# Patient Record
Sex: Female | Born: 1965 | Race: White | Hispanic: No | Marital: Single | Smoking: Never smoker
Health system: Southern US, Community
[De-identification: ages and names within clinical notes are randomized; demographics above are authoritative.]

## PROBLEM LIST (undated history)

## (undated) DIAGNOSIS — C240 Malignant neoplasm of extrahepatic bile duct: Secondary | ICD-10-CM

## (undated) DIAGNOSIS — T7840XA Allergy, unspecified, initial encounter: Secondary | ICD-10-CM

## (undated) HISTORY — PX: LUNG BIOPSY: SHX232

## (undated) HISTORY — DX: Allergy, unspecified, initial encounter: T78.40XA

---

## 2006-06-18 ENCOUNTER — Ambulatory Visit: Payer: Self-pay | Admitting: Family Medicine

## 2008-11-02 ENCOUNTER — Ambulatory Visit: Payer: Self-pay | Admitting: Family Medicine

## 2008-11-05 ENCOUNTER — Ambulatory Visit: Payer: Self-pay | Admitting: Family Medicine

## 2008-11-08 ENCOUNTER — Ambulatory Visit: Payer: Self-pay | Admitting: Internal Medicine

## 2008-11-08 ENCOUNTER — Ambulatory Visit: Payer: Self-pay | Admitting: Family Medicine

## 2008-11-20 ENCOUNTER — Ambulatory Visit: Payer: Self-pay | Admitting: General Surgery

## 2008-11-21 ENCOUNTER — Ambulatory Visit: Payer: Self-pay | Admitting: General Surgery

## 2008-11-23 ENCOUNTER — Ambulatory Visit: Payer: Self-pay | Admitting: Internal Medicine

## 2009-02-23 DIAGNOSIS — D869 Sarcoidosis, unspecified: Secondary | ICD-10-CM

## 2009-02-23 HISTORY — DX: Sarcoidosis, unspecified: D86.9

## 2011-11-24 ENCOUNTER — Ambulatory Visit: Payer: Self-pay | Admitting: Family Medicine

## 2011-12-01 ENCOUNTER — Ambulatory Visit: Payer: Self-pay | Admitting: Family Medicine

## 2013-05-27 ENCOUNTER — Emergency Department: Payer: Self-pay | Admitting: Emergency Medicine

## 2013-05-27 LAB — COMPREHENSIVE METABOLIC PANEL
ALBUMIN: 3.6 g/dL (ref 3.4–5.0)
ALT: 21 U/L (ref 12–78)
ANION GAP: 9 (ref 7–16)
Alkaline Phosphatase: 53 U/L
BILIRUBIN TOTAL: 0.3 mg/dL (ref 0.2–1.0)
BUN: 19 mg/dL — AB (ref 7–18)
CALCIUM: 8.2 mg/dL — AB (ref 8.5–10.1)
Chloride: 109 mmol/L — ABNORMAL HIGH (ref 98–107)
Co2: 23 mmol/L (ref 21–32)
Creatinine: 1.22 mg/dL (ref 0.60–1.30)
EGFR (African American): >60
EGFR (Non-African Amer.): 52 — ABNORMAL LOW
GLUCOSE: 98 mg/dL (ref 65–99)
Osmolality: 283 (ref 275–301)
POTASSIUM: 3.3 mmol/L — AB (ref 3.5–5.1)
SGOT(AST): 28 U/L (ref 15–37)
SODIUM: 141 mmol/L (ref 136–145)
TOTAL PROTEIN: 7.3 g/dL (ref 6.4–8.2)

## 2013-05-27 LAB — ETHANOL
ETHANOL LVL: 319 mg/dL — AB
Ethanol %: 0.319 % (ref 0.000–0.080)

## 2013-05-27 LAB — ACETAMINOPHEN LEVEL

## 2013-05-27 LAB — CBC
HCT: 38.4 % (ref 35.0–47.0)
HGB: 12.7 g/dL (ref 12.0–16.0)
MCH: 32.2 pg (ref 26.0–34.0)
MCHC: 33.1 g/dL (ref 32.0–36.0)
MCV: 97 fL (ref 80–100)
Platelet: 207 10*3/uL (ref 150–440)
RBC: 3.95 10*6/uL (ref 3.80–5.20)
RDW: 13.3 % (ref 11.5–14.5)
WBC: 4.9 10*3/uL (ref 3.6–11.0)

## 2013-05-27 LAB — SALICYLATE LEVEL: Salicylates, Serum: <1.7 mg/dL

## 2013-05-27 LAB — HCG, QUANTITATIVE, PREGNANCY: Beta Hcg, Quant.: <1 m[IU]/mL — ABNORMAL LOW

## 2014-08-28 ENCOUNTER — Encounter: Payer: Self-pay | Admitting: Emergency Medicine

## 2014-08-28 ENCOUNTER — Ambulatory Visit
Admission: EM | Admit: 2014-08-28 | Discharge: 2014-08-28 | Disposition: A | Discharge status: Home / Self Care | Payer: BLUE CROSS/BLUE SHIELD | Attending: Family Medicine | Admitting: Family Medicine

## 2014-08-28 DIAGNOSIS — Z23 Encounter for immunization: Secondary | ICD-10-CM

## 2014-08-28 DIAGNOSIS — L03115 Cellulitis of right lower limb: Secondary | ICD-10-CM | POA: Diagnosis not present

## 2014-08-28 MED ORDER — CEFAZOLIN SODIUM 1 G IJ SOLR
1.0000 g | Freq: Once | INTRAMUSCULAR | Status: AC
  Administered 2014-08-28: 1 g via INTRAMUSCULAR

## 2014-08-28 MED ORDER — SULFAMETHOXAZOLE-TRIMETHOPRIM 800-160 MG PO TABS
1.0000 | ORAL_TABLET | Freq: Two times a day (BID) | ORAL | Status: DC
Start: 2014-08-28 — End: 2018-03-04

## 2014-08-28 MED ORDER — TETANUS-DIPHTH-ACELL PERTUSSIS 5-2.5-18.5 LF-MCG/0.5 IM SUSP
0.5000 mL | Freq: Once | INTRAMUSCULAR | Status: AC
  Administered 2014-08-28: 0.5 mL via INTRAMUSCULAR

## 2014-08-28 NOTE — ED Notes (Signed)
Patient states that she was working on her deck and piece of wood fell and hit her right lower leg.  Patient reports redness, swelling, and pain in her right lower leg.

## 2014-08-28 NOTE — Discharge Instructions (Signed)

## 2014-08-28 NOTE — ED Provider Notes (Signed)
CSN: [id number]     Arrival date & time 08/28/14  1620 History   First MD Initiated Contact with Patient 08/28/14 1655     Chief Complaint  Patient presents with  . Cellulitis  . Leg Pain  . Leg Swelling   (Consider location/radiation/quality/duration/timing/severity/associated sxs/prior Treatment) HPI Comments: 49 yo female with a 5 days h/o puncture wound to right lower leg (shin) area. Patient states that while working on her deck a piece of wood with a rusty nail fell and punctured her leg. Area now red and tender/painful.  Patient does not recall her last tetanus vaccine. Denies fevers, chills.   Patient is a 49 y.o. female presenting with leg pain. The history is provided by the patient.  Leg Pain   History reviewed. No pertinent past medical history. Past Surgical History  Procedure Laterality Date  . Cesarean section     History reviewed. No pertinent family history. History  Substance Use Topics  . Smoking status: Never Smoker   . Smokeless tobacco: Never Used  . Alcohol Use: Yes   OB History    No data available     Review of Systems  Allergies  Review of patient's allergies indicates no known allergies.  Home Medications   Prior to Admission medications   Medication Sig Start Date End Date Taking? Authorizing Provider  sulfamethoxazole-trimethoprim (BACTRIM DS,SEPTRA DS) 800-160 MG per tablet Take 1 tablet by mouth 2 (two) times daily. 08/28/14   Payton Mccallumrlando Arrion Broaddus, MD   BP 145/91 mmHg  Pulse 64  Temp(Src) 97.7 F (36.5 C) (Tympanic)  Resp 16  Ht 5\' 9"  (1.753 m)  Wt 204 lb (92.534 kg)  BMI 30.11 kg/m2  SpO2 100%  LMP 08/21/2014 (Approximate) Physical Exam  Constitutional: She appears well-developed and well-nourished. No distress.  Skin: She is not diaphoretic. There is erythema.  Right lower extremity neurovascularly intact; skin area 4x5 cm with tenderness, blanchable erythema and central puncture wound with mild purulent drainage  Nursing note and vitals  reviewed.   ED Course  Procedures (including critical care time) Labs Review Labs Reviewed - No data to display  Imaging Review No results found.   MDM   1. Cellulitis of right lower extremity     New Prescriptions   SULFAMETHOXAZOLE-TRIMETHOPRIM (BACTRIM DS,SEPTRA DS) 800-160 MG PER TABLET    Take 1 tablet by mouth 2 (two) times daily.  Plan: 1.  diagnosis reviewed with patient 2. rx as per orders; risks, benefits, potential side effects reviewed with patient 3. Patient given Ancef 1gm IM x 1 and tetanus vaccine in clinic 4. Recommend supportive treatment with elevation, warm compresses, monitor 5. F/u in 2-3 days with PCP or here for recheck or  prn if symptoms worsening  Payton Mccallumrlando Rowe Warman, MD 08/28/14 1728

## 2018-03-04 ENCOUNTER — Ambulatory Visit (INDEPENDENT_AMBULATORY_CARE_PROVIDER_SITE_OTHER): Payer: Self-pay | Admitting: Physician Assistant

## 2018-03-04 ENCOUNTER — Encounter: Payer: Self-pay | Admitting: Physician Assistant

## 2018-03-04 VITALS — BP 130/90 | HR 69 | Temp 98.1°F | Resp 16 | Ht 68.0 in | Wt 214.0 lb

## 2018-03-04 DIAGNOSIS — H6123 Impacted cerumen, bilateral: Secondary | ICD-10-CM

## 2018-03-04 DIAGNOSIS — H6691 Otitis media, unspecified, right ear: Secondary | ICD-10-CM

## 2018-03-04 DIAGNOSIS — R059 Cough, unspecified: Secondary | ICD-10-CM

## 2018-03-04 DIAGNOSIS — R05 Cough: Secondary | ICD-10-CM

## 2018-03-04 DIAGNOSIS — J0100 Acute maxillary sinusitis, unspecified: Secondary | ICD-10-CM

## 2018-03-04 MED ORDER — PSEUDOEPH-BROMPHEN-DM 30-2-10 MG/5ML PO SYRP
5.0000 mL | ORAL_SOLUTION | Freq: Four times a day (QID) | ORAL | 0 refills | Status: DC | PRN
Start: 2018-03-04 — End: 2019-02-27

## 2018-03-04 MED ORDER — FLUTICASONE PROPIONATE 50 MCG/ACT NA SUSP
2.0000 | Freq: Every day | NASAL | 0 refills | Status: DC
Start: 2018-03-04 — End: 2019-02-27

## 2018-03-04 MED ORDER — AMOXICILLIN-POT CLAVULANATE 875-125 MG PO TABS: 1.0000 | ORAL_TABLET | Freq: Two times a day (BID) | ORAL | 0 refills | Status: AC

## 2018-03-04 NOTE — Progress Notes (Signed)
Patient ID: Vicki Phillips DOB: 1965/07/23 AGE: 53 y.o. MRN: [id number]   PCP: No primary care provider on file.   Chief Complaint:  Chief Complaint  Patient presents with  . Sinusitis    3wk  . Nasal Congestion    3wk  . Otalgia    1wk     Subjective:    HPI:  Vicki Phillips is a 53 y.o. female presents for evaluation  Chief Complaint  Patient presents with  . Sinusitis    3wk  . Nasal Congestion    3wk  . Otalgia    4wk   53 year old female presents to [facility] with 4 week history of nasal congestion and bilateral maxillary sinus congestion. Persistent. Began with rhinorrhea and sneezing. Mucous was thin and clear; now thick and green. Past 1-2 weeks has developed chest congestion with coarse/productive cough. Associated wheezing. Cough more severe when exercising. Patient also reports one week history of clogged right ear. Muffled hearing. Regularly uses ear pods. Patient with previous history of cerumen impaction, required ear irrigation several years ago. Patient denies regular use of Q-tips; however, few days ago tried to clean right ear canal with Q-tip, no cerumen. Patient with seasonal allergies. Most severe in Spring season; pollen and grass. Takes Claritin daily. Does not use nasal sprays. Has never been prescribed Singulair. Denies previous history of recurrent otitis media or previous tympanoplasty procedure. Patient has taken OTC Allegra, Theraflu, and Tylenol this past month for current symptoms; no significant symptom improvement. Patient denies smoking history. Denies asthma diagnosis. Patient has been diagnosed with sarcoidosis; not regularly seen by pulmonologist or rheumatologist. Patient states her last antibiotic was most likely one year ago, for sinusitis. Patient states her PCP is [facility] in [place]; however, has not been seen since 2015. Patient states work does provide biometric physicals on-site annually; and all of her vital  signs and blood work (suspect cholesterol panel and glucose) have been WNL. Patient denies fever, sweats, chills, headache, ear discharge/drainage, tinnitus, sore throat, chest pain, SOB, nausea/vomiting, diarrhea.  A limited review of symptoms was performed, pertinent positives and negatives as mentioned in HPI.  The following portions of the patient's history were reviewed and updated as appropriate: allergies, current medications and past medical history.  There are no active problems to display for this patient.   No Known Allergies  Current Outpatient Medications on File Prior to Visit  Medication Sig Dispense Refill  . loratadine (CLARITIN) 10 MG tablet Take by mouth.     No current facility-administered medications on file prior to visit.        Objective:   Vitals:   03/04/18 1558  BP: 130/90  Pulse: 69  Resp: 16  Temp: 98.1 F (36.7 C)  SpO2: 98%     Wt Readings from Last 3 Encounters:  03/04/18 214 lb (97.1 kg)  08/28/14 204 lb (92.5 kg)    Physical Exam:   General Appearance:  Patient sitting comfortably on examination table. Conversational. Peri Jefferson self-historian. In no acute distress. Afebrile.   Head:  Normocephalic, without obvious abnormality, atraumatic  Eyes:  PERRL, conjunctiva/corneas clear, EOM's intact  Ears:  Bilateral ear canals with significant cerumen; obstruction of canal on right side. Left TM pearly shiny with good light reflex. No erythema or injection. Right TM unable to be visualized.  Nose: Nares normal, septum midline. Nasal mucosa with bilateral edema, scant clear rhinorrhea. No sinus tenderness with percussion/palpation.  Throat: Lips, mucosa, and tongue normal; teeth and gums normal.  Throat reveals no erythema. Tonsils with no enlargement or exudate.  Neck: Supple, symmetrical, trachea midline, no lymphadenopathy  Lungs:   Clear to auscultation bilaterally, respirations unlabored. Good aeration. No wheezing. Forced expiration triggers  coarse/junky sounding cough. No audible rhonchi.  Heart:  Regular rate and rhythm, S1 and S2 normal, no murmur, rub, or gallop  Extremities: Extremities normal, atraumatic, no cyanosis or edema  Pulses: 2+ and symmetric  Skin: Skin color, texture, turgor normal, no rashes or lesions  Lymph nodes: Cervical, supraclavicular, and axillary nodes normal  Neurologic: Normal    Assessment & Plan:    Exam findings, diagnosis etiology and medication use and indications reviewed with patient. Follow-Up and discharge instructions provided. No emergent/urgent issues found on exam.  Patient education was provided.   Patient verbalized understanding of information provided and agrees with plan of care (POC), all questions answered. The patient is advised to call or return to clinic if condition does not see an improvement in symptoms, or to seek the care of the closest emergency department if condition worsens with the below plan.    Patient underwent bilateral ear irrigation. Status post procedure, left ear canal free of ear wax. Canal without erythema or edema. Left TM still WNL. Right ear canal clear of ear wax. Canal without erythema or edema. Right TM with dull appearance, serous effusion, and injection. No evidence of perforation.  1. Excessive ear wax, bilateral  2. Acute non-recurrent maxillary sinusitis - fluticasone (FLONASE) 50 MCG/ACT nasal spray; Place 2 sprays into both nostrils daily.  Dispense: 16 g; Refill: 0  3. Acute right otitis media - amoxicillin-clavulanate (AUGMENTIN) 875-125 MG tablet; Take 1 tablet by mouth 2 (two) times daily for 7 days.  Dispense: 14 tablet; Refill: 0  4. Cough - brompheniramine-pseudoephedrine-DM 30-2-10 MG/5ML syrup; Take 5 mLs by mouth 4 (four) times daily as needed.  Dispense: 120 mL; Refill: 0   Patient with 4 week history of nasal congestion and sinus pressure. 2 week history of chest congestion and cough (in addition to continued sinus symptoms)  and right ear clogged/muffled hearing. Patient with seasonal allergies, sinusitis (due to duration, warrants antibiotic), and right AOM. Prescribed 7-day course of Augmentin, Flonase nasal spray, and Bromphed cough syrup. Advised patient follow-up with PCP or UC in one week if symptoms not improving, sooner with any worsening symptoms.   [name], MHS, PA-C Rulon Sera, MHS, PA-C Advanced Practice Provider [facility]  [address], [facility], [address]st Floor [place], [place] [address] (p):  [phone] [email or web address]

## 2018-03-04 NOTE — Patient Instructions (Addendum)
Thank you for choosing [facility] for your health care needs.  You have been diagnosed with sinusitis (a sinus infection). You have been prescribed an antibiotic, Augmentin.  You have also been prescribed a steroid nasal spray, Flonase, for nasal congestion and post-nasal drip and sinus pressure/congestion. You have also been prescribed Bromphed cough syrup, for cough, will also help with nasal and sinus congestion/pressure.  Increase fluids. Rest. May use saline nasal spray or NettiPot. May use over the counter Tylenol or ibuprofen for pain/discomfort.  You had significant amount of ear wax in both ear canals. You underwent ear irrigation (flushing of the ears) to remove the ear wax. You may use over the counter Debrox ear drops in the future, to help prevent accumulation of ear wax.  Hope you feel better soon! Follow-up with your family physician or urgent care in one week if symptoms not improving, sooner with any worsening symptoms.  Sinusitis, Adult Sinusitis is soreness and swelling (inflammation) of your sinuses. Sinuses are hollow spaces in the bones around your face. They are located:  Around your eyes.  In the middle of your forehead.  Behind your nose.  In your cheekbones. Your sinuses and nasal passages are lined with a fluid called mucus. Mucus drains out of your sinuses. Swelling can trap mucus in your sinuses. This lets germs (bacteria, virus, or fungus) grow, which leads to infection. Most of the time, this condition is caused by a virus. What are the causes? This condition is caused by:  Allergies.  Asthma.  Germs.  Things that block your nose or sinuses.  Growths in the nose (nasal polyps).  Chemicals or irritants in the air.  Fungus (rare). What increases the risk? You are more likely to develop this condition if:  You have a weak body defense system (immune system).  You do a lot of swimming or diving.  You use nasal sprays too much.  You  smoke. What are the signs or symptoms? The main symptoms of this condition are pain and a feeling of pressure around the sinuses. Other symptoms include:  Stuffy nose (congestion).  Runny nose (drainage).  Swelling and warmth in the sinuses.  Headache.  Toothache.  A cough that may get worse at night.  Mucus that collects in the throat or the back of the nose (postnasal drip).  Being unable to smell and taste.  Being very tired (fatigue).  A fever.  Sore throat.  Bad breath. How is this diagnosed? This condition is diagnosed based on:  Your symptoms.  Your medical history.  A physical exam.  Tests to find out if your condition is short-term (acute) or long-term (chronic). Your doctor may: ? Check your nose for growths (polyps). ? Check your sinuses using a tool that has a light (endoscope). ? Check for allergies or germs. ? Do imaging tests, such as an MRI or CT scan. How is this treated? Treatment for this condition depends on the cause and whether it is short-term or long-term.  If caused by a virus, your symptoms should go away on their own within 10 days. You may be given medicines to relieve symptoms. They include: ? Medicines that shrink swollen tissue in the nose. ? Medicines that treat allergies (antihistamines). ? A spray that treats swelling of the nostrils. ? Rinses that help get rid of thick mucus in your nose (nasal saline washes).  If caused by bacteria, your doctor may wait to see if you will get better without treatment. You may be  given antibiotic medicine if you have: ? A very bad infection. ? A weak body defense system.  If caused by growths in the nose, you may need to have surgery. Follow these instructions at home: Medicines  Take, use, or apply over-the-counter and prescription medicines only as told by your doctor. These may include nasal sprays.  If you were prescribed an antibiotic medicine, take it as told by your doctor. Do not  stop taking the antibiotic even if you start to feel better. Hydrate and humidify   Drink enough water to keep your pee (urine) pale yellow.  Use a cool mist humidifier to keep the humidity level in your home above 50%.  Breathe in steam for 10-15 minutes, 3-4 times a day, or as told by your doctor. You can do this in the bathroom while a hot shower is running.  Try not to spend time in cool or dry air. Rest  Rest as much as you can.  Sleep with your head raised (elevated).  Make sure you get enough sleep each night. General instructions   Put a warm, moist washcloth on your face 3-4 times a day, or as often as told by your doctor. This will help with discomfort.  Wash your hands often with soap and water. If there is no soap and water, use hand sanitizer.  Do not smoke. Avoid being around people who are smoking (secondhand smoke).  Keep all follow-up visits as told by your doctor. This is important. Contact a doctor if:  You have a fever.  Your symptoms get worse.  Your symptoms do not get better within 10 days. Get help right away if:  You have a very bad headache.  You cannot stop throwing up (vomiting).  You have very bad pain or swelling around your face or eyes.  You have trouble seeing.  You feel confused.  Your neck is stiff.  You have trouble breathing. Summary  Sinusitis is swelling of your sinuses. Sinuses are hollow spaces in the bones around your face.  This condition is caused by tissues in your nose that become inflamed or swollen. This traps germs. These can lead to infection.  If you were prescribed an antibiotic medicine, take it as told by your doctor. Do not stop taking it even if you start to feel better.  Keep all follow-up visits as told by your doctor. This is important. This information is not intended to replace advice given to you by your health care provider. Make sure you discuss any questions you have with your health care  provider. Document Released: 07/29/2007 Document Revised: 07/12/2017 Document Reviewed: 07/12/2017 Elsevier Interactive Patient Education  2019 [employer].

## 2018-03-07 ENCOUNTER — Telehealth: Payer: Self-pay | Admitting: Emergency Medicine

## 2018-03-07 NOTE — Telephone Encounter (Signed)
Left message following up on visit with [facility] 

## 2018-11-24 LAB — HM MAMMOGRAPHY

## 2019-02-27 ENCOUNTER — Encounter: Payer: Self-pay | Admitting: Emergency Medicine

## 2019-02-27 ENCOUNTER — Other Ambulatory Visit: Payer: Self-pay

## 2019-02-27 ENCOUNTER — Ambulatory Visit
Admission: EM | Admit: 2019-02-27 | Discharge: 2019-02-27 | Disposition: A | Discharge status: Home / Self Care | Payer: No Typology Code available for payment source | Attending: Internal Medicine | Admitting: Internal Medicine

## 2019-02-27 DIAGNOSIS — Z79899 Other long term (current) drug therapy: Secondary | ICD-10-CM | POA: Insufficient documentation

## 2019-02-27 DIAGNOSIS — Z20822 Contact with and (suspected) exposure to covid-19: Secondary | ICD-10-CM | POA: Diagnosis not present

## 2019-02-27 DIAGNOSIS — D869 Sarcoidosis, unspecified: Secondary | ICD-10-CM | POA: Insufficient documentation

## 2019-02-27 DIAGNOSIS — R05 Cough: Secondary | ICD-10-CM | POA: Diagnosis present

## 2019-02-27 DIAGNOSIS — R0602 Shortness of breath: Secondary | ICD-10-CM | POA: Diagnosis not present

## 2019-02-27 DIAGNOSIS — R059 Cough, unspecified: Secondary | ICD-10-CM

## 2019-02-27 NOTE — ED Provider Notes (Signed)
[facility]    CSN: [id number] Arrival date & time: 02/27/19  1611      History   Chief Complaint Chief Complaint  Patient presents with  . Cough  . Shortness of Breath    HPI Vicki Phillips is a 54 y.o. female with a history of sarcoidosis comes to urgent care with a 3-day history of nonproductive cough with some shortness of breath.  Patient has been out of work for 12 days and recently started having symptoms.  She denies any fever or chills.  No chest pain or chest pressure.  No nausea or vomiting.  Patient is requesting Covid testing prior to returning to work.  No sick contacts recently.   HPI  Past Medical History:  Diagnosis Date  . Sarcoidosis     There are no problems to display for this patient.   Past Surgical History:  Procedure Laterality Date  . CESAREAN SECTION    . LUNG BIOPSY      OB History   No obstetric history on file.      Home Medications    Prior to Admission medications   Medication Sig Start Date End Date Taking? Authorizing Provider  loratadine (CLARITIN) 10 MG tablet Take by mouth.   Yes [provider]  fluticasone (FLONASE) 50 MCG/ACT nasal spray Place 2 sprays into both nostrils daily. 03/04/18 02/27/19  Darlin Priestly, PA-C    Family History Family History  Problem Relation Age of Onset  . Stroke Mother   . Heart attack Father     Social History Social History   Tobacco Use  . Smoking status: Never Smoker  . Smokeless tobacco: Never Used  Substance Use Topics  . Alcohol use: Yes  . Drug use: No     Allergies   Patient has no known allergies.   Review of Systems Review of Systems  Constitutional: Negative for activity change, chills, fatigue and fever.  HENT: Positive for congestion.   Respiratory: Positive for cough. Negative for shortness of breath and wheezing.   Cardiovascular: Negative for chest pain and palpitations.  Gastrointestinal: Negative for diarrhea, nausea and  vomiting.  Musculoskeletal: Negative for arthralgias and myalgias.  Skin: Negative for color change and wound.     Physical Exam Triage Vital Signs ED Triage Vitals  Enc Vitals Group     BP 02/27/19 1629 (!) 145/90     Pulse Rate 02/27/19 1629 63     Resp 02/27/19 1629 18     Temp 02/27/19 1629 98.3 F (36.8 C)     Temp Source 02/27/19 1629 Oral     SpO2 02/27/19 1629 99 %     Weight 02/27/19 1624 190 lb (86.2 kg)     Height 02/27/19 1624 5\' 9"  (1.753 m)     Head Circumference --      Peak Flow --      Pain Score 02/27/19 1624 4     Pain Loc --      Pain Edu? --      Excl. in [place]? --    No data found.  Updated Vital Signs BP (!) 145/90 (BP Location: Right Arm)   Pulse 63   Temp 98.3 F (36.8 C) (Oral)   Resp 18   Ht 5\' 9"  (1.753 m)   Wt 86.2 kg   SpO2 99%   BMI 28.06 kg/m   Visual Acuity Right Eye Distance:   Left Eye Distance:   Bilateral Distance:    Right Eye Near:  Left Eye Near:    Bilateral Near:     Physical Exam Constitutional:      General: She is not in acute distress.    Appearance: She is well-developed. She is not ill-appearing.  Pulmonary:     Effort: Pulmonary effort is normal.     Breath sounds: Normal breath sounds. No decreased breath sounds, wheezing or rhonchi.  Neurological:     Mental Status: She is alert.      UC Treatments / Results  Labs (all labs ordered are listed, but only abnormal results are displayed) Labs Reviewed  NOVEL CORONAVIRUS, NAA (HOSP ORDER, SEND-OUT TO REF LAB; TAT 18-24 HRS)    EKG   Radiology No results found.  Procedures Procedures (including critical care time)  Medications Ordered in UC Medications - No data to display  Initial Impression / Assessment and Plan / UC Course  I have reviewed the triage vital signs and the nursing notes.  Pertinent labs & imaging results that were available during my care of the patient were reviewed by me and considered in my medical decision making (see  chart for details).     1.  Cough: COVID-19 testing. Patient is advised self isolate Test results are available If patient has worsening symptoms she is advised to return to urgent care to be reevaluated Final Clinical Impressions(s) / UC Diagnoses   Final diagnoses:  Cough   Discharge Instructions   None    ED Prescriptions    None     PDMP not reviewed this encounter.   Merrilee Jansky, MD 02/27/19 1723

## 2019-02-27 NOTE — ED Triage Notes (Signed)
Pt c/o cough, shortness of breath, "low grade fever", and fatigue. Started about 3 days ago. She states that her son had covid about a month ago.

## 2019-02-28 LAB — NOVEL CORONAVIRUS, NAA (HOSP ORDER, SEND-OUT TO REF LAB; TAT 18-24 HRS): SARS-CoV-2, NAA: NOT DETECTED

## 2019-06-27 ENCOUNTER — Ambulatory Visit: Payer: Self-pay | Admitting: Internal Medicine

## 2019-06-30 ENCOUNTER — Encounter: Payer: Self-pay | Admitting: Internal Medicine

## 2019-06-30 DIAGNOSIS — D869 Sarcoidosis, unspecified: Secondary | ICD-10-CM | POA: Insufficient documentation

## 2019-06-30 DIAGNOSIS — J309 Allergic rhinitis, unspecified: Secondary | ICD-10-CM | POA: Insufficient documentation

## 2019-06-30 DIAGNOSIS — G4700 Insomnia, unspecified: Secondary | ICD-10-CM | POA: Insufficient documentation

## 2019-07-03 ENCOUNTER — Encounter: Payer: Self-pay | Admitting: Internal Medicine

## 2019-07-03 ENCOUNTER — Other Ambulatory Visit: Payer: Self-pay

## 2019-07-03 ENCOUNTER — Ambulatory Visit (INDEPENDENT_AMBULATORY_CARE_PROVIDER_SITE_OTHER): Payer: No Typology Code available for payment source | Admitting: Internal Medicine

## 2019-07-03 VITALS — BP 128/78 | HR 75 | Temp 97.7°F | Ht 69.0 in | Wt 215.0 lb

## 2019-07-03 DIAGNOSIS — D869 Sarcoidosis, unspecified: Secondary | ICD-10-CM | POA: Diagnosis not present

## 2019-07-03 DIAGNOSIS — F5101 Primary insomnia: Secondary | ICD-10-CM | POA: Diagnosis not present

## 2019-07-03 DIAGNOSIS — J3089 Other allergic rhinitis: Secondary | ICD-10-CM | POA: Diagnosis not present

## 2019-07-03 NOTE — Progress Notes (Signed)
Date:  07/03/2019   Name:  Vicki Phillips   DOB:  1966-01-23   MRN:  [id number]   Chief Complaint: [place] (Had mammogram in [facility] in [place]. 11/2018) Menopause about 3 years ago - some hot flashes and mood changes which are much better now. Last Pap about 10 yrs ago. No prior colonoscopy. She needs to schedule a CPX for employer insurance discount  Mammogram 11/2018 Pap - ? Colonoscopy - none Immunization History  Administered Date(s) Administered  . Influenza-Unspecified 11/13/2012  . PFIZER SARS-COV-2 Vaccination 05/08/2019, 05/29/2019  . Tdap 08/28/2014    HPI  Lab Results  Component Value Date   CREATININE 1.22 05/27/2013   BUN 19 (H) 05/27/2013   NA 141 05/27/2013   K 3.3 (L) 05/27/2013   CL 109 (H) 05/27/2013   CO2 23 05/27/2013    Lab Results  Component Value Date   WBC 4.9 05/27/2013   HGB 12.7 05/27/2013   HCT 38.4 05/27/2013   MCV 97 05/27/2013   PLT 207 05/27/2013   Lab Results  Component Value Date   ALT 21 05/27/2013   AST 28 05/27/2013   ALKPHOS 53 05/27/2013   BILITOT 0.3 05/27/2013     Review of Systems  Constitutional: Negative for chills, fatigue and fever.  Respiratory: Negative for cough, chest tightness, shortness of breath and wheezing.   Cardiovascular: Negative for chest pain.  Genitourinary: Negative for menstrual problem.  Neurological: Negative for dizziness and headaches.  Psychiatric/Behavioral: Positive for sleep disturbance. Negative for dysphoric mood. The patient is not nervous/anxious.     Patient Active Problem List   Diagnosis Date Noted  . Allergic rhinitis 06/30/2019  . Insomnia 06/30/2019  . Sarcoid 06/30/2019    No Known Allergies  Past Surgical History:  Procedure Laterality Date  . CESAREAN SECTION     x 2  . LUNG BIOPSY      Social History   Tobacco Use  . Smoking status: Never Smoker  . Smokeless tobacco: Never Used  Substance Use Topics  . Alcohol use: Yes   Alcohol/week: 10.0 standard drinks    Types: 10 Standard drinks or equivalent per week  . Drug use: No     Medication list has been reviewed and updated.  Current Meds  Medication Sig  . loratadine (CLARITIN) 10 MG tablet Take by mouth.    PHQ 2/9 Scores 07/03/2019  PHQ - 2 Score 0  PHQ- 9 Score 5   GAD 7 : Generalized Anxiety Score 07/03/2019  Nervous, Anxious, on Edge 2  Control/stop worrying 2  Worry too much - different things 2  Trouble relaxing 3  Restless 2  Easily annoyed or irritable 0  Afraid - awful might happen 1  Total GAD 7 Score 12  Anxiety Difficulty Not difficult at all    BP Readings from Last 3 Encounters:  07/03/19 128/78  02/27/19 (!) 145/90  03/04/18 130/90    Physical Exam Constitutional:      Appearance: Normal appearance.  Cardiovascular:     Rate and Rhythm: Normal rate and regular rhythm.     Pulses: Normal pulses.     Heart sounds: No murmur.  Pulmonary:     Effort: Pulmonary effort is normal.  Musculoskeletal:     Cervical back: Normal range of motion.     Right lower leg: No edema.     Left lower leg: No edema.  Lymphadenopathy:     Cervical: No cervical adenopathy.  Skin:  General: Skin is warm.  Neurological:     Mental Status: She is alert.  Psychiatric:        Mood and Affect: Mood normal.        Behavior: Behavior normal.     Wt Readings from Last 3 Encounters:  07/03/19 215 lb (97.5 kg)  02/27/19 190 lb (86.2 kg)  03/04/18 214 lb (97.1 kg)    BP 128/78   Pulse 75   Temp 97.7 F (36.5 C) (Temporal)   Ht 5\' 9"  (1.753 m)   Wt 215 lb (97.5 kg)   SpO2 98%   BMI 31.75 kg/m   Assessment and Plan: 1. Environmental and seasonal allergies Continue Claritin daily as needed  2. Sarcoid Asymptomatic No recent evaluation done or indicated  3. Primary insomnia Has uses Ambien but does not need any medications at this time  HM - Mammogram has been done in the past year. She will return for CPX, Pap and labs in  the near future  Partially dictated using . Any errors are unintentional.  Animal nutritionist, MD [facility]  07/03/2019

## 2019-07-31 ENCOUNTER — Encounter: Payer: Self-pay | Admitting: Internal Medicine

## 2019-07-31 ENCOUNTER — Ambulatory Visit (INDEPENDENT_AMBULATORY_CARE_PROVIDER_SITE_OTHER): Payer: No Typology Code available for payment source | Admitting: Internal Medicine

## 2019-07-31 ENCOUNTER — Other Ambulatory Visit: Payer: Self-pay

## 2019-07-31 ENCOUNTER — Other Ambulatory Visit (HOSPITAL_COMMUNITY)
Admission: RE | Admit: 2019-07-31 | Discharge: 2019-07-31 | Disposition: A | Discharge status: Home / Self Care | Payer: No Typology Code available for payment source | Source: Ambulatory Visit | Attending: Internal Medicine | Admitting: Internal Medicine

## 2019-07-31 VITALS — BP 136/90 | HR 71 | Temp 98.1°F | Ht 69.0 in | Wt 213.0 lb

## 2019-07-31 DIAGNOSIS — Z1231 Encounter for screening mammogram for malignant neoplasm of breast: Secondary | ICD-10-CM

## 2019-07-31 DIAGNOSIS — Z1211 Encounter for screening for malignant neoplasm of colon: Secondary | ICD-10-CM

## 2019-07-31 DIAGNOSIS — Z124 Encounter for screening for malignant neoplasm of cervix: Secondary | ICD-10-CM

## 2019-07-31 DIAGNOSIS — Z0000 Encounter for general adult medical examination without abnormal findings: Secondary | ICD-10-CM

## 2019-07-31 LAB — POCT URINALYSIS DIPSTICK
Bilirubin, UA: NEGATIVE
Blood, UA: NEGATIVE
Glucose, UA: NEGATIVE
Ketones, UA: NEGATIVE
Leukocytes, UA: NEGATIVE
Nitrite, UA: NEGATIVE
Protein, UA: NEGATIVE
Spec Grav, UA: 1.015 (ref 1.010–1.025)
Urobilinogen, UA: 0.2 E.U./dL
pH, UA: 6 (ref 5.0–8.0)

## 2019-07-31 NOTE — Progress Notes (Signed)
Date:  07/31/2019   Name:  Vicki Phillips   DOB:  28-May-1965   MRN:  [id number]   Chief Complaint: Annual Exam (breast exam with pap / colonoscopy) Vicki Phillips is a 54 y.o. female who presents today for her Complete Annual Exam. She feels well. She reports exercising working at home. She reports she is sleeping well.   Mammogram  11/2018 Pap 2002 Colonoscopy - none Immunization History  Administered Date(s) Administered  . Influenza-Unspecified 11/13/2012  . PFIZER SARS-COV-2 Vaccination 05/08/2019, 05/29/2019  . Tdap 08/28/2014    HPI  Lab Results  Component Value Date   CREATININE 1.22 05/27/2013   BUN 19 (H) 05/27/2013   NA 141 05/27/2013   K 3.3 (L) 05/27/2013   CL 109 (H) 05/27/2013   CO2 23 05/27/2013   No results found for: CHOL, HDL, LDLCALC, LDLDIRECT, TRIG, CHOLHDL No results found for: TSH No results found for: HGBA1C Lab Results  Component Value Date   WBC 4.9 05/27/2013   HGB 12.7 05/27/2013   HCT 38.4 05/27/2013   MCV 97 05/27/2013   PLT 207 05/27/2013   Lab Results  Component Value Date   ALT 21 05/27/2013   AST 28 05/27/2013   ALKPHOS 53 05/27/2013   BILITOT 0.3 05/27/2013     Review of Systems  Constitutional: Negative for chills, fatigue and fever.  HENT: Negative for congestion, hearing loss, tinnitus, trouble swallowing and voice change.   Eyes: Negative for visual disturbance.  Respiratory: Negative for cough, chest tightness, shortness of breath and wheezing.   Cardiovascular: Negative for chest pain, palpitations and leg swelling.  Gastrointestinal: Negative for abdominal pain, constipation, diarrhea and vomiting.  Endocrine: Negative for polydipsia and polyuria.  Genitourinary: Negative for dysuria, frequency, genital sores, vaginal bleeding and vaginal discharge.  Musculoskeletal: Positive for arthralgias (knee pain). Negative for gait problem and joint swelling.  Skin: Negative for color change and rash.    Allergic/Immunologic: Positive for environmental allergies.  Neurological: Negative for dizziness, tremors, light-headedness and headaches.  Hematological: Negative for adenopathy. Does not bruise/bleed easily.  Psychiatric/Behavioral: Negative for dysphoric mood and sleep disturbance. The patient is not nervous/anxious.     Patient Active Problem List   Diagnosis Date Noted  . Allergic rhinitis 06/30/2019  . Insomnia 06/30/2019  . Sarcoid 06/30/2019    No Known Allergies  Past Surgical History:  Procedure Laterality Date  . CESAREAN SECTION     x 2  . LUNG BIOPSY      Social History   Tobacco Use  . Smoking status: Never Smoker  . Smokeless tobacco: Never Used  Substance Use Topics  . Alcohol use: Yes    Alcohol/week: 10.0 standard drinks    Types: 10 Standard drinks or equivalent per week  . Drug use: No     Medication list has been reviewed and updated.  Current Meds  Medication Sig  . loratadine (CLARITIN) 10 MG tablet Take by mouth.  . pseudoephedrine-acetaminophen (TYLENOL SINUS) 30-500 MG TABS tablet Take 1 tablet by mouth in the morning and at bedtime.    PHQ 2/9 Scores 07/31/2019 07/03/2019  PHQ - 2 Score 0 0  PHQ- 9 Score 2 5   GAD 7 : Generalized Anxiety Score 07/31/2019 07/03/2019  Nervous, Anxious, on Edge 0 2  Control/stop worrying 1 2  Worry too much - different things 1 2  Trouble relaxing 1 3  Restless 1 2  Easily annoyed or irritable 0 0  Afraid - awful might happen 0  1  Total GAD 7 Score 4 12  Anxiety Difficulty Not difficult at all Not difficult at all    BP Readings from Last 3 Encounters:  07/31/19 136/90  07/03/19 128/78  02/27/19 (!) 145/90    Physical Exam Vitals and nursing note reviewed.  Constitutional:      General: She is not in acute distress.    Appearance: She is well-developed.  HENT:     Head: Normocephalic and atraumatic.     Right Ear: Tympanic membrane and ear canal normal.     Left Ear: Tympanic membrane and  ear canal normal.     Nose:     Right Sinus: No maxillary sinus tenderness.     Left Sinus: No maxillary sinus tenderness.  Eyes:     General: No scleral icterus.       Right eye: No discharge.        Left eye: No discharge.     Conjunctiva/sclera: Conjunctivae normal.  Neck:     Thyroid: No thyromegaly.     Vascular: No carotid bruit.  Cardiovascular:     Rate and Rhythm: Normal rate and regular rhythm.     Pulses: Normal pulses.     Heart sounds: Normal heart sounds.  Pulmonary:     Effort: Pulmonary effort is normal. No respiratory distress.     Breath sounds: No wheezing.  Chest:     Breasts:        Right: No mass, nipple discharge, skin change or tenderness.        Left: No mass, nipple discharge, skin change or tenderness.  Abdominal:     General: Bowel sounds are normal.     Palpations: Abdomen is soft.     Tenderness: There is no abdominal tenderness.  Genitourinary:    Labia:        Right: No tenderness, lesion or injury.        Left: No tenderness, lesion or injury.      Vagina: Normal.     Cervix: Normal.     Uterus: Normal.      Adnexa: Right adnexa normal and left adnexa normal.  Musculoskeletal:        General: Normal range of motion.     Cervical back: Normal range of motion. No erythema.  Lymphadenopathy:     Cervical: No cervical adenopathy.  Skin:    General: Skin is warm and dry.     Findings: No rash.  Neurological:     Mental Status: She is alert and oriented to person, place, and time.     Cranial Nerves: No cranial nerve deficit.     Sensory: No sensory deficit.     Deep Tendon Reflexes: Reflexes are normal and symmetric.  Psychiatric:        Speech: Speech normal.        Behavior: Behavior normal.        Thought Content: Thought content normal.     Wt Readings from Last 3 Encounters:  07/31/19 213 lb (96.6 kg)  07/03/19 215 lb (97.5 kg)  02/27/19 190 lb (86.2 kg)    BP 136/90   Pulse 71   Temp 98.1 F (36.7 C) (Oral)   Ht 5\' 9"   (1.753 m)   Wt 213 lb (96.6 kg)   SpO2 98%   BMI 31.45 kg/m   Assessment and Plan: 1. Annual physical exam Normal exam except for weight Continue to work on diet, resume regular exercise - CBC with Differential/Platelet -  Comprehensive metabolic panel - Lipid panel - TSH - POCT urinalysis dipstick  2. Encounter for screening mammogram for breast cancer To be scheduled at [facility] - MM 3D SCREEN BREAST BILATERAL; Future  3. Encounter for screening for cervical cancer  Pap obtained; exam normal - Cytology - PAP  4. Colon cancer screening - Ambulatory referral to Gastroenterology   Partially dictated using Dragon software. Any errors are unintentional.  Bari Edward, MD [facility]  07/31/2019

## 2019-08-01 LAB — LIPID PANEL
Chol/HDL Ratio: 2.8 ratio (ref 0.0–4.4)
Cholesterol, Total: 225 mg/dL — ABNORMAL HIGH (ref 100–199)
HDL: 80 mg/dL (ref >39)
LDL Chol Calc (NIH): 133 mg/dL — ABNORMAL HIGH (ref 0–99)
Triglycerides: 70 mg/dL (ref 0–149)
VLDL Cholesterol Cal: 12 mg/dL (ref 5–40)

## 2019-08-01 LAB — CBC WITH DIFFERENTIAL/PLATELET
Basophils Absolute: 0 10*3/uL (ref 0.0–0.2)
Basos: 1 %
EOS (ABSOLUTE): 0.2 10*3/uL (ref 0.0–0.4)
Eos: 4 %
Hematocrit: 42.3 % (ref 34.0–46.6)
Hemoglobin: 13.8 g/dL (ref 11.1–15.9)
Immature Grans (Abs): 0 10*3/uL (ref 0.0–0.1)
Immature Granulocytes: 0 %
Lymphocytes Absolute: 0.7 10*3/uL (ref 0.7–3.1)
Lymphs: 21 %
MCH: 31.2 pg (ref 26.6–33.0)
MCHC: 32.6 g/dL (ref 31.5–35.7)
MCV: 96 fL (ref 79–97)
Monocytes Absolute: 0.3 10*3/uL (ref 0.1–0.9)
Monocytes: 10 %
Neutrophils Absolute: 2.2 10*3/uL (ref 1.4–7.0)
Neutrophils: 64 %
Platelets: 241 10*3/uL (ref 150–450)
RBC: 4.42 x10E6/uL (ref 3.77–5.28)
RDW: 13 % (ref 11.7–15.4)
WBC: 3.5 10*3/uL (ref 3.4–10.8)

## 2019-08-01 LAB — COMPREHENSIVE METABOLIC PANEL
ALT: 15 IU/L (ref 0–32)
AST: 22 IU/L (ref 0–40)
Albumin/Globulin Ratio: 2.1 (ref 1.2–2.2)
Albumin: 4.8 g/dL (ref 3.8–4.9)
Alkaline Phosphatase: 72 IU/L (ref 48–121)
BUN/Creatinine Ratio: 26 — ABNORMAL HIGH (ref 9–23)
BUN: 19 mg/dL (ref 6–24)
Bilirubin Total: 0.3 mg/dL (ref 0.0–1.2)
CO2: 22 mmol/L (ref 20–29)
Calcium: 9.6 mg/dL (ref 8.7–10.2)
Chloride: 100 mmol/L (ref 96–106)
Creatinine, Ser: 0.73 mg/dL (ref 0.57–1.00)
GFR calc Af Amer: 108 mL/min/{1.73_m2} (ref >59)
GFR calc non Af Amer: 94 mL/min/{1.73_m2} (ref >59)
Globulin, Total: 2.3 g/dL (ref 1.5–4.5)
Glucose: 103 mg/dL — ABNORMAL HIGH (ref 65–99)
Potassium: 4.4 mmol/L (ref 3.5–5.2)
Sodium: 136 mmol/L (ref 134–144)
Total Protein: 7.1 g/dL (ref 6.0–8.5)

## 2019-08-01 LAB — CYTOLOGY - PAP
Comment: NEGATIVE
Diagnosis: NEGATIVE
High risk HPV: NEGATIVE

## 2019-08-01 LAB — TSH: TSH: 2.23 u[IU]/mL (ref 0.450–4.500)

## 2019-08-03 ENCOUNTER — Other Ambulatory Visit: Payer: Self-pay

## 2019-08-03 ENCOUNTER — Telehealth (INDEPENDENT_AMBULATORY_CARE_PROVIDER_SITE_OTHER): Payer: Self-pay | Admitting: Gastroenterology

## 2019-08-03 ENCOUNTER — Encounter: Payer: Self-pay | Admitting: Gastroenterology

## 2019-08-03 DIAGNOSIS — Z1211 Encounter for screening for malignant neoplasm of colon: Secondary | ICD-10-CM

## 2019-08-03 MED ORDER — NA SULFATE-K SULFATE-MG SULF 17.5-3.13-1.6 GM/177ML PO SOLN: 1.0000 | Freq: Once | ORAL | 0 refills | Status: AC

## 2019-08-03 NOTE — Progress Notes (Signed)
Gastroenterology Pre-Procedure Review  Request Date: Thursday 08/10/19 Requesting Physician: Dr. Wohl  PATIENT REVIEW QUESTIONS: The patient responded to the following health history questions as indicated:    1. Are you having any GI issues? no 2. Do you have a personal history of Polyps? no 3. Do you have a family history of Colon Cancer or Polyps? no 4. Diabetes Mellitus? no 5. Joint replacements in the past 12 months?no 6. Major health problems in the past 3 months?no 7. Any artificial heart valves, MVP, or defibrillator?no    MEDICATIONS & ALLERGIES:    Patient reports the following regarding taking any anticoagulation/antiplatelet therapy:   Plavix, Coumadin, Eliquis, Xarelto, Lovenox, Pradaxa, Brilinta, or Effient? no Aspirin? no  Patient confirms/reports the following medications:  Current Outpatient Medications  Medication Sig Dispense Refill  . loratadine (CLARITIN) 10 MG tablet Take by mouth.    . pseudoephedrine-acetaminophen (TYLENOL SINUS) 30-500 MG TABS tablet Take 1 tablet by mouth in the morning and at bedtime.    . Na Sulfate-K Sulfate-Mg Sulf 17.5-3.13-1.6 GM/177ML SOLN Take 1 kit by mouth once for 1 dose. 354 mL 0   No current facility-administered medications for this visit.    Patient confirms/reports the following allergies:  No Known Allergies  No orders of the defined types were placed in this encounter.   AUTHORIZATION INFORMATION Primary Insurance: 1D#: Group #:  Secondary Insurance: 1D#: Group #:  SCHEDULE INFORMATION: Date: Thursday 08/10/19 Time: Location:[facility] 

## 2019-08-08 ENCOUNTER — Other Ambulatory Visit: Payer: Self-pay

## 2019-08-08 ENCOUNTER — Other Ambulatory Visit
Admission: RE | Admit: 2019-08-08 | Discharge: 2019-08-08 | Disposition: A | Discharge status: Home / Self Care | Payer: PRIVATE HEALTH INSURANCE | Source: Ambulatory Visit | Attending: Gastroenterology | Admitting: Gastroenterology

## 2019-08-08 DIAGNOSIS — Z01812 Encounter for preprocedural laboratory examination: Secondary | ICD-10-CM | POA: Diagnosis not present

## 2019-08-08 DIAGNOSIS — Z20822 Contact with and (suspected) exposure to covid-19: Secondary | ICD-10-CM | POA: Insufficient documentation

## 2019-08-09 LAB — SARS CORONAVIRUS 2 (TAT 6-24 HRS): SARS Coronavirus 2: NEGATIVE

## 2019-08-09 NOTE — Discharge Instructions (Signed)
General Anesthesia, Adult, Care After This sheet gives you information about how to care for yourself after your procedure. Your health care provider may also give you more specific instructions. If you have problems or questions, contact your health care provider. What can I expect after the procedure? After the procedure, the following side effects are common:  Pain or discomfort at the IV site.  Nausea.  Vomiting.  Sore throat.  Trouble concentrating.  Feeling cold or chills.  Weak or tired.  Sleepiness and fatigue.  Soreness and body aches. These side effects can affect parts of the body that were not involved in surgery. Follow these instructions at home:  For at least 24 hours after the procedure:  Have a responsible adult stay with you. It is important to have someone help care for you until you are awake and alert.  Rest as needed.  Do not: ? Participate in activities in which you could fall or become injured. ? Drive. ? Use heavy machinery. ? Drink alcohol. ? Take sleeping pills or medicines that cause drowsiness. ? Make important decisions or sign legal documents. ? Take care of children on your own. Eating and drinking  Follow any instructions from your health care provider about eating or drinking restrictions.  When you feel hungry, start by eating small amounts of foods that are soft and easy to digest (bland), such as toast. Gradually return to your regular diet.  Drink enough fluid to keep your urine pale yellow.  If you vomit, rehydrate by drinking water, juice, or clear broth. General instructions  If you have sleep apnea, surgery and certain medicines can increase your risk for breathing problems. Follow instructions from your health care provider about wearing your sleep device: ? Anytime you are sleeping, including during daytime naps. ? While taking prescription pain medicines, sleeping medicines, or medicines that make you drowsy.  Return to  your normal activities as told by your health care provider. Ask your health care provider what activities are safe for you.  Take over-the-counter and prescription medicines only as told by your health care provider.  If you smoke, do not smoke without supervision.  Keep all follow-up visits as told by your health care provider. This is important. Contact a health care provider if:  You have nausea or vomiting that does not get better with medicine.  You cannot eat or drink without vomiting.  You have pain that does not get better with medicine.  You are unable to pass urine.  You develop a skin rash.  You have a fever.  You have redness around your IV site that gets worse. Get help right away if:  You have difficulty breathing.  You have chest pain.  You have blood in your urine or stool, or you vomit blood. Summary  After the procedure, it is common to have a sore throat or nausea. It is also common to feel tired.  Have a responsible adult stay with you for the first 24 hours after general anesthesia. It is important to have someone help care for you until you are awake and alert.  When you feel hungry, start by eating small amounts of foods that are soft and easy to digest (bland), such as toast. Gradually return to your regular diet.  Drink enough fluid to keep your urine pale yellow.  Return to your normal activities as told by your health care provider. Ask your health care provider what activities are safe for you. This information is not   intended to replace advice given to you by your health care provider. Make sure you discuss any questions you have with your health care provider. Document Revised: 02/12/2017 Document Reviewed: 09/25/2016 Elsevier Patient Education  2020 Elsevier Inc.  

## 2019-08-10 ENCOUNTER — Ambulatory Visit: Payer: PRIVATE HEALTH INSURANCE | Admitting: Anesthesiology

## 2019-08-10 ENCOUNTER — Encounter: Payer: Self-pay | Admitting: Gastroenterology

## 2019-08-10 ENCOUNTER — Encounter
Admission: RE | Disposition: A | Discharge status: Home / Self Care | Payer: Self-pay | Source: Home / Self Care | Attending: Gastroenterology

## 2019-08-10 ENCOUNTER — Ambulatory Visit
Admission: RE | Admit: 2019-08-10 | Discharge: 2019-08-10 | Disposition: A | Discharge status: Home / Self Care | Payer: PRIVATE HEALTH INSURANCE | Attending: Gastroenterology | Admitting: Gastroenterology

## 2019-08-10 DIAGNOSIS — Z859 Personal history of malignant neoplasm, unspecified: Secondary | ICD-10-CM | POA: Insufficient documentation

## 2019-08-10 DIAGNOSIS — K5730 Diverticulosis of large intestine without perforation or abscess without bleeding: Secondary | ICD-10-CM | POA: Insufficient documentation

## 2019-08-10 DIAGNOSIS — Z79899 Other long term (current) drug therapy: Secondary | ICD-10-CM | POA: Diagnosis not present

## 2019-08-10 DIAGNOSIS — Z1211 Encounter for screening for malignant neoplasm of colon: Secondary | ICD-10-CM | POA: Insufficient documentation

## 2019-08-10 DIAGNOSIS — Z8249 Family history of ischemic heart disease and other diseases of the circulatory system: Secondary | ICD-10-CM | POA: Diagnosis not present

## 2019-08-10 DIAGNOSIS — K641 Second degree hemorrhoids: Secondary | ICD-10-CM | POA: Diagnosis not present

## 2019-08-10 DIAGNOSIS — Z823 Family history of stroke: Secondary | ICD-10-CM | POA: Insufficient documentation

## 2019-08-10 DIAGNOSIS — Z841 Family history of disorders of kidney and ureter: Secondary | ICD-10-CM | POA: Insufficient documentation

## 2019-08-10 HISTORY — PX: COLONOSCOPY WITH PROPOFOL: SHX5780

## 2019-08-10 SURGERY — COLONOSCOPY WITH PROPOFOL
Anesthesia: General

## 2019-08-10 MED ORDER — STERILE WATER FOR IRRIGATION IR SOLN: Status: DC | PRN

## 2019-08-10 MED ORDER — LIDOCAINE HCL (CARDIAC) PF 100 MG/5ML IV SOSY
PREFILLED_SYRINGE | INTRAVENOUS | Status: DC | PRN
  Administered 2019-08-10: 40 mg via INTRAVENOUS

## 2019-08-10 MED ORDER — PROPOFOL 10 MG/ML IV BOLUS
INTRAVENOUS | Status: DC | PRN
  Administered 2019-08-10 (×5): 20 mg via INTRAVENOUS
  Administered 2019-08-10: 100 mg via INTRAVENOUS

## 2019-08-10 MED ORDER — LACTATED RINGERS IV SOLN: INTRAVENOUS | Status: DC

## 2019-08-10 SURGICAL SUPPLY — 24 items
CLIP HMST 235XBRD CATH ROT (MISCELLANEOUS) IMPLANT
CLIP RESOLUTION 360 11X235 (MISCELLANEOUS)
ELECT REM PT RETURN 9FT ADLT (ELECTROSURGICAL)
ELECTRODE REM PT RTRN 9FT ADLT (ELECTROSURGICAL) IMPLANT
FCP ESCP3.2XJMB 240X2.8X (MISCELLANEOUS)
FORCEPS BIOP RAD 4 LRG CAP 4 (CUTTING FORCEPS) IMPLANT
FORCEPS BIOP RJ4 240 W/NDL (MISCELLANEOUS)
FORCEPS ESCP3.2XJMB 240X2.8X (MISCELLANEOUS) IMPLANT
GOWN CVR UNV OPN BCK APRN NK (MISCELLANEOUS) ×2 IMPLANT
GOWN ISOL THUMB LOOP REG UNIV (MISCELLANEOUS) ×4
INJECTOR VARIJECT VIN23 (MISCELLANEOUS) IMPLANT
KIT DEFENDO VALVE AND CONN (KITS) IMPLANT
KIT ENDO PROCEDURE OLY (KITS) ×3 IMPLANT
MANIFOLD NEPTUNE II (INSTRUMENTS) ×3 IMPLANT
MARKER SPOT ENDO TATTOO 5ML (MISCELLANEOUS) IMPLANT
PROBE APC STR FIRE (PROBE) IMPLANT
RETRIEVER NET ROTH 2.5X230 LF (MISCELLANEOUS) IMPLANT
SNARE SHORT THROW 13M SML OVAL (MISCELLANEOUS) IMPLANT
SNARE SHORT THROW 30M LRG OVAL (MISCELLANEOUS) IMPLANT
SNARE SNG USE RND 15MM (INSTRUMENTS) IMPLANT
SPOT EX ENDOSCOPIC TATTOO (MISCELLANEOUS)
TRAP ETRAP POLY (MISCELLANEOUS) IMPLANT
VARIJECT INJECTOR VIN23 (MISCELLANEOUS)
WATER STERILE IRR 250ML POUR (IV SOLUTION) ×3 IMPLANT

## 2019-08-10 NOTE — Anesthesia Preprocedure Evaluation (Signed)
Anesthesia Evaluation  Patient identified by MRN, date of birth, ID band Patient awake    Reviewed: Allergy & Precautions, H&P , NPO status , Patient's Chart, lab work & pertinent test results, reviewed documented beta blocker date and time   Airway Mallampati: II  TM Distance: >3 FB Neck ROM: full    Dental no notable dental hx.    Pulmonary  sarcoidosis   Pulmonary exam normal breath sounds clear to auscultation       Cardiovascular Exercise Tolerance: Good negative cardio ROS   Rhythm:regular Rate:Normal     Neuro/Psych negative neurological ROS  negative psych ROS   GI/Hepatic negative GI ROS, Neg liver ROS,   Endo/Other  negative endocrine ROS  Renal/GU negative Renal ROS  negative genitourinary   Musculoskeletal   Abdominal   Peds  Hematology negative hematology ROS (+)   Anesthesia Other Findings   Reproductive/Obstetrics negative OB ROS                             Anesthesia Physical Anesthesia Plan  ASA: II  Anesthesia Plan: General   Post-op Pain Management:    Induction:   PONV Risk Score and Plan: 3 and Propofol infusion, TIVA and Treatment may vary due to age or medical condition  Airway Management Planned:   Additional Equipment:   Intra-op Plan:   Post-operative Plan:   Informed Consent: I have reviewed the patients History and Physical, chart, labs and discussed the procedure including the risks, benefits and alternatives for the proposed anesthesia with the patient or authorized representative who has indicated his/her understanding and acceptance.     Dental Advisory Given  Plan Discussed with: CRNA  Anesthesia Plan Comments:         Anesthesia Quick Evaluation

## 2019-08-10 NOTE — Anesthesia Procedure Notes (Signed)
Procedure Name: MAC Date/Time: 08/10/2019 8:29 AM Performed by: Vanetta Shawl, CRNA Pre-anesthesia Checklist: Patient identified, Emergency Drugs available, Suction available, Timeout performed and Patient being monitored Patient Re-evaluated:Patient Re-evaluated prior to induction Oxygen Delivery Method: Nasal cannula Placement Confirmation: positive ETCO2

## 2019-08-10 NOTE — Transfer of Care (Signed)
Immediate Anesthesia Transfer of Care Note  Patient: Vicki Phillips  Procedure(s) Performed: COLONOSCOPY WITH PROPOFOL (N/A )  Patient Location: PACU  Anesthesia Type: General  Level of Consciousness: awake, alert  and patient cooperative  Airway and Oxygen Therapy: Patient Spontanous Breathing   Post-op Assessment: Post-op Vital signs reviewed, Patient's Cardiovascular Status Stable, Respiratory Function Stable, Patent Airway and No signs of Nausea or vomiting  Post-op Vital Signs: Reviewed and stable  Complications: No complications documented.

## 2019-08-10 NOTE — H&P (Signed)
Vicki Minium, MD [facility] [address], [address] [place], [place] [address] Phone: [phone] Fax : [phone]  Primary Care Physician:  Reubin Milan, MD Primary Gastroenterologist:  Dr. Servando Snare  Pre-Procedure History & Physical: HPI:  Vicki Phillips is a 54 y.o. female is here for a screening colonoscopy.   Past Medical History:  Diagnosis Date  . Allergy   . Sarcoidosis 2011    Past Surgical History:  Procedure Laterality Date  . CESAREAN SECTION     x 2  . LUNG BIOPSY      Prior to Admission medications   Medication Sig Start Date End Date Taking? Authorizing Provider  loratadine (CLARITIN) 10 MG tablet Take by mouth.   Yes [provider]  pseudoephedrine-acetaminophen (TYLENOL SINUS) 30-500 MG TABS tablet Take 1 tablet by mouth in the morning and at bedtime.   Yes [provider]  fluticasone (FLONASE) 50 MCG/ACT nasal spray Place 2 sprays into both nostrils daily. 03/04/18 02/27/19  [name], PA-C    Allergies as of 08/03/2019  . (No Known Allergies)    Family History  Problem Relation Age of Onset  . Stroke Mother   . Heart disease Mother   . Heart attack Father 46  . Heart disease Father   . Kidney disease Sister     Social History   Socioeconomic History  . Marital status: Single    Spouse name: Not on file  . Number of children: 2  . Years of education: Not on file  . Highest education level: Not on file  Occupational History  . Not on file  Tobacco Use  . Smoking status: Never Smoker  . Smokeless tobacco: Never Used  Vaping Use  . Vaping Use: Never used  Substance and Sexual Activity  . Alcohol use: Yes    Alcohol/week: 10.0 standard drinks    Types: 10 Standard drinks or equivalent per week  . Drug use: No  . Sexual activity: Yes    Partners: Male    Birth control/protection: Surgical  Other Topics Concern  . Not on file  Social History Narrative  . Not on file   Social Determinants of Health    Financial Resource Strain:   . Difficulty of Paying Living Expenses:   Food Insecurity:   . Worried About Programme researcher, broadcasting/film/video in the Last Year:   . Barista in the Last Year:   Transportation Needs:   . Freight forwarder (Medical):   Marland Kitchen Lack of Transportation (Non-Medical):   Physical Activity:   . Days of Exercise per Week:   . Minutes of Exercise per Session:   Stress:   . Feeling of Stress :   Social Connections:   . Frequency of Communication with Friends and Family:   . Frequency of Social Gatherings with Friends and Family:   . Attends Religious Services:   . Active Member of Clubs or Organizations:   . Attends Banker Meetings:   Marland Kitchen Marital Status:   Intimate Partner Violence:   . Fear of Current or Ex-Partner:   . Emotionally Abused:   Marland Kitchen Physically Abused:   . Sexually Abused:     Review of Systems: See HPI, otherwise negative ROS  Physical Exam: BP 122/90   Pulse 67   Temp 98.1 F (36.7 C) (Temporal)   Resp 16   Ht 5\' 9"  (1.753 m)   Wt 93.4 kg   LMP 08/21/2014 (Approximate)   SpO2 98%  BMI 30.42 kg/m  General:   Alert,  pleasant and cooperative in NAD Head:  Normocephalic and atraumatic. Neck:  Supple; no masses or thyromegaly. Lungs:  Clear throughout to auscultation.    Heart:  Regular rate and rhythm. Abdomen:  Soft, nontender and nondistended. Normal bowel sounds, without guarding, and without rebound.   Neurologic:  Alert and  oriented x4;  grossly normal neurologically.  Impression/Plan: [name] is now here to undergo a screening colonoscopy.  Risks, benefits, and alternatives regarding colonoscopy have been reviewed with the patient.  Questions have been answered.  All parties agreeable.

## 2019-08-10 NOTE — Op Note (Signed)
[facility] Gastroenterology Patient Name: Vicki Phillips Procedure Date: 08/10/2019 8:19 AM MRN: [id number] Account #: [id number] Date of Birth: September 12, 1965 Admit Type: Outpatient Age: 54 Room: [facility] OR ROOM 01 Gender: Female Note Status: Finalized Procedure:             Colonoscopy Indications:           Screening for colorectal malignant neoplasm Providers:             Midge Minium MD, MD Referring MD:          Bari Edward, MD (Referring MD) Medicines:             Propofol per Anesthesia Complications:         No immediate complications. Procedure:             Pre-Anesthesia Assessment:                        - Prior to the procedure, a History and Physical was                         performed, and patient medications and allergies were                         reviewed. The patient's tolerance of previous                         anesthesia was also reviewed. The risks and benefits                         of the procedure and the sedation options and risks                         were discussed with the patient. All questions were                         answered, and informed consent was obtained. Prior                         Anticoagulants: The patient has taken no previous                         anticoagulant or antiplatelet agents. ASA Grade                         Assessment: II - A patient with mild systemic disease.                         After reviewing the risks and benefits, the patient                         was deemed in satisfactory condition to undergo the                         procedure.                        After obtaining informed consent, the colonoscope was  passed under direct vision. Throughout the procedure,                         the patient's blood pressure, pulse, and oxygen                         saturations were monitored continuously. The was                         introduced through the anus  and advanced to the the                         cecum, identified by appendiceal orifice and ileocecal                         valve. The colonoscopy was performed without                         difficulty. The patient tolerated the procedure well.                         The quality of the bowel preparation was excellent. Findings:      The perianal and digital rectal examinations were normal.      Multiple small-mouthed diverticula were found in the sigmoid colon.      Non-bleeding internal hemorrhoids were found during retroflexion. The       hemorrhoids were Grade II (internal hemorrhoids that prolapse but reduce       spontaneously). Impression:            - Diverticulosis in the sigmoid colon.                        - Non-bleeding internal hemorrhoids.                        - No specimens collected. Recommendation:        - Discharge patient to home.                        - Resume previous diet.                        - Continue present medications.                        - Repeat colonoscopy in 10 years for screening unless                         any change in family history or lower GI problems. Procedure Code(s):     --- Professional ---                        [phone], Colonoscopy, flexible; diagnostic, including                         collection of specimen(s) by brushing or washing, when                         performed (separate procedure) Diagnosis Code(s):     --- Professional ---  Z12.11, Encounter for screening for malignant neoplasm                         of colon CPT copyright 2019 American Medical Association. All rights reserved. The codes documented in this report are preliminary and upon coder review may  be revised to meet current compliance requirements. Midge Minium MD, MD 08/10/2019 8:43:43 AM This report has been signed electronically. Number of Addenda: 0 Note Initiated On: 08/10/2019 8:19 AM Scope Withdrawal Time: 0 hours 7  minutes 44 seconds  Total Procedure Duration: 0 hours 10 minutes 29 seconds  Estimated Blood Loss:  Estimated blood loss: none.      [facility]

## 2019-08-10 NOTE — Anesthesia Postprocedure Evaluation (Signed)
Anesthesia Post Note  Patient: Vicki Phillips  Procedure(s) Performed: COLONOSCOPY WITH PROPOFOL (N/A )     Patient location during evaluation: PACU Anesthesia Type: General Level of consciousness: awake and alert Pain management: pain level controlled Vital Signs Assessment: post-procedure vital signs reviewed and stable Respiratory status: spontaneous breathing, nonlabored ventilation, respiratory function stable and patient connected to nasal cannula oxygen Cardiovascular status: blood pressure returned to baseline and stable Postop Assessment: no apparent nausea or vomiting Anesthetic complications: no   No complications documented.  Scarlette Slice

## 2019-08-11 ENCOUNTER — Encounter: Payer: Self-pay | Admitting: Gastroenterology

## 2020-08-01 ENCOUNTER — Encounter: Payer: No Typology Code available for payment source | Admitting: Internal Medicine

## 2020-08-26 ENCOUNTER — Other Ambulatory Visit: Payer: Self-pay

## 2020-08-26 ENCOUNTER — Ambulatory Visit
Admission: EM | Admit: 2020-08-26 | Discharge: 2020-08-26 | Disposition: A | Discharge status: Home / Self Care | Payer: No Typology Code available for payment source | Attending: Sports Medicine | Admitting: Sports Medicine

## 2020-08-26 DIAGNOSIS — M791 Myalgia, unspecified site: Secondary | ICD-10-CM | POA: Diagnosis not present

## 2020-08-26 DIAGNOSIS — A938 Other specified arthropod-borne viral fevers: Secondary | ICD-10-CM | POA: Diagnosis not present

## 2020-08-26 DIAGNOSIS — L299 Pruritus, unspecified: Secondary | ICD-10-CM

## 2020-08-26 DIAGNOSIS — T732XXA Exhaustion due to exposure, initial encounter: Secondary | ICD-10-CM

## 2020-08-26 DIAGNOSIS — R21 Rash and other nonspecific skin eruption: Secondary | ICD-10-CM

## 2020-08-26 MED ORDER — DOXYCYCLINE HYCLATE 100 MG PO CAPS
100.0000 mg | ORAL_CAPSULE | Freq: Two times a day (BID) | ORAL | 0 refills | Status: DC
Start: 2020-08-26 — End: 2020-09-19

## 2020-08-26 NOTE — ED Triage Notes (Signed)
Patient states that she removed a tick from the back of her right leg around 1 week ago. States that 3-4 days ago she developed fatigue and body aches, states that her partner noticed a ring around the bite today.

## 2020-08-26 NOTE — Discharge Instructions (Addendum)
As we discussed, I am going to go ahead and treat you for a tickborne illness. Please see educational handouts. Please use probiotics and/or yogurt's while taking the antibiotic. Tylenol or Motrin for any fever or discomfort. You can use over-the-counter 1% hydrocortisone cream or Benadryl cream if you have any itchiness.  You can use oral Benadryl at night if you have itchiness.  Avoid during the day due to drowsiness reasons. Monitor your symptoms, and if they progress or do not improve please see your primary care provider. Certainly if symptoms worsen then please go to the emergency room.

## 2020-08-26 NOTE — ED Provider Notes (Signed)
[facility]    CSN: [id number] Arrival date & time: 08/26/20  1319      History   Chief Complaint Chief Complaint  Patient presents with   Tick Bite    HPI Vicki Phillips is a 55 y.o. female.   Patient is a pleasant 55 year old female who presents for evaluation of a tick bite to the lower part of her right leg.  She noticed it about a week ago.  It was engorged at the time.  She is unsure how long it had been attached but she sure it is longer than 48 hours and may well of been several days.  Since then she has had low-grade fever, myalgias, and fatigue.  She noted a rash around that area about 3 to 4 days ago and some mild bruising.  No significant joint pain.  No chest pain or shortness of breath.  She is taken several at home COVID test because of the low-grade fever and fatigue which have all been negative.  His generally healthy, has seasonal allergies and takes a few medicines for this but otherwise does not report any significant medical issues and is not taking any other medications.  No red flag signs or symptoms elicited on history.   Past Medical History:  Diagnosis Date   Allergy    Sarcoidosis 2011    Patient Active Problem List   Diagnosis Date Noted   Special screening for malignant neoplasms, colon    Allergic rhinitis 06/30/2019   Insomnia 06/30/2019   Sarcoid 06/30/2019    Past Surgical History:  Procedure Laterality Date   CESAREAN SECTION     x 2   COLONOSCOPY WITH PROPOFOL N/A 08/10/2019   Procedure: COLONOSCOPY WITH PROPOFOL;  Surgeon: Midge Minium, MD;  Location: [facility];  Service: Endoscopy;  Laterality: N/A;  priority 4   LUNG BIOPSY      OB History   No obstetric history on file.      Home Medications    Prior to Admission medications   Medication Sig Start Date End Date Taking? Authorizing Provider  doxycycline (VIBRAMYCIN) 100 MG capsule Take 1 capsule (100 mg total) by mouth 2 (two) times daily. 08/26/20   Yes Delton See, MD  loratadine (CLARITIN) 10 MG tablet Take by mouth.   Yes [provider]  pseudoephedrine-acetaminophen (TYLENOL SINUS) 30-500 MG TABS tablet Take 1 tablet by mouth in the morning and at bedtime.   Yes [provider]  fluticasone (FLONASE) 50 MCG/ACT nasal spray Place 2 sprays into both nostrils daily. 03/04/18 02/27/19  [name], PA-C    Family History Family History  Problem Relation Age of Onset   Stroke Mother    Heart disease Mother    Heart attack Father 32   Heart disease Father    Kidney disease Sister     Social History Social History   Tobacco Use   Smoking status: Never   Smokeless tobacco: Never  Vaping Use   Vaping Use: Never used  Substance Use Topics   Alcohol use: Yes    Alcohol/week: 10.0 standard drinks    Types: 10 Standard drinks or equivalent per week   Drug use: No     Allergies   Patient has no known allergies.   Review of Systems Review of Systems  Constitutional:  Positive for fatigue and fever. Negative for activity change, appetite change, chills and diaphoresis.  HENT:  Negative for congestion, ear pain, postnasal drip, rhinorrhea, sinus pressure, sinus pain,  sneezing and sore throat.   Eyes:  Negative for pain.  Respiratory:  Negative for cough, chest tightness and shortness of breath.   Cardiovascular:  Negative for chest pain and palpitations.  Gastrointestinal:  Negative for abdominal pain, diarrhea, nausea and vomiting.  Genitourinary:  Negative for dysuria.  Musculoskeletal:  Positive for myalgias. Negative for arthralgias, back pain and neck pain.  Skin:  Positive for rash. Negative for color change, pallor and wound.  Neurological:  Negative for dizziness, light-headedness, numbness and headaches.  All other systems reviewed and are negative.   Physical Exam Triage Vital Signs ED Triage Vitals  Enc Vitals Group     BP 08/26/20 1415 (!) 153/99     Pulse Rate 08/26/20 1415 66      Resp 08/26/20 1415 16     Temp 08/26/20 1415 99.1 F (37.3 C)     Temp Source 08/26/20 1415 Oral     SpO2 08/26/20 1415 99 %     Weight 08/26/20 1412 229 lb 4.5 oz (104 kg)     Height 08/26/20 1412 5\' 9"  (1.753 m)     Head Circumference --      Peak Flow --      Pain Score 08/26/20 1412 3     Pain Loc --      Pain Edu? --      Excl. in GC? --    No data found.  Updated Vital Signs BP (!) 153/99 (BP Location: Right Arm)   Pulse 66   Temp 99.1 F (37.3 C) (Oral)   Resp 16   Ht 5\' 9"  (1.753 m)   Wt 104 kg   LMP 08/21/2014 (Approximate)   SpO2 99%   BMI 33.86 kg/m   Visual Acuity Right Eye Distance:   Left Eye Distance:   Bilateral Distance:    Right Eye Near:   Left Eye Near:    Bilateral Near:     Physical Exam Vitals and nursing note reviewed.  Constitutional:      General: She is not in acute distress.    Appearance: Normal appearance. She is not ill-appearing, toxic-appearing or diaphoretic.  HENT:     Head: Normocephalic and atraumatic.     Nose: Nose normal.     Mouth/Throat:     Mouth: Mucous membranes are moist.  Eyes:     Conjunctiva/sclera: Conjunctivae normal.     Pupils: Pupils are equal, round, and reactive to light.  Cardiovascular:     Rate and Rhythm: Normal rate and regular rhythm.     Pulses: Normal pulses.     Heart sounds: Normal heart sounds. No murmur heard.   No friction rub. No gallop.  Pulmonary:     Effort: Pulmonary effort is normal.     Breath sounds: Normal breath sounds. No stridor. No wheezing, rhonchi or rales.  Musculoskeletal:     Cervical back: Normal range of motion and neck supple.  Skin:    General: Skin is warm and dry.     Capillary Refill: Capillary refill takes less than 2 seconds.     Findings: Ecchymosis, erythema and rash present. No abrasion or abscess.     Comments: Small area in the right lower extremity about 6 cm proximal to the medial malleolus where there is a well-healing wound from what appears to be a  insect or tick bite.  There is some surrounding ecchymosis.  No actual bull's-eye rash.  There are some excoriations from pruritus.  No draining of the  wound.  No actual abscess.  Homans and Matthews test are negative.  Neurological:     General: No focal deficit present.     Mental Status: She is alert and oriented to person, place, and time.     UC Treatments / Results  Labs (all labs ordered are listed, but only abnormal results are displayed) Labs Reviewed - No data to display  EKG   Radiology No results found.  Procedures Procedures (including critical care time)  Medications Ordered in UC Medications - No data to display  Initial Impression / Assessment and Plan / UC Course  I have reviewed the triage vital signs and the nursing notes.  Pertinent labs & imaging results that were available during my care of the patient were reviewed by me and considered in my medical decision making (see chart for details).  Clinical impression: 1.  Tick bite with engorgement and no symptoms. 2.  Fatigue 3.  Myalgia 4.  Rash around the tick bite illness area 5.  Pruritus  Treatment plan: 1.  Findings and treatment plan were discussed in detail with the patient.  Patient was in agreement. 2.  Given her symptoms and her history we will go ahead and treat her with doxycycline.  In addition, we had a long discussion about doing labs including tickborne illness titers.  I advised her that it probably would not change our management and that there may be a significant cost involved.  She deferred on that. 3.  Educational handouts provided. 4.  Advised her to use probiotics or yogurt while taking the antibiotic. 5.  Tylenol or Motrin for any fever or discomfort. 6.  For the pruritus I advised over-the-counter 1% hydrocortisone cream or Benadryl cream.  She can use oral Benadryl if she has itchiness but just at nighttime because of the drowsiness. 7.  If symptoms persist she is to see her  primary care provider. 8.  If symptoms worsen she should go to the ER. 9.  She was discharged in stable condition and will follow-up here as needed.    Final Clinical Impressions(s) / UC Diagnoses   Final diagnoses:  Fatigue due to exposure, initial encounter  Myalgia  Tick borne fever  Rash  Pruritus     Discharge Instructions      As we discussed, I am going to go ahead and treat you for a tickborne illness. Please see educational handouts. Please use probiotics and/or yogurt's while taking the antibiotic. Tylenol or Motrin for any fever or discomfort. You can use over-the-counter 1% hydrocortisone cream or Benadryl cream if you have any itchiness.  You can use oral Benadryl at night if you have itchiness.  Avoid during the day due to drowsiness reasons. Monitor your symptoms, and if they progress or do not improve please see your primary care provider. Certainly if symptoms worsen then please go to the emergency room.     ED Prescriptions     Medication Sig Dispense Auth. Provider   doxycycline (VIBRAMYCIN) 100 MG capsule Take 1 capsule (100 mg total) by mouth 2 (two) times daily. 20 capsule Delton See, MD      PDMP not reviewed this encounter.   Delton See, MD 08/26/20 [phone]

## 2020-09-16 ENCOUNTER — Ambulatory Visit: Payer: Self-pay | Admitting: *Deleted

## 2020-09-16 NOTE — Telephone Encounter (Signed)
Summary: Lyme Disease   Patient has Lyme disease, pt says she feels horrible. Was told to follow up with her PCP, no appts until the end of august. Held for triage 4 mins.   [phone]      Reason for Disposition . [1] COVID-19 infection suspected by caller or triager AND [2] mild symptoms (cough, fever, or others) AND [3] negative COVID-19 rapid test . [1] 2 to 14 days following tick bite AND [2] fever AND [3] no rash or headache  Answer Assessment - Initial Assessment Questions 1. TYPE of TICK: "Is it a wood tick or a deer tick?" (e.g., deer tick, wood tick; unsure)     Unsure- embedded in leg 2. SIZE of TICK: "How big is the tick?" (e.g., size of poppy seed, apple seed, watermelon seed; unsure) Note: Deer ticks can be the size of a poppy seed (nymph) or an apple seed (adult).       Not sure- broke off 3. ENGORGED: "Did the tick look flat or engorged (full, swollen)?" (e.g., flat, engorged; unsure)     engorged 4. LOCATION: "Where is the tick bite located?"      R lower leg 5. ONSET: "How long do you think the tick was attached before you removed it?" (e.g., 5 hours, 2 days)      7/4- unknown 6. APPEARANCE of BITE or RASH: "What does the site look like?"     Red, rash- was treated for presumptive lyme disease.   7. PREGNANCY: "Is there any chance you are pregnant?" "When was your last menstrual period?"     N/a  Answer Assessment - Initial Assessment Questions 1. COVID-19 DIAGNOSIS: "Who made your COVID-19 diagnosis?" "Was it confirmed by a positive lab test or self-test?" If not diagnosed by a doctor (or NP/PA), ask "Are there lots of cases (community spread) where you live?" Note: See public health department website, if unsure.     Negative home test 2. COVID-19 EXPOSURE: "Was there any known exposure to COVID before the symptoms began?" CDC Definition of close contact: within 6 feet (2 meters) for a total of 15 minutes or more over a 24-hour period.      Work exposure  possible 3. ONSET: "When did the COVID-19 symptoms start?"      Continuation- fever started last week 4. WORST SYMPTOM: "What is your worst symptom?" (e.g., cough, fever, shortness of breath, muscle aches)     General fatigue, nausea 5. COUGH: "Do you have a cough?" If Yes, ask: "How bad is the cough?"       Yes- clear sputum 6. FEVER: "Do you have a fever?" If Yes, ask: "What is your temperature, how was it measured, and when did it start?"     99.1 7. RESPIRATORY STATUS: "Describe your breathing?" (e.g., shortness of breath, wheezing, unable to speak)      Struggling- to breath- feels like patient not getting enough air in -O2 sat 99% 8. BETTER-SAME-WORSE: "Are you getting better, staying the same or getting worse compared to yesterday?"  If getting worse, ask, "In what way?"     Worse- nausea, fatigue and headache 9. HIGH RISK DISEASE: "Do you have any chronic medical problems?" (e.g., asthma, heart or lung disease, weak immune system, obesity, etc.)     Sarcoidosis in past 10. VACCINE: "Have you had the COVID-19 vaccine?" If Yes, ask: "Which one, how many shots, when did you get it?"       yes 11. BOOSTER: "Have you received your  COVID-19 booster?" If Yes, ask: "Which one and when did you get it?"       yes 12. PREGNANCY: "Is there any chance you are pregnant?" "When was your last menstrual period?"       N/a 13. OTHER SYMPTOMS: "Do you have any other symptoms?"  (e.g., chills, fatigue, headache, loss of smell or taste, muscle pain, sore throat)       Feet /hands tingling, painful allergies 14. O2 SATURATION MONITOR:  "Do you use an oxygen saturation monitor (pulse oximeter) at home?" If Yes, ask "What is your reading (oxygen level) today?" "What is your usual oxygen saturation reading?" (e.g., 95%)       99%  Protocols used: Tick Bite-A-AH, Coronavirus (COVID-19) Diagnosed or Suspected-A-AH

## 2020-09-16 NOTE — Telephone Encounter (Signed)
Patient is calling to report she was treated for presumptive Lyme disease form tick bite 7/4- she states her symptoms did improve- but she has had overlapping new symptoms that could possible be COVID. Patient has taken 3 negative COVID home test- Wed/ Fri/Sun. Patient PCP is out of office- call to office- advised UC for evaluation. Patient advised- not sure that she will go.

## 2020-09-19 ENCOUNTER — Ambulatory Visit (INDEPENDENT_AMBULATORY_CARE_PROVIDER_SITE_OTHER): Payer: PRIVATE HEALTH INSURANCE

## 2020-09-19 ENCOUNTER — Other Ambulatory Visit: Payer: Self-pay

## 2020-09-19 ENCOUNTER — Ambulatory Visit
Admission: RE | Admit: 2020-09-19 | Discharge: 2020-09-19 | Disposition: A | Discharge status: Home / Self Care | Payer: PRIVATE HEALTH INSURANCE | Source: Ambulatory Visit | Attending: Internal Medicine | Admitting: Internal Medicine

## 2020-09-19 VITALS — BP 141/94 | HR 65 | Temp 98.4°F | Resp 18

## 2020-09-19 DIAGNOSIS — J069 Acute upper respiratory infection, unspecified: Secondary | ICD-10-CM | POA: Insufficient documentation

## 2020-09-19 DIAGNOSIS — R509 Fever, unspecified: Secondary | ICD-10-CM | POA: Diagnosis not present

## 2020-09-19 DIAGNOSIS — Z20822 Contact with and (suspected) exposure to covid-19: Secondary | ICD-10-CM | POA: Diagnosis not present

## 2020-09-19 DIAGNOSIS — R059 Cough, unspecified: Secondary | ICD-10-CM

## 2020-09-19 LAB — CBC WITH DIFFERENTIAL/PLATELET
Abs Immature Granulocytes: 0.02 K/uL (ref 0.00–0.07)
Basophils Absolute: 0 K/uL (ref 0.0–0.1)
Basophils Relative: 1 %
Eosinophils Absolute: 0.1 K/uL (ref 0.0–0.5)
Eosinophils Relative: 2 %
HCT: 41.1 % (ref 36.0–46.0)
Hemoglobin: 14.3 g/dL (ref 12.0–15.0)
Immature Granulocytes: 0 %
Lymphocytes Relative: 13 %
Lymphs Abs: 0.7 K/uL (ref 0.7–4.0)
MCH: 32.9 pg (ref 26.0–34.0)
MCHC: 34.8 g/dL (ref 30.0–36.0)
MCV: 94.7 fL (ref 80.0–100.0)
Monocytes Absolute: 0.3 K/uL (ref 0.1–1.0)
Monocytes Relative: 6 %
Neutro Abs: 4.1 K/uL (ref 1.7–7.7)
Neutrophils Relative %: 78 %
Platelets: 236 K/uL (ref 150–400)
RBC: 4.34 MIL/uL (ref 3.87–5.11)
RDW: 13.1 % (ref 11.5–15.5)
WBC: 5.2 K/uL (ref 4.0–10.5)
nRBC: 0 % (ref 0.0–0.2)

## 2020-09-19 LAB — COMPREHENSIVE METABOLIC PANEL
ALT: 19 U/L (ref 0–44)
AST: 27 U/L (ref 15–41)
Albumin: 4.5 g/dL (ref 3.5–5.0)
Alkaline Phosphatase: 72 U/L (ref 38–126)
Anion gap: 8 (ref 5–15)
BUN: 16 mg/dL (ref 6–20)
CO2: 24 mmol/L (ref 22–32)
Calcium: 9.2 mg/dL (ref 8.9–10.3)
Chloride: 105 mmol/L (ref 98–111)
Creatinine, Ser: 0.63 mg/dL (ref 0.44–1.00)
GFR, Estimated: >60 mL/min (ref >60)
Glucose, Bld: 112 mg/dL — ABNORMAL HIGH (ref 70–99)
Potassium: 4.2 mmol/L (ref 3.5–5.1)
Sodium: 137 mmol/L (ref 135–145)
Total Bilirubin: 0.3 mg/dL (ref 0.3–1.2)
Total Protein: 8.1 g/dL (ref 6.5–8.1)

## 2020-09-19 LAB — SARS CORONAVIRUS 2 (TAT 6-24 HRS): SARS Coronavirus 2: NEGATIVE

## 2020-09-19 NOTE — ED Provider Notes (Signed)
[facility]    CSN: [id number] Arrival date & time: 09/19/20  0849      History   Chief Complaint Chief Complaint  Patient presents with   Appointment    9:00am   Fatigue    HPI Vicki Phillips is a 55 y.o. female who presents with worsening of respiratory symptoms x 1 week. She started feeling bad and was treated for possible lyme 2 weeks ago and when she got done with the Doxy she was feeling better for a few days before her symptoms came back. Has been having low grade temp x 1 week. Her partner has had the same and had neg home and PCR covid tests. She has been feeling SOB when laying and sitting up, but not when walking. Has hx of lung sarcoidosis. Diarrhea has been getting worse x 2 days, had 4 BM's yesterday and # 5 since this am. Has had 3 home covid tests.    Past Medical History:  Diagnosis Date   Allergy    Sarcoidosis 2011    Patient Active Problem List   Diagnosis Date Noted   Special screening for malignant neoplasms, colon    Allergic rhinitis 06/30/2019   Insomnia 06/30/2019   Sarcoid 06/30/2019    Past Surgical History:  Procedure Laterality Date   CESAREAN SECTION     x 2   COLONOSCOPY WITH PROPOFOL N/A 08/10/2019   Procedure: COLONOSCOPY WITH PROPOFOL;  Surgeon: Midge Minium, MD;  Location: [facility];  Service: Endoscopy;  Laterality: N/A;  priority 4   LUNG BIOPSY      OB History   No obstetric history on file.      Home Medications    Prior to Admission medications   Medication Sig Start Date End Date Taking? Authorizing Provider  loratadine (CLARITIN) 10 MG tablet Take by mouth.   Yes [provider]  pseudoephedrine-acetaminophen (TYLENOL SINUS) 30-500 MG TABS tablet Take 1 tablet by mouth in the morning and at bedtime.   Yes [provider]  doxycycline (VIBRAMYCIN) 100 MG capsule Take 1 capsule (100 mg total) by mouth 2 (two) times daily. Patient not taking: Reported on 09/19/2020 08/26/20    Delton See, MD  fluticasone [facility]) 50 MCG/ACT nasal spray Place 2 sprays into both nostrils daily. 03/04/18 02/27/19  [name], PA-C    Family History Family History  Problem Relation Age of Onset   Stroke Mother    Heart disease Mother    Heart attack Father 4   Heart disease Father    Kidney disease Sister     Social History Social History   Tobacco Use   Smoking status: Never   Smokeless tobacco: Never  Vaping Use   Vaping Use: Never used  Substance Use Topics   Alcohol use: Yes    Alcohol/week: 10.0 standard drinks    Types: 10 Standard drinks or equivalent per week   Drug use: No     Allergies   Patient has no known allergies.   Review of Systems Review of Systems  Constitutional:  Positive for appetite change, diaphoresis, fatigue and fever.  HENT:  Positive for congestion. Negative for ear discharge, ear pain, sore throat and trouble swallowing.   Eyes:  Negative for discharge.  Respiratory:  Positive for cough.   Gastrointestinal:  Positive for diarrhea and nausea.  Neurological:  Positive for headaches.    Physical Exam Triage Vital Signs ED Triage Vitals  Enc Vitals Group     BP 09/19/20  0900 (!) 145/101     Pulse Rate 09/19/20 0900 65     Resp 09/19/20 0900 18     Temp 09/19/20 0900 98.4 F (36.9 C)     Temp Source 09/19/20 0900 Oral     SpO2 09/19/20 0900 97 %     Weight --      Height --      Head Circumference --      Peak Flow --      Pain Score 09/19/20 0856 5     Pain Loc --      Pain Edu? --      Excl. in GC? --    No data found.  Updated Vital Signs BP (!) 141/94 (BP Location: Left Arm) Comment (BP Location): repositioned, large cuff  Pulse 65   Temp 98.4 F (36.9 C) (Oral)   Resp 18   LMP 08/21/2014 (Approximate)   SpO2 97%   Visual Acuity Right Eye Distance:   Left Eye Distance:   Bilateral Distance:    Right Eye Near:   Left Eye Near:    Bilateral Near:       Physical Exam Vitals signs and  nursing note reviewed.  Constitutional:      General: She is not in acute distress.    Appearance: Normal appearance. She is not ill-appearing, toxic-appearing or diaphoretic.  HENT:     Head: Normocephalic.     Right Ear: Tympanic membrane, ear canal and external ear normal.     Left Ear: Tympanic membrane, ear canal and external ear normal.     Nose: Nose normal.     Mouth/Throat:     Mouth: Mucous membranes are moist.  Eyes:     General: No scleral icterus.       Right eye: No discharge.        Left eye: No discharge.     Conjunctiva/sclera: Conjunctivae normal.  Neck:     Musculoskeletal: Neck supple. No neck rigidity.  Cardiovascular:     Rate and Rhythm: Normal rate and regular rhythm.     Heart sounds: No murmur.  Pulmonary:     Effort: Pulmonary effort is normal. Speaks full sentences without SOB    Breath sounds: Normal breath sounds.  Abdominal:     General: Bowel sounds are normal. There is no distension.     Palpations: Abdomen is soft. There is no mass.     Tenderness: There is no abdominal tenderness. There is no guarding or rebound.     Hernia: No hernia is present.  Musculoskeletal: Normal range of motion.  Lymphadenopathy:     Cervical: No cervical adenopathy.  Skin:    General: Skin is warm and dry.     Coloration: Skin is not jaundiced.     Findings: No rash.  Neurological:     Mental Status: She is alert and oriented to person, place, and time.     Gait: Gait normal.  Psychiatric:        Mood and Affect: Mood normal.        Behavior: Behavior normal.        Thought Content: Thought content normal.        Judgment: Judgment normal.   UC Treatments / Results  Labs (all labs ordered are listed, but only abnormal results are displayed) Labs Reviewed  SARS CORONAVIRUS 2 (TAT 6-24 HRS)  CBC WITH DIFFERENTIAL/PLATELET  COMPREHENSIVE METABOLIC PANEL    EKG   Radiology DG Chest 2 View  Result Date: 09/19/2020 CLINICAL DATA:  Cough, low-grade fever  EXAM: CHEST - 2 VIEW COMPARISON:  11/21/2008 FINDINGS: Heart and mediastinal contours are within normal limits. No focal opacities or effusions. No acute bony abnormality. IMPRESSION: No active cardiopulmonary disease. Electronically Signed   By: Charlett Nose M.D.   On: 09/19/2020 09:38    Procedures Procedures (including critical care time)  Medications Ordered in UC Medications - No data to display  Initial Impression / Assessment and Plan / UC Course  I have reviewed the triage vital signs and the nursing notes. Pertinent labs & imaging results that were available during my care of the patient were reviewed by me and considered in my medical decision making (see chart for details). Upper respiratory illness and diarrhea- covid like illness.  Covid test is pending and she will check on her mychart for results.  She is to FU with PCP if symptoms persist and covid test is neg Advised her to take immune support supplements like Vit D, melatonoin, Vit C.     Final Clinical Impressions(s) / UC Diagnoses   Final diagnoses:  None   Discharge Instructions   None    ED Prescriptions   None    PDMP not reviewed this encounter.   Garey Ham, PA-C 09/19/20 1028

## 2020-09-19 NOTE — ED Triage Notes (Signed)
Complains of fatigue and fever.    Patient was seen 7/4 for tick bite, rash and fever.  Finished doxycycline.  Was feeling better, but is feeling worse again.  Reports pcp refused to see her.  Home covid tests are negative.    Reports cough, sob, headache, sinus congestion, pressure, and low grade fever intermittently (99.8)

## 2020-09-19 NOTE — Discharge Instructions (Addendum)
All your blood work is normal except your glucose is a little elevated to 112( normal is less than 99), but If you ate or drank anything sweet, this is normal. Your chest xray is normal with no signs of infection.  You may have some other viral respiratory infection if your Covid test comes back negative. Please call your family Dr today and make a follow up appointment.

## 2021-10-14 ENCOUNTER — Encounter: Payer: Self-pay | Admitting: Emergency Medicine

## 2021-10-14 ENCOUNTER — Telehealth: Payer: Self-pay | Admitting: Family

## 2021-10-14 ENCOUNTER — Ambulatory Visit (INDEPENDENT_AMBULATORY_CARE_PROVIDER_SITE_OTHER): Payer: BC Managed Care – PPO

## 2021-10-14 ENCOUNTER — Ambulatory Visit
Admission: EM | Admit: 2021-10-14 | Discharge: 2021-10-14 | Discharge status: Against Medical Advice | Payer: BC Managed Care – PPO | Attending: Family | Admitting: Family

## 2021-10-14 DIAGNOSIS — R030 Elevated blood-pressure reading, without diagnosis of hypertension: Secondary | ICD-10-CM

## 2021-10-14 DIAGNOSIS — R911 Solitary pulmonary nodule: Secondary | ICD-10-CM

## 2021-10-14 DIAGNOSIS — R0789 Other chest pain: Secondary | ICD-10-CM | POA: Diagnosis not present

## 2021-10-14 DIAGNOSIS — R0602 Shortness of breath: Secondary | ICD-10-CM

## 2021-10-14 DIAGNOSIS — R110 Nausea: Secondary | ICD-10-CM

## 2021-10-14 NOTE — ED Notes (Signed)
Pt left before visit was completed.

## 2021-10-14 NOTE — Discharge Instructions (Addendum)
Your ECG appears reassuring at this time- no significant change compared to ECG done in 2015. However, your chest x-ray shows new lung nodules that were not present in your previous chest x-ray. We need to do additional testing (imaging) to get more information about those nodules. The radiologist recommends a CT scan with contrast which your Primary Care Provider can set up for you. We recommend you contact them (if they don't contact you first) about scheduling this test. If the shortness of breath gets worse and the chest pain continues, we recommend you go to the ER ASAP. Otherwise, follow-up with your PCP for a CT scan of your chest as soon as possible.

## 2021-10-14 NOTE — ED Provider Notes (Signed)
[facility]    CSN: [id number] Arrival date & time: 10/14/21  1658      History   Chief Complaint Chief Complaint  Patient presents with   Shortness of Breath   Nausea   Numbness   Chest Pain    HPI Vicki Phillips is a 56 y.o. female.   56 year old female presents with shortness of breath that started about 1 week ago. She noticed that she was getting winded doing routine tasks. Then she also started having some nausea and central chest pain. She also started having left shoulder pain that would travel down her left arm with occasional numbness. She has experienced some dizziness a week ago and then again yesterday. Feels pressure in chest and occasional diaphoresis. Denies any fever, unusual nasal congestion. Has history of chronic sinus symptoms and takes Claritin and OTC sinus medication daily. She denies any abdominal pain, dysuria, vomiting but has had more frequent stools. She has taken Tylenol for pain with minimal relief. She does have a history of Sarcoid which has been stable.    Shortness of Breath Associated symptoms: chest pain   Chest Pain Associated symptoms: shortness of breath     Past Medical History:  Diagnosis Date   Allergy    Sarcoidosis 2011    Patient Active Problem List   Diagnosis Date Noted   Special screening for malignant neoplasms, colon    Allergic rhinitis 06/30/2019   Insomnia 06/30/2019   Sarcoid 06/30/2019    Past Surgical History:  Procedure Laterality Date   CESAREAN SECTION     x 2   COLONOSCOPY WITH PROPOFOL N/A 08/10/2019   Procedure: COLONOSCOPY WITH PROPOFOL;  Surgeon: Midge Minium, MD;  Location: [facility];  Service: Endoscopy;  Laterality: N/A;  priority 4   LUNG BIOPSY      OB History   No obstetric history on file.      Home Medications    Prior to Admission medications   Medication Sig Start Date End Date Taking? Authorizing Provider  loratadine (CLARITIN) 10 MG tablet Take by  mouth.    [provider]  pseudoephedrine-acetaminophen (TYLENOL SINUS) 30-500 MG TABS tablet Take 1 tablet by mouth in the morning and at bedtime.    [provider]  fluticasone (FLONASE) 50 MCG/ACT nasal spray Place 2 sprays into both nostrils daily. 03/04/18 02/27/19  [name], PA-C    Family History Family History  Problem Relation Age of Onset   Stroke Mother    Heart disease Mother    Heart attack Father 38   Heart disease Father    Kidney disease Sister     Social History Social History   Tobacco Use   Smoking status: Never   Smokeless tobacco: Never  Vaping Use   Vaping Use: Never used  Substance Use Topics   Alcohol use: Yes    Alcohol/week: 10.0 standard drinks of alcohol    Types: 10 Standard drinks or equivalent per week   Drug use: No     Allergies   Patient has no known allergies.   Review of Systems Review of Systems  Respiratory:  Positive for shortness of breath.   Cardiovascular:  Positive for chest pain.     Physical Exam Triage Vital Signs ED Triage Vitals  Enc Vitals Group     BP 10/14/21 1729 (!) 144/103     Pulse Rate 10/14/21 1729 81     Resp 10/14/21 1729 16     Temp  10/14/21 1729 98.6 F (37 C)     Temp Source 10/14/21 1729 Oral     SpO2 10/14/21 1729 99 %     Weight --      Height --      Head Circumference --      Peak Flow --      Pain Score 10/14/21 1728 4     Pain Loc --      Pain Edu? --      Excl. in GC? --    No data found.  Updated Vital Signs BP (!) 144/103 (BP Location: Left Arm)   Pulse 81   Temp 98.6 F (37 C) (Oral)   Resp 16   LMP 08/21/2014 (Approximate)   SpO2 99%   Visual Acuity Right Eye Distance:   Left Eye Distance:   Bilateral Distance:    Right Eye Near:   Left Eye Near:    Bilateral Near:     Physical Exam   UC Treatments / Results  Labs (all labs ordered are listed, but only abnormal results are displayed) Labs Reviewed - No data to  display  EKG   Radiology DG Chest 2 View  Result Date: 10/14/2021 CLINICAL DATA:  Shortness of breath. EXAM: CHEST - 2 VIEW COMPARISON:  Chest radiograph dated 09/19/2020. FINDINGS: No focal consolidation, pleural effusion, or pneumothorax. A 9 mm nodule in the left upper lung field appears new since the prior radiograph. Additional smaller nodules in the left mid to lower lung field measures 5 mm. Further evaluation with chest CT is recommended. The cardiac silhouette is within limits. No acute osseous pathology. IMPRESSION: 1. No acute cardiopulmonary process. 2. Left lung nodules. Further evaluation with chest CT is recommended. Electronically Signed   By: Elgie Collard M.D.   On: 10/14/2021 19:22    Procedures Procedures (including critical care time)  Medications Ordered in UC Medications - No data to display  Initial Impression / Assessment and Plan / UC Course  I have reviewed the triage vital signs and the nursing notes.  Pertinent labs & imaging results that were available during my care of the patient were reviewed by me and considered in my medical decision making (see chart for details).     *** Final Clinical Impressions(s) / UC Diagnoses   Final diagnoses:  Shortness of breath  Other chest pain  Nausea  Lung nodule seen on imaging study  Elevated blood-pressure reading without diagnosis of hypertension     Discharge Instructions      Your ECG appears reassuring at this time- no significant change compared to ECG done in 2015. However, your chest x-ray shows new lung nodules that were not present in your previous chest x-ray. We need to do additional testing (imaging) to get more information about those nodules. The radiologist recommends a CT scan with contrast which your Primary Care Provider can set up for you. We recommend you contact them (if they don't contact you first) about scheduling this test. If the shortness of breath gets worse and the chest pain  continues, we recommend you go to the ER ASAP. Otherwise, follow-up with your PCP for a CT scan of your chest as soon as possible.     ED Prescriptions   None    PDMP not reviewed this encounter.

## 2021-10-14 NOTE — Telephone Encounter (Signed)
Patient left Urgent Care before provider could review chest x-ray results with patient. Called patient to review results but no answer. Left message for patient to call back to review results tomorrow since Urgent care is now closed. She needs a CT scan which can be ordered by her PCP. If symptoms worsen, she may go to the ER ASAP.

## 2021-10-14 NOTE — ED Triage Notes (Signed)
Pt presents with center chest pain, SOB, left arm numbness and tingling with shoulder pain and nausea off and on x 1 week.

## 2021-10-30 ENCOUNTER — Telehealth (HOSPITAL_COMMUNITY): Payer: Self-pay | Admitting: Emergency Medicine

## 2021-10-30 NOTE — Telephone Encounter (Signed)
Attempted to reach patient to review results from recent urgent care visit.  Provider Dewayne Hatch, APP wanted to check in on patient and make sure she received everything.   No answer, voicemail left

## 2022-07-29 DIAGNOSIS — R079 Chest pain, unspecified: Secondary | ICD-10-CM | POA: Diagnosis not present

## 2022-07-29 DIAGNOSIS — R55 Syncope and collapse: Secondary | ICD-10-CM | POA: Diagnosis not present

## 2022-07-29 DIAGNOSIS — R0602 Shortness of breath: Secondary | ICD-10-CM | POA: Diagnosis not present

## 2022-07-29 DIAGNOSIS — Z5321 Procedure and treatment not carried out due to patient leaving prior to being seen by health care provider: Secondary | ICD-10-CM | POA: Diagnosis not present

## 2022-07-29 DIAGNOSIS — R918 Other nonspecific abnormal finding of lung field: Secondary | ICD-10-CM | POA: Diagnosis not present

## 2022-07-30 ENCOUNTER — Telehealth: Payer: Self-pay

## 2022-07-30 ENCOUNTER — Other Ambulatory Visit: Payer: Self-pay

## 2022-07-30 DIAGNOSIS — R079 Chest pain, unspecified: Secondary | ICD-10-CM | POA: Diagnosis not present

## 2022-07-30 DIAGNOSIS — R0789 Other chest pain: Secondary | ICD-10-CM | POA: Diagnosis not present

## 2022-07-30 DIAGNOSIS — R918 Other nonspecific abnormal finding of lung field: Secondary | ICD-10-CM

## 2022-07-30 DIAGNOSIS — R0602 Shortness of breath: Secondary | ICD-10-CM

## 2022-07-30 DIAGNOSIS — D869 Sarcoidosis, unspecified: Secondary | ICD-10-CM

## 2022-07-30 NOTE — Telephone Encounter (Signed)
Please review.  KP

## 2022-07-30 NOTE — Transitions of Care (Post Inpatient/ED Visit) (Signed)
   07/30/2022  Name: Vicki Phillips MRN: [id number] DOB: Sep 12, 1965  Today's TOC FU Call Status: Today's TOC FU Call Status:: Successful TOC FU Call Competed TOC FU Call Complete Date: 07/30/22  Transition Care Management Follow-up Telephone Call Date of Discharge: 07/29/22 Discharge Facility: Other ([facility]) Name of Other ([facility]) Discharge Facility: [facility] Type of Discharge: Emergency Department Reason for ED Visit: Cardiac Conditions Cardiac Conditions Diagnosis:  (chest pain) How have you been since you were released from the hospital?: Better Any questions or concerns?: No  Items Reviewed: Did you receive and understand the discharge instructions provided?: Yes Medications obtained,verified, and reconciled?: Yes (Medications Reviewed) Any new allergies since your discharge?: No Dietary orders reviewed?: Yes Do you have support at home?: Yes  Medications Reviewed Today: Medications Reviewed Today     Reviewed by Merleen Nicely, LPN (Licensed Practical Nurse) on 07/30/22 at 1145  Med List Status: <None>   Medication Order Taking? Sig Documenting Provider Last Dose Status Informant    Discontinued 02/27/19 1713   loratadine (CLARITIN) 10 MG tablet [id number] Yes Take by mouth. [provider] Taking Active   pseudoephedrine-acetaminophen (TYLENOL SINUS) 30-500 MG TABS tablet [id number] Yes Take 1 tablet by mouth in the morning and at bedtime. [provider] Taking Active             Home Care and Equipment/Supplies: Were Home Health Services Ordered?: No Any new equipment or medical supplies ordered?: No  Functional Questionnaire: Do you need assistance with bathing/showering or dressing?: No Do you need assistance with meal preparation?: No Do you need assistance with eating?: No Do you have difficulty maintaining continence: No Do you need assistance with getting out of bed/getting out of a chair/moving?: No Do you  have difficulty managing or taking your medications?: No  Follow up appointments reviewed: PCP Follow-up appointment confirmed?: No (refused) MD Provider Line Number:[phone] Given: Yes Specialist Hospital Follow-up appointment confirmed?: Yes Date of Specialist follow-up appointment?: 08/06/22 Follow-Up Specialty Provider:: Dr Aundria Rud Do you need transportation to your follow-up appointment?: No Do you understand care options if your condition(s) worsen?: Yes-patient verbalized understanding    SIGNATURE  Woodfin Ganja LPN [facility] Nurse Health Advisor Direct Dial [phone]

## 2022-07-30 NOTE — Transitions of Care (Post Inpatient/ED Visit) (Signed)
   07/30/2022  Name: Vicki Phillips MRN: [id number] DOB: [birth year]  Today's TOC FU Call Status: Today's TOC FU Call Status:: Successful TOC FU Call Competed TOC FU Call Complete Date: 07/30/22  Transition Care Management Follow-up Telephone Call Date of Discharge: 07/29/22 Discharge Facility: Other ([facility]) [facility]: [facility] Type of Discharge: Emergency Department How have you been since you were released from the hospital?: Same Any questions or concerns?: No  Items Reviewed: Did you receive and understand the discharge instructions provided?: No Medications obtained,verified, and reconciled?: Yes (Medications Reviewed) Any new allergies since your discharge?: No Dietary orders reviewed?: No Do you have support at home?: Yes  Medications Reviewed Today: Medications Reviewed Today     Reviewed by Lanna Poche, RN (Registered Nurse) on 10/14/21 at 1728  Med List Status: <None>   Medication Order Taking? Sig Documenting Provider Last Dose Status Informant  loratadine (CLARITIN) 10 MG tablet [id number]  Take by mouth. [provider]  Active   pseudoephedrine-acetaminophen (TYLENOL SINUS) 30-500 MG TABS tablet [id number]  Take 1 tablet by mouth in the morning and at bedtime. [provider]  Active             Home Care and Equipment/Supplies: Were Home Health Services Ordered?: No Any new equipment or medical supplies ordered?: No  Functional Questionnaire: Do you need assistance with bathing/showering or dressing?: No Do you need assistance with meal preparation?: No Do you need assistance with eating?: No Do you have difficulty maintaining continence: No Do you need assistance with getting out of bed/getting out of a chair/moving?: No Do you have difficulty managing or taking your medications?: No  Follow up appointments reviewed: PCP Follow-up appointment confirmed?: No MD Provider Line  Number:[phone] Given: Yes Specialist Hospital Follow-up appointment confirmed?: No (Referral placed Urgently for Pulmonary Clinic per Dr Judithann Graves) Do you need transportation to your follow-up appointment?: No Do you understand care options if your condition(s) worsen?: Yes-patient verbalized understanding    Marciano Mundt [facility]  Primary Care & Sports Medicine at [facility], [facility] [address]  [place] [address] Office [phone]  Fax: [phone]

## 2022-08-06 ENCOUNTER — Ambulatory Visit: Payer: BC Managed Care – PPO | Admitting: Student in an Organized Health Care Education/Training Program

## 2022-08-06 ENCOUNTER — Encounter: Payer: Self-pay | Admitting: Student in an Organized Health Care Education/Training Program

## 2022-08-06 ENCOUNTER — Other Ambulatory Visit
Admission: RE | Admit: 2022-08-06 | Discharge: 2022-08-06 | Disposition: A | Discharge status: Home / Self Care | Payer: BC Managed Care – PPO | Source: Ambulatory Visit | Attending: Student in an Organized Health Care Education/Training Program | Admitting: Student in an Organized Health Care Education/Training Program

## 2022-08-06 VITALS — BP 154/94 | HR 85 | Temp 96.8°F | Ht 69.0 in | Wt 229.0 lb

## 2022-08-06 DIAGNOSIS — D869 Sarcoidosis, unspecified: Secondary | ICD-10-CM | POA: Diagnosis not present

## 2022-08-06 DIAGNOSIS — E559 Vitamin D deficiency, unspecified: Secondary | ICD-10-CM | POA: Diagnosis not present

## 2022-08-06 DIAGNOSIS — Z862 Personal history of diseases of the blood and blood-forming organs and certain disorders involving the immune mechanism: Secondary | ICD-10-CM

## 2022-08-06 LAB — COMPREHENSIVE METABOLIC PANEL
ALT: 28 U/L (ref 0–44)
AST: 38 U/L (ref 15–41)
Albumin: 4.1 g/dL (ref 3.5–5.0)
Alkaline Phosphatase: 125 U/L (ref 38–126)
Anion gap: 11 (ref 5–15)
BUN: 13 mg/dL (ref 6–20)
CO2: 25 mmol/L (ref 22–32)
Calcium: 9.4 mg/dL (ref 8.9–10.3)
Chloride: 104 mmol/L (ref 98–111)
Creatinine, Ser: 0.72 mg/dL (ref 0.44–1.00)
GFR, Estimated: >60 mL/min (ref >60)
Glucose, Bld: 100 mg/dL — ABNORMAL HIGH (ref 70–99)
Potassium: 4.3 mmol/L (ref 3.5–5.1)
Sodium: 140 mmol/L (ref 135–145)
Total Bilirubin: 0.7 mg/dL (ref 0.3–1.2)
Total Protein: 7.6 g/dL (ref 6.5–8.1)

## 2022-08-06 LAB — TSH: TSH: 2.169 u[IU]/mL (ref 0.350–4.500)

## 2022-08-06 LAB — VITAMIN D 25 HYDROXY (VIT D DEFICIENCY, FRACTURES): Vit D, 25-Hydroxy: 14.89 ng/mL — ABNORMAL LOW (ref 30–100)

## 2022-08-06 NOTE — Progress Notes (Signed)
Synopsis: Referred in for sarcoidosis. by Reubin Milan, MD  Assessment & Plan:   1. History of sarcoidosis  Ms. Vicki Phillips has a history of sarcoidosis diagnosed in 2010 for which she did not receive treatment. She's not been seen by pulmonary nor by a sarcoidosis specialist. She's presenting with a month's history of chest tightness and cough, with a recent chest xray in the ED (access to report but not images) noting pulmonary nodules.  This is most likely a manifestation of the sarcoidosis given the history. I will obtain a high resolution chest CT to better evaluate the nodules, hilar/mediastinal lymph nodes, and lung parenchyma for any involvement. I will also obtain a pulmonary function test (spirometry, lung volumes, DLCO) to assess for any functional impairment. I will also obtain blood work to assess for sarcoidosis activity and end organ involvement. I also discussed with Vicki Phillips that options for sarcoidosis management would depend on pulmonary involvement, PFT's, and symptoms. She will present for follow up after the workup to discuss management. Finally I did ask her to reach out to her ophthalmologist to inform them of her sarcoidosis diagnosis.  - CT CHEST HIGH RESOLUTION; Future - Pulmonary Function Test [facility] Only; Future - Angiotensin converting enzyme; Future - VITAMIN D 25 Hydroxy (Vit-D Deficiency, Fractures); Future - Vitamin D 1,25 dihydroxy; Future - Urinalysis, Routine w reflex microscopic; Future - TSH; Future - Comp Met (CMET); Future   Return in about 3 weeks (around 08/27/2022).  I spent 60 minutes caring for this patient today, including preparing to see the patient, obtaining a medical history , reviewing a separately obtained history, performing a medically appropriate examination and/or evaluation, counseling and educating the patient/family/caregiver, ordering medications, tests, or procedures, and documenting clinical information in the  electronic health record  Raechel Chute, MD [facility] 08/06/2022 9:35 AM    End of visit medications:  No orders of the defined types were placed in this encounter.    Current Outpatient Medications:    loratadine (CLARITIN) 10 MG tablet, Take 10 mg by mouth daily., Disp: , Rfl:    pseudoephedrine-acetaminophen (TYLENOL SINUS) 30-500 MG TABS tablet, Take 1 tablet by mouth in the morning and at bedtime., Disp: , Rfl:    Subjective:   PATIENT ID: Vicki Phillips GENDER: female DOB: [birth year], MRN: [id number]  Chief Complaint  Patient presents with   Consult    Lung Nodule. Cough with clear sputum. SOB all the time. Wheezing.    HPI  Patient is a pleasant 57 year old female presenting to clinic for the evaluation of abnormal findings on chest xray in the setting of history of sarcoidosis.  Patient has been in her usual state of health until recently, when she experienced chest tightness prompting her to present to her local ED. The symptoms started around a month ago, and mostly consisted of shortness of breath, cough, and chest tightness. In the ED, she had blood work and was asked to wait for a long period of time. Troponins and EKG were obtained, and were normal. A chest xray obtained and was read as having multiple bilateral pulmonary nodules involving all 5 lung lobes. She left the hospital without being seen by and ED provider but was called by her primary care physician's office and a referral to pulmonary was placed.  Patient reports being in her usual state of health, and still has some of the symptoms that brought her to the ED. This includes chest tightness, cough, and an occasional  wheeze. Otherwise she's in her usual state of health. She hasn't experienced exertional dyspnea, fevers, chills, night sweats, or weight loss.  She has a history of sarcoidosis many years ago that was diagnosed following an excisional biopsy (pointed to a scar on the  chest). This was not done via bronchoscopy. A report for a PET scan in our system from 10/2008 mentions extensive hilar and mediastinal lymphadenopathy. A Chest CT from 10/2008 also showed adenopathy (hilar and mediastinal). The report doesn't mention any lung parenchymal disease. She underwent a left mediastinotomy in September of 2010 that showed the sarcoidosis, thou I do not have the result of the pathology.  Patient lives in a house with her children, and has lived there for a long time. 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 She has no occupational exposures. She's originally from [place] and moved to the [place] around 30 years ago.  Ancillary information including prior medications, full medical/surgical/family/social histories, and PFTs (when available) are listed below and have been reviewed.   Review of Systems  Constitutional:  Negative for chills, diaphoresis, fever, malaise/fatigue and weight loss.  Respiratory:  Positive for cough, shortness of breath and wheezing. Negative for hemoptysis and sputum production.   Cardiovascular:  Positive for chest pain. Negative for palpitations, orthopnea, claudication, leg swelling and PND.  Musculoskeletal:  Negative for myalgias.  Skin:  Negative for rash.     Objective:   Vitals:   08/06/22 0855  BP: (!) 154/94  Pulse: 85  Temp: (!) 96.8 F (36 C)  SpO2: 96%  Weight: 229 lb (103.9 kg)  Height: 5\' 9"  (1.753 m)   96% on RA  BMI Readings from Last 3 Encounters:  08/06/22 33.82 kg/m  08/26/20 33.86 kg/m  08/10/19 30.42 kg/m   Wt Readings from Last 3 Encounters:  08/06/22 229 lb (103.9 kg)  08/26/20 229 lb 4.5 oz (104 kg)  08/10/19 206 lb (93.4 kg)    Physical Exam Constitutional:      General: She is not in acute distress.    Appearance: Normal appearance. She is not ill-appearing.  HENT:     Head: Normocephalic.     Mouth/Throat:     Mouth: Mucous membranes are moist.  Eyes:     Extraocular Movements:  Extraocular movements intact.     Pupils: Pupils are equal, round, and reactive to light.  Cardiovascular:     Rate and Rhythm: Normal rate and regular rhythm.     Pulses: Normal pulses.     Heart sounds: Normal heart sounds.  Pulmonary:     Effort: Pulmonary effort is normal.     Breath sounds: Normal breath sounds. No wheezing or rales.  Neurological:     General: No focal deficit present.     Mental Status: She is alert and oriented to person, place, and time. Mental status is at baseline.       Ancillary Information    Past Medical History:  Diagnosis Date   Allergy    Sarcoidosis 2011     Family History  Problem Relation Age of Onset   Stroke Mother    Heart disease Mother    Heart attack Father 64   Heart disease Father    Kidney disease Sister      Past Surgical History:  Procedure Laterality Date   CESAREAN SECTION     x 2   COLONOSCOPY WITH PROPOFOL N/A 08/10/2019   Procedure: COLONOSCOPY WITH PROPOFOL;  Surgeon: Midge Minium, MD;  Location: [facility];  Service: Endoscopy;  Laterality: N/A;  priority 4   LUNG BIOPSY      Social History   Socioeconomic History   Marital status: Single    Spouse name: Not on file   Number of children: 2   Years of education: Not on file   Highest education level: Not on file  Occupational History   Not on file  Tobacco Use   Smoking status: Never   Smokeless tobacco: Never  Vaping Use   Vaping Use: Never used  Substance and Sexual Activity   Alcohol use: Yes    Alcohol/week: 10.0 standard drinks of alcohol    Types: 10 Standard drinks or equivalent per week   Drug use: No   Sexual activity: Yes    Partners: Male    Birth control/protection: Surgical  Other Topics Concern   Not on file  Social History Narrative   Not on file   Social Determinants of Health   Financial Resource Strain: Not on file  Food Insecurity: Not on file  Transportation Needs: Not on file  Physical Activity: Not on file   Stress: Not on file  Social Connections: Not on file  Intimate Partner Violence: Not on file     No Known Allergies   CBC    Component Value Date/Time   WBC 5.2 09/19/2020 0938   RBC 4.34 09/19/2020 0938   HGB 14.3 09/19/2020 0938   HGB 13.8 07/31/2019 1021   HCT 41.1 09/19/2020 0938   HCT 42.3 07/31/2019 1021   PLT 236 09/19/2020 0938   PLT 241 07/31/2019 1021   MCV 94.7 09/19/2020 0938   MCV 96 07/31/2019 1021   MCV 97 05/27/2013 0034   MCH 32.9 09/19/2020 0938   MCHC 34.8 09/19/2020 0938   RDW 13.1 09/19/2020 0938   RDW 13.0 07/31/2019 1021   RDW 13.3 05/27/2013 0034   LYMPHSABS 0.7 09/19/2020 0938   LYMPHSABS 0.7 07/31/2019 1021   MONOABS 0.3 09/19/2020 0938   EOSABS 0.1 09/19/2020 0938   EOSABS 0.2 07/31/2019 1021   BASOSABS 0.0 09/19/2020 0938   BASOSABS 0.0 07/31/2019 1021    Pulmonary Functions Testing Results:     No data to display          Outpatient Medications Prior to Visit  Medication Sig Dispense Refill   loratadine (CLARITIN) 10 MG tablet Take 10 mg by mouth daily.     pseudoephedrine-acetaminophen (TYLENOL SINUS) 30-500 MG TABS tablet Take 1 tablet by mouth in the morning and at bedtime.     No facility-administered medications prior to visit.

## 2022-08-07 LAB — ANGIOTENSIN CONVERTING ENZYME: Angiotensin-Converting Enzyme: 40 U/L (ref 14–82)

## 2022-08-17 LAB — VITAMIN D 1,25 DIHYDROXY
Vitamin D 1, 25 (OH)2 Total: 41 pg/mL
Vitamin D2 1, 25 (OH)2: <10 pg/mL
Vitamin D3 1, 25 (OH)2: 41 pg/mL

## 2022-08-18 ENCOUNTER — Ambulatory Visit
Admission: RE | Admit: 2022-08-18 | Discharge: 2022-08-18 | Disposition: A | Discharge status: Home / Self Care | Payer: BC Managed Care – PPO | Source: Ambulatory Visit | Attending: Student in an Organized Health Care Education/Training Program | Admitting: Student in an Organized Health Care Education/Training Program

## 2022-08-18 ENCOUNTER — Encounter: Payer: Self-pay | Admitting: *Deleted

## 2022-08-18 ENCOUNTER — Telehealth: Payer: Self-pay | Admitting: Student in an Organized Health Care Education/Training Program

## 2022-08-18 ENCOUNTER — Other Ambulatory Visit: Payer: Self-pay | Admitting: Student in an Organized Health Care Education/Training Program

## 2022-08-18 DIAGNOSIS — D869 Sarcoidosis, unspecified: Secondary | ICD-10-CM | POA: Insufficient documentation

## 2022-08-18 DIAGNOSIS — Z862 Personal history of diseases of the blood and blood-forming organs and certain disorders involving the immune mechanism: Secondary | ICD-10-CM

## 2022-08-18 DIAGNOSIS — C799 Secondary malignant neoplasm of unspecified site: Secondary | ICD-10-CM

## 2022-08-18 DIAGNOSIS — R918 Other nonspecific abnormal finding of lung field: Secondary | ICD-10-CM | POA: Insufficient documentation

## 2022-08-18 DIAGNOSIS — K769 Liver disease, unspecified: Secondary | ICD-10-CM

## 2022-08-18 NOTE — Telephone Encounter (Signed)
No need for urgent appointment with me. She just needs the PET, CT, biopsy, and oncology appointments urgently. I can help coordinate these from the back end.  Thanks [name]

## 2022-08-18 NOTE — Telephone Encounter (Signed)
Discussed with the patient over the phone. See separate encounter with orders and referral to oncology.

## 2022-08-18 NOTE — Telephone Encounter (Signed)
Unable to route separate encounter to provider.   PET and CT Abdomen scheduled 08/25/2022. Pending appt 7/17 with Dr. Aundria Rud.   Dr. Aundria Rud, please advise if patient should be seen sooner? No sooner availability for slot.

## 2022-08-18 NOTE — Telephone Encounter (Addendum)
noted 

## 2022-08-18 NOTE — Telephone Encounter (Signed)
Spoke to patient and relayed below message/recommendation.  She will keep scheduled appt for 09/09/2022. Nothing further needed.

## 2022-08-18 NOTE — Progress Notes (Signed)
Referral received to follow up with patient regarding abnormal CT scan. New patient appt scheduled with Dr. Alena Bills on Mon 7/1 at 11am. [facility] pt and confirmed appt. Contact info given and instructed to call with any questions or needs. Will follow up at new patient appt. Nothing further needed at this time.

## 2022-08-18 NOTE — Progress Notes (Unsigned)
PET and CT Abdomen scheduled 08/25/2022. Pending appt 7/17 with Dr. Aundria Rud.   Dr. Aundria Rud, please advise if patient should be seen sooner? No sooner availability for slot.

## 2022-08-18 NOTE — Progress Notes (Unsigned)
PET and CT Abdomen scheduled 08/25/2022. Pending appt 7/17 with Dr. Dgayli.   Dr. Dgayli, please advise if patient should be seen sooner? No sooner availability for 30min slot.  

## 2022-08-18 NOTE — Telephone Encounter (Signed)
Call report. Pls call [facility]

## 2022-08-18 NOTE — Telephone Encounter (Signed)
Received call report from [place] with [facility] on CT. Report is available via epic.    Dr. Aundria Rud, please advise. Thanks

## 2022-08-18 NOTE — Progress Notes (Signed)
Will contact patient to schedule appt once PET has been scheduled.

## 2022-08-18 NOTE — Progress Notes (Unsigned)
Brief Progress Note:  Discussed the result of Ms. Vicki Phillips's chest CT with her over the phone. Multiple pulmonary nodules are concerning for metastatic malignancy (miliary pattern). CT also shows a large liver mass. I have ordered a biopsy of the liver lesion as well as a PET/CT and an abdominal CT with contrast. Referral to oncology placed. Patient aware of the results and will be awaiting our calls.  Raechel Chute, MD [facility] 08/18/2022 12:13 PM

## 2022-08-20 ENCOUNTER — Ambulatory Visit: Payer: BC Managed Care – PPO

## 2022-08-21 NOTE — Progress Notes (Signed)
Vicki Mor, DO sent to [facility] for US guided liver mass biopsy.  Clearly new mass from the prior PET of 2010, seen on chest CT 08/18/2022.  [facility]       Previous Messages    ----- Message ----- From: Vicki [facility] Sent: 08/19/2022   9:44 AM EDT To: [facility] Procedure Requests Subject: US Biopsy                                      IR Approval Request:   Procedure:     US guided Liver mass biopsy  Reason:        ill-defined hypodense lesion of the anterior left lobe of the liver    History:         CT Chest High Resolution   08/18/2022  Provider:       Dr Raechel Chute, MD  Provider Contact #     [facility] [place]   [phone]    -----PT is scheduled for a PET scan and CT Abd Pelvis w/contrast on 08/25/2022

## 2022-08-24 ENCOUNTER — Inpatient Hospital Stay: Payer: BC Managed Care – PPO | Attending: Internal Medicine | Admitting: Internal Medicine

## 2022-08-24 ENCOUNTER — Encounter: Payer: Self-pay | Admitting: *Deleted

## 2022-08-24 ENCOUNTER — Inpatient Hospital Stay: Payer: BC Managed Care – PPO

## 2022-08-24 VITALS — BP 150/97 | HR 97 | Temp 98.6°F | Wt 246.0 lb

## 2022-08-24 DIAGNOSIS — Z5111 Encounter for antineoplastic chemotherapy: Secondary | ICD-10-CM | POA: Diagnosis not present

## 2022-08-24 DIAGNOSIS — R918 Other nonspecific abnormal finding of lung field: Secondary | ICD-10-CM | POA: Insufficient documentation

## 2022-08-24 DIAGNOSIS — R197 Diarrhea, unspecified: Secondary | ICD-10-CM | POA: Diagnosis not present

## 2022-08-24 DIAGNOSIS — Z79899 Other long term (current) drug therapy: Secondary | ICD-10-CM | POA: Insufficient documentation

## 2022-08-24 DIAGNOSIS — R978 Other abnormal tumor markers: Secondary | ICD-10-CM | POA: Diagnosis not present

## 2022-08-24 DIAGNOSIS — K769 Liver disease, unspecified: Secondary | ICD-10-CM | POA: Diagnosis not present

## 2022-08-24 DIAGNOSIS — C221 Intrahepatic bile duct carcinoma: Secondary | ICD-10-CM | POA: Insufficient documentation

## 2022-08-24 DIAGNOSIS — D869 Sarcoidosis, unspecified: Secondary | ICD-10-CM | POA: Diagnosis not present

## 2022-08-24 NOTE — Progress Notes (Signed)
[facility] CONSULT NOTE  Patient Care Team: Reubin Milan, MD as PCP - General (Internal Medicine) Glory Buff, RN as Oncology Nurse Navigator  REFERRING PROVIDER: Dr. Moshe Salisbury  REASON FOR REFFERAL: Multiple pulmonary nodules and liver lesion  CANCER STAGING   Cancer Staging  No matching staging information was found for the patient.  ASSESSMENT & PLAN:  Vicki Phillips 57 y.o. female with pmh of sarcoidosis not on any treatment was referred to endocrine oncology for multiple pulmonary nodules and liver lesion.  # Multiple pulmonary nodules # Right upper lobe spiculated nodule # Liver lesion - Patient reports she presented to ED with complaints of chest pain.  Had chest x-ray which showed pulmonary nodules was referred to pulmonary.  Records not available.  Was then seen by Dr. Aundria Rud on 08/05/2022. CT chest showed innumerable pulmonary nodules of varying sizes throughout the lung.  Largest spiculated appearing nodule of anterior right upper lobe measuring 2 x 2 cm, at least 1 ill-defined hypodense lesion of the anterior left lobe of the liver. Despite stated history of sarcoidosis, there are no specific or characteristic features of sarcoidosis with respect to distribution, morphology, associated fibrotic change, nor associated mediastinal lymphadenopathy. 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  -Patient is scheduled for PET/CT tomorrow.  Scheduled for liver biopsy on July 3. -Will obtain MRI brain with and without contrast the staging process.  Patient also reports headaches. -Denies any smoking history.  I briefly discussed with the patient and her 2 sons about concerns for stage IV lung cancer which is not curable but treatable.  Treatment will depend on pathology report.  Also discussed about sending for  NGS testing to assess for targetable mutations.  I will follow-up with her in about 10 days once above results are available.  # Weight loss # Poor appetite -Discussed about nutrition consult and appetite stimulant.  Would like to hold off  # History of sarcoidosis -Management per pulmonary  # Diarrhea -Ongoing for 2 weeks.  Collect stool sample for GI panel and C. difficile.  If negative will consider starting Imodium.  Will complete FMLA paperwork once diagnosis is available.  Orders Placed This Encounter  Procedures   Gastrointestinal Panel by PCR , Stool    Standing Status:   Future    Standing Expiration Date:   08/24/2023   C difficile quick screen w PCR reflex    Standing Status:   Future    Standing Expiration Date:   08/24/2023   MR Brain W Wo Contrast    Standing Status:   Future    Standing Expiration Date:   08/24/2023    Order Specific Question:   If indicated for the ordered procedure, I authorize the administration of contrast media per Radiology protocol    Answer:   Yes    Order Specific Question:   What is the patient's sedation requirement?    Answer:   No Sedation    Order Specific Question:   Does the patient have a pacemaker or implanted devices?    Answer:   No    Order Specific Question:   Use SRS Protocol?    Answer:   No    Order Specific Question:   Preferred imaging location?    Answer:   [facility] (table limit - 550lbs)   RTC on July 12 for MD visit.  The total time spent in the appointment was 55 minutes encounter with patients including review of chart and various tests results, discussions about plan of care and coordination of care plan   All questions were answered. The patient knows to call the clinic with any problems, questions or concerns. No barriers to learning was detected.  Michaelyn Barter, MD 7/1/202412:07 PM   HISTORY OF PRESENTING ILLNESS:  Vicki Phillips 57 y.o. female with pmh of sarcoidosis not on any treatment was  referred to oncology for multiple pulmonary nodules and liver lesion.  Patient reports she presented to ED with complaints of chest pain.  Had chest x-ray which showed pulmonary nodules was referred to pulmonary.  Records not available.  Was then seen by Dr. Aundria Rud on 08/05/2022.  CT chest showed innumerable pulmonary nodules of varying sizes throughout the lung.  Largest spiculated appearing nodule of anterior right upper lobe measuring 2 x 2 cm, at least 1 ill-defined hypodense lesion of the anterior left lobe of the liver.  Despite stated history of sarcoidosis, there are no specific or characteristic features of sarcoidosis with respect to distribution, morphology, associated fibrotic change, nor associated mediastinal lymphadenopathy. 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  She has history of sarcoidosis but was never on treatment.  Reports that she does not see doctors on a regular basis because she is had bad experiences.  I have reviewed her chart and materials related to her cancer extensively and collaborated history with the patient. Summary of oncologic history is as follows: Oncology History   No history exists.    MEDICAL HISTORY:  Past Medical History:  Diagnosis Date   Allergy    Sarcoidosis 2011    SURGICAL HISTORY: Past Surgical History:  Procedure Laterality Date   CESAREAN SECTION     x 2   COLONOSCOPY WITH PROPOFOL N/A 08/10/2019   Procedure: COLONOSCOPY WITH PROPOFOL;  Surgeon: Midge Minium, MD;  Location: [facility];  Service: Endoscopy;  Laterality: N/A;  priority 4   LUNG BIOPSY      SOCIAL HISTORY: Social History   Socioeconomic History   Marital status: Single    Spouse name: Not on file   Number of children: 2   Years of education: Not on file   Highest education level: Not on file   Occupational History   Not on file  Tobacco Use   Smoking status: Never   Smokeless tobacco: Never  Vaping Use   Vaping Use: Never used  Substance and Sexual Activity   Alcohol use: Yes    Alcohol/week: 10.0 standard drinks of alcohol    Types: 10 Standard drinks or equivalent per week   Drug use: No   Sexual activity: Yes    Partners: Male    Birth control/protection: Surgical  Other Topics Concern   Not on file  Social History Narrative   Not on file   Social Determinants of Health   Financial Resource Strain: Not on file  Food Insecurity: No Food Insecurity (08/24/2022)   Hunger Vital Sign    Worried About Running Out of Food in the Last Year: Never true    Ran Out of Food in the Last Year: Never true  Transportation Needs: No Transportation Needs (08/24/2022)   PRAPARE - Administrator, Civil Service (Medical): No    Lack of Transportation (Non-Medical): No  Physical  Activity: Not on file  Stress: Not on file  Social Connections: Not on file  Intimate Partner Violence: Not At Risk (08/24/2022)   Humiliation, Afraid, Rape, and Kick questionnaire    Fear of Current or Ex-Partner: No    Emotionally Abused: No    Physically Abused: No    Sexually Abused: No    FAMILY HISTORY: Family History  Problem Relation Age of Onset   Stroke Mother    Heart disease Mother    Heart attack Father 83   Heart disease Father    Kidney disease Sister     ALLERGIES:  has No Known Allergies.  MEDICATIONS:  Current Outpatient Medications  Medication Sig Dispense Refill   loratadine (CLARITIN) 10 MG tablet Take 10 mg by mouth daily.     pseudoephedrine-acetaminophen (TYLENOL SINUS) 30-500 MG TABS tablet Take 1 tablet by mouth in the morning and at bedtime.     No current facility-administered medications for this visit.    REVIEW OF SYSTEMS:   Pertinent information mentioned in HPI All other systems were reviewed with the patient and are negative.  PHYSICAL  EXAMINATION: ECOG PERFORMANCE STATUS: 1 - Symptomatic but completely ambulatory  Vitals:   08/24/22 1108  BP: (!) 150/97  Pulse: 97  Temp: 98.6 F (37 C)  SpO2: 98%   Filed Weights   08/24/22 1108  Weight: 246 lb (111.6 kg)    GENERAL:alert, no distress and comfortable SKIN: skin color, texture, turgor are normal, no rashes or significant lesions EYES: normal, conjunctiva are pink and non-injected, sclera clear OROPHARYNX:no exudate, no erythema and lips, buccal mucosa, and tongue normal  NECK: supple, thyroid normal size, non-tender, without nodularity LYMPH:  no palpable lymphadenopathy in the cervical, axillary or inguinal LUNGS: clear to auscultation and percussion with normal breathing effort HEART: regular rate & rhythm and no murmurs and no lower extremity edema ABDOMEN:abdomen soft, non-tender and normal bowel sounds Musculoskeletal:no cyanosis of digits and no clubbing  PSYCH: alert & oriented x 3 with fluent speech NEURO: no focal motor/sensory deficits  LABORATORY DATA:  I have reviewed the data as listed Lab Results  Component Value Date   WBC 5.2 09/19/2020   HGB 14.3 09/19/2020   HCT 41.1 09/19/2020   MCV 94.7 09/19/2020   PLT 236 09/19/2020   Recent Labs    08/06/22 1010  NA 140  K 4.3  CL 104  CO2 25  GLUCOSE 100*  BUN 13  CREATININE 0.72  CALCIUM 9.4  GFRNONAA >60  PROT 7.6  ALBUMIN 4.1  AST 38  ALT 28  ALKPHOS 125  BILITOT 0.7    RADIOGRAPHIC STUDIES: I have personally reviewed the radiological images as listed and agreed with the findings in the report. CT CHEST HIGH RESOLUTION  Result Date: 08/18/2022 CLINICAL DATA:  Patient reports history of sarcoidosis, reported abnormal outside chest radiograph with pulmonary nodules suspicious for metastatic disease * Tracking Code: BO * EXAM: CT CHEST WITHOUT CONTRAST TECHNIQUE: Multidetector CT imaging of the chest was performed following the standard protocol without intravenous contrast. High  resolution imaging of the lungs, as well as inspiratory and expiratory imaging, was performed. RADIATION DOSE REDUCTION: This exam was performed according to the departmental dose-optimization program which includes automated exposure control, adjustment of the mA and/or kV according to patient size and/or use of iterative reconstruction technique. COMPARISON:  PET-CT, 11/08/2008, chest radiographs, 10/14/2021 FINDINGS: Cardiovascular: No significant vascular findings. Normal heart size. No pericardial effusion. Mediastinum/Nodes: No enlarged mediastinal, hilar, or axillary lymph  nodes. Thyroid gland, trachea, and esophagus demonstrate no significant findings. Lungs/Pleura: Innumerable pulmonary nodules of varying sizes throughout the lungs. Largest, spiculated appearing nodule of the anterior right upper lobe measures 2.0 x 2.0 cm (series 3, image 53). No pleural effusion or pneumothorax. Upper Abdomen: No acute abnormality. At least one ill-defined hypodense lesion of the anterior left lobe of the liver (series 2, image 146). Musculoskeletal: No chest wall abnormality. No acute osseous findings. IMPRESSION: 1. Innumerable pulmonary nodules of varying sizes throughout the lungs. Largest, spiculated appearing nodule of the anterior right upper lobe measures 2.0 x 2.0 cm. Despite stated history of sarcoidosis, there are no specific or characteristic features of sarcoidosis with respect to distribution, morphology, associated fibrotic change, nor associated mediastinal lymphadenopathy. 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 2. At least one ill-defined hypodense lesion of the anterior left lobe of the liver, presumed hepatic metastatic disease. These results will be called to the ordering clinician or representative by the Radiologist Assistant, and  communication documented in the PACS or [employer]. Electronically Signed   By: Jearld Lesch M.D.   On: 08/18/2022 09:37

## 2022-08-24 NOTE — Progress Notes (Signed)
Met with patient during initial consult with Dr. Agrawal. All questions answered during visit. Reviewed upcoming appts. Contact info given and instructed to call with any questions or needs. Pt verbalized understanding. 

## 2022-08-24 NOTE — Progress Notes (Signed)
Patient started about 6 weeks ago with the following changes:   -No appetite; no joy in eating   -Breathing problems   -neuropathy    -diarrhea   -Dry throat   -Mostly nausea with no vomiting

## 2022-08-25 ENCOUNTER — Ambulatory Visit
Admission: RE | Admit: 2022-08-25 | Discharge: 2022-08-25 | Disposition: A | Discharge status: Home / Self Care | Payer: BC Managed Care – PPO | Source: Ambulatory Visit | Attending: Student in an Organized Health Care Education/Training Program | Admitting: Student in an Organized Health Care Education/Training Program

## 2022-08-25 ENCOUNTER — Other Ambulatory Visit: Payer: Self-pay

## 2022-08-25 ENCOUNTER — Other Ambulatory Visit: Payer: Self-pay | Admitting: Radiology

## 2022-08-25 ENCOUNTER — Ambulatory Visit
Admission: RE | Admit: 2022-08-25 | Discharge: 2022-08-25 | Disposition: A | Discharge status: Home / Self Care | Payer: BC Managed Care – PPO | Source: Ambulatory Visit | Attending: Internal Medicine | Admitting: Internal Medicine

## 2022-08-25 DIAGNOSIS — C799 Secondary malignant neoplasm of unspecified site: Secondary | ICD-10-CM | POA: Diagnosis not present

## 2022-08-25 DIAGNOSIS — R932 Abnormal findings on diagnostic imaging of liver and biliary tract: Secondary | ICD-10-CM | POA: Diagnosis not present

## 2022-08-25 DIAGNOSIS — C801 Malignant (primary) neoplasm, unspecified: Secondary | ICD-10-CM | POA: Diagnosis not present

## 2022-08-25 DIAGNOSIS — C787 Secondary malignant neoplasm of liver and intrahepatic bile duct: Secondary | ICD-10-CM | POA: Diagnosis not present

## 2022-08-25 DIAGNOSIS — K769 Liver disease, unspecified: Secondary | ICD-10-CM | POA: Insufficient documentation

## 2022-08-25 DIAGNOSIS — Z8709 Personal history of other diseases of the respiratory system: Secondary | ICD-10-CM | POA: Diagnosis not present

## 2022-08-25 DIAGNOSIS — R197 Diarrhea, unspecified: Secondary | ICD-10-CM

## 2022-08-25 DIAGNOSIS — R519 Headache, unspecified: Secondary | ICD-10-CM | POA: Insufficient documentation

## 2022-08-25 DIAGNOSIS — R918 Other nonspecific abnormal finding of lung field: Secondary | ICD-10-CM | POA: Insufficient documentation

## 2022-08-25 DIAGNOSIS — C772 Secondary and unspecified malignant neoplasm of intra-abdominal lymph nodes: Secondary | ICD-10-CM | POA: Diagnosis not present

## 2022-08-25 DIAGNOSIS — R413 Other amnesia: Secondary | ICD-10-CM | POA: Diagnosis not present

## 2022-08-25 DIAGNOSIS — K5730 Diverticulosis of large intestine without perforation or abscess without bleeding: Secondary | ICD-10-CM | POA: Diagnosis not present

## 2022-08-25 LAB — GASTROINTESTINAL PANEL BY PCR, STOOL (REPLACES STOOL CULTURE)

## 2022-08-25 LAB — C DIFFICILE QUICK SCREEN W PCR REFLEX
C Diff antigen: NEGATIVE
C Diff interpretation: NOT DETECTED
C Diff toxin: NEGATIVE

## 2022-08-25 LAB — GLUCOSE, CAPILLARY: Glucose-Capillary: 101 mg/dL — ABNORMAL HIGH (ref 70–99)

## 2022-08-25 MED ORDER — FLUDEOXYGLUCOSE F - 18 (FDG) INJECTION
12.6900 | Freq: Once | INTRAVENOUS | Status: AC | PRN
  Administered 2022-08-25: 12.69 via INTRAVENOUS

## 2022-08-25 MED ORDER — IOHEXOL 350 MG/ML SOLN
100.0000 mL | Freq: Once | INTRAVENOUS | Status: AC | PRN
  Administered 2022-08-25: 100 mL via INTRAVENOUS

## 2022-08-25 MED ORDER — GADOBUTROL 1 MMOL/ML IV SOLN
10.0000 mL | Freq: Once | INTRAVENOUS | Status: AC | PRN
  Administered 2022-08-25: 10 mL via INTRAVENOUS

## 2022-08-25 NOTE — Progress Notes (Signed)
Patient for US guided Liver Biopsy on Wed 08/26/2022, I called and spoke with the patient on the phone and gave pre-procedure instructions. Pt was made aware to be here at 9a, NPO after MN prior to procedure as well as driver post procedure/recovery/discharge. Pt stated understanding.  Called 08/25/2022

## 2022-08-26 ENCOUNTER — Ambulatory Visit
Admission: RE | Admit: 2022-08-26 | Discharge: 2022-08-26 | Disposition: A | Discharge status: Home / Self Care | Payer: BC Managed Care – PPO | Source: Ambulatory Visit | Attending: Student in an Organized Health Care Education/Training Program | Admitting: Student in an Organized Health Care Education/Training Program

## 2022-08-26 ENCOUNTER — Other Ambulatory Visit: Payer: Self-pay

## 2022-08-26 DIAGNOSIS — C221 Intrahepatic bile duct carcinoma: Secondary | ICD-10-CM | POA: Diagnosis not present

## 2022-08-26 DIAGNOSIS — K769 Liver disease, unspecified: Secondary | ICD-10-CM

## 2022-08-26 DIAGNOSIS — C801 Malignant (primary) neoplasm, unspecified: Secondary | ICD-10-CM | POA: Diagnosis not present

## 2022-08-26 DIAGNOSIS — C799 Secondary malignant neoplasm of unspecified site: Secondary | ICD-10-CM | POA: Diagnosis not present

## 2022-08-26 DIAGNOSIS — C787 Secondary malignant neoplasm of liver and intrahepatic bile duct: Secondary | ICD-10-CM | POA: Insufficient documentation

## 2022-08-26 DIAGNOSIS — R160 Hepatomegaly, not elsewhere classified: Secondary | ICD-10-CM | POA: Diagnosis not present

## 2022-08-26 LAB — CBC
HCT: 43.5 % (ref 36.0–46.0)
Hemoglobin: 14.5 g/dL (ref 12.0–15.0)
MCH: 33.3 pg (ref 26.0–34.0)
MCHC: 33.3 g/dL (ref 30.0–36.0)
MCV: 100 fL (ref 80.0–100.0)
Platelets: 240 10*3/uL (ref 150–400)
RBC: 4.35 MIL/uL (ref 3.87–5.11)
RDW: 12.9 % (ref 11.5–15.5)
WBC: 4.5 10*3/uL (ref 4.0–10.5)
nRBC: 0 % (ref 0.0–0.2)

## 2022-08-26 MED ORDER — SODIUM CHLORIDE 0.9 % IV SOLN: INTRAVENOUS | Status: DC

## 2022-08-26 MED ORDER — MIDAZOLAM HCL 2 MG/2ML IJ SOLN
INTRAMUSCULAR | Status: AC | PRN
  Administered 2022-08-26: 2 mg via INTRAVENOUS

## 2022-08-26 MED ORDER — MIDAZOLAM HCL 2 MG/2ML IJ SOLN
INTRAMUSCULAR | Status: AC
  Filled 2022-08-26: qty 2

## 2022-08-26 MED ORDER — FENTANYL CITRATE (PF) 100 MCG/2ML IJ SOLN
INTRAMUSCULAR | Status: AC
  Filled 2022-08-26: qty 2

## 2022-08-26 MED ORDER — LIDOCAINE HCL (PF) 1 % IJ SOLN
10.0000 mL | Freq: Once | INTRAMUSCULAR | Status: AC
  Administered 2022-08-26: 10 mL via INTRADERMAL
  Filled 2022-08-26: qty 10

## 2022-08-26 MED ORDER — FENTANYL CITRATE (PF) 100 MCG/2ML IJ SOLN
INTRAMUSCULAR | Status: AC | PRN
  Administered 2022-08-26: 50 ug via INTRAVENOUS

## 2022-08-26 NOTE — Procedures (Signed)
Interventional Radiology Procedure Note  Date of Procedure: 08/26/2022  Procedure: Liver mass biopsy   Findings:  1. US liver mass core needle biopsy    Complications: No immediate complications noted.   Estimated Blood Loss: minimal  Follow-up and Recommendations: 1. Bedrest 2 hours    Olive Bass, MD  Vascular & Interventional Radiology  08/26/2022 11:47 AM

## 2022-08-28 ENCOUNTER — Encounter: Payer: Self-pay | Admitting: *Deleted

## 2022-09-01 ENCOUNTER — Encounter: Payer: Self-pay | Admitting: *Deleted

## 2022-09-01 ENCOUNTER — Telehealth: Payer: Self-pay | Admitting: *Deleted

## 2022-09-01 ENCOUNTER — Other Ambulatory Visit: Payer: Self-pay | Admitting: Pathology

## 2022-09-01 LAB — SURGICAL PATHOLOGY

## 2022-09-01 NOTE — Progress Notes (Signed)
[employer] + PDL1 order submitted via online on recent liver mass biopsy (Accession# [id number]). Request faxed to pathology to send out specimen.

## 2022-09-01 NOTE — Telephone Encounter (Signed)
Received FMLA for this patient, Form partially completed and will send to physician fr signature once treatment plan completed and form completed

## 2022-09-03 ENCOUNTER — Ambulatory Visit: Payer: BC Managed Care – PPO | Admitting: Internal Medicine

## 2022-09-03 ENCOUNTER — Encounter: Payer: Self-pay | Admitting: *Deleted

## 2022-09-03 ENCOUNTER — Inpatient Hospital Stay: Payer: BC Managed Care – PPO

## 2022-09-03 ENCOUNTER — Ambulatory Visit: Payer: BC Managed Care – PPO | Admitting: Student in an Organized Health Care Education/Training Program

## 2022-09-03 ENCOUNTER — Other Ambulatory Visit: Payer: BC Managed Care – PPO

## 2022-09-03 DIAGNOSIS — C221 Intrahepatic bile duct carcinoma: Secondary | ICD-10-CM

## 2022-09-03 DIAGNOSIS — C7802 Secondary malignant neoplasm of left lung: Secondary | ICD-10-CM | POA: Diagnosis not present

## 2022-09-03 DIAGNOSIS — R978 Other abnormal tumor markers: Secondary | ICD-10-CM | POA: Diagnosis not present

## 2022-09-03 DIAGNOSIS — D869 Sarcoidosis, unspecified: Secondary | ICD-10-CM | POA: Diagnosis not present

## 2022-09-03 DIAGNOSIS — C778 Secondary and unspecified malignant neoplasm of lymph nodes of multiple regions: Secondary | ICD-10-CM | POA: Diagnosis not present

## 2022-09-03 DIAGNOSIS — C7800 Secondary malignant neoplasm of unspecified lung: Secondary | ICD-10-CM | POA: Insufficient documentation

## 2022-09-03 DIAGNOSIS — C779 Secondary and unspecified malignant neoplasm of lymph node, unspecified: Secondary | ICD-10-CM | POA: Insufficient documentation

## 2022-09-03 DIAGNOSIS — K769 Liver disease, unspecified: Secondary | ICD-10-CM | POA: Diagnosis not present

## 2022-09-03 DIAGNOSIS — Z79899 Other long term (current) drug therapy: Secondary | ICD-10-CM | POA: Diagnosis not present

## 2022-09-03 DIAGNOSIS — R918 Other nonspecific abnormal finding of lung field: Secondary | ICD-10-CM | POA: Diagnosis not present

## 2022-09-03 DIAGNOSIS — Z5111 Encounter for antineoplastic chemotherapy: Secondary | ICD-10-CM | POA: Diagnosis not present

## 2022-09-03 DIAGNOSIS — C7801 Secondary malignant neoplasm of right lung: Secondary | ICD-10-CM

## 2022-09-03 LAB — CBC WITH DIFFERENTIAL/PLATELET
Abs Immature Granulocytes: 0.02 10*3/uL (ref 0.00–0.07)
Basophils Absolute: 0 10*3/uL (ref 0.0–0.1)
Basophils Relative: 1 %
Eosinophils Absolute: 0.1 10*3/uL (ref 0.0–0.5)
Eosinophils Relative: 2 %
HCT: 44.8 % (ref 36.0–46.0)
Hemoglobin: 15.2 g/dL — ABNORMAL HIGH (ref 12.0–15.0)
Immature Granulocytes: 0 %
Lymphocytes Relative: 15 %
Lymphs Abs: 0.9 10*3/uL (ref 0.7–4.0)
MCH: 33.6 pg (ref 26.0–34.0)
MCHC: 33.9 g/dL (ref 30.0–36.0)
MCV: 99.1 fL (ref 80.0–100.0)
Monocytes Absolute: 0.5 10*3/uL (ref 0.1–1.0)
Monocytes Relative: 8 %
Neutro Abs: 4.5 10*3/uL (ref 1.7–7.7)
Neutrophils Relative %: 74 %
Platelets: 267 10*3/uL (ref 150–400)
RBC: 4.52 MIL/uL (ref 3.87–5.11)
RDW: 12.8 % (ref 11.5–15.5)
WBC: 6.1 10*3/uL (ref 4.0–10.5)
nRBC: 0 % (ref 0.0–0.2)

## 2022-09-03 LAB — COMPREHENSIVE METABOLIC PANEL
ALT: 28 U/L (ref 0–44)
AST: 41 U/L (ref 15–41)
Albumin: 4.5 g/dL (ref 3.5–5.0)
Alkaline Phosphatase: 131 U/L — ABNORMAL HIGH (ref 38–126)
Anion gap: 12 (ref 5–15)
BUN: 12 mg/dL (ref 6–20)
CO2: 24 mmol/L (ref 22–32)
Calcium: 9.3 mg/dL (ref 8.9–10.3)
Chloride: 102 mmol/L (ref 98–111)
Creatinine, Ser: 0.62 mg/dL (ref 0.44–1.00)
GFR, Estimated: >60 mL/min (ref >60)
Glucose, Bld: 104 mg/dL — ABNORMAL HIGH (ref 70–99)
Potassium: 3.8 mmol/L (ref 3.5–5.1)
Sodium: 138 mmol/L (ref 135–145)
Total Bilirubin: 0.7 mg/dL (ref 0.3–1.2)
Total Protein: 8.1 g/dL (ref 6.5–8.1)

## 2022-09-03 MED ORDER — PANCRELIPASE (LIP-PROT-AMYL) 36000-114000 UNITS PO CPEP: ORAL_CAPSULE | ORAL | 1 refills | Status: DC

## 2022-09-03 NOTE — Telephone Encounter (Signed)
FMLA completed and faxed to [employer], [employer] of receipt obtained . Copied form for chart and given to HIM

## 2022-09-03 NOTE — Progress Notes (Signed)
Tumor Board Encounter for 09/03/22. Please see MDT review.

## 2022-09-03 NOTE — Progress Notes (Signed)
Patient is still having the chest pain with cough, she has not appetite. Her whole body is itchy.

## 2022-09-03 NOTE — Progress Notes (Signed)
[facility] CONSULT NOTE  Patient Care Team: Reubin Milan, MD as PCP - General (Internal Medicine) Glory Buff, RN as Oncology Nurse Navigator Michaelyn Barter, MD as Consulting Physician (Oncology)   CANCER STAGING   Cancer Staging  Cholangiocarcinoma [facility]) Staging form: Intrahepatic Bile Duct, AJCC 8th Edition - Clinical stage from 08/26/2022: Stage IV (cM1) - Signed by Michaelyn Barter, MD on 09/03/2022 Stage prefix: Initial diagnosis   ASSESSMENT & PLAN:  Vicki Phillips 57 y.o. 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  # Cholangiocarcinoma, stage IV - Patient reports she presented to ED with complaints of chest pain.  Had chest x-ray which showed pulmonary nodules was referred to pulmonary.  Records not available.  Was then seen by Dr. Aundria Rud on 08/05/2022. CT chest showed innumerable pulmonary nodules of varying sizes throughout the lung.  Largest spiculated appearing nodule of anterior right upper lobe measuring 2 x 2 cm, at least 1 ill-defined hypodense lesion of the anterior left lobe of the liver.   -PET/CT from 7-24 showed large tumor burden with numerous pulmonary nodules bilaterally.  Index lesion measuring 2 cm in the left upper lobe SUV 8.3, subpleural lateral left lower lobe measuring 1.5 cm SUV 10.2, posterior right base 2 cm SUV 5.5, left supraclavicular lymph node 8 mm SUV 4.5, numerous liver masses, dominant lesion 8 cm in segment 4B SUV 12.7 with additional lesions in segment 5 6 and 4a, multiple abdominal lymph nodes are identified.  Pancreas looks normal.  Tracer avid sclerotic lesion within T3 SUV 4.5.  - s/p liver biopsy on 08/26/2022 showed metastatic moderately differentiated adenocarcinoma.  IHC stain positive for CK7 and CK20.  TTF-1, PAX8, GATA3 and CDX2 are negative.  Differentials include cholangiocarcinoma and pancreatic carcinoma.  Pancreatic cancer is less  likely since it looks normal on the imaging. -I will obtain tumor markers CA 19-9, CEA and AFP levels to support the biopsy results.  -MRI brain with and without contrast done for headaches was negative for metastasis.  -I discussed with the patient, her 2 sons about the diagnosis, staging, treatment options and prognosis.  She has stage IV and goal of treatment is palliative.  She has high tumor burden and concern for progressive disease.  Discussed about treatment regimen with gemcitabine, cisplatin and durvalumab based on TOPAZ-1 trial.  Cisplatin and gemcitabine will be day 1 and day 8 with Durvalumab on day 1 every 3 weeks.  Plan to repeat CT imaging after 4 cycles to assess treatment response.  Side effects such as decreased blood count, increased risk of infection, anemia, need for blood transfusion, neuropathy, renal dysfunction, nausea, vomiting, fatigue, lack of appetite, neuropathy was all discussed.  -Liver biopsy sent for foundation 1 testing. -Port placement.  Chemo teach.  METHODS In this double-blind, placebo-controlled, phase 3 study, we randomly assigned patients with previously untreated unresectable or metastatic biliary tract cancer or with recurrent disease 1:1 to receive durvalumab or placebo in combination with gemcitabine plus cisplatin for up to eight cycles, followed by durvalumab or placebo monotherapy until disease progression or unacceptable toxicity. The primary objective was to assess overall survival. Secondary end points included progression-free survival, objective response rate, and safety. RESULTS Overall, 685 patients were randomly assigned to durvalumab (n=341) or placebo (n=344) with chemotherapy. As of data cutoff, 198 patients (58.1%) in the durvalumab group and 226 patients (65.7%) in the placebo group had died. The hazard ratio for overall  survival was 0.80 (95% confidence interval [CI], 0.66 to 0.97; P=0.021). The estimated 95-month overall survival rate was  24.9% (95% CI, 17.9 to 32.5) for durvalumab and 10.4% (95% CI, 4.7 to 18.8) for placebo. The hazard ratio for progression-free survival was 0.75 (95% CI, 0.63 to 0.89; P=0.001). Objective response rates were 26.7% with durvalumab and 18.7% with placebo. The incidences of grade 3 or 4 adverse events were 75.7% and 77.8% with durvalumab and placebo, respectively. CONCLUSIONS Durvalumab plus chemotherapy significantly improved overall survival versus placebo plus chemotherapy and showed improvements versus placebo plus chemotherapy in prespecified secondary end points including progression-free survival and objective response rate. The safety profiles of the two treatment groups were similar.  # Weight loss # Poor appetite -Discussed about nutrition consult and appetite stimulant.  Would like to hold off  # History of sarcoidosis -Management per pulmonary  # Changes with bowel movement -Diarrhea has resolved.  Stools are soft and occurs after every meal.  I would like to do a trial of Creon 1 tablet (36,000 U) with meals.  And assess for response. -GI panel and C. difficile was negative.  FMLA paperwork completed.  Orders Placed This Encounter  Procedures   IR IMAGING GUIDED PORT INSERTION    Standing Status:   Future    Standing Expiration Date:   09/03/2023    Order Specific Question:   Reason for Exam (SYMPTOM  OR DIAGNOSIS REQUIRED)    Answer:   new treatment start    Order Specific Question:   Is the patient pregnant?    Answer:   No    Order Specific Question:   Preferred Imaging Location?    Answer:   [facility]   Cancer antigen 19-9    Standing Status:   Future    Number of Occurrences:   1    Standing Expiration Date:   09/03/2023   CEA    Standing Status:   Future    Number of Occurrences:   1    Standing Expiration Date:   09/03/2023   AFP tumor marker    Standing Status:   Future    Number of Occurrences:   1    Standing Expiration Date:   09/03/2023   CBC with  Differential/Platelet    Standing Status:   Future    Number of Occurrences:   1    Standing Expiration Date:   09/03/2023   Comprehensive metabolic panel    Standing Status:   Future    Number of Occurrences:   1    Standing Expiration Date:   09/03/2023   RTC in about 2 weeks for MD visit, labs, cycle 1 of cisplatin, gemcitabine, Durva  The total time spent in the appointment was 45 minutes encounter with patients including review of chart and various tests results, discussions about plan of care and coordination of care plan   All questions were answered. The patient knows to call the clinic with any problems, questions or concerns. No barriers to learning was detected.  Michaelyn Barter, MD 7/11/202412:00 PM   HISTORY OF PRESENTING ILLNESS:  Vicki Phillips 57 y.o. 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  Patient reports she presented to ED with complaints of chest pain.  Had chest x-ray which showed pulmonary nodules was referred to pulmonary.  Records not available.  Was then seen by Dr. Aundria Rud on 08/05/2022.  CT chest showed innumerable pulmonary  nodules of varying sizes throughout the lung.  Largest spiculated appearing nodule of anterior right upper lobe measuring 2 x 2 cm, at least 1 ill-defined hypodense lesion of the anterior left lobe of the liver.  Despite stated history of sarcoidosis, there are no specific or characteristic features of sarcoidosis with respect to distribution, morphology, associated fibrotic change, nor associated mediastinal lymphadenopathy. 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  She has history of sarcoidosis but was never on treatment.  Reports that she does not see doctors on a regular basis because she is had  bad experiences.  Interval history Patient seen today accompanied with her 2 sons to discuss the imaging results and biopsy results. Denies any new changes since the last visit.  Her stools are soft and occurs after every meal with multiple episodes.  I have reviewed her chart and materials related to her cancer extensively and collaborated history with the patient. Summary of oncologic history is as follows: Oncology History  Cholangiocarcinoma (HCC)  08/26/2022 Cancer Staging   Staging form: Intrahepatic Bile Duct, AJCC 8th Edition - Clinical stage from 08/26/2022: Stage IV (cM1) - Signed by Michaelyn Barter, MD on 09/03/2022 Stage prefix: Initial diagnosis   09/03/2022 Initial Diagnosis   Cholangiocarcinoma [facility])     MEDICAL HISTORY:  Past Medical History:  Diagnosis Date   Allergy    Sarcoidosis 2011    SURGICAL HISTORY: Past Surgical History:  Procedure Laterality Date   CESAREAN SECTION     x 2   COLONOSCOPY WITH PROPOFOL N/A 08/10/2019   Procedure: COLONOSCOPY WITH PROPOFOL;  Surgeon: Midge Minium, MD;  Location: [facility];  Service: Endoscopy;  Laterality: N/A;  priority 4   LUNG BIOPSY      SOCIAL HISTORY: Social History   Socioeconomic History   Marital status: Single    Spouse name: Not on file   Number of children: 2   Years of education: Not on file   Highest education level: Not on file  Occupational History   Not on file  Tobacco Use   Smoking status: Never   Smokeless tobacco: Never  Vaping Use   Vaping status: Never Used  Substance and Sexual Activity   Alcohol use: Yes    Alcohol/week: 10.0 standard drinks of alcohol    Types: 10 Standard drinks or equivalent per week   Drug use: No   Sexual activity: Yes    Partners: Male    Birth control/protection: Surgical  Other Topics Concern   Not on file  Social History Narrative   Not on file   Social Determinants of Health   Financial Resource Strain: Not on file  Food Insecurity: No  Food Insecurity (08/24/2022)   Hunger Vital Sign    Worried About Running Out of Food in the Last Year: Never true    Ran Out of Food in the Last Year: Never true  Transportation Needs: No Transportation Needs (08/24/2022)   PRAPARE - Administrator, Civil Service (Medical): No    Lack of Transportation (Non-Medical): No  Physical Activity: Not on file  Stress: Not on file  Social Connections: Not on file  Intimate Partner Violence: Not At Risk (08/24/2022)   Humiliation, Afraid, Rape, and Kick questionnaire    Fear of Current or Ex-Partner: No    Emotionally Abused: No    Physically Abused: No  Sexually Abused: No    FAMILY HISTORY: Family History  Problem Relation Age of Onset   Stroke Mother    Heart disease Mother    Heart attack Father 68   Heart disease Father    Kidney disease Sister     ALLERGIES:  is allergic to other.  MEDICATIONS:  Current Outpatient Medications  Medication Sig Dispense Refill   lipase/protease/amylase (CREON) 36000 UNITS CPEP capsule Take 1 capsule (36,000 Units total) by mouth 3 (three) times daily with meals. May also take 1 capsule (36,000 Units total) as needed (with snacks). 240 capsule 1   loratadine (CLARITIN) 10 MG tablet Take 10 mg by mouth daily. (Patient not taking: Reported on 09/03/2022)     pseudoephedrine-acetaminophen (TYLENOL SINUS) 30-500 MG TABS tablet Take 1 tablet by mouth in the morning and at bedtime. (Patient not taking: Reported on 09/03/2022)     No current facility-administered medications for this visit.    REVIEW OF SYSTEMS:   Pertinent information mentioned in HPI All other systems were reviewed with the patient and are negative.  PHYSICAL EXAMINATION: ECOG PERFORMANCE STATUS: 1 - Symptomatic but completely ambulatory  There were no vitals filed for this visit.  There were no vitals filed for this visit.   GENERAL:alert, no distress and comfortable SKIN: skin color, texture, turgor are normal, no  rashes or significant lesions EYES: normal, conjunctiva are pink and non-injected, sclera clear OROPHARYNX:no exudate, no erythema and lips, buccal mucosa, and tongue normal  NECK: supple, thyroid normal size, non-tender, without nodularity LYMPH:  no palpable lymphadenopathy in the cervical, axillary or inguinal LUNGS: clear to auscultation and percussion with normal breathing effort HEART: regular rate & rhythm and no murmurs and no lower extremity edema ABDOMEN:abdomen soft, non-tender and normal bowel sounds Musculoskeletal:no cyanosis of digits and no clubbing  PSYCH: alert & oriented x 3 with fluent speech NEURO: no focal motor/sensory deficits  LABORATORY DATA:  I have reviewed the data as listed Lab Results  Component Value Date   WBC 6.1 09/03/2022   HGB 15.2 (H) 09/03/2022   HCT 44.8 09/03/2022   MCV 99.1 09/03/2022   PLT 267 09/03/2022   Recent Labs    08/06/22 1010 09/03/22 1103  NA 140 138  K 4.3 3.8  CL 104 102  CO2 25 24  GLUCOSE 100* 104*  BUN 13 12  CREATININE 0.72 0.62  CALCIUM 9.4 9.3  GFRNONAA >60 >60  PROT 7.6 8.1  ALBUMIN 4.1 4.5  AST 38 41  ALT 28 28  ALKPHOS 125 131*  BILITOT 0.7 0.7    RADIOGRAPHIC STUDIES: I have personally reviewed the radiological images as listed and agreed with the findings in the report. CT ABDOMEN PELVIS W CONTRAST  Result Date: 08/31/2022 CLINICAL DATA:  57 year old female with history of multiple pulmonary nodules. Evaluate for source of malignancy. * Tracking Code: BO * EXAM: CT ABDOMEN AND PELVIS WITH CONTRAST TECHNIQUE: Multidetector CT imaging of the abdomen and pelvis was performed using the standard protocol following bolus administration of intravenous contrast. RADIATION DOSE REDUCTION: This exam was performed according to the departmental dose-optimization program which includes automated exposure control, adjustment of the mA and/or kV according to patient size and/or use of iterative reconstruction technique.  CONTRAST:  OMNIPAQUE IOHEXOL 350 MG/ML SOLN COMPARISON:  PET-CT 08/25/2022. FINDINGS: Lower chest: Innumerable pulmonary nodules are again noted, better demonstrated on prior chest CT, likely reflective of widespread metastatic disease to the lungs. Hepatobiliary: Multiple hypovascular hepatic lesions are noted, largest of  which is centered in segments 4A and 4B (axial image 24 of series 2 and coronal image 31 of series 5) measuring approximately 8.0 x 7.1 x 7.2 cm. Several other smaller lesions are noted, largest of which is in the inferior aspect of segment 5 (axial image 35 of series 2) measuring 1.5 x 1.2 cm. No intra or extrahepatic biliary ductal dilatation. Gallbladder is only mildly distended, but filled with high attenuation material. No pericholecystic fluid or surrounding inflammatory changes. Pancreas: No pancreatic mass. No pancreatic ductal dilatation. No pancreatic or peripancreatic fluid collections or inflammatory changes. Spleen: Unremarkable. Adrenals/Urinary Tract: Bilateral kidneys and adrenal glands are normal in appearance. No hydroureteronephrosis. Urinary bladder is normal in appearance. Stomach/Bowel: The appearance of the stomach is normal. No pathologic dilatation of small bowel or colon. Numerous colonic diverticuli are noted, without surrounding inflammatory changes to suggest an acute diverticulitis at this time. Normal appendix. Vascular/Lymphatic: No atherosclerotic calcifications, aneurysm or dissection noted in the abdominal or pelvic vasculature. Numerous prominent borderline enlarged and mildly enlarged lymph nodes are noted in the upper abdomen and retroperitoneum, including a 1.3 cm short axis portacaval lymph node (axial image 26 of series 2) and numerous para-aortic lymph nodes measuring up to 1.2 cm in short axis (axial image 33 of series 2) adjacent to the left renal hilum. No definite pelvic lymphadenopathy. Reproductive: Uterus and ovaries are unremarkable in  appearance. Other: No significant volume of ascites.  No pneumoperitoneum. Musculoskeletal: There are no aggressive appearing lytic or blastic lesions noted in the visualized portions of the skeleton. IMPRESSION: 1. Multiple liver lesions, most notable for a dominant lesion centered in segments 4A and 4B of the liver estimated to measure approximately 8.0 x 7.1 x 7.2 cm. These lesions are hypovascular and nonspecific in appearance. Whether this is a primary hepatic lesion with multifocal hepatic metastases or are all metastatic liver lesions is uncertain on today's examination. Tissue sampling should be considered if clinically appropriate. 2. Metastatic lymphadenopathy noted in the upper abdomen, similar to prior PET-CT. 3. Numerous pulmonary nodules indicative of metastatic disease to the lungs. 4. Colonic diverticulosis without evidence of acute diverticulitis at this time. Electronically Signed   By: Trudie Reed M.D.   On: 08/31/2022 08:39   NM PET Image Initial (PI) Skull Base To Thigh (F-18 FDG)  Result Date: 08/30/2022 CLINICAL DATA:  Initial treatment strategy for pulmonary nodules. EXAM: NUCLEAR MEDICINE PET SKULL BASE TO THIGH TECHNIQUE: 12.69 mCi F-18 FDG was injected intravenously. Full-ring PET imaging was performed from the skull base to thigh after the radiotracer. CT data was obtained and used for attenuation correction and anatomic localization. Fasting blood glucose: 101 mg/dl COMPARISON:  CT chest 16/11/9602 FINDINGS: Mediastinal blood pool activity: SUV max 2.60 Liver activity: SUV max NA NECK: No hypermetabolic lymph nodes in the neck. Incidental CT findings: None. CHEST: Too numerous to count tracer avid pulmonary nodules are scattered throughout both lungs. -index lesion within the left upper lobe measures 2 cm with SUV max of 8.39, image 50/4. -Index lesion within the subpleural, lateral left upper lobe measures 1.5 cm with SUV max of 10.25, image 49/4. -index lesion in the posterior  right base measures 2 cm with SUV max of 5.52, image 73/4. No tracer avid mediastinal or hilar lymph nodes. -Left supraclavicular lymph node measures 8 mm with SUV max 4.53, image 32/4. Incidental CT findings: Mild cardiac enlargement. ABDOMEN/PELVIS: Segment 4B liver masses tracer avid measuring 7.6 x 8.0 cm with SUV max of 12.73, image 88/4. There  are additional, smaller tracer avid lesions noted in segment 5, 6 and segment 4a. No abnormal tracer uptake identified within the pancreas, spleen, or adrenal glands. Multiple tracer avid abdominal lymph nodes are identified. Index pre caval lymph node measures 6 mm with SUV max 7.32, image 92/4. Index left retroperitoneal lymph node measures 1 cm with SUV max of 5.81, image 99/4. No tracer avid pelvic or inguinal lymph nodes. Incidental CT findings: High attenuation material identified within the gallbladder which may reflect tumefactive sludge. SKELETON: Tracer avid sclerotic lesions identified within the T3 vertebra with SUV max 4.56, image 42/4. No additional tracer avid bone lesions identified. Incidental CT findings: None. IMPRESSION: 1. Too numerous to count tracer avid pulmonary nodules are identified throughout both lungs compatible with diffuse metastasis. 2. Tracer avid left supraclavicular and abdominal lymph nodes are identified compatible with nodal metastasis. 3. Multiple tracer avid liver lesions are identified. Dominant tracer avid liver metastasis is in segment 4B measuring 8 cm with SUV max of 12.73. 4. Tracer avid sclerotic lesion identified within the T3 vertebra. Electronically Signed   By: Signa Kell M.D.   On: 08/30/2022 11:23   US BIOPSY (LIVER)  Result Date: 08/26/2022 INDICATION: Liver mass EXAM: Ultrasound-guided core needle biopsy of liver mass MEDICATIONS: None. ANESTHESIA/SEDATION: Moderate (conscious) sedation was employed during this procedure. A total of Versed 2 mg and Fentanyl 50 mcg was administered intravenously by the  radiology nurse. Total intra-service moderate Sedation Time: 10 minutes. The patient's level of consciousness and vital signs were monitored continuously by radiology nursing throughout the procedure under my direct supervision. COMPLICATIONS: None immediate. PROCEDURE: Informed written consent was obtained from the patient after a thorough discussion of the procedural risks, benefits and alternatives. All questions were addressed. Maximal Sterile Barrier Technique was utilized including caps, mask, sterile gowns, sterile gloves, sterile drape, hand hygiene and skin antiseptic. A timeout was performed prior to the initiation of the procedure. The patient was placed supine on the exam table. Ultrasound of the liver demonstrated large hypoechoic lesion centrally located in the liver. Skin entry site was marked, and the overlying skin was prepped draped in the standard sterile fashion. Local analgesia was obtained with 1% lidocaine. Using ultrasound guidance, a 17 gauge introducer needle was advanced towards the identified lesion in the central portion of the liver. Subsequently, core needle biopsy was performed of the central liver lesion using an 18 gauge core biopsy device x4 total passes. Specimens were submitted in formalin to pathology for further handling. Limited postprocedure imaging demonstrated no hematoma. A clean dressing was placed after manual hemostasis. The patient tolerated the procedure well without immediate complication. IMPRESSION: Successful ultrasound-guided core needle biopsy of large lesion centrally located within the liver. Electronically Signed   By: Olive Bass M.D.   On: 08/26/2022 14:20   MR Brain W Wo Contrast  Result Date: 08/25/2022 CLINICAL DATA:  Metastatic disease evaluation, headaches and some memory loss EXAM: MRI HEAD WITHOUT AND WITH CONTRAST TECHNIQUE: Multiplanar, multiecho pulse sequences of the brain and surrounding structures were obtained without and with  intravenous contrast. CONTRAST:  10mL GADAVIST GADOBUTROL 1 MMOL/ML IV SOLN COMPARISON:  No prior MRI available, correlation is made with 06/18/2006 CT head FINDINGS: Brain: No restricted diffusion to suggest acute or subacute infarct. No abnormal parenchymal or meningeal enhancement. No acute hemorrhage, mass, mass effect, or midline shift. No hydrocephalus or extra-axial collection. Normal pituitary and craniocervical junction. No hemosiderin deposition to suggest remote hemorrhage. Normal cerebral volume for age. Scattered T2  hyperintense signal in the periventricular white matter, likely the sequela of mild chronic small vessel ischemic disease. Vascular: Normal arterial flow voids. Normal arterial and venous enhancement. Skull and upper cervical spine: Normal marrow signal. Sinuses/Orbits: Clear paranasal sinuses. No acute finding in the orbits. Other: The mastoid air cells are well aerated. IMPRESSION: No acute intracranial process. No evidence of metastatic disease in the brain. Electronically Signed   By: Wiliam Ke M.D.   On: 08/25/2022 15:39   CT CHEST HIGH RESOLUTION  Result Date: 08/18/2022 CLINICAL DATA:  Patient reports history of sarcoidosis, reported abnormal outside chest radiograph with pulmonary nodules suspicious for metastatic disease * Tracking Code: BO * EXAM: CT CHEST WITHOUT CONTRAST TECHNIQUE: Multidetector CT imaging of the chest was performed following the standard protocol without intravenous contrast. High resolution imaging of the lungs, as well as inspiratory and expiratory imaging, was performed. RADIATION DOSE REDUCTION: This exam was performed according to the departmental dose-optimization program which includes automated exposure control, adjustment of the mA and/or kV according to patient size and/or use of iterative reconstruction technique. COMPARISON:  PET-CT, 11/08/2008, chest radiographs, 10/14/2021 FINDINGS: Cardiovascular: No significant vascular findings. Normal  heart size. No pericardial effusion. Mediastinum/Nodes: No enlarged mediastinal, hilar, or axillary lymph nodes. Thyroid gland, trachea, and esophagus demonstrate no significant findings. Lungs/Pleura: Innumerable pulmonary nodules of varying sizes throughout the lungs. Largest, spiculated appearing nodule of the anterior right upper lobe measures 2.0 x 2.0 cm (series 3, image 53). No pleural effusion or pneumothorax. Upper Abdomen: No acute abnormality. At least one ill-defined hypodense lesion of the anterior left lobe of the liver (series 2, image 146). Musculoskeletal: No chest wall abnormality. No acute osseous findings. IMPRESSION: 1. Innumerable pulmonary nodules of varying sizes throughout the lungs. Largest, spiculated appearing nodule of the anterior right upper lobe measures 2.0 x 2.0 cm. Despite stated history of sarcoidosis, there are no specific or characteristic features of sarcoidosis with respect to distribution, morphology, associated fibrotic change, nor associated mediastinal lymphadenopathy. 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 2. At least one ill-defined hypodense lesion of the anterior left lobe of the liver, presumed hepatic metastatic disease. These results will be called to the ordering clinician or representative by the Radiologist Assistant, and communication documented in the PACS or [employer]. Electronically Signed   By: Jearld Lesch M.D.   On: 08/18/2022 09:37

## 2022-09-03 NOTE — Progress Notes (Signed)
Met with patient during follow up visit with Dr. Alena Bills. All questions answered during visit. Reviewed upcoming appts. Informed that will be called with appts for port placement and chemo start. Informed that FMLA paperwork will be completed today and faxed. Instructed pt to call with any questions or needs. Pt verbalized understanding.

## 2022-09-04 ENCOUNTER — Inpatient Hospital Stay: Payer: BC Managed Care – PPO | Admitting: Internal Medicine

## 2022-09-04 ENCOUNTER — Encounter: Payer: Self-pay | Admitting: *Deleted

## 2022-09-04 ENCOUNTER — Encounter: Payer: Self-pay | Admitting: Internal Medicine

## 2022-09-04 LAB — CANCER ANTIGEN 19-9: CA 19-9: 352 U/mL — ABNORMAL HIGH (ref 0–35)

## 2022-09-04 LAB — AFP TUMOR MARKER: AFP, Serum, Tumor Marker: 4.2 ng/mL (ref 0.0–9.2)

## 2022-09-04 LAB — CEA: CEA: 1.2 ng/mL (ref 0.0–4.7)

## 2022-09-04 NOTE — Progress Notes (Signed)
START OFF PATHWAY REGIMEN - Other ° ° °[id number]:Cisplatin IV D1,8 + Durvalumab 1,500 mg IV D1 + Gemcitabine IV D1,8 q21 Days for up to 8 Cycles Followed by Durvalumab 1,500 mg IV D1 q28 Days: °  Cycles 1 through up to 8: A cycle is every 21 days: °    Durvalumab  °    Gemcitabine  °    Cisplatin  °  Cycles 9 and beyond: A cycle is every 28 days: °    Durvalumab  ° °**Always confirm dose/schedule in your pharmacy ordering system** ° °Patient Characteristics: °Intent of Therapy: °Non-Curative / Palliative Intent, Discussed with Patient °

## 2022-09-04 NOTE — Addendum Note (Signed)
Addended byMichaelyn Barter on: 09/04/2022 03:07 PM   Modules accepted: Orders

## 2022-09-05 ENCOUNTER — Other Ambulatory Visit: Payer: Self-pay

## 2022-09-07 ENCOUNTER — Inpatient Hospital Stay: Payer: BC Managed Care – PPO

## 2022-09-08 ENCOUNTER — Other Ambulatory Visit: Payer: Self-pay

## 2022-09-09 ENCOUNTER — Ambulatory Visit: Payer: BC Managed Care – PPO | Admitting: Student in an Organized Health Care Education/Training Program

## 2022-09-09 ENCOUNTER — Inpatient Hospital Stay: Payer: BC Managed Care – PPO | Admitting: Hospice and Palliative Medicine

## 2022-09-09 ENCOUNTER — Other Ambulatory Visit: Payer: Self-pay

## 2022-09-09 DIAGNOSIS — C221 Intrahepatic bile duct carcinoma: Secondary | ICD-10-CM

## 2022-09-09 NOTE — Progress Notes (Signed)
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 The MOC considered available treatment options and made the following recommendations/referrals:  SW, nutrition, consider palliative care  The MOC is a meeting of clinicians from various specialty areas who evaluate and discuss patients for whom a multidisciplinary approach is being considered. Final determinations in the plan of care are those of the provider(s).   Today's extended care, comprehensive team conference, Rubee was not present for the discussion and was not examined.

## 2022-09-10 ENCOUNTER — Other Ambulatory Visit: Payer: Self-pay | Admitting: Radiology

## 2022-09-10 DIAGNOSIS — Z01812 Encounter for preprocedural laboratory examination: Secondary | ICD-10-CM

## 2022-09-10 NOTE — Progress Notes (Signed)
Patient for IR Port Insertion on Friday 09/11/2022, I called and spoke with the patient on the phone and gave pre-procedure instructions. Pt was made aware to be here at 12:30p, NPO after MN prior to procedure as well as driver post procedure/recovery/discharge. Pt stated understanding.  Called 09/10/2022

## 2022-09-11 ENCOUNTER — Encounter: Payer: Self-pay | Admitting: Radiology

## 2022-09-11 ENCOUNTER — Other Ambulatory Visit: Payer: Self-pay

## 2022-09-11 ENCOUNTER — Ambulatory Visit
Admission: RE | Admit: 2022-09-11 | Discharge: 2022-09-11 | Disposition: A | Discharge status: Home / Self Care | Payer: BC Managed Care – PPO | Source: Ambulatory Visit | Attending: Internal Medicine | Admitting: Internal Medicine

## 2022-09-11 DIAGNOSIS — C221 Intrahepatic bile duct carcinoma: Secondary | ICD-10-CM | POA: Diagnosis not present

## 2022-09-11 DIAGNOSIS — Z452 Encounter for adjustment and management of vascular access device: Secondary | ICD-10-CM | POA: Diagnosis not present

## 2022-09-11 DIAGNOSIS — Z01812 Encounter for preprocedural laboratory examination: Secondary | ICD-10-CM

## 2022-09-11 HISTORY — PX: IR IMAGING GUIDED PORT INSERTION: IMG5740

## 2022-09-11 MED ORDER — LIDOCAINE-EPINEPHRINE 1 %-1:100000 IJ SOLN
9.0000 mL | Freq: Once | INTRAMUSCULAR | Status: AC
  Administered 2022-09-11: 9 mL via INTRADERMAL

## 2022-09-11 MED ORDER — SODIUM CHLORIDE 0.9 % IV SOLN: INTRAVENOUS | Status: DC

## 2022-09-11 MED ORDER — HEPARIN SOD (PORK) LOCK FLUSH 100 UNIT/ML IV SOLN
500.0000 [IU] | Freq: Once | INTRAVENOUS | Status: AC
  Administered 2022-09-11: 500 [IU] via INTRAVENOUS

## 2022-09-11 MED ORDER — MIDAZOLAM HCL 2 MG/2ML IJ SOLN
INTRAMUSCULAR | Status: AC | PRN
  Administered 2022-09-11 (×2): 1 mg via INTRAVENOUS

## 2022-09-11 MED ORDER — FENTANYL CITRATE (PF) 100 MCG/2ML IJ SOLN
INTRAMUSCULAR | Status: AC | PRN
  Administered 2022-09-11 (×2): 50 ug via INTRAVENOUS

## 2022-09-11 NOTE — Procedures (Signed)
Interventional Radiology Procedure Note  Date of Procedure: 09/11/2022  Procedure: Port placement   Findings:  1. Right chest port placement    Complications: No immediate complications noted.   Estimated Blood Loss: minimal  Follow-up and Recommendations: 1. Ready for use    Olive Bass, MD  Vascular & Interventional Radiology  09/11/2022 2:47 PM

## 2022-09-14 ENCOUNTER — Inpatient Hospital Stay: Payer: BC Managed Care – PPO

## 2022-09-14 NOTE — Progress Notes (Signed)
[facility] Clinical Social Work  Initial Assessment   Vicki Phillips is a 57 y.o. year old female. Clinical Social Work was referred by medical provider for assessment of psychosocial needs.   SDOH (Social Determinants of Health) assessments performed: Yes   SDOH Screenings   Food Insecurity: No Food Insecurity (08/24/2022)  Housing: Low Risk  (08/24/2022)  Transportation Needs: No Transportation Needs (08/24/2022)  Utilities: Not At Risk (08/24/2022)  Depression (PHQ2-9): Low Risk  (08/24/2022)  Tobacco Use: Low Risk  (09/11/2022)     Distress Screen completed: No     No data to display            Family/Social Information:  Housing Arrangement: patient lives alone Family members/support persons in your life? Family, Friends, and Acupuncturist.  She has a son who is 66 y/o and a son who is 65 y/o.  The youngest son lives with his father who resides close to patient.  They have a good relationship. Transportation concerns: no  Employment: Working full time at [employer].  She is an Art gallery manager.  Income source: Employment Financial concerns: No Type of concern: None Food access concerns: no Religious or spiritual practice: No Services Currently in place:  [employer]BS  Coping/ Adjustment to diagnosis: Patient understands treatment plan and what happens next? yes Concerns about diagnosis and/or treatment: Afraid of cancer and her death. Patient reported stressors: Adjusting to my illness and Facing my mortality Hopes and/or priorities: family Patient enjoys time with family/ friends Current coping skills/ strengths: Ability for insight , Active sense of humor , Average or above average intelligence , Capable of independent living , Communication skills , Contractor , General fund of knowledge , Motivation for treatment/growth , Supportive family/friends , and Work skills     SUMMARY: Current SDOH Barriers:  None per patient.  Clinical Social Work Clinical Goal(s):  Demonstrate  a reduction in symptoms related to :Anxiety   Interventions: Discussed common feeling and emotions when being diagnosed with cancer, and the importance of support during treatment Informed patient of the support team roles and support services at [facility] Provided CSW contact information and encouraged patient to call with any questions or concerns Provided patient with information about role of CSW.  Provided active listening and supportive counseling.   Follow Up Plan: CSW will see patient on Friday, 7/26 while she is in infusion. Patient verbalizes understanding of plan: Yes    Vicki Lux [name], LCSW Clinical Social Worker [facility]

## 2022-09-15 DIAGNOSIS — C7801 Secondary malignant neoplasm of right lung: Secondary | ICD-10-CM | POA: Diagnosis not present

## 2022-09-15 DIAGNOSIS — C221 Intrahepatic bile duct carcinoma: Secondary | ICD-10-CM | POA: Diagnosis not present

## 2022-09-15 DIAGNOSIS — R197 Diarrhea, unspecified: Secondary | ICD-10-CM | POA: Diagnosis not present

## 2022-09-15 DIAGNOSIS — C7802 Secondary malignant neoplasm of left lung: Secondary | ICD-10-CM | POA: Diagnosis not present

## 2022-09-15 DIAGNOSIS — R058 Other specified cough: Secondary | ICD-10-CM | POA: Diagnosis not present

## 2022-09-17 ENCOUNTER — Other Ambulatory Visit: Payer: BC Managed Care – PPO

## 2022-09-17 ENCOUNTER — Inpatient Hospital Stay (HOSPITAL_BASED_OUTPATIENT_CLINIC_OR_DEPARTMENT_OTHER): Payer: BC Managed Care – PPO | Admitting: Internal Medicine

## 2022-09-17 ENCOUNTER — Inpatient Hospital Stay: Payer: BC Managed Care – PPO

## 2022-09-17 VITALS — BP 158/100 | HR 88 | Temp 98.6°F | Wt 247.0 lb

## 2022-09-17 DIAGNOSIS — C7802 Secondary malignant neoplasm of left lung: Secondary | ICD-10-CM

## 2022-09-17 DIAGNOSIS — Z5111 Encounter for antineoplastic chemotherapy: Secondary | ICD-10-CM

## 2022-09-17 DIAGNOSIS — R978 Other abnormal tumor markers: Secondary | ICD-10-CM | POA: Diagnosis not present

## 2022-09-17 DIAGNOSIS — R051 Acute cough: Secondary | ICD-10-CM

## 2022-09-17 DIAGNOSIS — C7801 Secondary malignant neoplasm of right lung: Secondary | ICD-10-CM

## 2022-09-17 DIAGNOSIS — K769 Liver disease, unspecified: Secondary | ICD-10-CM | POA: Diagnosis not present

## 2022-09-17 DIAGNOSIS — R059 Cough, unspecified: Secondary | ICD-10-CM | POA: Insufficient documentation

## 2022-09-17 DIAGNOSIS — C221 Intrahepatic bile duct carcinoma: Secondary | ICD-10-CM | POA: Diagnosis not present

## 2022-09-17 DIAGNOSIS — R197 Diarrhea, unspecified: Secondary | ICD-10-CM

## 2022-09-17 DIAGNOSIS — R918 Other nonspecific abnormal finding of lung field: Secondary | ICD-10-CM | POA: Diagnosis not present

## 2022-09-17 DIAGNOSIS — Z79899 Other long term (current) drug therapy: Secondary | ICD-10-CM | POA: Diagnosis not present

## 2022-09-17 DIAGNOSIS — D869 Sarcoidosis, unspecified: Secondary | ICD-10-CM | POA: Diagnosis not present

## 2022-09-17 DIAGNOSIS — Z5112 Encounter for antineoplastic immunotherapy: Secondary | ICD-10-CM

## 2022-09-17 LAB — CBC WITH DIFFERENTIAL (CANCER CENTER ONLY)
Abs Immature Granulocytes: 0.01 10*3/uL (ref 0.00–0.07)
Basophils Absolute: 0 10*3/uL (ref 0.0–0.1)
Basophils Relative: 1 %
Eosinophils Absolute: 0.1 10*3/uL (ref 0.0–0.5)
Eosinophils Relative: 2 %
HCT: 41.2 % (ref 36.0–46.0)
Hemoglobin: 13.8 g/dL (ref 12.0–15.0)
Immature Granulocytes: 0 %
Lymphocytes Relative: 15 %
Lymphs Abs: 0.7 10*3/uL (ref 0.7–4.0)
MCH: 33.2 pg (ref 26.0–34.0)
MCHC: 33.5 g/dL (ref 30.0–36.0)
MCV: 99 fL (ref 80.0–100.0)
Monocytes Absolute: 0.4 10*3/uL (ref 0.1–1.0)
Monocytes Relative: 9 %
Neutro Abs: 3.5 10*3/uL (ref 1.7–7.7)
Neutrophils Relative %: 73 %
Platelet Count: 240 10*3/uL (ref 150–400)
RBC: 4.16 MIL/uL (ref 3.87–5.11)
RDW: 12.6 % (ref 11.5–15.5)
WBC Count: 4.8 10*3/uL (ref 4.0–10.5)
nRBC: 0 % (ref 0.0–0.2)

## 2022-09-17 LAB — CMP (CANCER CENTER ONLY)
ALT: 22 U/L (ref 0–44)
AST: 31 U/L (ref 15–41)
Albumin: 3.8 g/dL (ref 3.5–5.0)
Alkaline Phosphatase: 115 U/L (ref 38–126)
Anion gap: 11 (ref 5–15)
BUN: 8 mg/dL (ref 6–20)
CO2: 23 mmol/L (ref 22–32)
Calcium: 8.8 mg/dL — ABNORMAL LOW (ref 8.9–10.3)
Chloride: 104 mmol/L (ref 98–111)
Creatinine: 0.56 mg/dL (ref 0.44–1.00)
GFR, Estimated: >60 mL/min (ref >60)
Glucose, Bld: 93 mg/dL (ref 70–99)
Potassium: 3.8 mmol/L (ref 3.5–5.1)
Sodium: 138 mmol/L (ref 135–145)
Total Bilirubin: 0.6 mg/dL (ref 0.3–1.2)
Total Protein: 7.3 g/dL (ref 6.5–8.1)

## 2022-09-17 LAB — MAGNESIUM: Magnesium: 1.9 mg/dL (ref 1.7–2.4)

## 2022-09-17 LAB — TSH: TSH: 2.058 u[IU]/mL (ref 0.350–4.500)

## 2022-09-17 MED ORDER — DEXAMETHASONE 4 MG PO TABS
ORAL_TABLET | ORAL | 1 refills | Status: DC
Start: 2022-09-17 — End: 2022-12-31

## 2022-09-17 MED ORDER — PROCHLORPERAZINE MALEATE 10 MG PO TABS
10.0000 mg | ORAL_TABLET | Freq: Four times a day (QID) | ORAL | 1 refills | Status: DC | PRN
Start: 2022-09-17 — End: 2023-04-27

## 2022-09-17 MED ORDER — LIDOCAINE-PRILOCAINE 2.5-2.5 % EX CREA
TOPICAL_CREAM | CUTANEOUS | 3 refills | Status: DC
Start: 2022-09-17 — End: 2022-11-05

## 2022-09-17 MED ORDER — ONDANSETRON HCL 8 MG PO TABS: 8.0000 mg | ORAL_TABLET | Freq: Three times a day (TID) | ORAL | 1 refills | Status: DC | PRN

## 2022-09-17 MED ORDER — AMLODIPINE BESYLATE 5 MG PO TABS: 5.0000 mg | ORAL_TABLET | Freq: Every day | ORAL | 0 refills | Status: DC

## 2022-09-17 MED ORDER — LIDOCAINE-PRILOCAINE 2.5-2.5 % EX CREA: 1.0000 | TOPICAL_CREAM | CUTANEOUS | 0 refills | Status: DC | PRN

## 2022-09-17 MED FILL — Fosaprepitant Dimeglumine For IV Infusion 150 MG (Base Eq): INTRAVENOUS | Qty: 5 | Status: AC

## 2022-09-17 MED FILL — Dexamethasone Sodium Phosphate Inj 100 MG/10ML: INTRAMUSCULAR | Qty: 1 | Status: AC

## 2022-09-17 NOTE — Progress Notes (Signed)
Patient is still having bad cough, knee pain, nausea, diarrhea. Fingers in both hands are tingling. Patient never received her EMLA cream, Zofran, or the compazine. I switched her to [facility] in [place] like she wanted me to. She also wants to know what could be making her blood pressure so high when all the tests done about her heart have come back normal.

## 2022-09-17 NOTE — Progress Notes (Signed)
[facility] CONSULT NOTE  Patient Care Team: Reubin Milan, MD as PCP - General (Internal Medicine) Glory Buff, RN as Oncology Nurse Navigator Michaelyn Barter, MD as Consulting Physician (Oncology)   CANCER STAGING   Cancer Staging  Cholangiocarcinoma [facility]) Staging form: Intrahepatic Bile Duct, AJCC 8th Edition - Clinical stage from 08/26/2022: Stage IV (cM1) - Signed by Michaelyn Barter, MD on 09/03/2022 Stage prefix: Initial diagnosis   ASSESSMENT & PLAN:  Vicki Phillips 57 y.o. 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  # Cholangiocarcinoma, stage IV - Patient reports she presented to ED with complaints of chest pain.  Had chest x-ray which showed pulmonary nodules was referred to pulmonary.  Records not available.  Was then seen by Dr. Aundria Rud on 08/05/2022. CT chest showed innumerable pulmonary nodules of varying sizes throughout the lung.  Largest spiculated appearing nodule of anterior right upper lobe measuring 2 x 2 cm, at least 1 ill-defined hypodense lesion of the anterior left lobe of the liver.   -PET/CT from 7-24 showed large tumor burden with numerous pulmonary nodules bilaterally.  Index lesion measuring 2 cm in the left upper lobe SUV 8.3, subpleural lateral left lower lobe measuring 1.5 cm SUV 10.2, posterior right base 2 cm SUV 5.5, left supraclavicular lymph node 8 mm SUV 4.5, numerous liver masses, dominant lesion 8 cm in segment 4B SUV 12.7 with additional lesions in segment 5 6 and 4a, multiple abdominal lymph nodes are identified.  Pancreas looks normal.  Tracer avid sclerotic lesion within T3 SUV 4.5.  - s/p liver biopsy on 08/26/2022 showed metastatic moderately differentiated adenocarcinoma.  IHC stain positive for CK7 and CK20.  TTF-1, PAX8, GATA3 and CDX2 are negative.  Differentials include cholangiocarcinoma and pancreatic carcinoma.  Pancreatic cancer is less  likely since it looks normal on the imaging. -I will obtain tumor markers CA 19-9, CEA and AFP levels to support the biopsy results.  -MRI brain with and without contrast done for headaches was negative for metastasis.  Plan- -patient sought second opinion with Dr. Hulen Luster at [facility] who agreed with the treatment plan.  Plan for follow-up in 3 months. -Labs reviewed and acceptable for treatment.  Will proceed with cycle 1 day 1 of cisplatin, gemcitabine and durvalumab tomorrow.  Prescription sent for Decadron to be taken for 3 days after chemo.  Zofran and Compazine as needed.  Emla cream. -Foundation 1 is pending. -Pretreatment CA 19-9 352.  Will trend.  # Elevated blood pressure -Advised patient to buy BP monitor at home and to keep blood pressure log. -If blood pressure continues to be 150 or above, she can start amlodipine 5 mg daily.  Prescription was sent.  # Cough -Advised over-the-counter Robitussin DM  # Diarrhea -GI panel and C. difficile was negative -Consider adding Imodium every 4 hours as needed. -Creon not helping  # Weight loss # Poor appetite -Discussed about nutrition consult and appetite stimulant.  Would like to hold off  # History of sarcoidosis -Management per pulmonary -Never required treatment.   No orders of the defined types were placed in this encounter.  RTC in 1 week for MD visit, labs, cycle 1 day 2 of gem and cis  The total time spent in the appointment was 45 minutes encounter with patients including review of chart and various tests results, discussions about plan of care and coordination of care plan   All questions were answered.  The patient knows to call the clinic with any problems, questions or concerns. No barriers to learning was detected.  Michaelyn Barter, MD 7/25/20242:49 PM   HISTORY OF PRESENTING ILLNESS:  Vicki Phillips 57 y.o. female with pmh of sarcoidosis not on any treatment was referred to oncology for multiple pulmonary  nodules and liver lesion.  Patient reports she presented to ED with complaints of chest pain.  Had chest x-ray which showed pulmonary nodules was referred to pulmonary.  Records not available.  Was then seen by Dr. Aundria Rud on 08/05/2022.  CT chest showed innumerable pulmonary nodules of varying sizes throughout the lung.  Largest spiculated appearing nodule of anterior right upper lobe measuring 2 x 2 cm, at least 1 ill-defined hypodense lesion of the anterior left lobe of the liver.  Despite stated history of sarcoidosis, there are no specific or characteristic features of sarcoidosis with respect to distribution, morphology, associated fibrotic change, nor associated mediastinal lymphadenopathy. 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  She has history of sarcoidosis but was never on treatment.  Reports that she does not see doctors on a regular basis because she is had bad experiences.  Interval history Patient seen today prior to cycle 1 day 1 of gem sister via treatment and labs. Had second opinion with [facility] which went well.  Agreed with the treatment plan.  Is having diarrhea after every meal.  Creon is not helping.  We discussed about adding Imodium.  Having dry cough.  We discussed about adding Robitussin.  I have reviewed her chart and materials related to her cancer extensively and collaborated history with the patient. Summary of oncologic history is as follows: Oncology History  Cholangiocarcinoma (HCC)  08/26/2022 Cancer Staging   Staging form: Intrahepatic Bile Duct, AJCC 8th Edition - Clinical stage from 08/26/2022: Stage IV (cM1) - Signed by Michaelyn Barter, MD on 09/03/2022 Stage prefix: Initial diagnosis   09/03/2022 Initial Diagnosis   Cholangiocarcinoma (HCC)   09/17/2022 -  Chemotherapy   Patient is on Treatment Plan :  BILIARY TRACT Cisplatin + Gemcitabine D1,8 + Durvalumab (1500) D1 q21d / Durvalumab (1500) q28d     Metastasis to lung (HCC)  09/03/2022 Initial Diagnosis   Metastasis to lung (HCC)   09/17/2022 -  Chemotherapy   Patient is on Treatment Plan : BILIARY TRACT Cisplatin + Gemcitabine D1,8 + Durvalumab (1500) D1 q21d / Durvalumab (1500) q28d       MEDICAL HISTORY:  Past Medical History:  Diagnosis Date   Allergy    Sarcoidosis 2011    SURGICAL HISTORY: Past Surgical History:  Procedure Laterality Date   CESAREAN SECTION     x 2   COLONOSCOPY WITH PROPOFOL N/A 08/10/2019   Procedure: COLONOSCOPY WITH PROPOFOL;  Surgeon: Midge Minium, MD;  Location: [facility];  Service: Endoscopy;  Laterality: N/A;  priority 4   IR IMAGING GUIDED PORT INSERTION  09/11/2022   LUNG BIOPSY      SOCIAL HISTORY: Social History   Socioeconomic History   Marital status: Single    Spouse name: Not on file   Number of children: 2   Years of education: Not on file   Highest education level: Not on file  Occupational History   Not on file  Tobacco Use   Smoking status: Never   Smokeless tobacco: Never  Vaping Use   Vaping status: Never Used  Substance and Sexual Activity   Alcohol use: Yes    Alcohol/week: 10.0 standard drinks of alcohol    Types: 10 Standard drinks or equivalent per week   Drug use: No   Sexual activity: Yes    Partners: Male    Birth control/protection: Surgical  Other Topics Concern   Not on file  Social History Narrative   Not on file   Social Determinants of Health   Financial Resource Strain: Not on file  Food Insecurity: No Food Insecurity (08/24/2022)   Hunger Vital Sign    Worried About Running Out of Food in the Last Year: Never true    Ran Out of Food in the Last Year: Never true  Transportation Needs: No Transportation Needs (08/24/2022)   PRAPARE - Administrator, Civil Service (Medical): No    Lack of Transportation (Non-Medical): No   Physical Activity: Not on file  Stress: Not on file  Social Connections: Not on file  Intimate Partner Violence: Not At Risk (08/24/2022)   Humiliation, Afraid, Rape, and Kick questionnaire    Fear of Current or Ex-Partner: No    Emotionally Abused: No    Physically Abused: No    Sexually Abused: No    FAMILY HISTORY: Family History  Problem Relation Age of Onset   Stroke Mother    Heart disease Mother    Heart attack Father 65   Heart disease Father    Kidney disease Sister     ALLERGIES:  is allergic to other.  MEDICATIONS:  Current Outpatient Medications  Medication Sig Dispense Refill   amLODipine (NORVASC) 5 MG tablet Take 1 tablet (5 mg total) by mouth daily. 30 tablet 0   lidocaine-prilocaine (EMLA) cream Apply 1 Application topically as needed. 30 g 0   lipase/protease/amylase (CREON) 36000 UNITS CPEP capsule Take 1 capsule (36,000 Units total) by mouth 3 (three) times daily with meals. May also take 1 capsule (36,000 Units total) as needed (with snacks). 240 capsule 1   loratadine (CLARITIN) 10 MG tablet Take 10 mg by mouth daily.     pseudoephedrine-acetaminophen (TYLENOL SINUS) 30-500 MG TABS tablet Take 1 tablet by mouth in the morning and at bedtime.     dexamethasone (DECADRON) 4 MG tablet Take 2 tablets (8 mg) by mouth daily x 3 days starting the day after cisplatin chemotherapy. Take with food. 30 tablet 1   lidocaine-prilocaine (EMLA) cream Apply to affected area once 30 g 3   ondansetron (ZOFRAN) 8 MG tablet Take 1 tablet (8 mg total) by mouth every 8 (eight) hours as needed for nausea or vomiting. Start on the third day after cisplatin. 30 tablet 1   prochlorperazine (COMPAZINE) 10 MG tablet Take 1 tablet (10 mg total) by mouth every 6 (six) hours as needed (Nausea or vomiting). 30 tablet 1   No current facility-administered medications for this visit.    REVIEW OF SYSTEMS:   Pertinent information mentioned in HPI All other systems were reviewed with the  patient and are negative.  PHYSICAL EXAMINATION: ECOG PERFORMANCE STATUS: 1 - Symptomatic but completely ambulatory  Vitals:   09/17/22 1010  BP: (!) 158/100  Pulse: 88  Temp: 98.6 F (37 C)  SpO2: 97%    Filed Weights   09/17/22 1010  Weight: 247 lb (112 kg)     GENERAL:alert, no distress and comfortable SKIN: skin color, texture, turgor are normal, no rashes or significant lesions EYES: normal, conjunctiva are pink and non-injected, sclera clear OROPHARYNX:no exudate,  no erythema and lips, buccal mucosa, and tongue normal  NECK: supple, thyroid normal size, non-tender, without nodularity LYMPH:  no palpable lymphadenopathy in the cervical, axillary or inguinal LUNGS: clear to auscultation and percussion with normal breathing effort HEART: regular rate & rhythm and no murmurs and no lower extremity edema ABDOMEN:abdomen soft, non-tender and normal bowel sounds Musculoskeletal:no cyanosis of digits and no clubbing  PSYCH: alert & oriented x 3 with fluent speech NEURO: no focal motor/sensory deficits  LABORATORY DATA:  I have reviewed the data as listed Lab Results  Component Value Date   WBC 4.8 09/17/2022   HGB 13.8 09/17/2022   HCT 41.2 09/17/2022   MCV 99.0 09/17/2022   PLT 240 09/17/2022   Recent Labs    08/06/22 1010 09/03/22 1103 09/17/22 0925  NA 140 138 138  K 4.3 3.8 3.8  CL 104 102 104  CO2 25 24 23   GLUCOSE 100* 104* 93  BUN 13 12 8   CREATININE 0.72 0.62 0.56  CALCIUM 9.4 9.3 8.8*  GFRNONAA >60 >60 >60  PROT 7.6 8.1 7.3  ALBUMIN 4.1 4.5 3.8  AST 38 41 31  ALT 28 28 22   ALKPHOS 125 131* 115  BILITOT 0.7 0.7 0.6    RADIOGRAPHIC STUDIES: I have personally reviewed the radiological images as listed and agreed with the findings in the report. IR IMAGING GUIDED PORT INSERTION  Result Date: 09/11/2022 INDICATION: Chemotherapy EXAM: Chest port placement using ultrasound and fluoroscopic guidance MEDICATIONS: Documented in the EMR  ANESTHESIA/SEDATION: Moderate (conscious) sedation was employed during this procedure. A total of Versed 2 mg and Fentanyl 100 mcg was administered intravenously. Moderate Sedation Time: 30 minutes. The patient's level of consciousness and vital signs were monitored continuously by radiology nursing throughout the procedure under my direct supervision. FLUOROSCOPY TIME:  Fluoroscopy Time: 0.4 minutes (1 mGy) COMPLICATIONS: None immediate. PROCEDURE: Informed written consent was obtained from the patient after a thorough discussion of the procedural risks, benefits and alternatives. All questions were addressed. Maximal Sterile Barrier Technique was utilized including caps, mask, sterile gowns, sterile gloves, sterile drape, hand hygiene and skin antiseptic. A timeout was performed prior to the initiation of the procedure. The patient was placed supine on the exam table. The right neck and chest was prepped and draped in the standard sterile fashion. A preliminary ultrasound of the right neck was performed and demonstrates a patent right internal jugular vein. A permanent ultrasound image was stored in the electronic medical record. The overlying skin was anesthetized with 1% Lidocaine. Using ultrasound guidance, access was obtained into the right internal jugular vein using a 21 gauge micropuncture set. A wire was advanced into the SVC, a short incision was made at the puncture site, and serial dilatation performed. Next, in an ipsilateral infraclavicular location, an incision was made at the site of the subcutaneous reservoir. Blunt dissection was used to open a pocket to contain the reservoir. A subcutaneous tunnel was then created from the port site to the puncture site. A(n) 8 Fr single lumen catheter was advanced through the tunnel. The catheter was attached to the port and this was placed in the subcutaneous pocket. Under fluoroscopic guidance, a peel away sheath was placed, and the catheter was trimmed to the  appropriate length and was advanced into the central veins. The catheter length is 24 cm. The tip of the catheter lies near the superior cavoatrial junction. The port flushes and aspirates appropriately. The port was flushed and locked with heparinized saline. The port  pocket was closed in 2 layers using 3-0 and 4-0 Vicryl/absorbable suture. Dermabond was also applied to both incisions. The patient tolerated the procedure well and was transferred to recovery in stable condition. IMPRESSION: Successful placement of a right-sided chest port via the right internal jugular vein. The port is ready for immediate use. Electronically Signed   By: Olive Bass M.D.   On: 09/11/2022 15:52   CT ABDOMEN PELVIS W CONTRAST  Result Date: 08/31/2022 CLINICAL DATA:  57 year old female with history of multiple pulmonary nodules. Evaluate for source of malignancy. * Tracking Code: BO * EXAM: CT ABDOMEN AND PELVIS WITH CONTRAST TECHNIQUE: Multidetector CT imaging of the abdomen and pelvis was performed using the standard protocol following bolus administration of intravenous contrast. RADIATION DOSE REDUCTION: This exam was performed according to the departmental dose-optimization program which includes automated exposure control, adjustment of the mA and/or kV according to patient size and/or use of iterative reconstruction technique. CONTRAST:  OMNIPAQUE IOHEXOL 350 MG/ML SOLN COMPARISON:  PET-CT 08/25/2022. FINDINGS: Lower chest: Innumerable pulmonary nodules are again noted, better demonstrated on prior chest CT, likely reflective of widespread metastatic disease to the lungs. Hepatobiliary: Multiple hypovascular hepatic lesions are noted, largest of which is centered in segments 4A and 4B (axial image 24 of series 2 and coronal image 31 of series 5) measuring approximately 8.0 x 7.1 x 7.2 cm. Several other smaller lesions are noted, largest of which is in the inferior aspect of segment 5 (axial image 35 of series 2)  measuring 1.5 x 1.2 cm. No intra or extrahepatic biliary ductal dilatation. Gallbladder is only mildly distended, but filled with high attenuation material. No pericholecystic fluid or surrounding inflammatory changes. Pancreas: No pancreatic mass. No pancreatic ductal dilatation. No pancreatic or peripancreatic fluid collections or inflammatory changes. Spleen: Unremarkable. Adrenals/Urinary Tract: Bilateral kidneys and adrenal glands are normal in appearance. No hydroureteronephrosis. Urinary bladder is normal in appearance. Stomach/Bowel: The appearance of the stomach is normal. No pathologic dilatation of small bowel or colon. Numerous colonic diverticuli are noted, without surrounding inflammatory changes to suggest an acute diverticulitis at this time. Normal appendix. Vascular/Lymphatic: No atherosclerotic calcifications, aneurysm or dissection noted in the abdominal or pelvic vasculature. Numerous prominent borderline enlarged and mildly enlarged lymph nodes are noted in the upper abdomen and retroperitoneum, including a 1.3 cm short axis portacaval lymph node (axial image 26 of series 2) and numerous para-aortic lymph nodes measuring up to 1.2 cm in short axis (axial image 33 of series 2) adjacent to the left renal hilum. No definite pelvic lymphadenopathy. Reproductive: Uterus and ovaries are unremarkable in appearance. Other: No significant volume of ascites.  No pneumoperitoneum. Musculoskeletal: There are no aggressive appearing lytic or blastic lesions noted in the visualized portions of the skeleton. IMPRESSION: 1. Multiple liver lesions, most notable for a dominant lesion centered in segments 4A and 4B of the liver estimated to measure approximately 8.0 x 7.1 x 7.2 cm. These lesions are hypovascular and nonspecific in appearance. Whether this is a primary hepatic lesion with multifocal hepatic metastases or are all metastatic liver lesions is uncertain on today's examination. Tissue sampling should  be considered if clinically appropriate. 2. Metastatic lymphadenopathy noted in the upper abdomen, similar to prior PET-CT. 3. Numerous pulmonary nodules indicative of metastatic disease to the lungs. 4. Colonic diverticulosis without evidence of acute diverticulitis at this time. Electronically Signed   By: Trudie Reed M.D.   On: 08/31/2022 08:39   NM PET Image Initial (PI) Skull Base To  Thigh (F-18 FDG)  Result Date: 08/30/2022 CLINICAL DATA:  Initial treatment strategy for pulmonary nodules. EXAM: NUCLEAR MEDICINE PET SKULL BASE TO THIGH TECHNIQUE: 12.69 mCi F-18 FDG was injected intravenously. Full-ring PET imaging was performed from the skull base to thigh after the radiotracer. CT data was obtained and used for attenuation correction and anatomic localization. Fasting blood glucose: 101 mg/dl COMPARISON:  CT chest 65/78/4696 FINDINGS: Mediastinal blood pool activity: SUV max 2.60 Liver activity: SUV max NA NECK: No hypermetabolic lymph nodes in the neck. Incidental CT findings: None. CHEST: Too numerous to count tracer avid pulmonary nodules are scattered throughout both lungs. -index lesion within the left upper lobe measures 2 cm with SUV max of 8.39, image 50/4. -Index lesion within the subpleural, lateral left upper lobe measures 1.5 cm with SUV max of 10.25, image 49/4. -index lesion in the posterior right base measures 2 cm with SUV max of 5.52, image 73/4. No tracer avid mediastinal or hilar lymph nodes. -Left supraclavicular lymph node measures 8 mm with SUV max 4.53, image 32/4. Incidental CT findings: Mild cardiac enlargement. ABDOMEN/PELVIS: Segment 4B liver masses tracer avid measuring 7.6 x 8.0 cm with SUV max of 12.73, image 88/4. There are additional, smaller tracer avid lesions noted in segment 5, 6 and segment 4a. No abnormal tracer uptake identified within the pancreas, spleen, or adrenal glands. Multiple tracer avid abdominal lymph nodes are identified. Index pre caval lymph node  measures 6 mm with SUV max 7.32, image 92/4. Index left retroperitoneal lymph node measures 1 cm with SUV max of 5.81, image 99/4. No tracer avid pelvic or inguinal lymph nodes. Incidental CT findings: High attenuation material identified within the gallbladder which may reflect tumefactive sludge. SKELETON: Tracer avid sclerotic lesions identified within the T3 vertebra with SUV max 4.56, image 42/4. No additional tracer avid bone lesions identified. Incidental CT findings: None. IMPRESSION: 1. Too numerous to count tracer avid pulmonary nodules are identified throughout both lungs compatible with diffuse metastasis. 2. Tracer avid left supraclavicular and abdominal lymph nodes are identified compatible with nodal metastasis. 3. Multiple tracer avid liver lesions are identified. Dominant tracer avid liver metastasis is in segment 4B measuring 8 cm with SUV max of 12.73. 4. Tracer avid sclerotic lesion identified within the T3 vertebra. Electronically Signed   By: Signa Kell M.D.   On: 08/30/2022 11:23   US BIOPSY (LIVER)  Result Date: 08/26/2022 INDICATION: Liver mass EXAM: Ultrasound-guided core needle biopsy of liver mass MEDICATIONS: None. ANESTHESIA/SEDATION: Moderate (conscious) sedation was employed during this procedure. A total of Versed 2 mg and Fentanyl 50 mcg was administered intravenously by the radiology nurse. Total intra-service moderate Sedation Time: 10 minutes. The patient's level of consciousness and vital signs were monitored continuously by radiology nursing throughout the procedure under my direct supervision. COMPLICATIONS: None immediate. PROCEDURE: Informed written consent was obtained from the patient after a thorough discussion of the procedural risks, benefits and alternatives. All questions were addressed. Maximal Sterile Barrier Technique was utilized including caps, mask, sterile gowns, sterile gloves, sterile drape, hand hygiene and skin antiseptic. A timeout was performed  prior to the initiation of the procedure. The patient was placed supine on the exam table. Ultrasound of the liver demonstrated large hypoechoic lesion centrally located in the liver. Skin entry site was marked, and the overlying skin was prepped draped in the standard sterile fashion. Local analgesia was obtained with 1% lidocaine. Using ultrasound guidance, a 17 gauge introducer needle was advanced towards the identified lesion in  the central portion of the liver. Subsequently, core needle biopsy was performed of the central liver lesion using an 18 gauge core biopsy device x4 total passes. Specimens were submitted in formalin to pathology for further handling. Limited postprocedure imaging demonstrated no hematoma. A clean dressing was placed after manual hemostasis. The patient tolerated the procedure well without immediate complication. IMPRESSION: Successful ultrasound-guided core needle biopsy of large lesion centrally located within the liver. Electronically Signed   By: Olive Bass M.D.   On: 08/26/2022 14:20   MR Brain W Wo Contrast  Result Date: 08/25/2022 CLINICAL DATA:  Metastatic disease evaluation, headaches and some memory loss EXAM: MRI HEAD WITHOUT AND WITH CONTRAST TECHNIQUE: Multiplanar, multiecho pulse sequences of the brain and surrounding structures were obtained without and with intravenous contrast. CONTRAST:  10mL GADAVIST GADOBUTROL 1 MMOL/ML IV SOLN COMPARISON:  No prior MRI available, correlation is made with 06/18/2006 CT head FINDINGS: Brain: No restricted diffusion to suggest acute or subacute infarct. No abnormal parenchymal or meningeal enhancement. No acute hemorrhage, mass, mass effect, or midline shift. No hydrocephalus or extra-axial collection. Normal pituitary and craniocervical junction. No hemosiderin deposition to suggest remote hemorrhage. Normal cerebral volume for age. Scattered T2 hyperintense signal in the periventricular white matter, likely the sequela of mild  chronic small vessel ischemic disease. Vascular: Normal arterial flow voids. Normal arterial and venous enhancement. Skull and upper cervical spine: Normal marrow signal. Sinuses/Orbits: Clear paranasal sinuses. No acute finding in the orbits. Other: The mastoid air cells are well aerated. IMPRESSION: No acute intracranial process. No evidence of metastatic disease in the brain. Electronically Signed   By: Wiliam Ke M.D.   On: 08/25/2022 15:39

## 2022-09-18 ENCOUNTER — Inpatient Hospital Stay: Payer: BC Managed Care – PPO

## 2022-09-18 ENCOUNTER — Encounter: Payer: Self-pay | Admitting: *Deleted

## 2022-09-18 VITALS — BP 156/98 | HR 82 | Temp 97.1°F | Resp 18

## 2022-09-18 DIAGNOSIS — C7801 Secondary malignant neoplasm of right lung: Secondary | ICD-10-CM

## 2022-09-18 DIAGNOSIS — C221 Intrahepatic bile duct carcinoma: Secondary | ICD-10-CM

## 2022-09-18 DIAGNOSIS — Z79899 Other long term (current) drug therapy: Secondary | ICD-10-CM | POA: Diagnosis not present

## 2022-09-18 DIAGNOSIS — K769 Liver disease, unspecified: Secondary | ICD-10-CM | POA: Diagnosis not present

## 2022-09-18 DIAGNOSIS — D869 Sarcoidosis, unspecified: Secondary | ICD-10-CM | POA: Diagnosis not present

## 2022-09-18 DIAGNOSIS — Z5111 Encounter for antineoplastic chemotherapy: Secondary | ICD-10-CM | POA: Diagnosis not present

## 2022-09-18 DIAGNOSIS — R918 Other nonspecific abnormal finding of lung field: Secondary | ICD-10-CM | POA: Diagnosis not present

## 2022-09-18 DIAGNOSIS — R978 Other abnormal tumor markers: Secondary | ICD-10-CM | POA: Diagnosis not present

## 2022-09-18 MED ORDER — SODIUM CHLORIDE 0.9 % IV SOLN
150.0000 mg | Freq: Once | INTRAVENOUS | Status: AC
  Administered 2022-09-18: 150 mg via INTRAVENOUS
  Filled 2022-09-18: qty 150

## 2022-09-18 MED ORDER — PALONOSETRON HCL INJECTION 0.25 MG/5ML
0.2500 mg | Freq: Once | INTRAVENOUS | Status: AC
  Administered 2022-09-18: 0.25 mg via INTRAVENOUS
  Filled 2022-09-18: qty 5

## 2022-09-18 MED ORDER — SODIUM CHLORIDE 0.9 % IV SOLN
Freq: Once | INTRAVENOUS | Status: AC
  Filled 2022-09-18: qty 250

## 2022-09-18 MED ORDER — HEPARIN SOD (PORK) LOCK FLUSH 100 UNIT/ML IV SOLN
500.0000 [IU] | Freq: Once | INTRAVENOUS | Status: AC | PRN
  Administered 2022-09-18: 500 [IU]
  Filled 2022-09-18: qty 5

## 2022-09-18 MED ORDER — SODIUM CHLORIDE 0.9 % IV SOLN
10.0000 mg | Freq: Once | INTRAVENOUS | Status: AC
  Administered 2022-09-18: 10 mg via INTRAVENOUS
  Filled 2022-09-18: qty 10

## 2022-09-18 MED ORDER — POTASSIUM CHLORIDE IN NACL 20-0.9 MEQ/L-% IV SOLN
Freq: Once | INTRAVENOUS | Status: AC
  Filled 2022-09-18: qty 1000

## 2022-09-18 MED ORDER — SODIUM CHLORIDE 0.9 % IV SOLN
1000.0000 mg/m2 | Freq: Once | INTRAVENOUS | Status: AC
  Administered 2022-09-18: 2318 mg via INTRAVENOUS
  Filled 2022-09-18: qty 60.96

## 2022-09-18 MED ORDER — MAGNESIUM SULFATE 2 GM/50ML IV SOLN
2.0000 g | Freq: Once | INTRAVENOUS | Status: AC
  Administered 2022-09-18: 2 g via INTRAVENOUS
  Filled 2022-09-18: qty 50

## 2022-09-18 MED ORDER — SODIUM CHLORIDE 0.9 % IV SOLN
1500.0000 mg | Freq: Once | INTRAVENOUS | Status: AC
  Administered 2022-09-18: 1500 mg via INTRAVENOUS
  Filled 2022-09-18: qty 30

## 2022-09-18 MED ORDER — SODIUM CHLORIDE 0.9 % IV SOLN
25.0000 mg/m2 | Freq: Once | INTRAVENOUS | Status: AC
  Administered 2022-09-18: 58 mg via INTRAVENOUS
  Filled 2022-09-18: qty 58

## 2022-09-18 NOTE — Progress Notes (Signed)
[facility] CSW Progress Note  Visual merchandiser met with patient while she was in infusion to assess needs.  Patient displayed a bright affect and appeared to respond positively while engaging in life review.  She has several friends and family members that are supportive.  She expressed appropriate insite regarding her cancer.  CSW provided active listening and supportive counseling.    Dorothey Baseman, LCSW Clinical Social Worker [facility]

## 2022-09-18 NOTE — Progress Notes (Signed)
Met with patient and her son during infusion today. All questions answered during visit. Pt did not have any needs at this time. Instructed to call with any questions or needs. Reminded to call on-call provider this weekend if has any concerns or unmanageable side effects. Pt verbalized understanding. Nothing further needed at this time.

## 2022-09-18 NOTE — Patient Instructions (Signed)
[facility]  Discharge Instructions: Thank you for choosing [facility] to provide your oncology and hematology care.  If you have a lab appointment with the Cancer Center, please go directly to the Cancer Center and check in at the registration area.  Wear comfortable clothing and clothing appropriate for easy access to any Portacath or PICC line.   We strive to give you quality time with your provider. You may need to reschedule your appointment if you arrive late (15 or more minutes).  Arriving late affects you and other patients whose appointments are after yours.  Also, if you miss three or more appointments without notifying the office, you may be dismissed from the clinic at the provider's discretion.      For prescription refill requests, have your pharmacy contact our office and allow 72 hours for refills to be completed.    Today you received the following chemotherapy and/or immunotherapy agents Imfinzi, Gemzar and Cisplatin       To help prevent nausea and vomiting after your treatment, we encourage you to take your nausea medication as directed.  BELOW ARE SYMPTOMS THAT SHOULD BE REPORTED IMMEDIATELY: *FEVER GREATER THAN 100.4 F (38 C) OR HIGHER *CHILLS OR SWEATING *NAUSEA AND VOMITING THAT IS NOT CONTROLLED WITH YOUR NAUSEA MEDICATION *UNUSUAL SHORTNESS OF BREATH *UNUSUAL BRUISING OR BLEEDING *URINARY PROBLEMS (pain or burning when urinating, or frequent urination) *BOWEL PROBLEMS (unusual diarrhea, constipation, pain near the anus) TENDERNESS IN MOUTH AND THROAT WITH OR WITHOUT PRESENCE OF ULCERS (sore throat, sores in mouth, or a toothache) UNUSUAL RASH, SWELLING OR PAIN  UNUSUAL VAGINAL DISCHARGE OR ITCHING   Items with * indicate a potential emergency and should be followed up as soon as possible or go to the Emergency Department if any problems should occur.  Please show the CHEMOTHERAPY ALERT CARD or IMMUNOTHERAPY ALERT  CARD at check-in to the Emergency Department and triage nurse.  Should you have questions after your visit or need to cancel or reschedule your appointment, please contact [facility]  [phone] and follow the prompts.  Office hours are 8:00 a.m. to 4:30 p.m. Monday - Friday. Please note that voicemails left after 4:00 p.m. may not be returned until the following business day.  We are closed weekends and major holidays. You have access to a nurse at all times for urgent questions. Please call the main number to the clinic [phone] and follow the prompts.  For any non-urgent questions, you may also contact your provider using MyChart. We now offer e-Visits for anyone 43 and older to request care online for non-urgent symptoms. For details visit [email or web address].   Also download the MyChart app! Go to the app store, search "MyChart", open the app, select [place], and log in with your MyChart username and password.     Durvalumab Injection What is this medication? DURVALUMAB (dur VAL ue mab) treats some types of cancer. It works by helping your immune system slow or stop the spread of cancer cells. It is a monoclonal antibody. This medicine may be used for other purposes; ask your health care provider or pharmacist if you have questions. COMMON BRAND NAME(S): IMFINZI What should I tell my care team before I take this medication? They need to know if you have any of these conditions: Allogeneic stem cell transplant (uses someone else's stem cells) Autoimmune diseases, such as Crohn disease, ulcerative colitis, lupus History of chest radiation Nervous system problems,  such as Guillain-Barre syndrome, myasthenia gravis Organ transplant An unusual or allergic reaction to durvalumab, other medications, foods, dyes, or preservatives Pregnant or trying to get pregnant Breast-feeding How should I use this medication? This medication is infused into  a vein. It is given by your care team in a hospital or clinic setting. A special MedGuide will be given to you before each treatment. Be sure to read this information carefully each time. Talk to your care team about the use of this medication in children. Special care may be needed. Overdosage: If you think you have taken too much of this medicine contact a poison control center or emergency room at once. NOTE: This medicine is only for you. Do not share this medicine with others. What if I miss a dose? Keep appointments for follow-up doses. It is important not to miss your dose. Call your care team if you are unable to keep an appointment. What may interact with this medication? Interactions have not been studied. This list may not describe all possible interactions. Give your health care provider a list of all the medicines, herbs, non-prescription drugs, or dietary supplements you use. Also tell them if you smoke, drink alcohol, or use illegal drugs. Some items may interact with your medicine. What should I watch for while using this medication? Your condition will be monitored carefully while you are receiving this medication. You may need blood work while taking this medication. This medication may cause serious skin reactions. They can happen weeks to months after starting the medication. Contact your care team right away if you notice fevers or flu-like symptoms with a rash. The rash may be red or purple and then turn into blisters or peeling of the skin. You may also notice a red rash with swelling of the face, lips, or lymph nodes in your neck or under your arms. Tell your care team right away if you have any change in your eyesight. Talk to your care team if you may be pregnant. Serious birth defects can occur if you take this medication during pregnancy and for 3 months after the last dose. You will need a negative pregnancy test before starting this medication. Contraception is recommended  while taking this medication and for 3 months after the last dose. Your care team can help you find the option that works for you. Do not breastfeed while taking this medication and for 3 months after the last dose. What side effects may I notice from receiving this medication? Side effects that you should report to your care team as soon as possible: Allergic reactions--skin rash, itching, hives, swelling of the face, lips, tongue, or throat Dry cough, shortness of breath or trouble breathing Eye pain, redness, irritation, or discharge with blurry or decreased vision Heart muscle inflammation--unusual weakness or fatigue, shortness of breath, chest pain, fast or irregular heartbeat, dizziness, swelling of the ankles, feet, or hands Hormone gland problems--headache, sensitivity to light, unusual weakness or fatigue, dizziness, fast or irregular heartbeat, increased sensitivity to cold or heat, excessive sweating, constipation, hair loss, increased thirst or amount of urine, tremors or shaking, irritability Infusion reactions--chest pain, shortness of breath or trouble breathing, feeling faint or lightheaded Kidney injury (glomerulonephritis)--decrease in the amount of urine, red or dark brown urine, foamy or bubbly urine, swelling of the ankles, hands, or feet Liver injury--right upper belly pain, loss of appetite, nausea, light-colored stool, dark yellow or brown urine, yellowing skin or eyes, unusual weakness or fatigue Pain, tingling, or numbness in  the hands or feet, muscle weakness, change in vision, confusion or trouble speaking, loss of balance or coordination, trouble walking, seizures Rash, fever, and swollen lymph nodes Redness, blistering, peeling, or loosening of the skin, including inside the mouth Sudden or severe stomach pain, bloody diarrhea, fever, nausea, vomiting Side effects that usually do not require medical attention (report these to your care team if they continue or are  bothersome): Bone, joint, or muscle pain Diarrhea Fatigue Loss of appetite Nausea Skin rash This list may not describe all possible side effects. Call your doctor for medical advice about side effects. You may report side effects to FDA at 1-800-FDA-1088. Where should I keep my medication? This medication is given in a hospital or clinic. It will not be stored at home. NOTE: This sheet is a summary. It may not cover all possible information. If you have questions about this medicine, talk to your doctor, pharmacist, or health care provider.  2024 Elsevier/Gold Standard (2021-06-24 00:00:00)   Gemcitabine Injection What is this medication? GEMCITABINE (jem SYE ta been) treats some types of cancer. It works by slowing down the growth of cancer cells. This medicine may be used for other purposes; ask your health care provider or pharmacist if you have questions. COMMON BRAND NAME(S): Gemzar, Infugem What should I tell my care team before I take this medication? They need to know if you have any of these conditions: Blood disorders Infection Kidney disease Liver disease Lung or breathing disease, such as asthma or COPD Recent or ongoing radiation therapy An unusual or allergic reaction to gemcitabine, other medications, foods, dyes, or preservatives If you or your partner are pregnant or trying to get pregnant Breast-feeding How should I use this medication? This medication is injected into a vein. It is given by your care team in a hospital or clinic setting. Talk to your care team about the use of this medication in children. Special care may be needed. Overdosage: If you think you have taken too much of this medicine contact a poison control center or emergency room at once. NOTE: This medicine is only for you. Do not share this medicine with others. What if I miss a dose? Keep appointments for follow-up doses. It is important not to miss your dose. Call your care team if you are  unable to keep an appointment. What may interact with this medication? Interactions have not been studied. This list may not describe all possible interactions. Give your health care provider a list of all the medicines, herbs, non-prescription drugs, or dietary supplements you use. Also tell them if you smoke, drink alcohol, or use illegal drugs. Some items may interact with your medicine. What should I watch for while using this medication? Your condition will be monitored carefully while you are receiving this medication. This medication may make you feel generally unwell. This is not uncommon, as chemotherapy can affect healthy cells as well as cancer cells. Report any side effects. Continue your course of treatment even though you feel ill unless your care team tells you to stop. In some cases, you may be given additional medications to help with side effects. Follow all directions for their use. This medication may increase your risk of getting an infection. Call your care team for advice if you get a fever, chills, sore throat, or other symptoms of a cold or flu. Do not treat yourself. Try to avoid being around people who are sick. This medication may increase your risk to bruise or bleed.  Call your care team if you notice any unusual bleeding. Be careful brushing or flossing your teeth or using a toothpick because you may get an infection or bleed more easily. If you have any dental work done, tell your dentist you are receiving this medication. Avoid taking medications that contain aspirin, acetaminophen, ibuprofen, naproxen, or ketoprofen unless instructed by your care team. These medications may hide a fever. Talk to your care team if you or your partner wish to become pregnant or think you might be pregnant. This medication can cause serious birth defects if taken during pregnancy and for 6 months after the last dose. A negative pregnancy test is required before starting this medication. A  reliable form of contraception is recommended while taking this medication and for 6 months after the last dose. Talk to your care team about effective forms of contraception. Do not father a child while taking this medication and for 3 months after the last dose. Use a condom while having sex during this time period. Do not breastfeed while taking this medication and for at least 1 week after the last dose. This medication may cause infertility. Talk to your care team if you are concerned about your fertility. What side effects may I notice from receiving this medication? Side effects that you should report to your care team as soon as possible: Allergic reactions--skin rash, itching, hives, swelling of the face, lips, tongue, or throat Capillary leak syndrome--stomach or muscle pain, unusual weakness or fatigue, feeling faint or lightheaded, decrease in the amount of urine, swelling of the ankles, hands, or feet, trouble breathing Infection--fever, chills, cough, sore throat, wounds that don't heal, pain or trouble when passing urine, general feeling of discomfort or being unwell Liver injury--right upper belly pain, loss of appetite, nausea, light-colored stool, dark yellow or brown urine, yellowing skin or eyes, unusual weakness or fatigue Low red blood cell level--unusual weakness or fatigue, dizziness, headache, trouble breathing Lung injury--shortness of breath or trouble breathing, cough, spitting up blood, chest pain, fever Stomach pain, bloody diarrhea, pale skin, unusual weakness or fatigue, decrease in the amount of urine, which may be signs of hemolytic uremic syndrome Sudden and severe headache, confusion, change in vision, seizures, which may be signs of posterior reversible encephalopathy syndrome (PRES) Unusual bruising or bleeding Side effects that usually do not require medical attention (report to your care team if they continue or are bothersome): Diarrhea Drowsiness Hair  loss Nausea Pain, redness, or swelling with sores inside the mouth or throat Vomiting This list may not describe all possible side effects. Call your doctor for medical advice about side effects. You may report side effects to FDA at 1-800-FDA-1088. Where should I keep my medication? This medication is given in a hospital or clinic. It will not be stored at home. NOTE: This sheet is a summary. It may not cover all possible information. If you have questions about this medicine, talk to your doctor, pharmacist, or health care provider.  2024 Elsevier/Gold Standard (2021-06-17 00:00:00)  Cisplatin Injection What is this medication? CISPLATIN (SIS pla tin) treats some types of cancer. It works by slowing down the growth of cancer cells. This medicine may be used for other purposes; ask your health care provider or pharmacist if you have questions. COMMON BRAND NAME(S): Platinol, Platinol -AQ What should I tell my care team before I take this medication? They need to know if you have any of these conditions: Eye disease, vision problems Hearing problems Kidney disease Low blood  counts, such as low white cells, platelets, or red blood cells Tingling of the fingers or toes, or other nerve disorder An unusual or allergic reaction to cisplatin, carboplatin, oxaliplatin, other medications, foods, dyes, or preservatives If you or your partner are pregnant or trying to get pregnant Breast-feeding How should I use this medication? This medication is injected into a vein. It is given by your care team in a hospital or clinic setting. Talk to your care team about the use of this medication in children. Special care may be needed. Overdosage: If you think you have taken too much of this medicine contact a poison control center or emergency room at once. NOTE: This medicine is only for you. Do not share this medicine with others. What if I miss a dose? Keep appointments for follow-up doses. It is  important not to miss your dose. Call your care team if you are unable to keep an appointment. What may interact with this medication? Do not take this medication with any of the following: Live virus vaccines This medication may also interact with the following: Certain antibiotics, such as amikacin, gentamicin, neomycin, polymyxin B, streptomycin, tobramycin, vancomycin Foscarnet This list may not describe all possible interactions. Give your health care provider a list of all the medicines, herbs, non-prescription drugs, or dietary supplements you use. Also tell them if you smoke, drink alcohol, or use illegal drugs. Some items may interact with your medicine. What should I watch for while using this medication? Your condition will be monitored carefully while you are receiving this medication. You may need blood work done while taking this medication. This medication may make you feel generally unwell. This is not uncommon, as chemotherapy can affect healthy cells as well as cancer cells. Report any side effects. Continue your course of treatment even though you feel ill unless your care team tells you to stop. This medication may increase your risk of getting an infection. Call your care team for advice if you get a fever, chills, sore throat, or other symptoms of a cold or flu. Do not treat yourself. Try to avoid being around people who are sick. Avoid taking medications that contain aspirin, acetaminophen, ibuprofen, naproxen, or ketoprofen unless instructed by your care team. These medications may hide a fever. This medication may increase your risk to bruise or bleed. Call your care team if you notice any unusual bleeding. Be careful brushing or flossing your teeth or using a toothpick because you may get an infection or bleed more easily. If you have any dental work done, tell your dentist you are receiving this medication. Drink fluids as directed while you are taking this medication. This  will help protect your kidneys. Call your care team if you get diarrhea. Do not treat yourself. Talk to your care team if you or your partner wish to become pregnant or think you might be pregnant. This medication can cause serious birth defects if taken during pregnancy and for 14 months after the last dose. A negative pregnancy test is required before starting this medication. A reliable form of contraception is recommended while taking this medication and for 14 months after the last dose. Talk to your care team about effective forms of contraception. Do not father a child while taking this medication and for 11 months after the last dose. Use a condom during sex during this time period. Do not breast-feed while taking this medication. This medication may cause infertility. Talk to your care team if you are  concerned about your fertility. What side effects may I notice from receiving this medication? Side effects that you should report to your care team as soon as possible: Allergic reactions--skin rash, itching, hives, swelling of the face, lips, tongue, or throat Eye pain, change in vision, vision loss Hearing loss, ringing in ears Infection--fever, chills, cough, sore throat, wounds that don't heal, pain or trouble when passing urine, general feeling of discomfort or being unwell Kidney injury--decrease in the amount of urine, swelling of the ankles, hands, or feet Low red blood cell level--unusual weakness or fatigue, dizziness, headache, trouble breathing Painful swelling, warmth, or redness of the skin, blisters or sores at the infusion site Pain, tingling, or numbness in the hands or feet Unusual bruising or bleeding Side effects that usually do not require medical attention (report to your care team if they continue or are bothersome): Hair loss Nausea Vomiting This list may not describe all possible side effects. Call your doctor for medical advice about side effects. You may report  side effects to FDA at 1-800-FDA-1088. Where should I keep my medication? This medication is given in a hospital or clinic. It will not be stored at home. NOTE: This sheet is a summary. It may not cover all possible information. If you have questions about this medicine, talk to your doctor, pharmacist, or health care provider.  2024 Elsevier/Gold Standard (2021-06-13 00:00:00)

## 2022-09-18 NOTE — Progress Notes (Signed)
Per Dr. Alena Bills okay to run post hydration fluids with Cisplatin.

## 2022-09-18 NOTE — Progress Notes (Signed)
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  Past medical history of sarcoidosis.  Patient receiving cisplatin, gemcitabine, durvalumab.    Met with patient during infusion.  Reports decreased appetite for the last 8 weeks but especially poor appetite for the last 3-4 weeks.  Reports nausea, recently received nausea medications.  Reports diarrhea, starting imodium.  Reports reflux, takes Tums.  Yesterday able to eat a reuben sandwich and chicken, grape salad sandwich on whole wheat bread and cheese and crackers.  Likes soups.  Drinks diet coke and water.  Son lives with patient and able to help prepare meals and grocery shop.     Medications: creon, compazine, zofran, decadron  Labs: calcium 8.8  Anthropometrics:   Height: 69 inches Weight: 247 lb 7/25 20 lb weight loss per patient but unsure time frame BMI: 36  7% weight loss but unsure time frame.    Estimated Energy Needs  Kcals: 2400-2800 Protein: 120-140 g Fluid: 2400-2800 ml  NUTRITION DIAGNOSIS: Inadequate oral intake related to cancer and related treatment side effects as evidenced by 7% weight loss, decreased intake with nausea, diarrhea and reflux   INTERVENTION:  Reviewed foods to choose with nausea and diarrhea. Handout provided Encouraged good sources of protein Encouraged taking antinausea medication and antidiarrheal.   Encouraged patient to discuss reflux medication with MD if Tums not working.  Contact information provided  MONITORING, EVALUATION, GOAL: weight trends, intake   NEXT VISIT: Friday, August 23 during infusion  Chirag Krueger B. Freida Busman, RD, LDN Registered Dietitian [phone]

## 2022-09-21 ENCOUNTER — Telehealth: Payer: Self-pay

## 2022-09-21 NOTE — Telephone Encounter (Signed)
Telephone call to patient for follow up after receiving first infusion.   Patient states infusion went great.  States eating good and drinking plenty of fluids.   Denies any nausea or vomiting.  Encouraged patient to call for any concerns or questions. 

## 2022-09-22 ENCOUNTER — Encounter: Payer: Self-pay | Admitting: Internal Medicine

## 2022-09-23 ENCOUNTER — Encounter: Payer: Self-pay | Admitting: Internal Medicine

## 2022-09-24 ENCOUNTER — Encounter: Payer: Self-pay | Admitting: *Deleted

## 2022-09-24 ENCOUNTER — Inpatient Hospital Stay: Payer: BC Managed Care – PPO

## 2022-09-24 ENCOUNTER — Inpatient Hospital Stay: Payer: BC Managed Care – PPO | Attending: Internal Medicine

## 2022-09-24 ENCOUNTER — Encounter: Payer: Self-pay | Admitting: Internal Medicine

## 2022-09-24 ENCOUNTER — Inpatient Hospital Stay (HOSPITAL_BASED_OUTPATIENT_CLINIC_OR_DEPARTMENT_OTHER): Payer: BC Managed Care – PPO | Admitting: Internal Medicine

## 2022-09-24 VITALS — BP 132/93 | HR 105 | Temp 97.3°F | Wt 240.6 lb

## 2022-09-24 VITALS — BP 155/102 | HR 90 | Temp 99.0°F | Resp 19

## 2022-09-24 DIAGNOSIS — Z79899 Other long term (current) drug therapy: Secondary | ICD-10-CM | POA: Diagnosis not present

## 2022-09-24 DIAGNOSIS — C221 Intrahepatic bile duct carcinoma: Secondary | ICD-10-CM

## 2022-09-24 DIAGNOSIS — R978 Other abnormal tumor markers: Secondary | ICD-10-CM | POA: Insufficient documentation

## 2022-09-24 DIAGNOSIS — Z5111 Encounter for antineoplastic chemotherapy: Secondary | ICD-10-CM

## 2022-09-24 DIAGNOSIS — D869 Sarcoidosis, unspecified: Secondary | ICD-10-CM | POA: Insufficient documentation

## 2022-09-24 DIAGNOSIS — C7800 Secondary malignant neoplasm of unspecified lung: Secondary | ICD-10-CM | POA: Insufficient documentation

## 2022-09-24 DIAGNOSIS — C7801 Secondary malignant neoplasm of right lung: Secondary | ICD-10-CM

## 2022-09-24 DIAGNOSIS — R7401 Elevation of levels of liver transaminase levels: Secondary | ICD-10-CM | POA: Diagnosis not present

## 2022-09-24 DIAGNOSIS — C7802 Secondary malignant neoplasm of left lung: Secondary | ICD-10-CM

## 2022-09-24 DIAGNOSIS — I119 Hypertensive heart disease without heart failure: Secondary | ICD-10-CM | POA: Diagnosis not present

## 2022-09-24 LAB — CBC WITH DIFFERENTIAL (CANCER CENTER ONLY)
Abs Immature Granulocytes: 0 10*3/uL (ref 0.00–0.07)
Basophils Absolute: 0 10*3/uL (ref 0.0–0.1)
Basophils Relative: 1 %
Eosinophils Absolute: 0.1 10*3/uL (ref 0.0–0.5)
Eosinophils Relative: 2 %
HCT: 40.2 % (ref 36.0–46.0)
Hemoglobin: 13.8 g/dL (ref 12.0–15.0)
Immature Granulocytes: 0 %
Lymphocytes Relative: 26 %
Lymphs Abs: 0.8 10*3/uL (ref 0.7–4.0)
MCH: 33.7 pg (ref 26.0–34.0)
MCHC: 34.3 g/dL (ref 30.0–36.0)
MCV: 98 fL (ref 80.0–100.0)
Monocytes Absolute: 0.1 10*3/uL (ref 0.1–1.0)
Monocytes Relative: 4 %
Neutro Abs: 2 10*3/uL (ref 1.7–7.7)
Neutrophils Relative %: 67 %
Platelet Count: 214 10*3/uL (ref 150–400)
RBC: 4.1 MIL/uL (ref 3.87–5.11)
RDW: 11.9 % (ref 11.5–15.5)
WBC Count: 2.9 10*3/uL — ABNORMAL LOW (ref 4.0–10.5)
nRBC: 0 % (ref 0.0–0.2)

## 2022-09-24 LAB — CMP (CANCER CENTER ONLY)
ALT: 103 U/L — ABNORMAL HIGH (ref 0–44)
AST: 54 U/L — ABNORMAL HIGH (ref 15–41)
Albumin: 3.8 g/dL (ref 3.5–5.0)
Alkaline Phosphatase: 115 U/L (ref 38–126)
Anion gap: 10 (ref 5–15)
BUN: 13 mg/dL (ref 6–20)
CO2: 23 mmol/L (ref 22–32)
Calcium: 9.4 mg/dL (ref 8.9–10.3)
Chloride: 103 mmol/L (ref 98–111)
Creatinine: 0.71 mg/dL (ref 0.44–1.00)
GFR, Estimated: >60 mL/min (ref >60)
Glucose, Bld: 118 mg/dL — ABNORMAL HIGH (ref 70–99)
Potassium: 3.7 mmol/L (ref 3.5–5.1)
Sodium: 136 mmol/L (ref 135–145)
Total Bilirubin: 0.5 mg/dL (ref 0.3–1.2)
Total Protein: 7.5 g/dL (ref 6.5–8.1)

## 2022-09-24 LAB — MAGNESIUM: Magnesium: 2.2 mg/dL (ref 1.7–2.4)

## 2022-09-24 MED ORDER — SODIUM CHLORIDE 0.9 % IV SOLN
Freq: Once | INTRAVENOUS | Status: AC
  Filled 2022-09-24: qty 250

## 2022-09-24 MED ORDER — SODIUM CHLORIDE 0.9 % IV SOLN
25.0000 mg/m2 | Freq: Once | INTRAVENOUS | Status: AC
  Administered 2022-09-24: 58 mg via INTRAVENOUS
  Filled 2022-09-24: qty 28.36

## 2022-09-24 MED ORDER — SODIUM CHLORIDE 0.9 % IV SOLN
1000.0000 mg/m2 | Freq: Once | INTRAVENOUS | Status: AC
  Administered 2022-09-24: 2318 mg via INTRAVENOUS
  Filled 2022-09-24: qty 60.96

## 2022-09-24 MED ORDER — SODIUM CHLORIDE 0.9 % IV SOLN
150.0000 mg | Freq: Once | INTRAVENOUS | Status: AC
  Administered 2022-09-24: 150 mg via INTRAVENOUS
  Filled 2022-09-24: qty 150

## 2022-09-24 MED ORDER — SODIUM CHLORIDE 0.9 % IV SOLN
10.0000 mg | Freq: Once | INTRAVENOUS | Status: AC
  Administered 2022-09-24: 10 mg via INTRAVENOUS
  Filled 2022-09-24: qty 10

## 2022-09-24 MED ORDER — PALONOSETRON HCL INJECTION 0.25 MG/5ML
0.2500 mg | Freq: Once | INTRAVENOUS | Status: AC
  Administered 2022-09-24: 0.25 mg via INTRAVENOUS
  Filled 2022-09-24: qty 5

## 2022-09-24 MED ORDER — SODIUM CHLORIDE 0.9 % IV SOLN
Freq: Once | INTRAVENOUS | Status: DC
  Filled 2022-09-24: qty 250

## 2022-09-24 MED ORDER — HEPARIN SOD (PORK) LOCK FLUSH 100 UNIT/ML IV SOLN
500.0000 [IU] | Freq: Once | INTRAVENOUS | Status: AC | PRN
  Administered 2022-09-24: 500 [IU]
  Filled 2022-09-24: qty 5

## 2022-09-24 MED ORDER — POTASSIUM CHLORIDE IN NACL 20-0.9 MEQ/L-% IV SOLN
Freq: Once | INTRAVENOUS | Status: AC
  Filled 2022-09-24: qty 1000

## 2022-09-24 MED ORDER — MAGNESIUM SULFATE 2 GM/50ML IV SOLN
2.0000 g | Freq: Once | INTRAVENOUS | Status: AC
  Administered 2022-09-24: 2 g via INTRAVENOUS
  Filled 2022-09-24: qty 50

## 2022-09-24 NOTE — Progress Notes (Signed)
B?P 155/102. Pt reports this is WNL while here. Pt denies any s/s or complaints at this time. On Call MD, Dr. Donneta Romberg okay to discharge home, pt educated to monitor B/P at home and call with any concerns. Pt verbalizes understanding. Secure chat message sent to Dr. Alena Bills for follow up per Dr. Donneta Romberg. Pt stable at discharge.

## 2022-09-24 NOTE — Progress Notes (Signed)
Patient says that she is doing so much better. Her blood pressure was so much better.

## 2022-09-24 NOTE — Progress Notes (Signed)
[facility] CONSULT NOTE  Patient Care Team: Reubin Milan, MD as PCP - General (Internal Medicine) Glory Buff, RN as Oncology Nurse Navigator Michaelyn Barter, MD as Consulting Physician (Oncology)   CANCER STAGING   Cancer Staging  Cholangiocarcinoma [facility]) Staging form: Intrahepatic Bile Duct, AJCC 8th Edition - Clinical stage from 08/26/2022: Stage IV (cM1) - Signed by Michaelyn Barter, MD on 09/03/2022 Stage prefix: Initial diagnosis   ASSESSMENT & PLAN:  Vicki Phillips 57 y.o. 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  # Cholangiocarcinoma, stage IV - Patient reports she presented to ED with complaints of chest pain.  Had chest x-ray which showed pulmonary nodules was referred to pulmonary.  Records not available.  Was then seen by Dr. Aundria Rud on 08/05/2022. CT chest showed innumerable pulmonary nodules of varying sizes throughout the lung.  Largest spiculated appearing nodule of anterior right upper lobe measuring 2 x 2 cm, at least 1 ill-defined hypodense lesion of the anterior left lobe of the liver.   -PET/CT from 7-24 showed large tumor burden with numerous pulmonary nodules bilaterally.  Index lesion measuring 2 cm in the left upper lobe SUV 8.3, subpleural lateral left lower lobe measuring 1.5 cm SUV 10.2, posterior right base 2 cm SUV 5.5, left supraclavicular lymph node 8 mm SUV 4.5, numerous liver masses, dominant lesion 8 cm in segment 4B SUV 12.7 with additional lesions in segment 5 6 and 4a, multiple abdominal lymph nodes are identified.  Pancreas looks normal.  Tracer avid sclerotic lesion within T3 SUV 4.5.  - s/p liver biopsy on 08/26/2022 showed metastatic moderately differentiated adenocarcinoma.  IHC stain positive for CK7 and CK20.  TTF-1, PAX8, GATA3 and CDX2 are negative.  Differentials include cholangiocarcinoma and pancreatic carcinoma.  Pancreatic cancer is less  likely since it looks normal on the imaging.   -MRI brain with and without contrast done for headaches was negative for metastasis.  Plan- -patient sought second opinion with Dr. Hulen Luster at [facility] who agreed with the treatment plan.  Plan for follow-up in 3 months.  -Labs reviewed and acceptable for treatment.  Seen today prior to cycle 1 day 8 of cisplatin and gemcitabine. CBC showed WBC of 2.9, ANC 2, hemoglobin 13.8 and platelets normal.  AST 54, ALT 103, ALP 115, total bilirubin 0.5.  Magnesium 2.2.  Mild elevation in LFTs noted.  It is less than 3 times upper limit of normal.  Dosing guidelines were reviewed and dose adjustment is not necessary.  Will proceed with full dose of cisplatin and gemcitabine.  Repeat LFTs next week.  -PD-L1 0%.  Full foundation 1 report pending. -Pretreatment CA 19-9 352.  Repeat CA 19-9 with cycle 2.  # Transaminitis -Plan as above  # Elevated blood pressure -Continue with amlodipine 5 mg daily.  Blood pressure improved  # Cough -Continue with over-the-counter Robitussin DM  # Diarrhea -GI panel and C. difficile was negative -Has been using Imodium.  Now having constipation.  Got MiraLAX.  # Weight loss # Poor appetite -Discussed about nutrition consult and appetite stimulant.  Would like to hold off  # History of sarcoidosis -Management per pulmonary -Never required treatment.   Orders Placed This Encounter  Procedures   Comprehensive metabolic panel    Standing Status:   Future    Standing Expiration Date:   09/24/2023   Cancer antigen 19-9    Standing Status:   Future  Standing Expiration Date:   09/24/2023   RTC in 2 weeks for MD visit, labs, cycle 2-day 1 of cisplatin, gemcitabine and durvalumab.  The total time spent in the appointment was 30 minutes encounter with patients including review of chart and various tests results, discussions about plan of care and coordination of care plan   All questions were answered. The patient knows  to call the clinic with any problems, questions or concerns. No barriers to learning was detected.  Michaelyn Barter, MD 8/1/20249:49 AM   HISTORY OF PRESENTING ILLNESS:  Vicki Phillips 57 y.o. 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  Patient reports she presented to ED with complaints of chest pain.  Had chest x-ray which showed pulmonary nodules was referred to pulmonary.  Records not available.  Was then seen by Dr. Aundria Rud on 08/05/2022.  CT chest showed innumerable pulmonary nodules of varying sizes throughout the lung.  Largest spiculated appearing nodule of anterior right upper lobe measuring 2 x 2 cm, at least 1 ill-defined hypodense lesion of the anterior left lobe of the liver.  Despite stated history of sarcoidosis, there are no specific or characteristic features of sarcoidosis with respect to distribution, morphology, associated fibrotic change, nor associated mediastinal lymphadenopathy. 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  She has history of sarcoidosis but was never on treatment.  Reports that she does not see doctors on a regular basis because she is had bad experiences.  Interval history Patient seen today prior to cycle 1 day 8 of cis/gem accompanied: Accompanied with son.  Patient did very well with the first treatment of chemotherapy.  Had good energy level until Tuesday when she started feeling fatigue some nausea which was manageable with antiemetics.  Was having some pain in her right knee.  Had mild neuropathy while she was having infusion which had resolved.  Denies any hearing changes.  Diarrhea responded to Imodium and now she is constipated.  She got some MiraLAX.  She is taking Robitussin DM which is helping with cough.   She started amlodipine which is helping with the blood pressure.  Her spirits were high today.  I have reviewed her chart and materials related to her cancer extensively and collaborated history with the patient. Summary of oncologic history is as follows: Oncology History  Cholangiocarcinoma (HCC)  08/26/2022 Cancer Staging   Staging form: Intrahepatic Bile Duct, AJCC 8th Edition - Clinical stage from 08/26/2022: Stage IV (cM1) - Signed by Michaelyn Barter, MD on 09/03/2022 Stage prefix: Initial diagnosis   09/03/2022 Initial Diagnosis   Cholangiocarcinoma (HCC)   09/18/2022 -  Chemotherapy   Patient is on Treatment Plan : BILIARY TRACT Cisplatin + Gemcitabine D1,8 + Durvalumab (1500) D1 q21d / Durvalumab (1500) q28d     Metastasis to lung (HCC)  09/03/2022 Initial Diagnosis   Metastasis to lung (HCC)   09/18/2022 -  Chemotherapy   Patient is on Treatment Plan : BILIARY TRACT Cisplatin + Gemcitabine D1,8 + Durvalumab (1500) D1 q21d / Durvalumab (1500) q28d       MEDICAL HISTORY:  Past Medical History:  Diagnosis Date   Allergy    Sarcoidosis 2011    SURGICAL HISTORY: Past Surgical History:  Procedure Laterality Date   CESAREAN SECTION     x 2   COLONOSCOPY WITH PROPOFOL  N/A 08/10/2019   Procedure: COLONOSCOPY WITH PROPOFOL;  Surgeon: Midge Minium, MD;  Location: [facility];  Service: Endoscopy;  Laterality: N/A;  priority 4   IR IMAGING GUIDED PORT INSERTION  09/11/2022   LUNG BIOPSY      SOCIAL HISTORY: Social History   Socioeconomic History   Marital status: Single    Spouse name: Not on file   Number of children: 2   Years of education: Not on file   Highest education level: Not on file  Occupational History   Not on file  Tobacco Use   Smoking status: Never   Smokeless tobacco: Never  Vaping Use   Vaping status: Never Used  Substance and Sexual Activity   Alcohol use: Yes    Alcohol/week: 10.0 standard drinks of alcohol    Types: 10 Standard drinks or  equivalent per week   Drug use: No   Sexual activity: Yes    Partners: Male    Birth control/protection: Surgical  Other Topics Concern   Not on file  Social History Narrative   Not on file   Social Determinants of Health   Financial Resource Strain: Low Risk  (09/18/2022)   Overall Financial Resource Strain (CARDIA)    Difficulty of Paying Living Expenses: Not hard at all  Food Insecurity: No Food Insecurity (08/24/2022)   Hunger Vital Sign    Worried About Running Out of Food in the Last Year: Never true    Ran Out of Food in the Last Year: Never true  Transportation Needs: No Transportation Needs (08/24/2022)   PRAPARE - Administrator, Civil Service (Medical): No    Lack of Transportation (Non-Medical): No  Physical Activity: Not on file  Stress: Not on file  Social Connections: Not on file  Intimate Partner Violence: Not At Risk (08/24/2022)   Humiliation, Afraid, Rape, and Kick questionnaire    Fear of Current or Ex-Partner: No    Emotionally Abused: No    Physically Abused: No    Sexually Abused: No    FAMILY HISTORY: Family History  Problem Relation Age of Onset   Stroke Mother    Heart disease Mother    Heart attack Father 47   Heart disease Father    Kidney disease Sister     ALLERGIES:  is allergic to other.  MEDICATIONS:  Current Outpatient Medications  Medication Sig Dispense Refill   amLODipine (NORVASC) 5 MG tablet Take 1 tablet (5 mg total) by mouth daily. 30 tablet 0   dexamethasone (DECADRON) 4 MG tablet Take 2 tablets (8 mg) by mouth daily x 3 days starting the day after cisplatin chemotherapy. Take with food. 30 tablet 1   lidocaine-prilocaine (EMLA) cream Apply 1 Application topically as needed. 30 g 0   lidocaine-prilocaine (EMLA) cream Apply to affected area once 30 g 3   lipase/protease/amylase (CREON) 36000 UNITS CPEP capsule Take 1 capsule (36,000 Units total) by mouth 3 (three) times daily with meals. May also take 1 capsule (36,000  Units total) as needed (with snacks). 240 capsule 1   loratadine (CLARITIN) 10 MG tablet Take 10 mg by mouth daily.     ondansetron (ZOFRAN) 8 MG tablet Take 1 tablet (8 mg total) by mouth every 8 (eight) hours as needed for nausea or vomiting. Start on the third day after cisplatin. 30 tablet 1   prochlorperazine (COMPAZINE) 10 MG tablet Take 1 tablet (10 mg total) by mouth every 6 (six) hours as needed (Nausea or vomiting).  30 tablet 1   pseudoephedrine-acetaminophen (TYLENOL SINUS) 30-500 MG TABS tablet Take 1 tablet by mouth in the morning and at bedtime.     No current facility-administered medications for this visit.   Facility-Administered Medications Ordered in Other Visits  Medication Dose Route Frequency Provider Last Rate Last Admin   0.9 %  sodium chloride infusion   Intravenous Once Michaelyn Barter, MD       0.9 % NaCl with KCl 20 mEq/ L  infusion   Intravenous Once Michaelyn Barter, MD       CISplatin (PLATINOL) 58 mg in sodium chloride 0.9 % 250 mL chemo infusion  25 mg/m2 (Treatment Plan Recorded) Intravenous Once Michaelyn Barter, MD       dexamethasone (DECADRON) 10 mg in sodium chloride 0.9 % 50 mL IVPB  10 mg Intravenous Once Michaelyn Barter, MD       fosaprepitant (EMEND) 150 mg in sodium chloride 0.9 % 145 mL IVPB  150 mg Intravenous Once Michaelyn Barter, MD       gemcitabine (GEMZAR) 2,318 mg in sodium chloride 0.9 % 250 mL chemo infusion  1,000 mg/m2 (Treatment Plan Recorded) Intravenous Once Michaelyn Barter, MD       magnesium sulfate IVPB 2 g 50 mL  2 g Intravenous Once Michaelyn Barter, MD       palonosetron (ALOXI) injection 0.25 mg  0.25 mg Intravenous Once Michaelyn Barter, MD        REVIEW OF SYSTEMS:   Pertinent information mentioned in HPI All other systems were reviewed with the patient and are negative.  PHYSICAL EXAMINATION: ECOG PERFORMANCE STATUS: 1 - Symptomatic but completely ambulatory  Vitals:   09/24/22 0904  BP: (!) 132/93  Pulse: (!) 105  Temp:  (!) 97.3 F (36.3 C)  SpO2: 100%    Filed Weights   09/24/22 0904  Weight: 240 lb 9.6 oz (109.1 kg)     GENERAL:alert, no distress and comfortable SKIN: skin color, texture, turgor are normal, no rashes or significant lesions EYES: normal, conjunctiva are pink and non-injected, sclera clear OROPHARYNX:no exudate, no erythema and lips, buccal mucosa, and tongue normal  NECK: supple, thyroid normal size, non-tender, without nodularity LYMPH:  no palpable lymphadenopathy in the cervical, axillary or inguinal LUNGS: clear to auscultation and percussion with normal breathing effort HEART: regular rate & rhythm and no murmurs and no lower extremity edema ABDOMEN:abdomen soft, non-tender and normal bowel sounds Musculoskeletal:no cyanosis of digits and no clubbing  PSYCH: alert & oriented x 3 with fluent speech NEURO: no focal motor/sensory deficits  LABORATORY DATA:  I have reviewed the data as listed Lab Results  Component Value Date   WBC 2.9 (L) 09/24/2022   HGB 13.8 09/24/2022   HCT 40.2 09/24/2022   MCV 98.0 09/24/2022   PLT 214 09/24/2022   Recent Labs    09/03/22 1103 09/17/22 0925 09/24/22 0847  NA 138 138 136  K 3.8 3.8 3.7  CL 102 104 103  CO2 24 23 23   GLUCOSE 104* 93 118*  BUN 12 8 13   CREATININE 0.62 0.56 0.71  CALCIUM 9.3 8.8* 9.4  GFRNONAA >60 >60 >60  PROT 8.1 7.3 7.5  ALBUMIN 4.5 3.8 3.8  AST 41 31 54*  ALT 28 22 103*  ALKPHOS 131* 115 115  BILITOT 0.7 0.6 0.5    RADIOGRAPHIC STUDIES: I have personally reviewed the radiological images as listed and agreed with the findings in the report. IR IMAGING GUIDED PORT INSERTION  Result Date: 09/11/2022 INDICATION:  Chemotherapy EXAM: Chest port placement using ultrasound and fluoroscopic guidance MEDICATIONS: Documented in the EMR ANESTHESIA/SEDATION: Moderate (conscious) sedation was employed during this procedure. A total of Versed 2 mg and Fentanyl 100 mcg was administered intravenously. Moderate  Sedation Time: 30 minutes. The patient's level of consciousness and vital signs were monitored continuously by radiology nursing throughout the procedure under my direct supervision. FLUOROSCOPY TIME:  Fluoroscopy Time: 0.4 minutes (1 mGy) COMPLICATIONS: None immediate. PROCEDURE: Informed written consent was obtained from the patient after a thorough discussion of the procedural risks, benefits and alternatives. All questions were addressed. Maximal Sterile Barrier Technique was utilized including caps, mask, sterile gowns, sterile gloves, sterile drape, hand hygiene and skin antiseptic. A timeout was performed prior to the initiation of the procedure. The patient was placed supine on the exam table. The right neck and chest was prepped and draped in the standard sterile fashion. A preliminary ultrasound of the right neck was performed and demonstrates a patent right internal jugular vein. A permanent ultrasound image was stored in the electronic medical record. The overlying skin was anesthetized with 1% Lidocaine. Using ultrasound guidance, access was obtained into the right internal jugular vein using a 21 gauge micropuncture set. A wire was advanced into the SVC, a short incision was made at the puncture site, and serial dilatation performed. Next, in an ipsilateral infraclavicular location, an incision was made at the site of the subcutaneous reservoir. Blunt dissection was used to open a pocket to contain the reservoir. A subcutaneous tunnel was then created from the port site to the puncture site. A(n) 8 Fr single lumen catheter was advanced through the tunnel. The catheter was attached to the port and this was placed in the subcutaneous pocket. Under fluoroscopic guidance, a peel away sheath was placed, and the catheter was trimmed to the appropriate length and was advanced into the central veins. The catheter length is 24 cm. The tip of the catheter lies near the superior cavoatrial junction. The port  flushes and aspirates appropriately. The port was flushed and locked with heparinized saline. The port pocket was closed in 2 layers using 3-0 and 4-0 Vicryl/absorbable suture. Dermabond was also applied to both incisions. The patient tolerated the procedure well and was transferred to recovery in stable condition. IMPRESSION: Successful placement of a right-sided chest port via the right internal jugular vein. The port is ready for immediate use. Electronically Signed   By: Olive Bass M.D.   On: 09/11/2022 15:52   CT ABDOMEN PELVIS W CONTRAST  Result Date: 08/31/2022 CLINICAL DATA:  57 year old female with history of multiple pulmonary nodules. Evaluate for source of malignancy. * Tracking Code: BO * EXAM: CT ABDOMEN AND PELVIS WITH CONTRAST TECHNIQUE: Multidetector CT imaging of the abdomen and pelvis was performed using the standard protocol following bolus administration of intravenous contrast. RADIATION DOSE REDUCTION: This exam was performed according to the departmental dose-optimization program which includes automated exposure control, adjustment of the mA and/or kV according to patient size and/or use of iterative reconstruction technique. CONTRAST:  OMNIPAQUE IOHEXOL 350 MG/ML SOLN COMPARISON:  PET-CT 08/25/2022. FINDINGS: Lower chest: Innumerable pulmonary nodules are again noted, better demonstrated on prior chest CT, likely reflective of widespread metastatic disease to the lungs. Hepatobiliary: Multiple hypovascular hepatic lesions are noted, largest of which is centered in segments 4A and 4B (axial image 24 of series 2 and coronal image 31 of series 5) measuring approximately 8.0 x 7.1 x 7.2 cm. Several other smaller lesions are noted, largest  of which is in the inferior aspect of segment 5 (axial image 35 of series 2) measuring 1.5 x 1.2 cm. No intra or extrahepatic biliary ductal dilatation. Gallbladder is only mildly distended, but filled with high attenuation material. No  pericholecystic fluid or surrounding inflammatory changes. Pancreas: No pancreatic mass. No pancreatic ductal dilatation. No pancreatic or peripancreatic fluid collections or inflammatory changes. Spleen: Unremarkable. Adrenals/Urinary Tract: Bilateral kidneys and adrenal glands are normal in appearance. No hydroureteronephrosis. Urinary bladder is normal in appearance. Stomach/Bowel: The appearance of the stomach is normal. No pathologic dilatation of small bowel or colon. Numerous colonic diverticuli are noted, without surrounding inflammatory changes to suggest an acute diverticulitis at this time. Normal appendix. Vascular/Lymphatic: No atherosclerotic calcifications, aneurysm or dissection noted in the abdominal or pelvic vasculature. Numerous prominent borderline enlarged and mildly enlarged lymph nodes are noted in the upper abdomen and retroperitoneum, including a 1.3 cm short axis portacaval lymph node (axial image 26 of series 2) and numerous para-aortic lymph nodes measuring up to 1.2 cm in short axis (axial image 33 of series 2) adjacent to the left renal hilum. No definite pelvic lymphadenopathy. Reproductive: Uterus and ovaries are unremarkable in appearance. Other: No significant volume of ascites.  No pneumoperitoneum. Musculoskeletal: There are no aggressive appearing lytic or blastic lesions noted in the visualized portions of the skeleton. IMPRESSION: 1. Multiple liver lesions, most notable for a dominant lesion centered in segments 4A and 4B of the liver estimated to measure approximately 8.0 x 7.1 x 7.2 cm. These lesions are hypovascular and nonspecific in appearance. Whether this is a primary hepatic lesion with multifocal hepatic metastases or are all metastatic liver lesions is uncertain on today's examination. Tissue sampling should be considered if clinically appropriate. 2. Metastatic lymphadenopathy noted in the upper abdomen, similar to prior PET-CT. 3. Numerous pulmonary nodules  indicative of metastatic disease to the lungs. 4. Colonic diverticulosis without evidence of acute diverticulitis at this time. Electronically Signed   By: Trudie Reed M.D.   On: 08/31/2022 08:39   NM PET Image Initial (PI) Skull Base To Thigh (F-18 FDG)  Result Date: 08/30/2022 CLINICAL DATA:  Initial treatment strategy for pulmonary nodules. EXAM: NUCLEAR MEDICINE PET SKULL BASE TO THIGH TECHNIQUE: 12.69 mCi F-18 FDG was injected intravenously. Full-ring PET imaging was performed from the skull base to thigh after the radiotracer. CT data was obtained and used for attenuation correction and anatomic localization. Fasting blood glucose: 101 mg/dl COMPARISON:  CT chest 52/84/1324 FINDINGS: Mediastinal blood pool activity: SUV max 2.60 Liver activity: SUV max NA NECK: No hypermetabolic lymph nodes in the neck. Incidental CT findings: None. CHEST: Too numerous to count tracer avid pulmonary nodules are scattered throughout both lungs. -index lesion within the left upper lobe measures 2 cm with SUV max of 8.39, image 50/4. -Index lesion within the subpleural, lateral left upper lobe measures 1.5 cm with SUV max of 10.25, image 49/4. -index lesion in the posterior right base measures 2 cm with SUV max of 5.52, image 73/4. No tracer avid mediastinal or hilar lymph nodes. -Left supraclavicular lymph node measures 8 mm with SUV max 4.53, image 32/4. Incidental CT findings: Mild cardiac enlargement. ABDOMEN/PELVIS: Segment 4B liver masses tracer avid measuring 7.6 x 8.0 cm with SUV max of 12.73, image 88/4. There are additional, smaller tracer avid lesions noted in segment 5, 6 and segment 4a. No abnormal tracer uptake identified within the pancreas, spleen, or adrenal glands. Multiple tracer avid abdominal lymph nodes are identified. Index pre  caval lymph node measures 6 mm with SUV max 7.32, image 92/4. Index left retroperitoneal lymph node measures 1 cm with SUV max of 5.81, image 99/4. No tracer avid pelvic or  inguinal lymph nodes. Incidental CT findings: High attenuation material identified within the gallbladder which may reflect tumefactive sludge. SKELETON: Tracer avid sclerotic lesions identified within the T3 vertebra with SUV max 4.56, image 42/4. No additional tracer avid bone lesions identified. Incidental CT findings: None. IMPRESSION: 1. Too numerous to count tracer avid pulmonary nodules are identified throughout both lungs compatible with diffuse metastasis. 2. Tracer avid left supraclavicular and abdominal lymph nodes are identified compatible with nodal metastasis. 3. Multiple tracer avid liver lesions are identified. Dominant tracer avid liver metastasis is in segment 4B measuring 8 cm with SUV max of 12.73. 4. Tracer avid sclerotic lesion identified within the T3 vertebra. Electronically Signed   By: Signa Kell M.D.   On: 08/30/2022 11:23   US BIOPSY (LIVER)  Result Date: 08/26/2022 INDICATION: Liver mass EXAM: Ultrasound-guided core needle biopsy of liver mass MEDICATIONS: None. ANESTHESIA/SEDATION: Moderate (conscious) sedation was employed during this procedure. A total of Versed 2 mg and Fentanyl 50 mcg was administered intravenously by the radiology nurse. Total intra-service moderate Sedation Time: 10 minutes. The patient's level of consciousness and vital signs were monitored continuously by radiology nursing throughout the procedure under my direct supervision. COMPLICATIONS: None immediate. PROCEDURE: Informed written consent was obtained from the patient after a thorough discussion of the procedural risks, benefits and alternatives. All questions were addressed. Maximal Sterile Barrier Technique was utilized including caps, mask, sterile gowns, sterile gloves, sterile drape, hand hygiene and skin antiseptic. A timeout was performed prior to the initiation of the procedure. The patient was placed supine on the exam table. Ultrasound of the liver demonstrated large hypoechoic lesion  centrally located in the liver. Skin entry site was marked, and the overlying skin was prepped draped in the standard sterile fashion. Local analgesia was obtained with 1% lidocaine. Using ultrasound guidance, a 17 gauge introducer needle was advanced towards the identified lesion in the central portion of the liver. Subsequently, core needle biopsy was performed of the central liver lesion using an 18 gauge core biopsy device x4 total passes. Specimens were submitted in formalin to pathology for further handling. Limited postprocedure imaging demonstrated no hematoma. A clean dressing was placed after manual hemostasis. The patient tolerated the procedure well without immediate complication. IMPRESSION: Successful ultrasound-guided core needle biopsy of large lesion centrally located within the liver. Electronically Signed   By: Olive Bass M.D.   On: 08/26/2022 14:20   MR Brain W Wo Contrast  Result Date: 08/25/2022 CLINICAL DATA:  Metastatic disease evaluation, headaches and some memory loss EXAM: MRI HEAD WITHOUT AND WITH CONTRAST TECHNIQUE: Multiplanar, multiecho pulse sequences of the brain and surrounding structures were obtained without and with intravenous contrast. CONTRAST:  10mL GADAVIST GADOBUTROL 1 MMOL/ML IV SOLN COMPARISON:  No prior MRI available, correlation is made with 06/18/2006 CT head FINDINGS: Brain: No restricted diffusion to suggest acute or subacute infarct. No abnormal parenchymal or meningeal enhancement. No acute hemorrhage, mass, mass effect, or midline shift. No hydrocephalus or extra-axial collection. Normal pituitary and craniocervical junction. No hemosiderin deposition to suggest remote hemorrhage. Normal cerebral volume for age. Scattered T2 hyperintense signal in the periventricular white matter, likely the sequela of mild chronic small vessel ischemic disease. Vascular: Normal arterial flow voids. Normal arterial and venous enhancement. Skull and upper cervical spine:  Normal marrow signal.  Sinuses/Orbits: Clear paranasal sinuses. No acute finding in the orbits. Other: The mastoid air cells are well aerated. IMPRESSION: No acute intracranial process. No evidence of metastatic disease in the brain. Electronically Signed   By: Wiliam Ke M.D.   On: 08/25/2022 15:39

## 2022-09-24 NOTE — Patient Instructions (Signed)
[facility]  Discharge Instructions: Thank you for choosing [facility] to provide your oncology and hematology care.  If you have a lab appointment with the [facility], please go directly to the [facility] and check in at the registration area.  Wear comfortable clothing and clothing appropriate for easy access to any Portacath or PICC line.   We strive to give you quality time with your provider. You may need to reschedule your appointment if you arrive late (15 or more minutes).  Arriving late affects you and other patients whose appointments are after yours.  Also, if you miss three or more appointments without notifying the office, you may be dismissed from the clinic at the provider's discretion.      For prescription refill requests, have your pharmacy contact our office and allow 72 hours for refills to be completed.    Today you received the following chemotherapy and/or immunotherapy agents Cisplatin and Gemzar      To help prevent nausea and vomiting after your treatment, we encourage you to take your nausea medication as directed.  BELOW ARE SYMPTOMS THAT SHOULD BE REPORTED IMMEDIATELY: *FEVER GREATER THAN 100.4 F (38 C) OR HIGHER *CHILLS OR SWEATING *NAUSEA AND VOMITING THAT IS NOT CONTROLLED WITH YOUR NAUSEA MEDICATION *UNUSUAL SHORTNESS OF BREATH *UNUSUAL BRUISING OR BLEEDING *URINARY PROBLEMS (pain or burning when urinating, or frequent urination) *BOWEL PROBLEMS (unusual diarrhea, constipation, pain near the anus) TENDERNESS IN MOUTH AND THROAT WITH OR WITHOUT PRESENCE OF ULCERS (sore throat, sores in mouth, or a toothache) UNUSUAL RASH, SWELLING OR PAIN  UNUSUAL VAGINAL DISCHARGE OR ITCHING   Items with * indicate a potential emergency and should be followed up as soon as possible or go to the Emergency Department if any problems should occur.  Please show the CHEMOTHERAPY ALERT CARD or IMMUNOTHERAPY ALERT CARD at  check-in to the Emergency Department and triage nurse.  Should you have questions after your visit or need to cancel or reschedule your appointment, please contact [facility]  [phone] and follow the prompts.  Office hours are 8:00 a.m. to 4:30 p.m. Monday - Friday. Please note that voicemails left after 4:00 p.m. may not be returned until the following business day.  We are closed weekends and major holidays. You have access to a nurse at all times for urgent questions. Please call the main number to the clinic [phone] and follow the prompts.  For any non-urgent questions, you may also contact your provider using MyChart. We now offer e-Visits for anyone 38 and older to request care online for non-urgent symptoms. For details visit [email or web address].   Also download the MyChart app! Go to the app store, search "MyChart", open the app, select [facility], and log in with your MyChart username and password.

## 2022-09-25 ENCOUNTER — Encounter: Payer: Self-pay | Admitting: Internal Medicine

## 2022-09-25 ENCOUNTER — Inpatient Hospital Stay: Payer: BC Managed Care – PPO

## 2022-09-25 ENCOUNTER — Encounter: Payer: Self-pay | Admitting: *Deleted

## 2022-09-25 DIAGNOSIS — C7801 Secondary malignant neoplasm of right lung: Secondary | ICD-10-CM

## 2022-09-25 DIAGNOSIS — C221 Intrahepatic bile duct carcinoma: Secondary | ICD-10-CM | POA: Diagnosis not present

## 2022-09-25 DIAGNOSIS — Z5111 Encounter for antineoplastic chemotherapy: Secondary | ICD-10-CM | POA: Diagnosis not present

## 2022-09-25 DIAGNOSIS — D869 Sarcoidosis, unspecified: Secondary | ICD-10-CM | POA: Diagnosis not present

## 2022-09-25 DIAGNOSIS — Z79899 Other long term (current) drug therapy: Secondary | ICD-10-CM | POA: Diagnosis not present

## 2022-09-25 DIAGNOSIS — I119 Hypertensive heart disease without heart failure: Secondary | ICD-10-CM | POA: Diagnosis not present

## 2022-09-25 DIAGNOSIS — R978 Other abnormal tumor markers: Secondary | ICD-10-CM | POA: Diagnosis not present

## 2022-09-25 DIAGNOSIS — C7800 Secondary malignant neoplasm of unspecified lung: Secondary | ICD-10-CM | POA: Diagnosis not present

## 2022-09-25 MED ORDER — PEGFILGRASTIM-JMDB 6 MG/0.6ML ~~LOC~~ SOSY
6.0000 mg | PREFILLED_SYRINGE | Freq: Once | SUBCUTANEOUS | Status: AC
  Administered 2022-09-25: 6 mg via SUBCUTANEOUS
  Filled 2022-09-25: qty 0.6

## 2022-09-27 DIAGNOSIS — C221 Intrahepatic bile duct carcinoma: Secondary | ICD-10-CM | POA: Diagnosis not present

## 2022-09-28 ENCOUNTER — Encounter: Payer: Self-pay | Admitting: Internal Medicine

## 2022-09-29 ENCOUNTER — Encounter: Payer: Self-pay | Admitting: Internal Medicine

## 2022-10-01 ENCOUNTER — Inpatient Hospital Stay: Payer: BC Managed Care – PPO

## 2022-10-01 ENCOUNTER — Encounter: Payer: Self-pay | Admitting: Internal Medicine

## 2022-10-01 DIAGNOSIS — D869 Sarcoidosis, unspecified: Secondary | ICD-10-CM | POA: Diagnosis not present

## 2022-10-01 DIAGNOSIS — I119 Hypertensive heart disease without heart failure: Secondary | ICD-10-CM | POA: Diagnosis not present

## 2022-10-01 DIAGNOSIS — Z79899 Other long term (current) drug therapy: Secondary | ICD-10-CM | POA: Diagnosis not present

## 2022-10-01 DIAGNOSIS — C221 Intrahepatic bile duct carcinoma: Secondary | ICD-10-CM

## 2022-10-01 DIAGNOSIS — R978 Other abnormal tumor markers: Secondary | ICD-10-CM | POA: Diagnosis not present

## 2022-10-01 DIAGNOSIS — C7801 Secondary malignant neoplasm of right lung: Secondary | ICD-10-CM

## 2022-10-01 DIAGNOSIS — Z5111 Encounter for antineoplastic chemotherapy: Secondary | ICD-10-CM | POA: Diagnosis not present

## 2022-10-01 DIAGNOSIS — C7800 Secondary malignant neoplasm of unspecified lung: Secondary | ICD-10-CM | POA: Diagnosis not present

## 2022-10-01 LAB — CBC WITH DIFFERENTIAL (CANCER CENTER ONLY)
Abs Immature Granulocytes: 0.19 10*3/uL — ABNORMAL HIGH (ref 0.00–0.07)
Basophils Absolute: 0 10*3/uL (ref 0.0–0.1)
Basophils Relative: 0 %
Eosinophils Absolute: 0.1 10*3/uL (ref 0.0–0.5)
Eosinophils Relative: 1 %
HCT: 39.4 % (ref 36.0–46.0)
Hemoglobin: 13.4 g/dL (ref 12.0–15.0)
Immature Granulocytes: 2 %
Lymphocytes Relative: 15 %
Lymphs Abs: 1.4 10*3/uL (ref 0.7–4.0)
MCH: 33.3 pg (ref 26.0–34.0)
MCHC: 34 g/dL (ref 30.0–36.0)
MCV: 98 fL (ref 80.0–100.0)
Monocytes Absolute: 0.5 10*3/uL (ref 0.1–1.0)
Monocytes Relative: 6 %
Neutro Abs: 7.1 10*3/uL (ref 1.7–7.7)
Neutrophils Relative %: 76 %
Platelet Count: 49 10*3/uL — ABNORMAL LOW (ref 150–400)
RBC: 4.02 MIL/uL (ref 3.87–5.11)
RDW: 12 % (ref 11.5–15.5)
Smear Review: NORMAL
WBC Count: 9.3 10*3/uL (ref 4.0–10.5)
nRBC: 0 % (ref 0.0–0.2)

## 2022-10-01 LAB — CMP (CANCER CENTER ONLY)
ALT: 37 U/L (ref 0–44)
AST: 23 U/L (ref 15–41)
Albumin: 4.2 g/dL (ref 3.5–5.0)
Alkaline Phosphatase: 148 U/L — ABNORMAL HIGH (ref 38–126)
Anion gap: 8 (ref 5–15)
BUN: 10 mg/dL (ref 6–20)
CO2: 23 mmol/L (ref 22–32)
Calcium: 9.1 mg/dL (ref 8.9–10.3)
Chloride: 104 mmol/L (ref 98–111)
Creatinine: 0.73 mg/dL (ref 0.44–1.00)
GFR, Estimated: >60 mL/min (ref >60)
Glucose, Bld: 104 mg/dL — ABNORMAL HIGH (ref 70–99)
Potassium: 4.1 mmol/L (ref 3.5–5.1)
Sodium: 135 mmol/L (ref 135–145)
Total Bilirubin: 0.3 mg/dL (ref 0.3–1.2)
Total Protein: 7.2 g/dL (ref 6.5–8.1)

## 2022-10-01 LAB — MAGNESIUM: Magnesium: 2.1 mg/dL (ref 1.7–2.4)

## 2022-10-06 ENCOUNTER — Encounter: Payer: Self-pay | Admitting: Internal Medicine

## 2022-10-08 ENCOUNTER — Encounter: Payer: Self-pay | Admitting: Internal Medicine

## 2022-10-08 ENCOUNTER — Inpatient Hospital Stay (HOSPITAL_BASED_OUTPATIENT_CLINIC_OR_DEPARTMENT_OTHER): Payer: BC Managed Care – PPO | Admitting: Internal Medicine

## 2022-10-08 ENCOUNTER — Inpatient Hospital Stay: Payer: BC Managed Care – PPO

## 2022-10-08 VITALS — BP 137/100 | HR 92 | Temp 97.7°F | Resp 17 | Wt 242.0 lb

## 2022-10-08 DIAGNOSIS — C221 Intrahepatic bile duct carcinoma: Secondary | ICD-10-CM | POA: Diagnosis not present

## 2022-10-08 DIAGNOSIS — Z79899 Other long term (current) drug therapy: Secondary | ICD-10-CM | POA: Diagnosis not present

## 2022-10-08 DIAGNOSIS — I119 Hypertensive heart disease without heart failure: Secondary | ICD-10-CM | POA: Diagnosis not present

## 2022-10-08 DIAGNOSIS — C7802 Secondary malignant neoplasm of left lung: Secondary | ICD-10-CM

## 2022-10-08 DIAGNOSIS — C7801 Secondary malignant neoplasm of right lung: Secondary | ICD-10-CM | POA: Diagnosis not present

## 2022-10-08 DIAGNOSIS — R978 Other abnormal tumor markers: Secondary | ICD-10-CM | POA: Diagnosis not present

## 2022-10-08 DIAGNOSIS — R197 Diarrhea, unspecified: Secondary | ICD-10-CM

## 2022-10-08 DIAGNOSIS — Z5111 Encounter for antineoplastic chemotherapy: Secondary | ICD-10-CM

## 2022-10-08 DIAGNOSIS — R051 Acute cough: Secondary | ICD-10-CM

## 2022-10-08 DIAGNOSIS — Z5112 Encounter for antineoplastic immunotherapy: Secondary | ICD-10-CM | POA: Diagnosis not present

## 2022-10-08 DIAGNOSIS — C7800 Secondary malignant neoplasm of unspecified lung: Secondary | ICD-10-CM | POA: Diagnosis not present

## 2022-10-08 DIAGNOSIS — D869 Sarcoidosis, unspecified: Secondary | ICD-10-CM | POA: Diagnosis not present

## 2022-10-08 LAB — COMPREHENSIVE METABOLIC PANEL
ALT: 27 U/L (ref 0–44)
AST: 26 U/L (ref 15–41)
Albumin: 3.9 g/dL (ref 3.5–5.0)
Alkaline Phosphatase: 116 U/L (ref 38–126)
Anion gap: 7 (ref 5–15)
BUN: 14 mg/dL (ref 6–20)
CO2: 22 mmol/L (ref 22–32)
Calcium: 9 mg/dL (ref 8.9–10.3)
Chloride: 108 mmol/L (ref 98–111)
Creatinine, Ser: 0.73 mg/dL (ref 0.44–1.00)
GFR, Estimated: >60 mL/min (ref >60)
Glucose, Bld: 101 mg/dL — ABNORMAL HIGH (ref 70–99)
Potassium: 3.9 mmol/L (ref 3.5–5.1)
Sodium: 137 mmol/L (ref 135–145)
Total Bilirubin: 0.2 mg/dL — ABNORMAL LOW (ref 0.3–1.2)
Total Protein: 7 g/dL (ref 6.5–8.1)

## 2022-10-08 LAB — CBC WITH DIFFERENTIAL/PLATELET
Abs Immature Granulocytes: 0.09 10*3/uL — ABNORMAL HIGH (ref 0.00–0.07)
Basophils Absolute: 0 10*3/uL (ref 0.0–0.1)
Basophils Relative: 1 %
Eosinophils Absolute: 0.1 10*3/uL (ref 0.0–0.5)
Eosinophils Relative: 1 %
HCT: 37 % (ref 36.0–46.0)
Hemoglobin: 12.4 g/dL (ref 12.0–15.0)
Immature Granulocytes: 1 %
Lymphocytes Relative: 12 %
Lymphs Abs: 1 10*3/uL (ref 0.7–4.0)
MCH: 33.6 pg (ref 26.0–34.0)
MCHC: 33.5 g/dL (ref 30.0–36.0)
MCV: 100.3 fL — ABNORMAL HIGH (ref 80.0–100.0)
Monocytes Absolute: 0.6 10*3/uL (ref 0.1–1.0)
Monocytes Relative: 7 %
Neutro Abs: 6.8 10*3/uL (ref 1.7–7.7)
Neutrophils Relative %: 78 %
Platelets: 407 10*3/uL — ABNORMAL HIGH (ref 150–400)
RBC: 3.69 MIL/uL — ABNORMAL LOW (ref 3.87–5.11)
RDW: 12.9 % (ref 11.5–15.5)
WBC: 8.6 10*3/uL (ref 4.0–10.5)
nRBC: 0 % (ref 0.0–0.2)

## 2022-10-08 LAB — MAGNESIUM: Magnesium: 2.1 mg/dL (ref 1.7–2.4)

## 2022-10-08 MED ORDER — HEPARIN SOD (PORK) LOCK FLUSH 100 UNIT/ML IV SOLN
500.0000 [IU] | Freq: Once | INTRAVENOUS | Status: AC
  Administered 2022-10-08: 500 [IU] via INTRAVENOUS
  Filled 2022-10-08: qty 5

## 2022-10-08 MED ORDER — SODIUM CHLORIDE 0.9% FLUSH
10.0000 mL | INTRAVENOUS | Status: DC | PRN
  Administered 2022-10-08: 10 mL via INTRAVENOUS
  Filled 2022-10-08: qty 10

## 2022-10-08 MED FILL — Dexamethasone Sodium Phosphate Inj 100 MG/10ML: INTRAMUSCULAR | Qty: 1 | Status: AC

## 2022-10-08 MED FILL — Fosaprepitant Dimeglumine For IV Infusion 150 MG (Base Eq): INTRAVENOUS | Qty: 5 | Status: AC

## 2022-10-08 NOTE — Progress Notes (Signed)
Patient here for oncology follow-up appointment, concerns of nausea & diarrhea

## 2022-10-08 NOTE — Progress Notes (Signed)
[facility] CONSULT NOTE  Patient Care Team: Reubin Milan, MD as PCP - General (Internal Medicine) Glory Buff, RN as Oncology Nurse Navigator Michaelyn Barter, MD as Consulting Physician (Oncology)   CANCER STAGING   Cancer Staging  Cholangiocarcinoma [facility]) Staging form: Intrahepatic Bile Duct, AJCC 8th Edition - Clinical stage from 08/26/2022: Stage IV (cM1) - Signed by Michaelyn Barter, MD on 09/03/2022 Stage prefix: Initial diagnosis   ASSESSMENT & PLAN:  Vicki Phillips 57 y.o. 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  # Cholangiocarcinoma, stage IV - Patient reports she presented to ED with complaints of chest pain.  Had chest x-ray which showed pulmonary nodules was referred to pulmonary.  Records not available.  Was then seen by Dr. Aundria Rud on 08/05/2022. CT chest showed innumerable pulmonary nodules of varying sizes throughout the lung.  Largest spiculated appearing nodule of anterior right upper lobe measuring 2 x 2 cm, at least 1 ill-defined hypodense lesion of the anterior left lobe of the liver.   -PET/CT from 7-24 showed large tumor burden with numerous pulmonary nodules bilaterally.  Index lesion measuring 2 cm in the left upper lobe SUV 8.3, subpleural lateral left lower lobe measuring 1.5 cm SUV 10.2, posterior right base 2 cm SUV 5.5, left supraclavicular lymph node 8 mm SUV 4.5, numerous liver masses, dominant lesion 8 cm in segment 4B SUV 12.7 with additional lesions in segment 5 6 and 4a, multiple abdominal lymph nodes are identified.  Pancreas looks normal.  Tracer avid sclerotic lesion within T3 SUV 4.5.  - s/p liver biopsy on 08/26/2022 showed metastatic moderately differentiated adenocarcinoma.  IHC stain positive for CK7 and CK20.  TTF-1, PAX8, GATA3 and CDX2 are negative.  Differentials include cholangiocarcinoma and pancreatic carcinoma.  Pancreatic cancer is less  likely since it looks normal on the imaging.   -MRI brain with and without contrast done for headaches was negative for metastasis.  Plan- -patient sought second opinion with Dr. Hulen Luster at [facility] who agreed with the treatment plan.  Plan for follow-up in 3 months.  -Labs reviewed and acceptable for treatment.  She will come tomorrow for cycle 2-day 1 of cisplatin and gemcitabine.  Follow-up in 1 week.  Plan to repeat scans after 4 cycles.  -PD-L1 0%.  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  Full report below. -Pretreatment CA 19-9 352.  CA 19-9 trending down to 225.  # Hypertension -Continue with amlodipine 5 mg daily.  Blood pressure improved  # Cough -Continue with over-the-counter Robitussin DM.  She has mild to moderate improvement with Robitussin DM. -Discussed about Tessalon Perles and codeine syrup.  She wants to use as minimal medications as she can.  # Diarrhea -GI panel and C. difficile was negative -Continue with Imodium as needed.  # Weight loss # Poor appetite -Discussed about nutrition consult and appetite stimulant.  Would like to hold off  # History of sarcoidosis -Management per pulmonary -Never required treatment.  Orders Placed This Encounter  Procedures   CBC with Differential/Platelet    Standing Status:   Future    Number of Occurrences:   1    Standing Expiration Date:   10/08/2023   Comprehensive metabolic panel    Standing Status:   Future    Number of Occurrences:   1    Standing Expiration Date:   10/08/2023   Cancer antigen 19-9    Standing Status:  Future    Number of Occurrences:   1    Standing Expiration Date:   10/08/2023   Magnesium    Standing Status:   Future    Number of Occurrences:   1    Standing Expiration Date:   10/08/2023   RTC in 2 weeks for MD visit, labs, cycle 2-day 1 of cisplatin, gemcitabine and durvalumab.  The total time spent in the appointment was 30 minutes encounter with patients including review of  chart and various tests results, discussions about plan of care and coordination of care plan   All questions were answered. The patient knows to call the clinic with any problems, questions or concerns. No barriers to learning was detected.  Michaelyn Barter, MD 8/15/202411:46 AM   HISTORY OF PRESENTING ILLNESS:  Vicki Phillips 57 y.o. 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  Patient reports she presented to ED with complaints of chest pain.  Had chest x-ray which showed pulmonary nodules was referred to pulmonary.  Records not available.  Was then seen by Dr. Aundria Rud on 08/05/2022.  CT chest showed innumerable pulmonary nodules of varying sizes throughout the lung.  Largest spiculated appearing nodule of anterior right upper lobe measuring 2 x 2 cm, at least 1 ill-defined hypodense lesion of the anterior left lobe of the liver.  Despite stated history of sarcoidosis, there are no specific or characteristic features of sarcoidosis with respect to distribution, morphology, associated fibrotic change, nor associated mediastinal lymphadenopathy. 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  She has history of sarcoidosis but was never on treatment.  Reports that she does not see doctors on a regular basis because she is had bad experiences.  Interval history Patient seen today prior to cycle 2-day 1 of cisplatin and gemcitabine. Overall she is tolerating treatment well.  But this time around has been noticing fatigue just prior to initiation of treatment.  Has nausea and is using Decadron for 3 days.  Not using Zofran and Compazine and does not feel a strong need for it.  Her appetite is also poor wants to hold off on appetite stimulant.  She is trying to get  her calories in if not by solids then making smoothies.  Her cough had transiently improved but now is back.  She is using Robitussin DM at night to help her with sleep.  Blood pressure is better controlled with amlodipine.  Her diarrhea had also transiently improved but now is back and is using Imodium.  Creon enzymes did not help much.  I have reviewed her chart and materials related to her cancer extensively and collaborated history with the patient. Summary of oncologic history is as follows: Oncology History  Cholangiocarcinoma (HCC)  08/26/2022 Cancer Staging   Staging form: Intrahepatic Bile Duct, AJCC 8th Edition - Clinical stage from 08/26/2022: Stage IV (cM1) - Signed by Michaelyn Barter, MD on 09/03/2022 Stage prefix: Initial diagnosis   09/03/2022 Initial Diagnosis   Cholangiocarcinoma (HCC)   09/18/2022 -  Chemotherapy   Patient is on Treatment Plan : BILIARY TRACT Cisplatin + Gemcitabine D1,8 + Durvalumab (1500) D1 q21d / Durvalumab (1500) q28d     Metastasis to lung (HCC)  09/03/2022 Initial Diagnosis   Metastasis to lung (HCC)   09/18/2022 -  Chemotherapy   Patient is on  Treatment Plan : BILIARY TRACT Cisplatin + Gemcitabine D1,8 + Durvalumab (1500) D1 q21d / Durvalumab (1500) q28d       MEDICAL HISTORY:  Past Medical History:  Diagnosis Date   Allergy    Sarcoidosis 2011    SURGICAL HISTORY: Past Surgical History:  Procedure Laterality Date   CESAREAN SECTION     x 2   COLONOSCOPY WITH PROPOFOL N/A 08/10/2019   Procedure: COLONOSCOPY WITH PROPOFOL;  Surgeon: Midge Minium, MD;  Location: [facility];  Service: Endoscopy;  Laterality: N/A;  priority 4   IR IMAGING GUIDED PORT INSERTION  09/11/2022   LUNG BIOPSY      SOCIAL HISTORY: Social History   Socioeconomic History   Marital status: Single    Spouse name: Not on file   Number of children: 2   Years of education: Not on file   Highest education level: Not on file  Occupational History   Not on file   Tobacco Use   Smoking status: Never   Smokeless tobacco: Never  Vaping Use   Vaping status: Never Used  Substance and Sexual Activity   Alcohol use: Yes    Alcohol/week: 10.0 standard drinks of alcohol    Types: 10 Standard drinks or equivalent per week   Drug use: No   Sexual activity: Yes    Partners: Male    Birth control/protection: Surgical  Other Topics Concern   Not on file  Social History Narrative   Not on file   Social Determinants of Health   Financial Resource Strain: Low Risk  (09/18/2022)   Overall Financial Resource Strain (CARDIA)    Difficulty of Paying Living Expenses: Not hard at all  Food Insecurity: No Food Insecurity (08/24/2022)   Hunger Vital Sign    Worried About Running Out of Food in the Last Year: Never true    Ran Out of Food in the Last Year: Never true  Transportation Needs: No Transportation Needs (08/24/2022)   PRAPARE - Administrator, Civil Service (Medical): No    Lack of Transportation (Non-Medical): No  Physical Activity: Not on file  Stress: Not on file  Social Connections: Not on file  Intimate Partner Violence: Not At Risk (08/24/2022)   Humiliation, Afraid, Rape, and Kick questionnaire    Fear of Current or Ex-Partner: No    Emotionally Abused: No    Physically Abused: No    Sexually Abused: No    FAMILY HISTORY: Family History  Problem Relation Age of Onset   Stroke Mother    Heart disease Mother    Heart attack Father 85   Heart disease Father    Kidney disease Sister     ALLERGIES:  is allergic to other.  MEDICATIONS:  Current Outpatient Medications  Medication Sig Dispense Refill   amLODipine (NORVASC) 5 MG tablet Take 1 tablet (5 mg total) by mouth daily. 30 tablet 0   dexamethasone (DECADRON) 4 MG tablet Take 2 tablets (8 mg) by mouth daily x 3 days starting the day after cisplatin chemotherapy. Take with food. 30 tablet 1   lipase/protease/amylase (CREON) 36000 UNITS CPEP capsule Take 1 capsule (36,000  Units total) by mouth 3 (three) times daily with meals. May also take 1 capsule (36,000 Units total) as needed (with snacks). 240 capsule 1   ondansetron (ZOFRAN) 8 MG tablet Take 1 tablet (8 mg total) by mouth every 8 (eight) hours as needed for nausea or vomiting. Start on the third day after cisplatin. 30  tablet 1   prochlorperazine (COMPAZINE) 10 MG tablet Take 1 tablet (10 mg total) by mouth every 6 (six) hours as needed (Nausea or vomiting). 30 tablet 1   lidocaine-prilocaine (EMLA) cream Apply 1 Application topically as needed. 30 g 0   lidocaine-prilocaine (EMLA) cream Apply to affected area once 30 g 3   loratadine (CLARITIN) 10 MG tablet Take 10 mg by mouth daily.     pseudoephedrine-acetaminophen (TYLENOL SINUS) 30-500 MG TABS tablet Take 1 tablet by mouth in the morning and at bedtime.     No current facility-administered medications for this visit.   Facility-Administered Medications Ordered in Other Visits  Medication Dose Route Frequency Provider Last Rate Last Admin   sodium chloride flush (NS) 0.9 % injection 10 mL  10 mL Intravenous PRN Michaelyn Barter, MD   10 mL at 10/08/22 1015    REVIEW OF SYSTEMS:   Pertinent information mentioned in HPI All other systems were reviewed with the patient and are negative.  PHYSICAL EXAMINATION: ECOG PERFORMANCE STATUS: 1 - Symptomatic but completely ambulatory  Vitals:   10/08/22 1031  BP: (!) 137/100  Pulse: 92  Resp: 17  Temp: 97.7 F (36.5 C)  SpO2: 98%    Filed Weights   10/08/22 1031  Weight: 242 lb (109.8 kg)     GENERAL:alert, no distress and comfortable SKIN: skin color, texture, turgor are normal, no rashes or significant lesions EYES: normal, conjunctiva are pink and non-injected, sclera clear OROPHARYNX:no exudate, no erythema and lips, buccal mucosa, and tongue normal  NECK: supple, thyroid normal size, non-tender, without nodularity LYMPH:  no palpable lymphadenopathy in the cervical, axillary or  inguinal LUNGS: clear to auscultation and percussion with normal breathing effort HEART: regular rate & rhythm and no murmurs and no lower extremity edema ABDOMEN:abdomen soft, non-tender and normal bowel sounds Musculoskeletal:no cyanosis of digits and no clubbing  PSYCH: alert & oriented x 3 with fluent speech NEURO: no focal motor/sensory deficits  LABORATORY DATA:  I have reviewed the data as listed Lab Results  Component Value Date   WBC 8.6 10/08/2022   HGB 12.4 10/08/2022   HCT 37.0 10/08/2022   MCV 100.3 (H) 10/08/2022   PLT 407 (H) 10/08/2022   Recent Labs    09/24/22 0847 10/01/22 1004 10/08/22 1012  NA 136 135 137  K 3.7 4.1 3.9  CL 103 104 108  CO2 23 23 22   GLUCOSE 118* 104* 101*  BUN 13 10 14   CREATININE 0.71 0.73 0.73  CALCIUM 9.4 9.1 9.0  GFRNONAA >60 >60 >60  PROT 7.5 7.2 7.0  ALBUMIN 3.8 4.2 3.9  AST 54* 23 26  ALT 103* 37 27  ALKPHOS 115 148* 116  BILITOT 0.5 0.3 0.2*    RADIOGRAPHIC STUDIES: I have personally reviewed the radiological images as listed and agreed with the findings in the report. IR IMAGING GUIDED PORT INSERTION  Result Date: 09/11/2022 INDICATION: Chemotherapy EXAM: Chest port placement using ultrasound and fluoroscopic guidance MEDICATIONS: Documented in the EMR ANESTHESIA/SEDATION: Moderate (conscious) sedation was employed during this procedure. A total of Versed 2 mg and Fentanyl 100 mcg was administered intravenously. Moderate Sedation Time: 30 minutes. The patient's level of consciousness and vital signs were monitored continuously by radiology nursing throughout the procedure under my direct supervision. FLUOROSCOPY TIME:  Fluoroscopy Time: 0.4 minutes (1 mGy) COMPLICATIONS: None immediate. PROCEDURE: Informed written consent was obtained from the patient after a thorough discussion of the procedural risks, benefits and alternatives. All questions were addressed. Maximal  Sterile Barrier Technique was utilized including caps, mask,  sterile gowns, sterile gloves, sterile drape, hand hygiene and skin antiseptic. A timeout was performed prior to the initiation of the procedure. The patient was placed supine on the exam table. The right neck and chest was prepped and draped in the standard sterile fashion. A preliminary ultrasound of the right neck was performed and demonstrates a patent right internal jugular vein. A permanent ultrasound image was stored in the electronic medical record. The overlying skin was anesthetized with 1% Lidocaine. Using ultrasound guidance, access was obtained into the right internal jugular vein using a 21 gauge micropuncture set. A wire was advanced into the SVC, a short incision was made at the puncture site, and serial dilatation performed. Next, in an ipsilateral infraclavicular location, an incision was made at the site of the subcutaneous reservoir. Blunt dissection was used to open a pocket to contain the reservoir. A subcutaneous tunnel was then created from the port site to the puncture site. A(n) 8 Fr single lumen catheter was advanced through the tunnel. The catheter was attached to the port and this was placed in the subcutaneous pocket. Under fluoroscopic guidance, a peel away sheath was placed, and the catheter was trimmed to the appropriate length and was advanced into the central veins. The catheter length is 24 cm. The tip of the catheter lies near the superior cavoatrial junction. The port flushes and aspirates appropriately. The port was flushed and locked with heparinized saline. The port pocket was closed in 2 layers using 3-0 and 4-0 Vicryl/absorbable suture. Dermabond was also applied to both incisions. The patient tolerated the procedure well and was transferred to recovery in stable condition. IMPRESSION: Successful placement of a right-sided chest port via the right internal jugular vein. The port is ready for immediate use. Electronically Signed   By: Olive Bass M.D.   On: 09/11/2022  15:52

## 2022-10-09 ENCOUNTER — Encounter: Payer: Self-pay | Admitting: *Deleted

## 2022-10-09 ENCOUNTER — Inpatient Hospital Stay: Payer: BC Managed Care – PPO

## 2022-10-09 VITALS — BP 125/86 | HR 94 | Temp 97.6°F | Wt 243.2 lb

## 2022-10-09 DIAGNOSIS — C7801 Secondary malignant neoplasm of right lung: Secondary | ICD-10-CM

## 2022-10-09 DIAGNOSIS — I119 Hypertensive heart disease without heart failure: Secondary | ICD-10-CM | POA: Diagnosis not present

## 2022-10-09 DIAGNOSIS — C221 Intrahepatic bile duct carcinoma: Secondary | ICD-10-CM

## 2022-10-09 DIAGNOSIS — C7800 Secondary malignant neoplasm of unspecified lung: Secondary | ICD-10-CM | POA: Diagnosis not present

## 2022-10-09 DIAGNOSIS — Z79899 Other long term (current) drug therapy: Secondary | ICD-10-CM | POA: Diagnosis not present

## 2022-10-09 DIAGNOSIS — R978 Other abnormal tumor markers: Secondary | ICD-10-CM | POA: Diagnosis not present

## 2022-10-09 DIAGNOSIS — Z5111 Encounter for antineoplastic chemotherapy: Secondary | ICD-10-CM | POA: Diagnosis not present

## 2022-10-09 DIAGNOSIS — D869 Sarcoidosis, unspecified: Secondary | ICD-10-CM | POA: Diagnosis not present

## 2022-10-09 LAB — CANCER ANTIGEN 19-9: CA 19-9: 213 U/mL — ABNORMAL HIGH (ref 0–35)

## 2022-10-09 MED ORDER — POTASSIUM CHLORIDE IN NACL 20-0.9 MEQ/L-% IV SOLN
Freq: Once | INTRAVENOUS | Status: AC
  Filled 2022-10-09: qty 1000

## 2022-10-09 MED ORDER — SODIUM CHLORIDE 0.9 % IV SOLN
1500.0000 mg | Freq: Once | INTRAVENOUS | Status: AC
  Administered 2022-10-09: 1500 mg via INTRAVENOUS
  Filled 2022-10-09: qty 30

## 2022-10-09 MED ORDER — SODIUM CHLORIDE 0.9 % IV SOLN
Freq: Once | INTRAVENOUS | Status: AC
  Filled 2022-10-09: qty 250

## 2022-10-09 MED ORDER — HEPARIN SOD (PORK) LOCK FLUSH 100 UNIT/ML IV SOLN
500.0000 [IU] | Freq: Once | INTRAVENOUS | Status: AC | PRN
  Administered 2022-10-09: 500 [IU]
  Filled 2022-10-09: qty 5

## 2022-10-09 MED ORDER — MAGNESIUM SULFATE 2 GM/50ML IV SOLN
2.0000 g | Freq: Once | INTRAVENOUS | Status: AC
  Administered 2022-10-09: 2 g via INTRAVENOUS
  Filled 2022-10-09: qty 50

## 2022-10-09 MED ORDER — SODIUM CHLORIDE 0.9 % IV SOLN
150.0000 mg | Freq: Once | INTRAVENOUS | Status: AC
  Administered 2022-10-09: 150 mg via INTRAVENOUS
  Filled 2022-10-09: qty 150

## 2022-10-09 MED ORDER — SODIUM CHLORIDE 0.9 % IV SOLN
1000.0000 mg/m2 | Freq: Once | INTRAVENOUS | Status: AC
  Administered 2022-10-09: 2318 mg via INTRAVENOUS
  Filled 2022-10-09: qty 52.57

## 2022-10-09 MED ORDER — SODIUM CHLORIDE 0.9% FLUSH
10.0000 mL | INTRAVENOUS | Status: DC | PRN
  Administered 2022-10-09: 10 mL
  Filled 2022-10-09: qty 10

## 2022-10-09 MED ORDER — PALONOSETRON HCL INJECTION 0.25 MG/5ML
0.2500 mg | Freq: Once | INTRAVENOUS | Status: AC
  Administered 2022-10-09: 0.25 mg via INTRAVENOUS
  Filled 2022-10-09: qty 5

## 2022-10-09 MED ORDER — SODIUM CHLORIDE 0.9 % IV SOLN
25.0000 mg/m2 | Freq: Once | INTRAVENOUS | Status: AC
  Administered 2022-10-09: 58 mg via INTRAVENOUS
  Filled 2022-10-09: qty 58

## 2022-10-09 MED ORDER — SODIUM CHLORIDE 0.9 % IV SOLN
10.0000 mg | Freq: Once | INTRAVENOUS | Status: AC
  Administered 2022-10-09: 10 mg via INTRAVENOUS
  Filled 2022-10-09: qty 10

## 2022-10-09 NOTE — Progress Notes (Signed)
Met with patient during infusion. Pt did not have any questions or needs during visit. Instructed to call with any future questions or needs. Pt verbalized understanding.

## 2022-10-09 NOTE — Progress Notes (Signed)
Ok'd per MD to run post fluids with chemo

## 2022-10-12 ENCOUNTER — Encounter: Payer: Self-pay | Admitting: Internal Medicine

## 2022-10-15 ENCOUNTER — Inpatient Hospital Stay: Payer: BC Managed Care – PPO

## 2022-10-15 ENCOUNTER — Ambulatory Visit: Payer: BC Managed Care – PPO

## 2022-10-15 ENCOUNTER — Other Ambulatory Visit: Payer: Self-pay | Admitting: Internal Medicine

## 2022-10-15 ENCOUNTER — Inpatient Hospital Stay (HOSPITAL_BASED_OUTPATIENT_CLINIC_OR_DEPARTMENT_OTHER): Payer: BC Managed Care – PPO | Admitting: Internal Medicine

## 2022-10-15 ENCOUNTER — Encounter: Payer: Self-pay | Admitting: Internal Medicine

## 2022-10-15 VITALS — BP 148/96 | HR 93 | Temp 97.7°F | Resp 18 | Wt 243.5 lb

## 2022-10-15 DIAGNOSIS — Z5111 Encounter for antineoplastic chemotherapy: Secondary | ICD-10-CM

## 2022-10-15 DIAGNOSIS — C7801 Secondary malignant neoplasm of right lung: Secondary | ICD-10-CM | POA: Diagnosis not present

## 2022-10-15 DIAGNOSIS — C7802 Secondary malignant neoplasm of left lung: Secondary | ICD-10-CM | POA: Diagnosis not present

## 2022-10-15 DIAGNOSIS — Z79899 Other long term (current) drug therapy: Secondary | ICD-10-CM | POA: Diagnosis not present

## 2022-10-15 DIAGNOSIS — C7800 Secondary malignant neoplasm of unspecified lung: Secondary | ICD-10-CM | POA: Diagnosis not present

## 2022-10-15 DIAGNOSIS — C221 Intrahepatic bile duct carcinoma: Secondary | ICD-10-CM

## 2022-10-15 DIAGNOSIS — D869 Sarcoidosis, unspecified: Secondary | ICD-10-CM | POA: Diagnosis not present

## 2022-10-15 DIAGNOSIS — R978 Other abnormal tumor markers: Secondary | ICD-10-CM | POA: Diagnosis not present

## 2022-10-15 DIAGNOSIS — I119 Hypertensive heart disease without heart failure: Secondary | ICD-10-CM | POA: Diagnosis not present

## 2022-10-15 DIAGNOSIS — Z95828 Presence of other vascular implants and grafts: Secondary | ICD-10-CM

## 2022-10-15 DIAGNOSIS — Z862 Personal history of diseases of the blood and blood-forming organs and certain disorders involving the immune mechanism: Secondary | ICD-10-CM

## 2022-10-15 LAB — CBC WITH DIFFERENTIAL (CANCER CENTER ONLY)
Abs Immature Granulocytes: 0.01 10*3/uL (ref 0.00–0.07)
Basophils Absolute: 0 10*3/uL (ref 0.0–0.1)
Basophils Relative: 1 %
Eosinophils Absolute: 0 10*3/uL (ref 0.0–0.5)
Eosinophils Relative: 1 %
HCT: 34.8 % — ABNORMAL LOW (ref 36.0–46.0)
Hemoglobin: 11.8 g/dL — ABNORMAL LOW (ref 12.0–15.0)
Immature Granulocytes: 0 %
Lymphocytes Relative: 25 %
Lymphs Abs: 1 10*3/uL (ref 0.7–4.0)
MCH: 33.7 pg (ref 26.0–34.0)
MCHC: 33.9 g/dL (ref 30.0–36.0)
MCV: 99.4 fL (ref 80.0–100.0)
Monocytes Absolute: 0.3 10*3/uL (ref 0.1–1.0)
Monocytes Relative: 7 %
Neutro Abs: 2.6 10*3/uL (ref 1.7–7.7)
Neutrophils Relative %: 66 %
Platelet Count: 366 10*3/uL (ref 150–400)
RBC: 3.5 MIL/uL — ABNORMAL LOW (ref 3.87–5.11)
RDW: 12.9 % (ref 11.5–15.5)
WBC Count: 4 10*3/uL (ref 4.0–10.5)
nRBC: 0 % (ref 0.0–0.2)

## 2022-10-15 LAB — COMPREHENSIVE METABOLIC PANEL
ALT: 66 U/L — ABNORMAL HIGH (ref 0–44)
AST: 43 U/L — ABNORMAL HIGH (ref 15–41)
Albumin: 3.9 g/dL (ref 3.5–5.0)
Alkaline Phosphatase: 101 U/L (ref 38–126)
Anion gap: 8 (ref 5–15)
BUN: 15 mg/dL (ref 6–20)
CO2: 23 mmol/L (ref 22–32)
Calcium: 9.1 mg/dL (ref 8.9–10.3)
Chloride: 105 mmol/L (ref 98–111)
Creatinine, Ser: 0.7 mg/dL (ref 0.44–1.00)
GFR, Estimated: >60 mL/min (ref >60)
Glucose, Bld: 104 mg/dL — ABNORMAL HIGH (ref 70–99)
Potassium: 3.9 mmol/L (ref 3.5–5.1)
Sodium: 136 mmol/L (ref 135–145)
Total Bilirubin: 0.5 mg/dL (ref 0.3–1.2)
Total Protein: 7 g/dL (ref 6.5–8.1)

## 2022-10-15 LAB — CMP (CANCER CENTER ONLY)
ALT: 65 U/L — ABNORMAL HIGH (ref 0–44)
AST: 41 U/L (ref 15–41)
Albumin: 3.8 g/dL (ref 3.5–5.0)
Alkaline Phosphatase: 99 U/L (ref 38–126)
Anion gap: 7 (ref 5–15)
BUN: 14 mg/dL (ref 6–20)
CO2: 23 mmol/L (ref 22–32)
Calcium: 9 mg/dL (ref 8.9–10.3)
Chloride: 105 mmol/L (ref 98–111)
Creatinine: 0.74 mg/dL (ref 0.44–1.00)
GFR, Estimated: >60 mL/min (ref >60)
Glucose, Bld: 103 mg/dL — ABNORMAL HIGH (ref 70–99)
Potassium: 3.8 mmol/L (ref 3.5–5.1)
Sodium: 135 mmol/L (ref 135–145)
Total Bilirubin: 0.6 mg/dL (ref 0.3–1.2)
Total Protein: 6.8 g/dL (ref 6.5–8.1)

## 2022-10-15 LAB — MAGNESIUM: Magnesium: 2 mg/dL (ref 1.7–2.4)

## 2022-10-15 MED ORDER — SODIUM CHLORIDE 0.9% FLUSH
10.0000 mL | Freq: Once | INTRAVENOUS | Status: AC
  Administered 2022-10-15: 10 mL via INTRAVENOUS
  Filled 2022-10-15: qty 10

## 2022-10-15 MED ORDER — HEPARIN SOD (PORK) LOCK FLUSH 100 UNIT/ML IV SOLN
500.0000 [IU] | Freq: Once | INTRAVENOUS | Status: AC
  Administered 2022-10-15: 500 [IU] via INTRAVENOUS
  Filled 2022-10-15: qty 5

## 2022-10-15 MED FILL — Fosaprepitant Dimeglumine For IV Infusion 150 MG (Base Eq): INTRAVENOUS | Qty: 5 | Status: AC

## 2022-10-15 MED FILL — Dexamethasone Sodium Phosphate Inj 100 MG/10ML: INTRAMUSCULAR | Qty: 1 | Status: AC

## 2022-10-15 NOTE — Progress Notes (Signed)
Patient would like refill for amlodipine.

## 2022-10-16 ENCOUNTER — Inpatient Hospital Stay: Payer: BC Managed Care – PPO

## 2022-10-16 ENCOUNTER — Encounter: Payer: Self-pay | Admitting: Internal Medicine

## 2022-10-16 ENCOUNTER — Other Ambulatory Visit: Payer: Self-pay

## 2022-10-16 VITALS — BP 136/83 | HR 97 | Temp 96.9°F | Resp 16

## 2022-10-16 DIAGNOSIS — I119 Hypertensive heart disease without heart failure: Secondary | ICD-10-CM | POA: Diagnosis not present

## 2022-10-16 DIAGNOSIS — C7802 Secondary malignant neoplasm of left lung: Secondary | ICD-10-CM

## 2022-10-16 DIAGNOSIS — D869 Sarcoidosis, unspecified: Secondary | ICD-10-CM | POA: Diagnosis not present

## 2022-10-16 DIAGNOSIS — C221 Intrahepatic bile duct carcinoma: Secondary | ICD-10-CM

## 2022-10-16 DIAGNOSIS — Z5111 Encounter for antineoplastic chemotherapy: Secondary | ICD-10-CM | POA: Diagnosis not present

## 2022-10-16 DIAGNOSIS — C7800 Secondary malignant neoplasm of unspecified lung: Secondary | ICD-10-CM | POA: Diagnosis not present

## 2022-10-16 DIAGNOSIS — R978 Other abnormal tumor markers: Secondary | ICD-10-CM | POA: Diagnosis not present

## 2022-10-16 DIAGNOSIS — Z79899 Other long term (current) drug therapy: Secondary | ICD-10-CM | POA: Diagnosis not present

## 2022-10-16 MED ORDER — SODIUM CHLORIDE 0.9 % IV SOLN
1000.0000 mg/m2 | Freq: Once | INTRAVENOUS | Status: AC
  Administered 2022-10-16: 2318 mg via INTRAVENOUS
  Filled 2022-10-16: qty 60.96

## 2022-10-16 MED ORDER — SODIUM CHLORIDE 0.9 % IV SOLN
10.0000 mg | Freq: Once | INTRAVENOUS | Status: AC
  Administered 2022-10-16: 10 mg via INTRAVENOUS
  Filled 2022-10-16: qty 10

## 2022-10-16 MED ORDER — MAGNESIUM SULFATE 2 GM/50ML IV SOLN
2.0000 g | Freq: Once | INTRAVENOUS | Status: AC
  Administered 2022-10-16: 2 g via INTRAVENOUS
  Filled 2022-10-16: qty 50

## 2022-10-16 MED ORDER — SODIUM CHLORIDE 0.9% FLUSH
10.0000 mL | INTRAVENOUS | Status: DC | PRN
  Administered 2022-10-16: 10 mL
  Filled 2022-10-16: qty 10

## 2022-10-16 MED ORDER — SODIUM CHLORIDE 0.9 % IV SOLN
Freq: Once | INTRAVENOUS | Status: AC
  Filled 2022-10-16: qty 250

## 2022-10-16 MED ORDER — POTASSIUM CHLORIDE IN NACL 20-0.9 MEQ/L-% IV SOLN
Freq: Once | INTRAVENOUS | Status: AC
  Filled 2022-10-16: qty 1000

## 2022-10-16 MED ORDER — HEPARIN SOD (PORK) LOCK FLUSH 100 UNIT/ML IV SOLN
500.0000 [IU] | Freq: Once | INTRAVENOUS | Status: AC | PRN
  Administered 2022-10-16: 500 [IU]
  Filled 2022-10-16: qty 5

## 2022-10-16 MED ORDER — SODIUM CHLORIDE 0.9 % IV SOLN
150.0000 mg | Freq: Once | INTRAVENOUS | Status: AC
  Administered 2022-10-16: 150 mg via INTRAVENOUS
  Filled 2022-10-16: qty 150

## 2022-10-16 MED ORDER — PALONOSETRON HCL INJECTION 0.25 MG/5ML
0.2500 mg | Freq: Once | INTRAVENOUS | Status: AC
  Administered 2022-10-16: 0.25 mg via INTRAVENOUS
  Filled 2022-10-16: qty 5

## 2022-10-16 MED ORDER — SODIUM CHLORIDE 0.9 % IV SOLN
25.0000 mg/m2 | Freq: Once | INTRAVENOUS | Status: AC
  Administered 2022-10-16: 58 mg via INTRAVENOUS
  Filled 2022-10-16: qty 58

## 2022-10-16 NOTE — Progress Notes (Signed)
[facility] CONSULT NOTE  Patient Care Team: Reubin Milan, MD as PCP - General (Internal Medicine) Glory Buff, RN as Oncology Nurse Navigator Michaelyn Barter, MD as Consulting Physician (Oncology)   CANCER STAGING   Cancer Staging  Cholangiocarcinoma [facility]) Staging form: Intrahepatic Bile Duct, AJCC 8th Edition - Clinical stage from 08/26/2022: Stage IV (cM1) - Signed by Michaelyn Barter, MD on 09/03/2022 Stage prefix: Initial diagnosis   ASSESSMENT & PLAN:  Vicki Phillips 57 y.o. 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  # Cholangiocarcinoma, stage IV - Patient reports she presented to ED with complaints of chest pain.  Had chest x-ray which showed pulmonary nodules was referred to pulmonary.  Records not available.  Was then seen by Dr. Aundria Rud on 08/05/2022. CT chest showed innumerable pulmonary nodules of varying sizes throughout the lung.  Largest spiculated appearing nodule of anterior right upper lobe measuring 2 x 2 cm, at least 1 ill-defined hypodense lesion of the anterior left lobe of the liver.   -PET/CT from 7-24 showed large tumor burden with numerous pulmonary nodules bilaterally.  Index lesion measuring 2 cm in the left upper lobe SUV 8.3, subpleural lateral left lower lobe measuring 1.5 cm SUV 10.2, posterior right base 2 cm SUV 5.5, left supraclavicular lymph node 8 mm SUV 4.5, numerous liver masses, dominant lesion 8 cm in segment 4B SUV 12.7 with additional lesions in segment 5 6 and 4a, multiple abdominal lymph nodes are identified.  Pancreas looks normal.  Tracer avid sclerotic lesion within T3 SUV 4.5.  - s/p liver biopsy on 08/26/2022 showed metastatic moderately differentiated adenocarcinoma.  IHC stain positive for CK7 and CK20.  TTF-1, PAX8, GATA3 and CDX2 are negative.  Differentials include cholangiocarcinoma and pancreatic carcinoma.  Pancreatic cancer is less  likely since it looks normal on the imaging.   -MRI brain with and without contrast done for headaches was negative for metastasis.  Plan- -patient sought second opinion with Dr. Hulen Luster at [facility] who agreed with the treatment plan.  Plan for follow-up in 3 months.  -Labs reviewed and acceptable for treatment.  Will proceed with cycle 2-day 8 of cisplatin 25 mg/m and gemcitabine 1000 mg/m.  Plan to do CT imaging after 4 cycles.  -PD-L1 0%.  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  Full report below. -Pretreatment CA 19-9 352.  CA 19-9 trending down to 225.  Will continue to monitor CA 19-9.  # Hypertension -Continue with amlodipine 5 mg daily.  Blood pressure improved  # Cough -Continue with over-the-counter Robitussin DM.  She has mild to moderate improvement with Robitussin DM. -Discussed about Tessalon Perles and codeine syrup.  She wants to use as minimal medications as she can.  # Diarrhea -GI panel and C. difficile was negative -Continue with Imodium as needed.  # Weight loss # Poor appetite -Discussed about nutrition consult and appetite stimulant.  Would like to hold off  # History of sarcoidosis -Management per pulmonary -Never required treatment.  Orders Placed This Encounter  Procedures   CBC with Differential (Cancer Center Only)    Standing Status:   Future    Standing Expiration Date:   10/29/2023   CMP (Cancer Center only)    Standing Status:   Future    Standing Expiration Date:   10/29/2023   T4    Standing Status:   Future    Standing Expiration Date:   10/29/2023  TSH    Standing Status:   Future    Standing Expiration Date:   10/29/2023   Magnesium    Standing Status:   Future    Standing Expiration Date:   10/29/2023   CBC with Differential (Cancer Center Only)    Standing Status:   Future    Standing Expiration Date:   11/05/2023   CMP (Cancer Center only)    Standing Status:   Future    Standing Expiration Date:   11/05/2023   Magnesium     Standing Status:   Future    Standing Expiration Date:   11/05/2023   RTC in 2 weeks for APP visit, labs, cycle 3-day 1 of cisplatin, gemcitabine and durvalumab. RTC in 3 weeks for MD visit, labs, cycle 3-day 8 of cisplatin and gemcitabine  The total time spent in the appointment was 30 minutes encounter with patients including review of chart and various tests results, discussions about plan of care and coordination of care plan   All questions were answered. The patient knows to call the clinic with any problems, questions or concerns. No barriers to learning was detected.  Michaelyn Barter, MD 8/23/202410:56 AM   HISTORY OF PRESENTING ILLNESS:  Vicki Phillips 57 y.o. 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  Patient reports she presented to ED with complaints of chest pain.  Had chest x-ray which showed pulmonary nodules was referred to pulmonary.  Records not available.  Was then seen by Dr. Aundria Rud on 08/05/2022.  CT chest showed innumerable pulmonary nodules of varying sizes throughout the lung.  Largest spiculated appearing nodule of anterior right upper lobe measuring 2 x 2 cm, at least 1 ill-defined hypodense lesion of the anterior left lobe of the liver.  Despite stated history of sarcoidosis, there are no specific or characteristic features of sarcoidosis with respect to distribution, morphology, associated fibrotic change, nor associated mediastinal lymphadenopathy. 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  She has history of sarcoidosis but was never on treatment.  Reports that she does not see doctors on a regular basis because she is had bad experiences.  Interval history Patient seen today prior to cycle 2-day 8 of cisplatin and  gemcitabine. Overall she is tolerating treatment well.  She feels good while she is taking her steroids.  Once the steroid effects weans off, she does notice fatigue.  Denies any nausea and vomiting.  Having cough and using Robitussin DM at night which helps with the sleep.  Bowel movements fluctuate.  With treatment she becomes constipated and then has diarrhea.  Appetite is fair.  Energy level is okay.  I have reviewed her chart and materials related to her cancer extensively and collaborated history with the patient. Summary of oncologic history is as follows: Oncology History  Cholangiocarcinoma (HCC)  08/26/2022 Cancer Staging   Staging form: Intrahepatic Bile Duct, AJCC 8th Edition - Clinical stage from 08/26/2022: Stage IV (cM1) - Signed by Michaelyn Barter, MD on 09/03/2022 Stage prefix: Initial diagnosis   09/03/2022 Initial Diagnosis   Cholangiocarcinoma (HCC)   09/18/2022 -  Chemotherapy   Patient is on Treatment Plan : BILIARY TRACT Cisplatin + Gemcitabine D1,8 + Durvalumab (1500) D1 q21d / Durvalumab (1500) q28d     Metastasis to lung (HCC)  09/03/2022 Initial Diagnosis  Metastasis to lung (HCC)   09/18/2022 -  Chemotherapy   Patient is on Treatment Plan : BILIARY TRACT Cisplatin + Gemcitabine D1,8 + Durvalumab (1500) D1 q21d / Durvalumab (1500) q28d       MEDICAL HISTORY:  Past Medical History:  Diagnosis Date   Allergy    Sarcoidosis 2011    SURGICAL HISTORY: Past Surgical History:  Procedure Laterality Date   CESAREAN SECTION     x 2   COLONOSCOPY WITH PROPOFOL N/A 08/10/2019   Procedure: COLONOSCOPY WITH PROPOFOL;  Surgeon: Midge Minium, MD;  Location[facility];  Service: Endoscopy;  Laterality: N/A;  priority 4   IR IMAGING GUIDED PORT INSERTION  09/11/2022   LUNG BIOPSY      SOCIAL HISTORY: Social History   Socioeconomic History   Marital status: Single    Spouse name: Not on file   Number of children: 2   Years of education: Not on file    Highest education level: Not on file  Occupational History   Not on file  Tobacco Use   Smoking status: Never   Smokeless tobacco: Never  Vaping Use   Vaping status: Never Used  Substance and Sexual Activity   Alcohol use: Yes    Alcohol/week: 10.0 standard drinks of alcohol    Types: 10 Standard drinks or equivalent per week   Drug use: No   Sexual activity: Yes    Partners: Male    Birth control/protection: Surgical  Other Topics Concern   Not on file  Social History Narrative   Not on file   Social Determinants of Health   Financial Resource Strain: Low Risk  (09/18/2022)   Overall Financial Resource Strain (CARDIA)    Difficulty of Paying Living Expenses: Not hard at all  Food Insecurity: No Food Insecurity (08/24/2022)   Hunger Vital Sign    Worried About Running Out of Food in the Last Year: Never true    Ran Out of Food in the Last Year: Never true  Transportation Needs: No Transportation Needs (08/24/2022)   PRAPARE - Administrator, Civil Service (Medical): No    Lack of Transportation (Non-Medical): No  Physical Activity: Not on file  Stress: Not on file  Social Connections: Not on file  Intimate Partner Violence: Not At Risk (08/24/2022)   Humiliation, Afraid, Rape, and Kick questionnaire    Fear of Current or Ex-Partner: No    Emotionally Abused: No    Physically Abused: No    Sexually Abused: No    FAMILY HISTORY: Family History  Problem Relation Age of Onset   Stroke Mother    Heart disease Mother    Heart attack Father 56   Heart disease Father    Kidney disease Sister     ALLERGIES:  is allergic to other.  MEDICATIONS:  Current Outpatient Medications  Medication Sig Dispense Refill   amLODipine (NORVASC) 5 MG tablet TAKE 1 TABLET(5 MG) BY MOUTH DAILY 90 tablet 0   dexamethasone (DECADRON) 4 MG tablet Take 2 tablets (8 mg) by mouth daily x 3 days starting the day after cisplatin chemotherapy. Take with food. 30 tablet 1   ondansetron  (ZOFRAN) 8 MG tablet Take 1 tablet (8 mg total) by mouth every 8 (eight) hours as needed for nausea or vomiting. Start on the third day after cisplatin. 30 tablet 1   prochlorperazine (COMPAZINE) 10 MG tablet Take 1 tablet (10 mg total) by mouth every 6 (six) hours as needed (Nausea  or vomiting). 30 tablet 1   lidocaine-prilocaine (EMLA) cream Apply 1 Application topically as needed. 30 g 0   lidocaine-prilocaine (EMLA) cream Apply to affected area once 30 g 3   lipase/protease/amylase (CREON) 36000 UNITS CPEP capsule Take 1 capsule (36,000 Units total) by mouth 3 (three) times daily with meals. May also take 1 capsule (36,000 Units total) as needed (with snacks). (Patient not taking: Reported on 10/15/2022) 240 capsule 1   loratadine (CLARITIN) 10 MG tablet Take 10 mg by mouth daily.     pseudoephedrine-acetaminophen (TYLENOL SINUS) 30-500 MG TABS tablet Take 1 tablet by mouth in the morning and at bedtime.     No current facility-administered medications for this visit.   Facility-Administered Medications Ordered in Other Visits  Medication Dose Route Frequency Provider Last Rate Last Admin   CISplatin (PLATINOL) 58 mg in sodium chloride 0.9 % 250 mL chemo infusion  25 mg/m2 (Treatment Plan Recorded) Intravenous Once Michaelyn Barter, MD       dexamethasone (DECADRON) 10 mg in sodium chloride 0.9 % 50 mL IVPB  10 mg Intravenous Once Michaelyn Barter, MD       fosaprepitant (EMEND) 150 mg in sodium chloride 0.9 % 145 mL IVPB  150 mg Intravenous Once Michaelyn Barter, MD       gemcitabine (GEMZAR) 2,318 mg in sodium chloride 0.9 % 250 mL chemo infusion  1,000 mg/m2 (Treatment Plan Recorded) Intravenous Once Michaelyn Barter, MD       heparin lock flush 100 unit/mL  500 Units Intracatheter Once PRN Michaelyn Barter, MD       palonosetron (ALOXI) injection 0.25 mg  0.25 mg Intravenous Once Michaelyn Barter, MD       sodium chloride flush (NS) 0.9 % injection 10 mL  10 mL Intracatheter PRN Michaelyn Barter,  MD        REVIEW OF SYSTEMS:   Pertinent information mentioned in HPI All other systems were reviewed with the patient and are negative.  PHYSICAL EXAMINATION: ECOG PERFORMANCE STATUS: 1 - Symptomatic but completely ambulatory  Vitals:   10/15/22 1038  BP: (!) 148/96  Pulse: 93  Resp: 18  Temp: 97.7 F (36.5 C)  SpO2: 100%    Filed Weights   10/15/22 1038  Weight: 243 lb 8 oz (110.5 kg)     GENERAL:alert, no distress and comfortable SKIN: skin color, texture, turgor are normal, no rashes or significant lesions EYES: normal, conjunctiva are pink and non-injected, sclera clear OROPHARYNX:no exudate, no erythema and lips, buccal mucosa, and tongue normal  NECK: supple, thyroid normal size, non-tender, without nodularity LYMPH:  no palpable lymphadenopathy in the cervical, axillary or inguinal LUNGS: clear to auscultation and percussion with normal breathing effort HEART: regular rate & rhythm and no murmurs and no lower extremity edema ABDOMEN:abdomen soft, non-tender and normal bowel sounds Musculoskeletal:no cyanosis of digits and no clubbing  PSYCH: alert & oriented x 3 with fluent speech NEURO: no focal motor/sensory deficits  LABORATORY DATA:  I have reviewed the data as listed Lab Results  Component Value Date   WBC 4.0 10/15/2022   HGB 11.8 (L) 10/15/2022   HCT 34.8 (L) 10/15/2022   MCV 99.4 10/15/2022   PLT 366 10/15/2022   Recent Labs    10/01/22 1004 10/08/22 1012 10/15/22 1018  NA 135 137 136  135  K 4.1 3.9 3.9  3.8  CL 104 108 105  105  CO2 23 22 23  23   GLUCOSE 104* 101* 104*  103*  BUN 10  14 15  14   CREATININE 0.73 0.73 0.74  0.70  CALCIUM 9.1 9.0 9.1  9.0  GFRNONAA >60 >60 >60  >60  PROT 7.2 7.0 7.0  6.8  ALBUMIN 4.2 3.9 3.9  3.8  AST 23 26 43*  41  ALT 37 27 66*  65*  ALKPHOS 148* 116 101  99  BILITOT 0.3 0.2* 0.5  0.6    RADIOGRAPHIC STUDIES: I have personally reviewed the radiological images as listed and agreed  with the findings in the report. No results found.

## 2022-10-16 NOTE — Progress Notes (Signed)
Per MD, OK to resume post hydration with chemo.

## 2022-10-16 NOTE — Progress Notes (Signed)
Nutrition Follow-up:  Patient with stage IV cholangiocarcinoma.  Patient receiving cisplatin, gemcitabine, durvalumab.    Met with patient during infusion.  Reports that she is feeling great on steroids. Reports no nausea.  Appetite has improved.  Usually has frittata (eggs, veggies, ham) for breakfast.  Lunch is soup with vegetables.  Dinner is meat (chicken, vegetables).  Loves chocolate/candy.  Has stopped alcohol.  Drinking water and diet coke.  Has started walking and wants to start resistance training.       Medications: reviewed  Labs: reviewed  Anthropometrics:   Weight 243 lb on 8/22 247 lb on 7/25   NUTRITION DIAGNOSIS: Inadequate oral intake improving    INTERVENTION:  Encouraged good sources of protein at every meal. Continue to include plant foods in diet Continue exercise as able    MONITORING, EVALUATION, GOAL: weight trends, intake   NEXT VISIT: as needed with treatment  Vicki Phillips, RD, LDN Registered Dietitian [phone]

## 2022-10-16 NOTE — Patient Instructions (Signed)
[facility] [place] [facility]  Discharge Instructions: Thank you for choosing [facility] to provide your oncology and hematology care.  If you have a lab appointment with the Cancer Center, please go directly to the Cancer Center and check in at the registration area.  Wear comfortable clothing and clothing appropriate for easy access to any Portacath or PICC line.   We strive to give you quality time with your provider. You may need to reschedule your appointment if you arrive late (15 or more minutes).  Arriving late affects you and other patients whose appointments are after yours.  Also, if you miss three or more appointments without notifying the office, you may be dismissed from the clinic at the provider's discretion.      For prescription refill requests, have your pharmacy contact our office and allow 72 hours for refills to be completed.    Today you received the following chemotherapy and/or immunotherapy agents- gemzar, cisplatin      To help prevent nausea and vomiting after your treatment, we encourage you to take your nausea medication as directed.  BELOW ARE SYMPTOMS THAT SHOULD BE REPORTED IMMEDIATELY: *FEVER GREATER THAN 100.4 F (38 C) OR HIGHER *CHILLS OR SWEATING *NAUSEA AND VOMITING THAT IS NOT CONTROLLED WITH YOUR NAUSEA MEDICATION *UNUSUAL SHORTNESS OF BREATH *UNUSUAL BRUISING OR BLEEDING *URINARY PROBLEMS (pain or burning when urinating, or frequent urination) *BOWEL PROBLEMS (unusual diarrhea, constipation, pain near the anus) TENDERNESS IN MOUTH AND THROAT WITH OR WITHOUT PRESENCE OF ULCERS (sore throat, sores in mouth, or a toothache) UNUSUAL RASH, SWELLING OR PAIN  UNUSUAL VAGINAL DISCHARGE OR ITCHING   Items with * indicate a potential emergency and should be followed up as soon as possible or go to the Emergency Department if any problems should occur.  Please show the CHEMOTHERAPY ALERT CARD or IMMUNOTHERAPY ALERT CARD at  check-in to the Emergency Department and triage nurse.  Should you have questions after your visit or need to cancel or reschedule your appointment, please contact [facility]  [phone] and follow the prompts.  Office hours are 8:00 a.m. to 4:30 p.m. Monday - Friday. Please note that voicemails left after 4:00 p.m. may not be returned until the following business day.  We are closed weekends and major holidays. You have access to a nurse at all times for urgent questions. Please call the main number to the clinic [phone] and follow the prompts.  For any non-urgent questions, you may also contact your provider using MyChart. We now offer e-Visits for anyone 18 and older to request care online for non-urgent symptoms. For details visit [email or web address].   Also download the MyChart app! Go to the app store, search "MyChart", open the app, select , and log in with your MyChart username and password.    

## 2022-10-19 ENCOUNTER — Inpatient Hospital Stay: Payer: BC Managed Care – PPO

## 2022-10-19 DIAGNOSIS — Z79899 Other long term (current) drug therapy: Secondary | ICD-10-CM | POA: Diagnosis not present

## 2022-10-19 DIAGNOSIS — I119 Hypertensive heart disease without heart failure: Secondary | ICD-10-CM | POA: Diagnosis not present

## 2022-10-19 DIAGNOSIS — C221 Intrahepatic bile duct carcinoma: Secondary | ICD-10-CM | POA: Diagnosis not present

## 2022-10-19 DIAGNOSIS — C7800 Secondary malignant neoplasm of unspecified lung: Secondary | ICD-10-CM | POA: Diagnosis not present

## 2022-10-19 DIAGNOSIS — D869 Sarcoidosis, unspecified: Secondary | ICD-10-CM | POA: Diagnosis not present

## 2022-10-19 DIAGNOSIS — C7801 Secondary malignant neoplasm of right lung: Secondary | ICD-10-CM

## 2022-10-19 DIAGNOSIS — Z5111 Encounter for antineoplastic chemotherapy: Secondary | ICD-10-CM | POA: Diagnosis not present

## 2022-10-19 DIAGNOSIS — R978 Other abnormal tumor markers: Secondary | ICD-10-CM | POA: Diagnosis not present

## 2022-10-19 MED ORDER — PEGFILGRASTIM-JMDB 6 MG/0.6ML ~~LOC~~ SOSY
6.0000 mg | PREFILLED_SYRINGE | Freq: Once | SUBCUTANEOUS | Status: AC
  Administered 2022-10-19: 6 mg via SUBCUTANEOUS
  Filled 2022-10-19: qty 0.6

## 2022-10-29 ENCOUNTER — Ambulatory Visit
Admission: RE | Admit: 2022-10-29 | Discharge: 2022-10-29 | Disposition: A | Discharge status: Home / Self Care | Payer: BC Managed Care – PPO | Attending: Nurse Practitioner | Admitting: Nurse Practitioner

## 2022-10-29 ENCOUNTER — Encounter: Payer: Self-pay | Admitting: Internal Medicine

## 2022-10-29 ENCOUNTER — Ambulatory Visit
Admission: RE | Admit: 2022-10-29 | Discharge: 2022-10-29 | Disposition: A | Discharge status: Home / Self Care | Payer: BC Managed Care – PPO | Source: Ambulatory Visit | Attending: Nurse Practitioner | Admitting: Nurse Practitioner

## 2022-10-29 ENCOUNTER — Encounter: Payer: Self-pay | Admitting: Nurse Practitioner

## 2022-10-29 ENCOUNTER — Inpatient Hospital Stay: Payer: BC Managed Care – PPO | Attending: Internal Medicine

## 2022-10-29 ENCOUNTER — Inpatient Hospital Stay (HOSPITAL_BASED_OUTPATIENT_CLINIC_OR_DEPARTMENT_OTHER): Payer: BC Managed Care – PPO | Admitting: Nurse Practitioner

## 2022-10-29 VITALS — BP 128/95 | HR 107 | Temp 96.5°F | Resp 19 | Wt 242.5 lb

## 2022-10-29 DIAGNOSIS — C7802 Secondary malignant neoplasm of left lung: Secondary | ICD-10-CM

## 2022-10-29 DIAGNOSIS — Z79899 Other long term (current) drug therapy: Secondary | ICD-10-CM | POA: Insufficient documentation

## 2022-10-29 DIAGNOSIS — C7800 Secondary malignant neoplasm of unspecified lung: Secondary | ICD-10-CM | POA: Diagnosis not present

## 2022-10-29 DIAGNOSIS — Z5111 Encounter for antineoplastic chemotherapy: Secondary | ICD-10-CM | POA: Diagnosis not present

## 2022-10-29 DIAGNOSIS — R051 Acute cough: Secondary | ICD-10-CM

## 2022-10-29 DIAGNOSIS — C7801 Secondary malignant neoplasm of right lung: Secondary | ICD-10-CM

## 2022-10-29 DIAGNOSIS — Z79620 Long term (current) use of immunosuppressive biologic: Secondary | ICD-10-CM | POA: Diagnosis not present

## 2022-10-29 DIAGNOSIS — I10 Essential (primary) hypertension: Secondary | ICD-10-CM | POA: Diagnosis not present

## 2022-10-29 DIAGNOSIS — R197 Diarrhea, unspecified: Secondary | ICD-10-CM | POA: Insufficient documentation

## 2022-10-29 DIAGNOSIS — C221 Intrahepatic bile duct carcinoma: Secondary | ICD-10-CM

## 2022-10-29 DIAGNOSIS — R630 Anorexia: Secondary | ICD-10-CM | POA: Diagnosis not present

## 2022-10-29 DIAGNOSIS — R059 Cough, unspecified: Secondary | ICD-10-CM | POA: Diagnosis not present

## 2022-10-29 DIAGNOSIS — R918 Other nonspecific abnormal finding of lung field: Secondary | ICD-10-CM | POA: Diagnosis not present

## 2022-10-29 LAB — CBC WITH DIFFERENTIAL (CANCER CENTER ONLY)
Abs Immature Granulocytes: 0.29 10*3/uL — ABNORMAL HIGH (ref 0.00–0.07)
Basophils Absolute: 0.1 10*3/uL (ref 0.0–0.1)
Basophils Relative: 1 %
Eosinophils Absolute: 0.1 10*3/uL (ref 0.0–0.5)
Eosinophils Relative: 1 %
HCT: 35.1 % — ABNORMAL LOW (ref 36.0–46.0)
Hemoglobin: 11.7 g/dL — ABNORMAL LOW (ref 12.0–15.0)
Immature Granulocytes: 3 %
Lymphocytes Relative: 8 %
Lymphs Abs: 0.8 10*3/uL (ref 0.7–4.0)
MCH: 33.4 pg (ref 26.0–34.0)
MCHC: 33.3 g/dL (ref 30.0–36.0)
MCV: 100.3 fL — ABNORMAL HIGH (ref 80.0–100.0)
Monocytes Absolute: 0.5 10*3/uL (ref 0.1–1.0)
Monocytes Relative: 5 %
Neutro Abs: 9 10*3/uL — ABNORMAL HIGH (ref 1.7–7.7)
Neutrophils Relative %: 82 %
Platelet Count: 195 10*3/uL (ref 150–400)
RBC: 3.5 MIL/uL — ABNORMAL LOW (ref 3.87–5.11)
RDW: 14.3 % (ref 11.5–15.5)
WBC Count: 10.8 10*3/uL — ABNORMAL HIGH (ref 4.0–10.5)
nRBC: 0.3 % — ABNORMAL HIGH (ref 0.0–0.2)

## 2022-10-29 LAB — CMP (CANCER CENTER ONLY)
ALT: 24 U/L (ref 0–44)
AST: 26 U/L (ref 15–41)
Albumin: 3.9 g/dL (ref 3.5–5.0)
Alkaline Phosphatase: 171 U/L — ABNORMAL HIGH (ref 38–126)
Anion gap: 7 (ref 5–15)
BUN: 12 mg/dL (ref 6–20)
CO2: 23 mmol/L (ref 22–32)
Calcium: 9.1 mg/dL (ref 8.9–10.3)
Chloride: 107 mmol/L (ref 98–111)
Creatinine: 0.77 mg/dL (ref 0.44–1.00)
GFR, Estimated: >60 mL/min (ref >60)
Glucose, Bld: 100 mg/dL — ABNORMAL HIGH (ref 70–99)
Potassium: 3.8 mmol/L (ref 3.5–5.1)
Sodium: 137 mmol/L (ref 135–145)
Total Bilirubin: 0.4 mg/dL (ref 0.3–1.2)
Total Protein: 6.9 g/dL (ref 6.5–8.1)

## 2022-10-29 LAB — MAGNESIUM: Magnesium: 1.9 mg/dL (ref 1.7–2.4)

## 2022-10-29 LAB — TSH: TSH: 2.389 u[IU]/mL (ref 0.350–4.500)

## 2022-10-29 MED ORDER — SODIUM CHLORIDE 0.9% FLUSH
10.0000 mL | Freq: Once | INTRAVENOUS | Status: AC
  Administered 2022-10-29: 10 mL via INTRAVENOUS
  Filled 2022-10-29: qty 10

## 2022-10-29 MED ORDER — HEPARIN SOD (PORK) LOCK FLUSH 100 UNIT/ML IV SOLN
500.0000 [IU] | Freq: Once | INTRAVENOUS | Status: AC
  Administered 2022-10-29: 500 [IU] via INTRAVENOUS
  Filled 2022-10-29: qty 5

## 2022-10-29 MED ORDER — HYDROCOD POLI-CHLORPHE POLI ER 10-8 MG/5ML PO SUER
5.0000 mL | Freq: Every evening | ORAL | 0 refills | Status: DC | PRN
Start: 2022-10-29 — End: 2023-01-07

## 2022-10-29 MED ORDER — LEVOFLOXACIN 500 MG PO TABS: 500.0000 mg | ORAL_TABLET | Freq: Every day | ORAL | 0 refills | Status: AC

## 2022-10-29 MED FILL — Fosaprepitant Dimeglumine For IV Infusion 150 MG (Base Eq): INTRAVENOUS | Qty: 5 | Status: AC

## 2022-10-29 MED FILL — Dexamethasone Sodium Phosphate Inj 100 MG/10ML: INTRAMUSCULAR | Qty: 1 | Status: AC

## 2022-10-29 NOTE — Progress Notes (Signed)
[facility] CONSULT NOTE  Patient Care Team: Reubin Milan, MD as PCP - General (Internal Medicine) Glory Buff, RN as Oncology Nurse Navigator Michaelyn Barter, MD as Consulting Physician (Oncology)  CANCER STAGING   Cancer Staging  Cholangiocarcinoma [facility]) Staging form: Intrahepatic Bile Duct, AJCC 8th Edition - Clinical stage from 08/26/2022: Stage IV (cM1) - Signed by Michaelyn Barter, MD on 09/03/2022 Stage prefix: Initial diagnosis  HISTORY OF PRESENTING ILLNESS:  Vicki Phillips 57 y.o. 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  Patient reports she presented to ED with complaints of chest pain.  Had chest x-ray which showed pulmonary nodules was referred to pulmonary.  Records not available.  Was then seen by Dr. Aundria Rud on 08/05/2022.  CT chest showed innumerable pulmonary nodules of varying sizes throughout the lung.  Largest spiculated appearing nodule of anterior right upper lobe measuring 2 x 2 cm, at least 1 ill-defined hypodense lesion of the anterior left lobe of the liver.  Despite stated history of sarcoidosis, there are no specific or characteristic features of sarcoidosis with respect to distribution, morphology, associated fibrotic change, nor associated mediastinal lymphadenopathy. 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  She has history of sarcoidosis but was never on treatment.  Reports that she does not see doctors on a regular basis because she is had bad experiences.  Interval history Patient seen today prior to cycle 3, day 1 of cisplatin and gemcitabine chemotherapy. She developed a URI last week and symptoms have improved but persist. She has facial pressure, cough that is interfering with sleep, nasal  congestion. Denies any nausea and vomiting. Appetite is fair.  Energy level is okay though tired from coughing and not sleeping.   I have reviewed her chart and materials related to her cancer extensively and collaborated history with the patient. Summary of oncologic history is as follows: Oncology History  Cholangiocarcinoma (HCC)  08/26/2022 Cancer Staging   Staging form: Intrahepatic Bile Duct, AJCC 8th Edition - Clinical stage from 08/26/2022: Stage IV (cM1) - Signed by Michaelyn Barter, MD on 09/03/2022 Stage prefix: Initial diagnosis   09/03/2022 Initial Diagnosis   Cholangiocarcinoma (HCC)   09/18/2022 -  Chemotherapy   Patient is on Treatment Plan : BILIARY TRACT Cisplatin + Gemcitabine D1,8 + Durvalumab (1500) D1 q21d / Durvalumab (1500) q28d     Metastasis to lung (HCC)  09/03/2022 Initial Diagnosis   Metastasis to lung (HCC)   09/18/2022 -  Chemotherapy   Patient is on Treatment Plan : BILIARY TRACT Cisplatin + Gemcitabine D1,8 + Durvalumab (1500) D1 q21d / Durvalumab (1500) q28d      MEDICAL HISTORY:  Past Medical History:  Diagnosis Date   Allergy    Sarcoidosis 2011   SURGICAL HISTORY: Past Surgical History:  Procedure Laterality Date   CESAREAN SECTION     x 2   COLONOSCOPY WITH PROPOFOL N/A 08/10/2019   Procedure: COLONOSCOPY WITH PROPOFOL;  Surgeon: Midge Minium, MD;  Location: [facility];  Service: Endoscopy;  Laterality: N/A;  priority 4   IR IMAGING GUIDED PORT INSERTION  09/11/2022   LUNG BIOPSY     SOCIAL HISTORY: Social History   Socioeconomic History   Marital status: Single    Spouse name: Not on file   Number of children: 2   Years  of education: Not on file   Highest education level: Not on file  Occupational History   Not on file  Tobacco Use   Smoking status: Never   Smokeless tobacco: Never  Vaping Use   Vaping status: Never Used  Substance and Sexual Activity   Alcohol use: Yes    Alcohol/week: 10.0 standard drinks of alcohol     Types: 10 Standard drinks or equivalent per week   Drug use: No   Sexual activity: Yes    Partners: Male    Birth control/protection: Surgical  Other Topics Concern   Not on file  Social History Narrative   Not on file   Social Determinants of Health   Financial Resource Strain: Low Risk  (09/18/2022)   Overall Financial Resource Strain (CARDIA)    Difficulty of Paying Living Expenses: Not hard at all  Food Insecurity: No Food Insecurity (08/24/2022)   Hunger Vital Sign    Worried About Running Out of Food in the Last Year: Never true    Ran Out of Food in the Last Year: Never true  Transportation Needs: No Transportation Needs (08/24/2022)   PRAPARE - Administrator, Civil Service (Medical): No    Lack of Transportation (Non-Medical): No  Physical Activity: Not on file  Stress: Not on file  Social Connections: Not on file  Intimate Partner Violence: Not At Risk (08/24/2022)   Humiliation, Afraid, Rape, and Kick questionnaire    Fear of Current or Ex-Partner: No    Emotionally Abused: No    Physically Abused: No    Sexually Abused: No    FAMILY HISTORY: Family History  Problem Relation Age of Onset   Stroke Mother    Heart disease Mother    Heart attack Father 30   Heart disease Father    Kidney disease Sister     ALLERGIES:  is allergic to other.  MEDICATIONS:  Current Outpatient Medications  Medication Sig Dispense Refill   amLODipine (NORVASC) 5 MG tablet TAKE 1 TABLET(5 MG) BY MOUTH DAILY 90 tablet 0   dexamethasone (DECADRON) 4 MG tablet Take 2 tablets (8 mg) by mouth daily x 3 days starting the day after cisplatin chemotherapy. Take with food. 30 tablet 1   ondansetron (ZOFRAN) 8 MG tablet Take 1 tablet (8 mg total) by mouth every 8 (eight) hours as needed for nausea or vomiting. Start on the third day after cisplatin. 30 tablet 1   prochlorperazine (COMPAZINE) 10 MG tablet Take 1 tablet (10 mg total) by mouth every 6 (six) hours as needed (Nausea or  vomiting). 30 tablet 1   lidocaine-prilocaine (EMLA) cream Apply 1 Application topically as needed. 30 g 0   lidocaine-prilocaine (EMLA) cream Apply to affected area once 30 g 3   lipase/protease/amylase (CREON) 36000 UNITS CPEP capsule Take 1 capsule (36,000 Units total) by mouth 3 (three) times daily with meals. May also take 1 capsule (36,000 Units total) as needed (with snacks). (Patient not taking: Reported on 10/15/2022) 240 capsule 1   loratadine (CLARITIN) 10 MG tablet Take 10 mg by mouth daily.     pseudoephedrine-acetaminophen (TYLENOL SINUS) 30-500 MG TABS tablet Take 1 tablet by mouth in the morning and at bedtime.     No current facility-administered medications for this visit.    REVIEW OF SYSTEMS:   Review of Systems  Constitutional:  Positive for malaise/fatigue. Negative for chills, fever and weight loss.  HENT:  Positive for congestion, sinus pain and sore throat. Negative for  hearing loss, nosebleeds and tinnitus.   Eyes:  Negative for blurred vision and double vision.  Respiratory:  Positive for cough and sputum production. Negative for hemoptysis, shortness of breath and wheezing.   Cardiovascular:  Negative for chest pain, palpitations and leg swelling.  Gastrointestinal:  Negative for abdominal pain, blood in stool, constipation, diarrhea, melena, nausea and vomiting.  Genitourinary:  Negative for dysuria and urgency.  Musculoskeletal:  Negative for back pain, falls, joint pain and myalgias.  Skin:  Negative for itching and rash.  Neurological:  Negative for dizziness, tingling, sensory change, loss of consciousness, weakness and headaches.  Endo/Heme/Allergies:  Negative for environmental allergies. Does not bruise/bleed easily.  Psychiatric/Behavioral:  Positive for depression. The patient is not nervous/anxious and does not have insomnia.   All other systems were reviewed with the patient and are negative.  PHYSICAL EXAMINATION: ECOG PERFORMANCE STATUS: 1 -  Symptomatic but completely ambulatory  Vitals:   10/29/22 0905  BP: (!) 128/95  Pulse: (!) 107  Resp: 19  Temp: (!) 96.5 F (35.8 C)  SpO2: 98%   Filed Weights   10/29/22 0905  Weight: 242 lb 8 oz (110 kg)   General: Well-developed, well-nourished, no acute distress. Eyes: Pink conjunctiva, anicteric sclera. Lungs: Clear to auscultation bilaterally.  Audible cough. Sounds congested.  Heart: Regular rate and rhythm.  Abdomen: Soft, nontender, nondistended.  Musculoskeletal: No edema, cyanosis, or clubbing. Neuro: Alert, answering all questions appropriately. Cranial nerves grossly intact. Skin: No rashes or petechiae noted. Psych: Normal affect.   LABORATORY DATA:  I have reviewed the data as listed Lab Results  Component Value Date   WBC 10.8 (H) 10/29/2022   HGB 11.7 (L) 10/29/2022   HCT 35.1 (L) 10/29/2022   MCV 100.3 (H) 10/29/2022   PLT 195 10/29/2022   Recent Labs    10/08/22 1012 10/15/22 1018 10/29/22 0833  NA 137 136  135 137  K 3.9 3.9  3.8 3.8  CL 108 105  105 107  CO2 22 23  23 23   GLUCOSE 101* 104*  103* 100*  BUN 14 15  14 12   CREATININE 0.73 0.74  0.70 0.77  CALCIUM 9.0 9.1  9.0 9.1  GFRNONAA >60 >60  >60 >60  PROT 7.0 7.0  6.8 6.9  ALBUMIN 3.9 3.9  3.8 3.9  AST 26 43*  41 26  ALT 27 66*  65* 24  ALKPHOS 116 101  99 171*  BILITOT 0.2* 0.5  0.6 0.4    RADIOGRAPHIC STUDIES: I have personally reviewed the radiological images as listed and agreed with the findings in the report. No results found.  ASSESSMENT & PLAN:  Vicki Phillips 57 y.o. female with pmh of sarcoidosis, not on treatment, was referred to oncology for diagnosis of stage IV intrahepatic cholangiocarcinoma.  # Cholangiocarcinoma, stage IV - Patient reports she presented to ED with complaints of chest pain.  Had chest x-ray which showed pulmonary nodules was referred to pulmonary.  Records not available.  Was then seen by Dr. Aundria Rud on 08/05/2022. CT chest  showed innumerable pulmonary nodules of varying sizes throughout the lung.  Largest spiculated appearing nodule of anterior right upper lobe measuring 2 x 2 cm, at least 1 ill-defined hypodense lesion of the anterior left lobe of the liver.   -PET/CT from 7-24 showed large tumor burden with numerous pulmonary nodules bilaterally.  Index lesion measuring 2 cm in the left upper lobe SUV 8.3, subpleural lateral left lower lobe measuring 1.5 cm SUV 10.2, posterior  right base 2 cm SUV 5.5, left supraclavicular lymph node 8 mm SUV 4.5, numerous liver masses, dominant lesion 8 cm in segment 4B SUV 12.7 with additional lesions in segment 5 6 and 4a, multiple abdominal lymph nodes are identified.  Pancreas looks normal.  Tracer avid sclerotic lesion within T3 SUV 4.5.  - s/p liver biopsy on 08/26/2022 showed metastatic moderately differentiated adenocarcinoma.  IHC stain positive for CK7 and CK20.  TTF-1, PAX8, GATA3 and CDX2 are negative.  Differentials include cholangiocarcinoma and pancreatic carcinoma.  Pancreatic cancer is less likely since it looks normal on the imaging.   -MRI brain with and without contrast done for headaches was negative for metastasis.  -PD-L1 0%.  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   - Second opinion at [facility] with Dr. Hulen Luster who agreed with treatment plan. Plan for follow up in 3 months.   -Pretreatment CA 19-9 352. CA 19-9 trending down to 213 (10/08/22).   - Labs reviewed and acceptable for treatment.  Proceed with cycle 3-day 1 of cisplatin 25 mg/m and gemcitabine 1000 mg/m.  Plan to do CT imaging after 4 cycles.   # Leukocytosis - GCSF added with cycle 2 for prevention of febrile neutropenias. Received on 10/19/22.  - Plan for claritin and tylenol as needed for aches and pains.  - can consider adding GCSF for d10 at next visit.   # Hypertension -Continue with amlodipine 5 mg daily.  Blood pressure improved  # Cough - > 1 week of symptoms. Negative  covid tests. Chest xray was negative for consolidation. Suspect sinusitis. Given duration of symptoms and active treatment recommend starting antibiotics. Plan for levaquin 500 mg daily x 7 days.  - Prescription for tussionex sent to pharmacy. Reviewed narcotic precautions.  - Can continue otcs.   # Diarrhea -GI panel and C. difficile was negative -Continue with Imodium as needed.  # Constipation - plan for daily miralax and senna 1-2 tablets if no bm daily.   # Weight loss # Poor appetite -Discussed about nutrition consult and appetite stimulant.  Would like to hold off  # History of sarcoidosis -Management per pulmonary -Never required treatment.  RTC tomorrow for cycle 3-day 1 of cisplatin, gemcitabine and durvalumab. RTC in 3 weeks for MD visit, labs, cycle 3-day 8 of cisplatin and gemcitabine   The total time spent in the appointment was 30 minutes encounter with patients including review of chart and various tests results, discussions about plan of care and coordination of care plan   All questions were answered. The patient knows to call the clinic with any problems, questions or concerns. No barriers to learning was detected.  Alinda Dooms, NP 10/29/2022

## 2022-10-30 ENCOUNTER — Ambulatory Visit
Admission: RE | Admit: 2022-10-30 | Discharge: 2022-10-30 | Disposition: A | Discharge status: Home / Self Care | Payer: BC Managed Care – PPO | Source: Ambulatory Visit | Attending: Nurse Practitioner

## 2022-10-30 ENCOUNTER — Other Ambulatory Visit: Payer: Self-pay | Admitting: *Deleted

## 2022-10-30 ENCOUNTER — Ambulatory Visit: Payer: BC Managed Care – PPO

## 2022-10-30 ENCOUNTER — Inpatient Hospital Stay: Payer: BC Managed Care – PPO

## 2022-10-30 VITALS — BP 136/93 | HR 98 | Temp 96.0°F

## 2022-10-30 DIAGNOSIS — Z5111 Encounter for antineoplastic chemotherapy: Secondary | ICD-10-CM | POA: Diagnosis not present

## 2022-10-30 DIAGNOSIS — M79604 Pain in right leg: Secondary | ICD-10-CM

## 2022-10-30 DIAGNOSIS — R630 Anorexia: Secondary | ICD-10-CM | POA: Diagnosis not present

## 2022-10-30 DIAGNOSIS — R059 Cough, unspecified: Secondary | ICD-10-CM | POA: Diagnosis not present

## 2022-10-30 DIAGNOSIS — Z79899 Other long term (current) drug therapy: Secondary | ICD-10-CM | POA: Diagnosis not present

## 2022-10-30 DIAGNOSIS — I10 Essential (primary) hypertension: Secondary | ICD-10-CM | POA: Diagnosis not present

## 2022-10-30 DIAGNOSIS — C7800 Secondary malignant neoplasm of unspecified lung: Secondary | ICD-10-CM | POA: Diagnosis not present

## 2022-10-30 DIAGNOSIS — C221 Intrahepatic bile duct carcinoma: Secondary | ICD-10-CM

## 2022-10-30 DIAGNOSIS — Z79620 Long term (current) use of immunosuppressive biologic: Secondary | ICD-10-CM | POA: Diagnosis not present

## 2022-10-30 DIAGNOSIS — R197 Diarrhea, unspecified: Secondary | ICD-10-CM | POA: Diagnosis not present

## 2022-10-30 DIAGNOSIS — C7801 Secondary malignant neoplasm of right lung: Secondary | ICD-10-CM

## 2022-10-30 LAB — T4: T4, Total: 7.7 ug/dL (ref 4.5–12.0)

## 2022-10-30 MED ORDER — SODIUM CHLORIDE 0.9 % IV SOLN
1500.0000 mg | Freq: Once | INTRAVENOUS | Status: AC
  Administered 2022-10-30: 1500 mg via INTRAVENOUS
  Filled 2022-10-30: qty 30

## 2022-10-30 MED ORDER — PALONOSETRON HCL INJECTION 0.25 MG/5ML
0.2500 mg | Freq: Once | INTRAVENOUS | Status: AC
  Administered 2022-10-30: 0.25 mg via INTRAVENOUS
  Filled 2022-10-30: qty 5

## 2022-10-30 MED ORDER — HEPARIN SOD (PORK) LOCK FLUSH 100 UNIT/ML IV SOLN
500.0000 [IU] | Freq: Once | INTRAVENOUS | Status: DC | PRN
  Filled 2022-10-30: qty 5

## 2022-10-30 MED ORDER — SODIUM CHLORIDE 0.9 % IV SOLN
1000.0000 mg/m2 | Freq: Once | INTRAVENOUS | Status: AC
  Administered 2022-10-30: 2318 mg via INTRAVENOUS
  Filled 2022-10-30: qty 60.96

## 2022-10-30 MED ORDER — SODIUM CHLORIDE 0.9 % IV SOLN
150.0000 mg | Freq: Once | INTRAVENOUS | Status: AC
  Administered 2022-10-30: 150 mg via INTRAVENOUS
  Filled 2022-10-30: qty 150

## 2022-10-30 MED ORDER — POTASSIUM CHLORIDE IN NACL 20-0.9 MEQ/L-% IV SOLN
Freq: Once | INTRAVENOUS | Status: AC
  Filled 2022-10-30: qty 1000

## 2022-10-30 MED ORDER — MAGNESIUM SULFATE 2 GM/50ML IV SOLN
2.0000 g | Freq: Once | INTRAVENOUS | Status: AC
  Administered 2022-10-30: 2 g via INTRAVENOUS
  Filled 2022-10-30: qty 50

## 2022-10-30 MED ORDER — SODIUM CHLORIDE 0.9 % IV SOLN
Freq: Once | INTRAVENOUS | Status: AC
  Filled 2022-10-30: qty 250

## 2022-10-30 MED ORDER — SODIUM CHLORIDE 0.9 % IV SOLN
10.0000 mg | Freq: Once | INTRAVENOUS | Status: AC
  Administered 2022-10-30: 10 mg via INTRAVENOUS
  Filled 2022-10-30: qty 10

## 2022-10-30 MED ORDER — SODIUM CHLORIDE 0.9 % IV SOLN
25.0000 mg/m2 | Freq: Once | INTRAVENOUS | Status: AC
  Administered 2022-10-30: 58 mg via INTRAVENOUS
  Filled 2022-10-30: qty 58

## 2022-10-30 NOTE — Telephone Encounter (Signed)
Rn spoke with patient. She is aware and agreeable to plan of care to order right lower extremity u/s. She will be in chemo infusion until almost 4 pm today. I will see if the u/s dept can accommodate after this infusion or if she needs to come on another day.

## 2022-10-30 NOTE — Telephone Encounter (Signed)
U/s scheduled at 530 pm today. Pt aware.

## 2022-10-30 NOTE — Patient Instructions (Signed)
[facility]  Discharge Instructions: Thank you for choosing [facility] to provide your oncology and hematology care.  If you have a lab appointment with the Cancer Center, please go directly to the Cancer Center and check in at the registration area.  Wear comfortable clothing and clothing appropriate for easy access to any Portacath or PICC line.   We strive to give you quality time with your provider. You may need to reschedule your appointment if you arrive late (15 or more minutes).  Arriving late affects you and other patients whose appointments are after yours.  Also, if you miss three or more appointments without notifying the office, you may be dismissed from the clinic at the provider's discretion.      For prescription refill requests, have your pharmacy contact our office and allow 72 hours for refills to be completed.    Today you received the following chemotherapy and/or immunotherapy agents Imfinzi, Cisplatin, Gemzar   To help prevent nausea and vomiting after your treatment, we encourage you to take your nausea medication as directed.  BELOW ARE SYMPTOMS THAT SHOULD BE REPORTED IMMEDIATELY: *FEVER GREATER THAN 100.4 F (38 C) OR HIGHER *CHILLS OR SWEATING *NAUSEA AND VOMITING THAT IS NOT CONTROLLED WITH YOUR NAUSEA MEDICATION *UNUSUAL SHORTNESS OF BREATH *UNUSUAL BRUISING OR BLEEDING *URINARY PROBLEMS (pain or burning when urinating, or frequent urination) *BOWEL PROBLEMS (unusual diarrhea, constipation, pain near the anus) TENDERNESS IN MOUTH AND THROAT WITH OR WITHOUT PRESENCE OF ULCERS (sore throat, sores in mouth, or a toothache) UNUSUAL RASH, SWELLING OR PAIN  UNUSUAL VAGINAL DISCHARGE OR ITCHING   Items with * indicate a potential emergency and should be followed up as soon as possible or go to the Emergency Department if any problems should occur.  Please show the CHEMOTHERAPY ALERT CARD or IMMUNOTHERAPY ALERT CARD  at check-in to the Emergency Department and triage nurse.  Should you have questions after your visit or need to cancel or reschedule your appointment, please contact [facility]  [phone] and follow the prompts.  Office hours are 8:00 a.m. to 4:30 p.m. Monday - Friday. Please note that voicemails left after 4:00 p.m. may not be returned until the following business day.  We are closed weekends and major holidays. You have access to a nurse at all times for urgent questions. Please call the main number to the clinic [phone] and follow the prompts.  For any non-urgent questions, you may also contact your provider using MyChart. We now offer e-Visits for anyone 26 and older to request care online for non-urgent symptoms. For details visit [email or web address].   Also download the MyChart app! Go to the app store, search "MyChart", open the app, select [facility], and log in with your MyChart username and password.

## 2022-11-05 ENCOUNTER — Inpatient Hospital Stay: Payer: BC Managed Care – PPO

## 2022-11-05 ENCOUNTER — Encounter: Payer: Self-pay | Admitting: Internal Medicine

## 2022-11-05 ENCOUNTER — Inpatient Hospital Stay (HOSPITAL_BASED_OUTPATIENT_CLINIC_OR_DEPARTMENT_OTHER): Payer: BC Managed Care – PPO | Admitting: Internal Medicine

## 2022-11-05 ENCOUNTER — Telehealth: Payer: Self-pay

## 2022-11-05 VITALS — BP 114/97 | HR 94 | Temp 98.2°F | Resp 18 | Ht 69.0 in | Wt 238.0 lb

## 2022-11-05 DIAGNOSIS — Z79899 Other long term (current) drug therapy: Secondary | ICD-10-CM | POA: Diagnosis not present

## 2022-11-05 DIAGNOSIS — C7801 Secondary malignant neoplasm of right lung: Secondary | ICD-10-CM | POA: Diagnosis not present

## 2022-11-05 DIAGNOSIS — C7802 Secondary malignant neoplasm of left lung: Secondary | ICD-10-CM

## 2022-11-05 DIAGNOSIS — C778 Secondary and unspecified malignant neoplasm of lymph nodes of multiple regions: Secondary | ICD-10-CM | POA: Diagnosis not present

## 2022-11-05 DIAGNOSIS — Z5112 Encounter for antineoplastic immunotherapy: Secondary | ICD-10-CM

## 2022-11-05 DIAGNOSIS — C221 Intrahepatic bile duct carcinoma: Secondary | ICD-10-CM

## 2022-11-05 DIAGNOSIS — R059 Cough, unspecified: Secondary | ICD-10-CM | POA: Diagnosis not present

## 2022-11-05 DIAGNOSIS — I10 Essential (primary) hypertension: Secondary | ICD-10-CM | POA: Diagnosis not present

## 2022-11-05 DIAGNOSIS — R630 Anorexia: Secondary | ICD-10-CM | POA: Diagnosis not present

## 2022-11-05 DIAGNOSIS — R197 Diarrhea, unspecified: Secondary | ICD-10-CM | POA: Diagnosis not present

## 2022-11-05 DIAGNOSIS — Z79620 Long term (current) use of immunosuppressive biologic: Secondary | ICD-10-CM | POA: Diagnosis not present

## 2022-11-05 DIAGNOSIS — Z5111 Encounter for antineoplastic chemotherapy: Secondary | ICD-10-CM | POA: Diagnosis not present

## 2022-11-05 DIAGNOSIS — M25561 Pain in right knee: Secondary | ICD-10-CM

## 2022-11-05 DIAGNOSIS — M79604 Pain in right leg: Secondary | ICD-10-CM

## 2022-11-05 DIAGNOSIS — C7800 Secondary malignant neoplasm of unspecified lung: Secondary | ICD-10-CM | POA: Diagnosis not present

## 2022-11-05 LAB — CMP (CANCER CENTER ONLY)
ALT: 61 U/L — ABNORMAL HIGH (ref 0–44)
AST: 30 U/L (ref 15–41)
Albumin: 3.8 g/dL (ref 3.5–5.0)
Alkaline Phosphatase: 112 U/L (ref 38–126)
Anion gap: 7 (ref 5–15)
BUN: 13 mg/dL (ref 6–20)
CO2: 24 mmol/L (ref 22–32)
Calcium: 9 mg/dL (ref 8.9–10.3)
Chloride: 103 mmol/L (ref 98–111)
Creatinine: 0.68 mg/dL (ref 0.44–1.00)
GFR, Estimated: >60 mL/min (ref >60)
Glucose, Bld: 106 mg/dL — ABNORMAL HIGH (ref 70–99)
Potassium: 3.9 mmol/L (ref 3.5–5.1)
Sodium: 134 mmol/L — ABNORMAL LOW (ref 135–145)
Total Bilirubin: 0.7 mg/dL (ref 0.3–1.2)
Total Protein: 6.7 g/dL (ref 6.5–8.1)

## 2022-11-05 LAB — MAGNESIUM: Magnesium: 2 mg/dL (ref 1.7–2.4)

## 2022-11-05 LAB — CBC WITH DIFFERENTIAL (CANCER CENTER ONLY)
Abs Immature Granulocytes: 0.01 10*3/uL (ref 0.00–0.07)
Basophils Absolute: 0 10*3/uL (ref 0.0–0.1)
Basophils Relative: 1 %
Eosinophils Absolute: 0 10*3/uL (ref 0.0–0.5)
Eosinophils Relative: 1 %
HCT: 32.1 % — ABNORMAL LOW (ref 36.0–46.0)
Hemoglobin: 10.8 g/dL — ABNORMAL LOW (ref 12.0–15.0)
Immature Granulocytes: 0 %
Lymphocytes Relative: 25 %
Lymphs Abs: 0.7 10*3/uL (ref 0.7–4.0)
MCH: 33.2 pg (ref 26.0–34.0)
MCHC: 33.6 g/dL (ref 30.0–36.0)
MCV: 98.8 fL (ref 80.0–100.0)
Monocytes Absolute: 0.3 10*3/uL (ref 0.1–1.0)
Monocytes Relative: 9 %
Neutro Abs: 1.8 10*3/uL (ref 1.7–7.7)
Neutrophils Relative %: 64 %
Platelet Count: 253 10*3/uL (ref 150–400)
RBC: 3.25 MIL/uL — ABNORMAL LOW (ref 3.87–5.11)
RDW: 13.9 % (ref 11.5–15.5)
WBC Count: 2.9 10*3/uL — ABNORMAL LOW (ref 4.0–10.5)
nRBC: 0 % (ref 0.0–0.2)

## 2022-11-05 MED FILL — Fosaprepitant Dimeglumine For IV Infusion 150 MG (Base Eq): INTRAVENOUS | Qty: 5 | Status: AC

## 2022-11-05 MED FILL — Dexamethasone Sodium Phosphate Inj 100 MG/10ML: INTRAMUSCULAR | Qty: 1 | Status: AC

## 2022-11-05 NOTE — Telephone Encounter (Signed)
ERROR

## 2022-11-05 NOTE — Progress Notes (Signed)
[facility] CONSULT NOTE  Patient Care Team: Reubin Milan, MD as PCP - General (Internal Medicine) Glory Buff, RN as Oncology Nurse Navigator Michaelyn Barter, MD as Consulting Physician (Oncology)   CANCER STAGING   Cancer Staging  Cholangiocarcinoma [facility]) Staging form: Intrahepatic Bile Duct, AJCC 8th Edition - Clinical stage from 08/26/2022: Stage IV (cM1) - Signed by Michaelyn Barter, MD on 09/03/2022 Stage prefix: Initial diagnosis   ASSESSMENT & PLAN:  Vicki Phillips 57 y.o. 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  # Cholangiocarcinoma, stage IV -PET/CT from 7-24 showed large tumor burden with numerous pulmonary nodules bilaterally.  Index lesion measuring 2 cm in the left upper lobe SUV 8.3, subpleural lateral left lower lobe measuring 1.5 cm SUV 10.2, posterior right base 2 cm SUV 5.5, left supraclavicular lymph node 8 mm SUV 4.5, numerous liver masses, dominant lesion 8 cm in segment 4B SUV 12.7 with additional lesions in segment 5 6 and 4a, multiple abdominal lymph nodes are identified.  Pancreas looks normal.  Tracer avid sclerotic lesion within T3 SUV 4.5.  - s/p liver biopsy on 08/26/2022 showed metastatic moderately differentiated adenocarcinoma.  IHC stain positive for CK7 and CK20.  TTF-1, PAX8, GATA3 and CDX2 are negative.  Differentials include cholangiocarcinoma and pancreatic carcinoma.  Pancreatic cancer is less likely since it looks normal on the imaging.   Plan- -patient sought second opinion with Dr. Hulen Luster at [facility] who agreed with the treatment plan.  Plan for follow-up in 3 months.  -Labs reviewed and acceptable for treatment.  Will proceed with cycle 3-day 1 of cisplatin 25 mg/m and gemcitabine 1000 mg/m.  Plan to do CT imaging after 4 cycles.  -PD-L1 0%.  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  Full report below. -Pretreatment CA 19-9 352.   CA 19-9 trending down to 225.  Will continue to monitor CA 19-9.  # Right knee pain -Venous Dopplers showing possible right bursal cyst.  No clot.  No improvement in pain affecting ambulation.  Using warm cold compresses. -Will schedule for MRI right knee with and without contrast to further evaluate and referral to orthopedics.  # Hypertension -Continue with amlodipine 5 mg daily.  Blood pressure improved  # Cough -Was prescribed Tussionex last week which is helping her.  # Diarrhea -GI panel and C. difficile was negative -Continue with Imodium as needed.  # Poor appetite -Discussed about nutrition consult and appetite stimulant.  Would like to hold off  # History of sarcoidosis -Management per pulmonary -Never required treatment.  Orders Placed This Encounter  Procedures   MR KNEE RIGHT W WO CONTRAST    Standing Status:   Future    Standing Expiration Date:   11/05/2023    Order Specific Question:   If indicated for the ordered procedure, I authorize the administration of contrast media per Radiology protocol    Answer:   Yes    Order Specific Question:   What is the patient's sedation requirement?    Answer:   No Sedation    Order Specific Question:   Does the patient have a pacemaker or implanted devices?    Answer:   No    Order Specific Question:   Preferred imaging location?    Answer:   [facility] (table limit - 550lbs)   CT CHEST ABDOMEN PELVIS W CONTRAST    Standing Status:   Future    Standing Expiration Date:  11/05/2023    Order Specific Question:   If indicated for the ordered procedure, I authorize the administration of contrast media per Radiology protocol    Answer:   Yes    Order Specific Question:   Does the patient have a contrast media/X-ray dye allergy?    Answer:   No    Order Specific Question:   Is patient pregnant?    Answer:   No    Order Specific Question:   Preferred imaging location?    Answer:   [facility]    Order Specific  Question:   If indicated for the ordered procedure, I authorize the administration of oral contrast media per Radiology protocol    Answer:   Yes   CBC with Differential (Cancer Center Only)    Standing Status:   Future    Standing Expiration Date:   11/19/2023   CMP (Cancer Center only)    Standing Status:   Future    Standing Expiration Date:   11/19/2023   Magnesium    Standing Status:   Future    Standing Expiration Date:   11/19/2023   CBC with Differential (Cancer Center Only)    Standing Status:   Future    Standing Expiration Date:   11/26/2023   CMP (Cancer Center only)    Standing Status:   Future    Standing Expiration Date:   11/26/2023   Magnesium    Standing Status:   Future    Standing Expiration Date:   11/26/2023   Cancer antigen 19-9    Standing Status:   Future    Standing Expiration Date:   11/05/2023   AMB referral to orthopedics    Standing Status:   Future    Standing Expiration Date:   11/05/2023    Referral Priority:   Routine    Referral Type:   Consultation    Referral Reason:   Specialty Services Required    Number of Visits Requested:   1   RTC in 2 weeks for APP visit, labs, cycle 4-day 1 of cisplatin, gemcitabine and durvalumab RTC in 3 weeks for MD visit, labs, cycle 4-day 8 of cisplatin and gemcitabine  The total time spent in the appointment was 30 minutes encounter with patients including review of chart and various tests results, discussions about plan of care and coordination of care plan   All questions were answered. The patient knows to call the clinic with any problems, questions or concerns. No barriers to learning was detected.  Michaelyn Barter, MD 9/12/20241:09 PM   HISTORY OF PRESENTING ILLNESS:  Vicki Phillips 57 y.o. 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  Patient reports she presented to ED with complaints of chest pain.  Had chest x-ray which  showed pulmonary nodules was referred to pulmonary.  Records not available.  Was then seen by Dr. Aundria Rud on 08/05/2022.  CT chest showed innumerable pulmonary nodules of varying sizes throughout the lung.  Largest spiculated appearing nodule of anterior right upper lobe measuring 2 x 2 cm, at least 1 ill-defined hypodense lesion of the anterior left lobe of the liver.  Despite stated history of sarcoidosis, there are no specific or characteristic features of sarcoidosis with respect to distribution, morphology, associated fibrotic change, nor associated mediastinal lymphadenopathy. These nodules are new in comparison to remote prior PET-CT dated 11/08/2008, and greatly increased in number compared to prior radiographs dated 10/14/2021,  and presumed pulmonary metastatic disease.  She has history of sarcoidosis but was never on treatment.  Reports that she does not see doctors on a regular basis because she is had bad experiences.  Interval history Patient seen today prior to cycle 3-day 1 of cisplatin and gemcitabine, Durvalumab Recovering from sinus infection.  Feeling much better.  Completed antibiotics.  Using Tussionex cough syrup which is helping.  Her symptoms are pretty much consistent with every chemo cycle.  Alternating between diarrhea and constipation.  Appetite goes down once the steroid effects weans.  Continues to have right knee pain affecting ambulation.  I have reviewed her chart and materials related to her cancer extensively and collaborated history with the patient. Summary of oncologic history is as follows: Oncology History  Cholangiocarcinoma (HCC)  08/26/2022 Cancer Staging   Staging form: Intrahepatic Bile Duct, AJCC 8th Edition - Clinical stage from 08/26/2022: Stage IV (cM1) - Signed by Michaelyn Barter, MD on 09/03/2022 Stage prefix: Initial diagnosis   09/03/2022 Initial Diagnosis   Cholangiocarcinoma (HCC)   09/18/2022 -  Chemotherapy   Patient is on Treatment Plan :  BILIARY TRACT Cisplatin + Gemcitabine D1,8 + Durvalumab (1500) D1 q21d / Durvalumab (1500) q28d     Metastasis to lung (HCC)  09/03/2022 Initial Diagnosis   Metastasis to lung (HCC)   09/18/2022 -  Chemotherapy   Patient is on Treatment Plan : BILIARY TRACT Cisplatin + Gemcitabine D1,8 + Durvalumab (1500) D1 q21d / Durvalumab (1500) q28d       MEDICAL HISTORY:  Past Medical History:  Diagnosis Date   Allergy    Sarcoidosis 2011    SURGICAL HISTORY: Past Surgical History:  Procedure Laterality Date   CESAREAN SECTION     x 2   COLONOSCOPY WITH PROPOFOL N/A 08/10/2019   Procedure: COLONOSCOPY WITH PROPOFOL;  Surgeon: Midge Minium, MD;  Location: [facility];  Service: Endoscopy;  Laterality: N/A;  priority 4   IR IMAGING GUIDED PORT INSERTION  09/11/2022   LUNG BIOPSY      SOCIAL HISTORY: Social History   Socioeconomic History   Marital status: Single    Spouse name: Not on file   Number of children: 2   Years of education: Not on file   Highest education level: Not on file  Occupational History   Not on file  Tobacco Use   Smoking status: Never   Smokeless tobacco: Never  Vaping Use   Vaping status: Never Used  Substance and Sexual Activity   Alcohol use: Yes    Alcohol/week: 10.0 standard drinks of alcohol    Types: 10 Standard drinks or equivalent per week   Drug use: No   Sexual activity: Yes    Partners: Male    Birth control/protection: Surgical  Other Topics Concern   Not on file  Social History Narrative   Not on file   Social Determinants of Health   Financial Resource Strain: Low Risk  (09/18/2022)   Overall Financial Resource Strain (CARDIA)    Difficulty of Paying Living Expenses: Not hard at all  Food Insecurity: No Food Insecurity (08/24/2022)   Hunger Vital Sign    Worried About Running Out of Food in the Last Year: Never true    Ran Out of Food in the Last Year: Never true  Transportation Needs: No Transportation Needs (08/24/2022)    PRAPARE - Administrator, Civil Service (Medical): No    Lack of Transportation (Non-Medical): No  Physical Activity: Not on  file  Stress: Not on file  Social Connections: Not on file  Intimate Partner Violence: Not At Risk (08/24/2022)   Humiliation, Afraid, Rape, and Kick questionnaire    Fear of Current or Ex-Partner: No    Emotionally Abused: No    Physically Abused: No    Sexually Abused: No    FAMILY HISTORY: Family History  Problem Relation Age of Onset   Stroke Mother    Heart disease Mother    Heart attack Father 28   Heart disease Father    Kidney disease Sister     ALLERGIES:  is allergic to other.  MEDICATIONS:  Current Outpatient Medications  Medication Sig Dispense Refill   amLODipine (NORVASC) 5 MG tablet TAKE 1 TABLET(5 MG) BY MOUTH DAILY 90 tablet 0   chlorpheniramine-HYDROcodone (TUSSIONEX) 10-8 MG/5ML Take 5 mLs by mouth at bedtime as needed for cough. 140 mL 0   dexamethasone (DECADRON) 4 MG tablet Take 2 tablets (8 mg) by mouth daily x 3 days starting the day after cisplatin chemotherapy. Take with food. 30 tablet 1   levofloxacin (LEVAQUIN) 500 MG tablet Take 1 tablet (500 mg total) by mouth daily for 7 days. 7 tablet 0   loratadine (CLARITIN) 10 MG tablet Take 10 mg by mouth daily.     pseudoephedrine-acetaminophen (TYLENOL SINUS) 30-500 MG TABS tablet Take 1 tablet by mouth in the morning and at bedtime.     ondansetron (ZOFRAN) 8 MG tablet Take 1 tablet (8 mg total) by mouth every 8 (eight) hours as needed for nausea or vomiting. Start on the third day after cisplatin. (Patient not taking: Reported on 11/05/2022) 30 tablet 1   prochlorperazine (COMPAZINE) 10 MG tablet Take 1 tablet (10 mg total) by mouth every 6 (six) hours as needed (Nausea or vomiting). (Patient not taking: Reported on 11/05/2022) 30 tablet 1   No current facility-administered medications for this visit.    REVIEW OF SYSTEMS:   Pertinent information mentioned in HPI All  other systems were reviewed with the patient and are negative.  PHYSICAL EXAMINATION: ECOG PERFORMANCE STATUS: 1 - Symptomatic but completely ambulatory  Vitals:   11/05/22 0916  BP: (!) 114/97  Pulse: 94  Resp: 18  Temp: 98.2 F (36.8 C)  SpO2: 98%    Filed Weights   11/05/22 0911  Weight: 238 lb (108 kg)     GENERAL:alert, no distress and comfortable SKIN: skin color, texture, turgor are normal, no rashes or significant lesions EYES: normal, conjunctiva are pink and non-injected, sclera clear OROPHARYNX:no exudate, no erythema and lips, buccal mucosa, and tongue normal  NECK: supple, thyroid normal size, non-tender, without nodularity LYMPH:  no palpable lymphadenopathy in the cervical, axillary or inguinal LUNGS: clear to auscultation and percussion with normal breathing effort HEART: regular rate & rhythm and no murmurs and no lower extremity edema ABDOMEN:abdomen soft, non-tender and normal bowel sounds Musculoskeletal:no cyanosis of digits and no clubbing  PSYCH: alert & oriented x 3 with fluent speech NEURO: no focal motor/sensory deficits  LABORATORY DATA:  I have reviewed the data as listed Lab Results  Component Value Date   WBC 2.9 (L) 11/05/2022   HGB 10.8 (L) 11/05/2022   HCT 32.1 (L) 11/05/2022   MCV 98.8 11/05/2022   PLT 253 11/05/2022   Recent Labs    10/15/22 1018 10/29/22 0833 11/05/22 0838  NA 136  135 137 134*  K 3.9  3.8 3.8 3.9  CL 105  105 107 103  CO2 23  23  23 24  GLUCOSE 104*  103* 100* 106*  BUN 15  14 12 13   CREATININE 0.74  0.70 0.77 0.68  CALCIUM 9.1  9.0 9.1 9.0  GFRNONAA >60  >60 >60 >60  PROT 7.0  6.8 6.9 6.7  ALBUMIN 3.9  3.8 3.9 3.8  AST 43*  41 26 30  ALT 66*  65* 24 61*  ALKPHOS 101  99 171* 112  BILITOT 0.5  0.6 0.4 0.7    RADIOGRAPHIC STUDIES: I have personally reviewed the radiological images as listed and agreed with the findings in the report. US Venous Img Lower Unilateral Right  Result  Date: 10/30/2022 CLINICAL DATA:  Right lower pain for the past 2 weeks. Evaluate for DVT. EXAM: RIGHT LOWER EXTREMITY VENOUS DOPPLER ULTRASOUND TECHNIQUE: Gray-scale sonography with graded compression, as well as color Doppler and duplex ultrasound were performed to evaluate the lower extremity deep venous systems from the level of the common femoral vein and including the common femoral, femoral, profunda femoral, popliteal and calf veins including the posterior tibial, peroneal and gastrocnemius veins when visible. The superficial great saphenous vein was also interrogated. Spectral Doppler was utilized to evaluate flow at rest and with distal augmentation maneuvers in the common femoral, femoral and popliteal veins. COMPARISON:  None Available. FINDINGS: Contralateral Common Femoral Vein: Respiratory phasicity is normal and symmetric with the symptomatic side. No evidence of thrombus. Normal compressibility. Common Femoral Vein: No evidence of thrombus. Normal compressibility, respiratory phasicity and response to augmentation. Saphenofemoral Junction: No evidence of thrombus. Normal compressibility and flow on color Doppler imaging. Profunda Femoral Vein: No evidence of thrombus. Normal compressibility and flow on color Doppler imaging. Femoral Vein: No evidence of thrombus. Normal compressibility, respiratory phasicity and response to augmentation. Popliteal Vein: No evidence of thrombus. Normal compressibility, respiratory phasicity and response to augmentation. Calf Veins: No evidence of thrombus. Normal compressibility and flow on color Doppler imaging. Superficial Great Saphenous Vein: No evidence of thrombus. Normal compressibility. Other Findings: Sonographic evaluation of patient's area of discomfort involving the medial aspect of the right knee correlates with serpiginous fluid collection, nonspecific though potentially representative of a bursal cyst. No blood flow is demonstrated within this apparent  fluid collection. IMPRESSION: 1. No evidence of DVT within the right lower extremity. 2. Sonographic evaluation of the patient's area of discomfort involving the medial aspect of the right knee correlates with a serpiginous fluid collection, nonspecific though potentially representative of a bursal cyst. Further evaluation with the MRI could performed as indicated. Electronically Signed   By: Simonne Come M.D.   On: 10/30/2022 16:58   DG Chest 2 View  Result Date: 10/29/2022 CLINICAL DATA:  Metastatic cholangiocarcinoma EXAM: CHEST - 2 VIEW COMPARISON:  10/14/2021; chest CT-08/18/2022 PET-CT-08/25/2022 FINDINGS: Grossly unchanged cardiac silhouette and mediastinal contours. Stable position of support apparatus. Redemonstrated innumerable bilateral pulmonary nodules compatible with diffuse metastatic disease. No discrete focal airspace opacities. No pleural effusion or pneumothorax. No evidence of edema. No acute osseous abnormalities. IMPRESSION: 1. Redemonstrated innumerable bilateral pulmonary nodules compatible with diffuse metastatic disease. 2. No acute cardiopulmonary disease. Electronically Signed   By: Simonne Come M.D.   On: 10/29/2022 11:21

## 2022-11-06 ENCOUNTER — Inpatient Hospital Stay: Payer: BC Managed Care – PPO

## 2022-11-06 ENCOUNTER — Other Ambulatory Visit: Payer: Self-pay

## 2022-11-06 VITALS — BP 141/97 | HR 95 | Temp 99.5°F | Resp 19

## 2022-11-06 DIAGNOSIS — C221 Intrahepatic bile duct carcinoma: Secondary | ICD-10-CM

## 2022-11-06 DIAGNOSIS — C7800 Secondary malignant neoplasm of unspecified lung: Secondary | ICD-10-CM | POA: Diagnosis not present

## 2022-11-06 DIAGNOSIS — Z79620 Long term (current) use of immunosuppressive biologic: Secondary | ICD-10-CM | POA: Diagnosis not present

## 2022-11-06 DIAGNOSIS — R059 Cough, unspecified: Secondary | ICD-10-CM | POA: Diagnosis not present

## 2022-11-06 DIAGNOSIS — Z5111 Encounter for antineoplastic chemotherapy: Secondary | ICD-10-CM | POA: Diagnosis not present

## 2022-11-06 DIAGNOSIS — R630 Anorexia: Secondary | ICD-10-CM | POA: Diagnosis not present

## 2022-11-06 DIAGNOSIS — C7802 Secondary malignant neoplasm of left lung: Secondary | ICD-10-CM

## 2022-11-06 DIAGNOSIS — Z79899 Other long term (current) drug therapy: Secondary | ICD-10-CM | POA: Diagnosis not present

## 2022-11-06 DIAGNOSIS — I10 Essential (primary) hypertension: Secondary | ICD-10-CM | POA: Diagnosis not present

## 2022-11-06 DIAGNOSIS — R197 Diarrhea, unspecified: Secondary | ICD-10-CM | POA: Diagnosis not present

## 2022-11-06 MED ORDER — POTASSIUM CHLORIDE IN NACL 20-0.9 MEQ/L-% IV SOLN
Freq: Once | INTRAVENOUS | Status: AC
  Filled 2022-11-06: qty 1000

## 2022-11-06 MED ORDER — HEPARIN SOD (PORK) LOCK FLUSH 100 UNIT/ML IV SOLN
500.0000 [IU] | Freq: Once | INTRAVENOUS | Status: AC | PRN
  Administered 2022-11-06: 500 [IU]
  Filled 2022-11-06: qty 5

## 2022-11-06 MED ORDER — PALONOSETRON HCL INJECTION 0.25 MG/5ML
0.2500 mg | Freq: Once | INTRAVENOUS | Status: AC
  Administered 2022-11-06: 0.25 mg via INTRAVENOUS
  Filled 2022-11-06: qty 5

## 2022-11-06 MED ORDER — MAGNESIUM SULFATE 2 GM/50ML IV SOLN
2.0000 g | Freq: Once | INTRAVENOUS | Status: AC
  Administered 2022-11-06: 2 g via INTRAVENOUS
  Filled 2022-11-06: qty 50

## 2022-11-06 MED ORDER — SODIUM CHLORIDE 0.9 % IV SOLN
1000.0000 mg/m2 | Freq: Once | INTRAVENOUS | Status: AC
  Administered 2022-11-06: 2318 mg via INTRAVENOUS
  Filled 2022-11-06: qty 52.57

## 2022-11-06 MED ORDER — SODIUM CHLORIDE 0.9 % IV SOLN
150.0000 mg | Freq: Once | INTRAVENOUS | Status: AC
  Administered 2022-11-06: 150 mg via INTRAVENOUS
  Filled 2022-11-06: qty 150

## 2022-11-06 MED ORDER — SODIUM CHLORIDE 0.9 % IV SOLN
25.0000 mg/m2 | Freq: Once | INTRAVENOUS | Status: AC
  Administered 2022-11-06: 58 mg via INTRAVENOUS
  Filled 2022-11-06: qty 58

## 2022-11-06 MED ORDER — SODIUM CHLORIDE 0.9 % IV SOLN
10.0000 mg | Freq: Once | INTRAVENOUS | Status: AC
  Administered 2022-11-06: 10 mg via INTRAVENOUS
  Filled 2022-11-06: qty 10

## 2022-11-06 MED ORDER — SODIUM CHLORIDE 0.9 % IV SOLN
Freq: Once | INTRAVENOUS | Status: AC
  Filled 2022-11-06: qty 250

## 2022-11-06 NOTE — Patient Instructions (Signed)
[place] [facility]  Discharge Instructions: Thank you for choosing [facility] to provide your oncology and hematology care.  If you have a lab appointment with the Cancer Center, please go directly to the Cancer Center and check in at the registration area.  Wear comfortable clothing and clothing appropriate for easy access to any Portacath or PICC line.   We strive to give you quality time with your provider. You may need to reschedule your appointment if you arrive late (15 or more minutes).  Arriving late affects you and other patients whose appointments are after yours.  Also, if you miss three or more appointments without notifying the office, you may be dismissed from the clinic at the provider's discretion.      For prescription refill requests, have your pharmacy contact our office and allow 72 hours for refills to be completed.    Today you received the following chemotherapy and/or immunotherapy agents gemzar and cisplatin       To help prevent nausea and vomiting after your treatment, we encourage you to take your nausea medication as directed.  BELOW ARE SYMPTOMS THAT SHOULD BE REPORTED IMMEDIATELY: *FEVER GREATER THAN 100.4 F (38 C) OR HIGHER *CHILLS OR SWEATING *NAUSEA AND VOMITING THAT IS NOT CONTROLLED WITH YOUR NAUSEA MEDICATION *UNUSUAL SHORTNESS OF BREATH *UNUSUAL BRUISING OR BLEEDING *URINARY PROBLEMS (pain or burning when urinating, or frequent urination) *BOWEL PROBLEMS (unusual diarrhea, constipation, pain near the anus) TENDERNESS IN MOUTH AND THROAT WITH OR WITHOUT PRESENCE OF ULCERS (sore throat, sores in mouth, or a toothache) UNUSUAL RASH, SWELLING OR PAIN  UNUSUAL VAGINAL DISCHARGE OR ITCHING   Items with * indicate a potential emergency and should be followed up as soon as possible or go to the Emergency Department if any problems should occur.  Please show the CHEMOTHERAPY ALERT CARD or IMMUNOTHERAPY ALERT CARD at  check-in to the Emergency Department and triage nurse.  Should you have questions after your visit or need to cancel or reschedule your appointment, please contact [place] [facility]  [phone] and follow the prompts.  Office hours are 8:00 a.m. to 4:30 p.m. Monday - Friday. Please note that voicemails left after 4:00 p.m. may not be returned until the following business day.  We are closed weekends and major holidays. You have access to a nurse at all times for urgent questions. Please call the main number to the clinic [phone] and follow the prompts.  For any non-urgent questions, you may also contact your provider using MyChart. We now offer e-Visits for anyone 23 and older to request care online for non-urgent symptoms. For details visit [email or web address].   Also download the MyChart app! Go to the app store, search "MyChart", open the app, select [facility], and log in with your MyChart username and password.

## 2022-11-09 ENCOUNTER — Inpatient Hospital Stay: Payer: BC Managed Care – PPO

## 2022-11-09 DIAGNOSIS — C7801 Secondary malignant neoplasm of right lung: Secondary | ICD-10-CM

## 2022-11-09 DIAGNOSIS — Z79620 Long term (current) use of immunosuppressive biologic: Secondary | ICD-10-CM | POA: Diagnosis not present

## 2022-11-09 DIAGNOSIS — R059 Cough, unspecified: Secondary | ICD-10-CM | POA: Diagnosis not present

## 2022-11-09 DIAGNOSIS — R630 Anorexia: Secondary | ICD-10-CM | POA: Diagnosis not present

## 2022-11-09 DIAGNOSIS — I10 Essential (primary) hypertension: Secondary | ICD-10-CM | POA: Diagnosis not present

## 2022-11-09 DIAGNOSIS — C221 Intrahepatic bile duct carcinoma: Secondary | ICD-10-CM

## 2022-11-09 DIAGNOSIS — C7800 Secondary malignant neoplasm of unspecified lung: Secondary | ICD-10-CM | POA: Diagnosis not present

## 2022-11-09 DIAGNOSIS — Z5111 Encounter for antineoplastic chemotherapy: Secondary | ICD-10-CM | POA: Diagnosis not present

## 2022-11-09 DIAGNOSIS — R197 Diarrhea, unspecified: Secondary | ICD-10-CM | POA: Diagnosis not present

## 2022-11-09 DIAGNOSIS — Z79899 Other long term (current) drug therapy: Secondary | ICD-10-CM | POA: Diagnosis not present

## 2022-11-09 MED ORDER — PEGFILGRASTIM-JMDB 6 MG/0.6ML ~~LOC~~ SOSY
6.0000 mg | PREFILLED_SYRINGE | Freq: Once | SUBCUTANEOUS | Status: AC
  Administered 2022-11-09: 6 mg via SUBCUTANEOUS
  Filled 2022-11-09: qty 0.6

## 2022-11-11 ENCOUNTER — Other Ambulatory Visit: Payer: Self-pay

## 2022-11-11 ENCOUNTER — Ambulatory Visit
Admission: RE | Admit: 2022-11-11 | Discharge: 2022-11-11 | Disposition: A | Discharge status: Home / Self Care | Payer: BC Managed Care – PPO | Source: Ambulatory Visit | Attending: Internal Medicine | Admitting: Internal Medicine

## 2022-11-11 DIAGNOSIS — M1711 Unilateral primary osteoarthritis, right knee: Secondary | ICD-10-CM | POA: Diagnosis not present

## 2022-11-11 DIAGNOSIS — M79604 Pain in right leg: Secondary | ICD-10-CM | POA: Insufficient documentation

## 2022-11-11 DIAGNOSIS — M7121 Synovial cyst of popliteal space [Baker], right knee: Secondary | ICD-10-CM | POA: Diagnosis not present

## 2022-11-11 DIAGNOSIS — M25461 Effusion, right knee: Secondary | ICD-10-CM | POA: Diagnosis not present

## 2022-11-11 DIAGNOSIS — S83241A Other tear of medial meniscus, current injury, right knee, initial encounter: Secondary | ICD-10-CM | POA: Diagnosis not present

## 2022-11-11 MED ORDER — GADOBUTROL 1 MMOL/ML IV SOLN
10.0000 mL | Freq: Once | INTRAVENOUS | Status: AC | PRN
  Administered 2022-11-11: 10 mL via INTRAVENOUS

## 2022-11-12 ENCOUNTER — Ambulatory Visit: Payer: BC Managed Care – PPO

## 2022-11-19 ENCOUNTER — Inpatient Hospital Stay: Payer: BC Managed Care – PPO

## 2022-11-19 ENCOUNTER — Encounter: Payer: Self-pay | Admitting: Oncology

## 2022-11-19 ENCOUNTER — Inpatient Hospital Stay: Payer: BC Managed Care – PPO | Admitting: Oncology

## 2022-11-19 DIAGNOSIS — C221 Intrahepatic bile duct carcinoma: Secondary | ICD-10-CM

## 2022-11-19 DIAGNOSIS — Z79899 Other long term (current) drug therapy: Secondary | ICD-10-CM | POA: Diagnosis not present

## 2022-11-19 DIAGNOSIS — C7802 Secondary malignant neoplasm of left lung: Secondary | ICD-10-CM

## 2022-11-19 DIAGNOSIS — R630 Anorexia: Secondary | ICD-10-CM | POA: Diagnosis not present

## 2022-11-19 DIAGNOSIS — Z5111 Encounter for antineoplastic chemotherapy: Secondary | ICD-10-CM | POA: Diagnosis not present

## 2022-11-19 DIAGNOSIS — C7800 Secondary malignant neoplasm of unspecified lung: Secondary | ICD-10-CM | POA: Diagnosis not present

## 2022-11-19 DIAGNOSIS — I10 Essential (primary) hypertension: Secondary | ICD-10-CM | POA: Diagnosis not present

## 2022-11-19 DIAGNOSIS — C7801 Secondary malignant neoplasm of right lung: Secondary | ICD-10-CM

## 2022-11-19 DIAGNOSIS — Z79620 Long term (current) use of immunosuppressive biologic: Secondary | ICD-10-CM | POA: Diagnosis not present

## 2022-11-19 DIAGNOSIS — R197 Diarrhea, unspecified: Secondary | ICD-10-CM | POA: Diagnosis not present

## 2022-11-19 DIAGNOSIS — R059 Cough, unspecified: Secondary | ICD-10-CM | POA: Diagnosis not present

## 2022-11-19 DIAGNOSIS — Z95828 Presence of other vascular implants and grafts: Secondary | ICD-10-CM

## 2022-11-19 LAB — CMP (CANCER CENTER ONLY)
ALT: 21 U/L (ref 0–44)
AST: 26 U/L (ref 15–41)
Albumin: 3.7 g/dL (ref 3.5–5.0)
Alkaline Phosphatase: 129 U/L — ABNORMAL HIGH (ref 38–126)
Anion gap: 8 (ref 5–15)
BUN: 10 mg/dL (ref 6–20)
CO2: 23 mmol/L (ref 22–32)
Calcium: 8.6 mg/dL — ABNORMAL LOW (ref 8.9–10.3)
Chloride: 108 mmol/L (ref 98–111)
Creatinine: 0.7 mg/dL (ref 0.44–1.00)
GFR, Estimated: >60 mL/min (ref >60)
Glucose, Bld: 96 mg/dL (ref 70–99)
Potassium: 4 mmol/L (ref 3.5–5.1)
Sodium: 139 mmol/L (ref 135–145)
Total Bilirubin: 0.4 mg/dL (ref 0.3–1.2)
Total Protein: 6.5 g/dL (ref 6.5–8.1)

## 2022-11-19 LAB — CBC WITH DIFFERENTIAL (CANCER CENTER ONLY)
Abs Immature Granulocytes: 0.19 10*3/uL — ABNORMAL HIGH (ref 0.00–0.07)
Basophils Absolute: 0.1 10*3/uL (ref 0.0–0.1)
Basophils Relative: 1 %
Eosinophils Absolute: 0.1 10*3/uL (ref 0.0–0.5)
Eosinophils Relative: 1 %
HCT: 30.6 % — ABNORMAL LOW (ref 36.0–46.0)
Hemoglobin: 9.9 g/dL — ABNORMAL LOW (ref 12.0–15.0)
Immature Granulocytes: 2 %
Lymphocytes Relative: 12 %
Lymphs Abs: 1 10*3/uL (ref 0.7–4.0)
MCH: 34.1 pg — ABNORMAL HIGH (ref 26.0–34.0)
MCHC: 32.4 g/dL (ref 30.0–36.0)
MCV: 105.5 fL — ABNORMAL HIGH (ref 80.0–100.0)
Monocytes Absolute: 0.4 10*3/uL (ref 0.1–1.0)
Monocytes Relative: 5 %
Neutro Abs: 6.7 10*3/uL (ref 1.7–7.7)
Neutrophils Relative %: 79 %
Platelet Count: 169 10*3/uL (ref 150–400)
RBC: 2.9 MIL/uL — ABNORMAL LOW (ref 3.87–5.11)
RDW: 18.3 % — ABNORMAL HIGH (ref 11.5–15.5)
WBC Count: 8.5 10*3/uL (ref 4.0–10.5)
nRBC: 0.4 % — ABNORMAL HIGH (ref 0.0–0.2)

## 2022-11-19 LAB — MAGNESIUM: Magnesium: 1.8 mg/dL (ref 1.7–2.4)

## 2022-11-19 MED ORDER — HEPARIN SOD (PORK) LOCK FLUSH 100 UNIT/ML IV SOLN
500.0000 [IU] | Freq: Once | INTRAVENOUS | Status: AC
  Administered 2022-11-19: 500 [IU] via INTRAVENOUS
  Filled 2022-11-19: qty 5

## 2022-11-19 MED ORDER — SODIUM CHLORIDE 0.9% FLUSH
10.0000 mL | Freq: Once | INTRAVENOUS | Status: AC
  Administered 2022-11-19: 10 mL via INTRAVENOUS
  Filled 2022-11-19: qty 10

## 2022-11-19 MED FILL — Fosaprepitant Dimeglumine For IV Infusion 150 MG (Base Eq): INTRAVENOUS | Qty: 5 | Status: AC

## 2022-11-19 MED FILL — Dexamethasone Sodium Phosphate Inj 100 MG/10ML: INTRAMUSCULAR | Qty: 1 | Status: AC

## 2022-11-19 NOTE — Progress Notes (Signed)
[facility]  [phone] [phone]  ID: Vicki Phillips OB: 05/02/1965  MR#: [id number]  IDP#:[id number]  Patient Care Team: Reubin Milan, MD as PCP - General (Internal Medicine) Glory Buff, RN as Oncology Nurse Navigator Michaelyn Barter, MD as Consulting Physician (Oncology)  CHIEF COMPLAINT: Stage IV intrahepatic cholangiocarcinoma.  INTERVAL HISTORY: Patient returns to clinic today for consideration of cycle 4, day 1 of cisplatin, gemcitabine, and durvalumab.  She continues to tolerate her treatments well without significant side effects.  She has no neurologic complaints.  She denies any recent fevers or illnesses.  She has a good appetite and denies weight loss.  She has no chest pain, shortness of breath, cough, or hemoptysis.  She denies any nausea, vomiting, constipation, or diarrhea.  She has no urinary complaints.  Patient offers no specific complaints today.  REVIEW OF SYSTEMS:   Review of Systems  Constitutional: Negative.  Negative for fever, malaise/fatigue and weight loss.  Respiratory: Negative.  Negative for cough, hemoptysis and shortness of breath.   Cardiovascular: Negative.  Negative for chest pain and leg swelling.  Gastrointestinal: Negative.  Negative for abdominal pain.  Genitourinary: Negative.  Negative for dysuria.  Musculoskeletal: Negative.  Negative for back pain.  Skin: Negative.  Negative for rash.  Neurological: Negative.  Negative for dizziness, focal weakness, weakness and headaches.  Psychiatric/Behavioral: Negative.  The patient is not nervous/anxious.     As per HPI. Otherwise, a complete review of systems is negative.  PAST MEDICAL HISTORY: Past Medical History:  Diagnosis Date   Allergy    Sarcoidosis 2011    PAST SURGICAL HISTORY: Past Surgical History:  Procedure Laterality Date   CESAREAN SECTION     x 2   COLONOSCOPY WITH PROPOFOL N/A 08/10/2019   Procedure: COLONOSCOPY WITH  PROPOFOL;  Surgeon: Midge Minium, MD;  Location: [facility];  Service: Endoscopy;  Laterality: N/A;  priority 4   IR IMAGING GUIDED PORT INSERTION  09/11/2022   LUNG BIOPSY      FAMILY HISTORY: Family History  Problem Relation Age of Onset   Stroke Mother    Heart disease Mother    Heart attack Father 57   Heart disease Father    Kidney disease Sister     ADVANCED DIRECTIVES (Y/N):  N  HEALTH MAINTENANCE: Social History   Tobacco Use   Smoking status: Never   Smokeless tobacco: Never  Vaping Use   Vaping status: Never Used  Substance Use Topics   Alcohol use: Yes    Alcohol/week: 10.0 standard drinks of alcohol    Types: 10 Standard drinks or equivalent per week   Drug use: No     Colonoscopy:  PAP:  Bone density:  Lipid panel:  Allergies  Allergen Reactions   Other Cough    Hay fever    Current Outpatient Medications  Medication Sig Dispense Refill   amLODipine (NORVASC) 5 MG tablet TAKE 1 TABLET(5 MG) BY MOUTH DAILY 90 tablet 0   chlorpheniramine-HYDROcodone (TUSSIONEX) 10-8 MG/5ML Take 5 mLs by mouth at bedtime as needed for cough. 140 mL 0   dexamethasone (DECADRON) 4 MG tablet Take 2 tablets (8 mg) by mouth daily x 3 days starting the day after cisplatin chemotherapy. Take with food. 30 tablet 1   loratadine (CLARITIN) 10 MG tablet Take 10 mg by mouth daily.     pseudoephedrine-acetaminophen (TYLENOL SINUS) 30-500 MG TABS tablet Take 1 tablet by mouth in the morning and at bedtime.  ondansetron (ZOFRAN) 8 MG tablet Take 1 tablet (8 mg total) by mouth every 8 (eight) hours as needed for nausea or vomiting. Start on the third day after cisplatin. (Patient not taking: Reported on 11/05/2022) 30 tablet 1   prochlorperazine (COMPAZINE) 10 MG tablet Take 1 tablet (10 mg total) by mouth every 6 (six) hours as needed (Nausea or vomiting). (Patient not taking: Reported on 11/05/2022) 30 tablet 1   No current facility-administered medications for this visit.     OBJECTIVE: Vitals:   11/19/22 0906  BP: (!) 139/99  Pulse: (!) 101  Resp: 16  Temp: (!) 97.4 F (36.3 C)  SpO2: 99%     Body mass index is 35.74 kg/m.    ECOG FS:0 - Asymptomatic  General: Well-developed, well-nourished, no acute distress. Eyes: Pink conjunctiva, anicteric sclera. HEENT: Normocephalic, moist mucous membranes. Lungs: No audible wheezing or coughing. Heart: Regular rate and rhythm. Abdomen: Soft, nontender, no obvious distention. Musculoskeletal: No edema, cyanosis, or clubbing. Neuro: Alert, answering all questions appropriately. Cranial nerves grossly intact. Skin: No rashes or petechiae noted. Psych: Normal affect. Lymphatics: No cervical, calvicular, axillary or inguinal LAD.   LAB RESULTS:  Lab Results  Component Value Date   NA 139 11/19/2022   K 4.0 11/19/2022   CL 108 11/19/2022   CO2 23 11/19/2022   GLUCOSE 96 11/19/2022   BUN 10 11/19/2022   CREATININE 0.70 11/19/2022   CALCIUM 8.6 (L) 11/19/2022   PROT 6.5 11/19/2022   ALBUMIN 3.7 11/19/2022   AST 26 11/19/2022   ALT 21 11/19/2022   ALKPHOS 129 (H) 11/19/2022   BILITOT 0.4 11/19/2022   GFRNONAA >60 11/19/2022   GFRAA 108 07/31/2019    Lab Results  Component Value Date   WBC 8.5 11/19/2022   NEUTROABS 6.7 11/19/2022   HGB 9.9 (L) 11/19/2022   HCT 30.6 (L) 11/19/2022   MCV 105.5 (H) 11/19/2022   PLT 169 11/19/2022     STUDIES: US Venous Img Lower Unilateral Right  Result Date: 10/30/2022 CLINICAL DATA:  Right lower pain for the past 2 weeks. Evaluate for DVT. EXAM: RIGHT LOWER EXTREMITY VENOUS DOPPLER ULTRASOUND TECHNIQUE: Gray-scale sonography with graded compression, as well as color Doppler and duplex ultrasound were performed to evaluate the lower extremity deep venous systems from the level of the common femoral vein and including the common femoral, femoral, profunda femoral, popliteal and calf veins including the posterior tibial, peroneal and gastrocnemius veins when  visible. The superficial great saphenous vein was also interrogated. Spectral Doppler was utilized to evaluate flow at rest and with distal augmentation maneuvers in the common femoral, femoral and popliteal veins. COMPARISON:  None Available. FINDINGS: Contralateral Common Femoral Vein: Respiratory phasicity is normal and symmetric with the symptomatic side. No evidence of thrombus. Normal compressibility. Common Femoral Vein: No evidence of thrombus. Normal compressibility, respiratory phasicity and response to augmentation. Saphenofemoral Junction: No evidence of thrombus. Normal compressibility and flow on color Doppler imaging. Profunda Femoral Vein: No evidence of thrombus. Normal compressibility and flow on color Doppler imaging. Femoral Vein: No evidence of thrombus. Normal compressibility, respiratory phasicity and response to augmentation. Popliteal Vein: No evidence of thrombus. Normal compressibility, respiratory phasicity and response to augmentation. Calf Veins: No evidence of thrombus. Normal compressibility and flow on color Doppler imaging. Superficial Great Saphenous Vein: No evidence of thrombus. Normal compressibility. Other Findings: Sonographic evaluation of patient's area of discomfort involving the medial aspect of the right knee correlates with serpiginous fluid collection, nonspecific though potentially representative of  a bursal cyst. No blood flow is demonstrated within this apparent fluid collection. IMPRESSION: 1. No evidence of DVT within the right lower extremity. 2. Sonographic evaluation of the patient's area of discomfort involving the medial aspect of the right knee correlates with a serpiginous fluid collection, nonspecific though potentially representative of a bursal cyst. Further evaluation with the MRI could performed as indicated. Electronically Signed   By: Simonne Come M.D.   On: 10/30/2022 16:58   DG Chest 2 View  Result Date: 10/29/2022 CLINICAL DATA:  Metastatic  cholangiocarcinoma EXAM: CHEST - 2 VIEW COMPARISON:  10/14/2021; chest CT-08/18/2022 PET-CT-08/25/2022 FINDINGS: Grossly unchanged cardiac silhouette and mediastinal contours. Stable position of support apparatus. Redemonstrated innumerable bilateral pulmonary nodules compatible with diffuse metastatic disease. No discrete focal airspace opacities. No pleural effusion or pneumothorax. No evidence of edema. No acute osseous abnormalities. IMPRESSION: 1. Redemonstrated innumerable bilateral pulmonary nodules compatible with diffuse metastatic disease. 2. No acute cardiopulmonary disease. Electronically Signed   By: Simonne Come M.D.   On: 10/29/2022 11:21    ASSESSMENT: Stage IV intrahepatic cholangiocarcinoma.  PLAN:    Stage IV intrahepatic cholangiocarcinoma: Proceed with cycle 4, day 1 of cisplatin, gemcitabine, and durvalumab tomorrow.  Patient return to clinic in 1 week for consideration of cycle 4, day 8 which is cisplatin and gemcitabine only.  Patient has a restaging CT scan scheduled for November 30, 2022. Anemia: Patient's hemoglobin has trended down slightly to 9.9, monitor. Right knee pain: Patient does not complain of this today. Hypertension: Patient's blood pressure is moderately elevated today.  Continue current treatment as prescribed. Diarrhea: Patient does not complain of this today.  Continue Imodium as needed. History of sarcoidosis: Patient has not required treatment for this.  Continue follow-up with pulmonary as indicated.  Patient expressed understanding and was in agreement with this plan. She also understands that She can call clinic at any time with any questions, concerns, or complaints.    Cancer Staging  Cholangiocarcinoma [facility]) Staging form: Intrahepatic Bile Duct, AJCC 8th Edition - Clinical stage from 08/26/2022: Stage IV (cM1) - Signed by Michaelyn Barter, MD on 09/03/2022 Stage prefix: Initial diagnosis   Jeralyn Ruths, MD   11/19/2022 10:47 AM

## 2022-11-20 ENCOUNTER — Inpatient Hospital Stay: Payer: BC Managed Care – PPO

## 2022-11-20 VITALS — BP 149/90 | HR 99 | Temp 96.3°F | Resp 20

## 2022-11-20 DIAGNOSIS — R630 Anorexia: Secondary | ICD-10-CM | POA: Diagnosis not present

## 2022-11-20 DIAGNOSIS — C7800 Secondary malignant neoplasm of unspecified lung: Secondary | ICD-10-CM | POA: Diagnosis not present

## 2022-11-20 DIAGNOSIS — Z79899 Other long term (current) drug therapy: Secondary | ICD-10-CM | POA: Diagnosis not present

## 2022-11-20 DIAGNOSIS — R059 Cough, unspecified: Secondary | ICD-10-CM | POA: Diagnosis not present

## 2022-11-20 DIAGNOSIS — Z79620 Long term (current) use of immunosuppressive biologic: Secondary | ICD-10-CM | POA: Diagnosis not present

## 2022-11-20 DIAGNOSIS — C221 Intrahepatic bile duct carcinoma: Secondary | ICD-10-CM | POA: Diagnosis not present

## 2022-11-20 DIAGNOSIS — R197 Diarrhea, unspecified: Secondary | ICD-10-CM | POA: Diagnosis not present

## 2022-11-20 DIAGNOSIS — Z5111 Encounter for antineoplastic chemotherapy: Secondary | ICD-10-CM | POA: Diagnosis not present

## 2022-11-20 DIAGNOSIS — C7801 Secondary malignant neoplasm of right lung: Secondary | ICD-10-CM

## 2022-11-20 DIAGNOSIS — I10 Essential (primary) hypertension: Secondary | ICD-10-CM | POA: Diagnosis not present

## 2022-11-20 LAB — CANCER ANTIGEN 19-9: CA 19-9: 130 U/mL — ABNORMAL HIGH (ref 0–35)

## 2022-11-20 MED ORDER — SODIUM CHLORIDE 0.9 % IV SOLN
25.0000 mg/m2 | Freq: Once | INTRAVENOUS | Status: AC
  Administered 2022-11-20: 58 mg via INTRAVENOUS
  Filled 2022-11-20: qty 58

## 2022-11-20 MED ORDER — PALONOSETRON HCL INJECTION 0.25 MG/5ML
0.2500 mg | Freq: Once | INTRAVENOUS | Status: AC
  Administered 2022-11-20: 0.25 mg via INTRAVENOUS
  Filled 2022-11-20: qty 5

## 2022-11-20 MED ORDER — SODIUM CHLORIDE 0.9 % IV SOLN
Freq: Once | INTRAVENOUS | Status: AC
  Filled 2022-11-20: qty 250

## 2022-11-20 MED ORDER — SODIUM CHLORIDE 0.9 % IV SOLN
1000.0000 mg/m2 | Freq: Once | INTRAVENOUS | Status: AC
  Administered 2022-11-20: 2318 mg via INTRAVENOUS
  Filled 2022-11-20: qty 52.57

## 2022-11-20 MED ORDER — POTASSIUM CHLORIDE IN NACL 20-0.9 MEQ/L-% IV SOLN
Freq: Once | INTRAVENOUS | Status: AC
  Filled 2022-11-20: qty 1000

## 2022-11-20 MED ORDER — MAGNESIUM SULFATE 2 GM/50ML IV SOLN
2.0000 g | Freq: Once | INTRAVENOUS | Status: AC
  Administered 2022-11-20: 2 g via INTRAVENOUS
  Filled 2022-11-20: qty 50

## 2022-11-20 MED ORDER — HEPARIN SOD (PORK) LOCK FLUSH 100 UNIT/ML IV SOLN
500.0000 [IU] | Freq: Once | INTRAVENOUS | Status: AC | PRN
  Administered 2022-11-20: 500 [IU]
  Filled 2022-11-20: qty 5

## 2022-11-20 MED ORDER — SODIUM CHLORIDE 0.9 % IV SOLN
10.0000 mg | Freq: Once | INTRAVENOUS | Status: AC
  Administered 2022-11-20: 10 mg via INTRAVENOUS
  Filled 2022-11-20: qty 10

## 2022-11-20 MED ORDER — SODIUM CHLORIDE 0.9 % IV SOLN
1500.0000 mg | Freq: Once | INTRAVENOUS | Status: AC
  Administered 2022-11-20: 1500 mg via INTRAVENOUS
  Filled 2022-11-20: qty 30

## 2022-11-20 MED ORDER — SODIUM CHLORIDE 0.9% FLUSH
10.0000 mL | INTRAVENOUS | Status: DC | PRN
  Administered 2022-11-20: 10 mL
  Filled 2022-11-20: qty 10

## 2022-11-20 MED ORDER — SODIUM CHLORIDE 0.9 % IV SOLN
150.0000 mg | Freq: Once | INTRAVENOUS | Status: AC
  Administered 2022-11-20: 150 mg via INTRAVENOUS
  Filled 2022-11-20: qty 150

## 2022-11-20 NOTE — Progress Notes (Signed)
1345: MD okay with running post fluids with Cisplatin.

## 2022-11-26 ENCOUNTER — Inpatient Hospital Stay: Payer: BC Managed Care – PPO | Attending: Internal Medicine

## 2022-11-26 ENCOUNTER — Inpatient Hospital Stay (HOSPITAL_BASED_OUTPATIENT_CLINIC_OR_DEPARTMENT_OTHER): Payer: BC Managed Care – PPO | Admitting: Internal Medicine

## 2022-11-26 VITALS — BP 127/90 | HR 90 | Temp 98.6°F | Wt 241.0 lb

## 2022-11-26 DIAGNOSIS — Z79899 Other long term (current) drug therapy: Secondary | ICD-10-CM | POA: Insufficient documentation

## 2022-11-26 DIAGNOSIS — C221 Intrahepatic bile duct carcinoma: Secondary | ICD-10-CM | POA: Insufficient documentation

## 2022-11-26 DIAGNOSIS — C7802 Secondary malignant neoplasm of left lung: Secondary | ICD-10-CM | POA: Insufficient documentation

## 2022-11-26 DIAGNOSIS — Z7952 Long term (current) use of systemic steroids: Secondary | ICD-10-CM | POA: Diagnosis not present

## 2022-11-26 DIAGNOSIS — Z5112 Encounter for antineoplastic immunotherapy: Secondary | ICD-10-CM | POA: Diagnosis not present

## 2022-11-26 DIAGNOSIS — C7801 Secondary malignant neoplasm of right lung: Secondary | ICD-10-CM | POA: Diagnosis not present

## 2022-11-26 DIAGNOSIS — I10 Essential (primary) hypertension: Secondary | ICD-10-CM | POA: Insufficient documentation

## 2022-11-26 DIAGNOSIS — M25561 Pain in right knee: Secondary | ICD-10-CM | POA: Diagnosis not present

## 2022-11-26 DIAGNOSIS — T451X5A Adverse effect of antineoplastic and immunosuppressive drugs, initial encounter: Secondary | ICD-10-CM | POA: Diagnosis not present

## 2022-11-26 DIAGNOSIS — Z5111 Encounter for antineoplastic chemotherapy: Secondary | ICD-10-CM | POA: Diagnosis not present

## 2022-11-26 DIAGNOSIS — D6481 Anemia due to antineoplastic chemotherapy: Secondary | ICD-10-CM | POA: Insufficient documentation

## 2022-11-26 DIAGNOSIS — C7951 Secondary malignant neoplasm of bone: Secondary | ICD-10-CM | POA: Insufficient documentation

## 2022-11-26 LAB — CBC WITH DIFFERENTIAL (CANCER CENTER ONLY)
Abs Immature Granulocytes: 0.01 10*3/uL (ref 0.00–0.07)
Basophils Absolute: 0 10*3/uL (ref 0.0–0.1)
Basophils Relative: 1 %
Eosinophils Absolute: 0 10*3/uL (ref 0.0–0.5)
Eosinophils Relative: 1 %
HCT: 27 % — ABNORMAL LOW (ref 36.0–46.0)
Hemoglobin: 9.1 g/dL — ABNORMAL LOW (ref 12.0–15.0)
Immature Granulocytes: 1 %
Lymphocytes Relative: 28 %
Lymphs Abs: 0.6 10*3/uL — ABNORMAL LOW (ref 0.7–4.0)
MCH: 35.1 pg — ABNORMAL HIGH (ref 26.0–34.0)
MCHC: 33.7 g/dL (ref 30.0–36.0)
MCV: 104.2 fL — ABNORMAL HIGH (ref 80.0–100.0)
Monocytes Absolute: 0.2 10*3/uL (ref 0.1–1.0)
Monocytes Relative: 9 %
Neutro Abs: 1.3 10*3/uL — ABNORMAL LOW (ref 1.7–7.7)
Neutrophils Relative %: 60 %
Platelet Count: 206 10*3/uL (ref 150–400)
RBC: 2.59 MIL/uL — ABNORMAL LOW (ref 3.87–5.11)
RDW: 17.5 % — ABNORMAL HIGH (ref 11.5–15.5)
WBC Count: 2 10*3/uL — ABNORMAL LOW (ref 4.0–10.5)
nRBC: 0 % (ref 0.0–0.2)

## 2022-11-26 LAB — MAGNESIUM: Magnesium: 1.8 mg/dL (ref 1.7–2.4)

## 2022-11-26 LAB — CMP (CANCER CENTER ONLY)
ALT: 92 U/L — ABNORMAL HIGH (ref 0–44)
AST: 42 U/L — ABNORMAL HIGH (ref 15–41)
Albumin: 3.7 g/dL (ref 3.5–5.0)
Alkaline Phosphatase: 97 U/L (ref 38–126)
Anion gap: 7 (ref 5–15)
BUN: 14 mg/dL (ref 6–20)
CO2: 25 mmol/L (ref 22–32)
Calcium: 8.9 mg/dL (ref 8.9–10.3)
Chloride: 101 mmol/L (ref 98–111)
Creatinine: 0.63 mg/dL (ref 0.44–1.00)
GFR, Estimated: >60 mL/min (ref >60)
Glucose, Bld: 104 mg/dL — ABNORMAL HIGH (ref 70–99)
Potassium: 3.7 mmol/L (ref 3.5–5.1)
Sodium: 133 mmol/L — ABNORMAL LOW (ref 135–145)
Total Bilirubin: 0.8 mg/dL (ref 0.3–1.2)
Total Protein: 6.8 g/dL (ref 6.5–8.1)

## 2022-11-26 MED FILL — Fosaprepitant Dimeglumine For IV Infusion 150 MG (Base Eq): INTRAVENOUS | Qty: 5 | Status: AC

## 2022-11-26 MED FILL — Dexamethasone Sodium Phosphate Inj 100 MG/10ML: INTRAMUSCULAR | Qty: 1 | Status: AC

## 2022-11-26 NOTE — Progress Notes (Signed)
Patient says that last night she didn't want to do anything but sleep, about 2 days ago she started having some shortness of breath. Plus last night she had some diarrhea.

## 2022-11-26 NOTE — Progress Notes (Signed)
Cancer Center CONSULT NOTE  Patient Care Team: Reubin Milan, MD as PCP - General (Internal Medicine) Glory Buff, RN as Oncology Nurse Navigator Michaelyn Barter, MD as Consulting Physician (Oncology)   CANCER STAGING   Cancer Staging  Cholangiocarcinoma [facility]) Staging form: Intrahepatic Bile Duct, AJCC 8th Edition - Clinical stage from 08/26/2022: Stage IV (cM1) - Signed by Michaelyn Barter, MD on 09/03/2022 Stage prefix: Initial diagnosis   ASSESSMENT & PLAN:  Vicki Phillips 57 y.o. 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  # Cholangiocarcinoma, stage IV -PET/CT from 7-24 showed large tumor burden with numerous pulmonary nodules bilaterally.  Index lesion measuring 2 cm in the left upper lobe SUV 8.3, subpleural lateral left lower lobe measuring 1.5 cm SUV 10.2, posterior right base 2 cm SUV 5.5, left supraclavicular lymph node 8 mm SUV 4.5, numerous liver masses, dominant lesion 8 cm in segment 4B SUV 12.7 with additional lesions in segment 5 6 and 4a, multiple abdominal lymph nodes are identified.  Pancreas looks normal.  Tracer avid sclerotic lesion within T3 SUV 4.5.  - s/p liver biopsy on 08/26/2022 showed metastatic moderately differentiated adenocarcinoma.  IHC stain positive for CK7 and CK20.  TTF-1, PAX8, GATA3 and CDX2 are negative.  Differentials include cholangiocarcinoma and pancreatic carcinoma.  Pancreatic cancer is less likely since it looks normal on the imaging.   Plan- -patient sought second opinion with Dr. Hulen Luster at [facility] who agreed with the treatment plan.  Plan for follow-up in 3 months.  -Labs reviewed and acceptable for treatment.  Will proceed with cycle 4-day 8 of gemcitabine and cisplatin.  Scheduled for CT chest abdomen pelvis for restaging next week.  G-CSF injection on Monday. -PD-L1 0%.  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  Full report  below. -Pretreatment CA 19-9 352.  CA 19-9 trending down to 130.  # Right knee pain -Venous Dopplers showing possible right bursal cyst.  No clot.   -MRI right knee Showing horizontal tear in the posterior horn median meniscus and moderate-sized Baker's cyst with suspected leak or rupture, tricompartmental osteoarthritis.  Orthopedic referral was placed last visit.  Advised the staff to follow-up on it.  # Hypertension -Continue with amlodipine 5 mg daily.  Blood pressure improved  # Cough -Continue with Tussionex as needed.  # Diarrhea -GI panel and C. difficile was negative previously. -Had multiple episodes of diarrhea yesterday.  No episodes today.  Patient was advised if she has diarrhea again would like to recheck the stool panel.  # Poor appetite -Discussed about nutrition consult and appetite stimulant.  Would like to hold off  # History of sarcoidosis -Management per pulmonary -Never required treatment.  Orders Placed This Encounter  Procedures   CBC with Differential (Cancer Center Only)    Standing Status:   Future    Standing Expiration Date:   12/10/2023   CMP (Cancer Center only)    Standing Status:   Future    Standing Expiration Date:   12/10/2023   Magnesium    Standing Status:   Future    Standing Expiration Date:   12/10/2023   CBC with Differential (Cancer Center Only)    Standing Status:   Future    Standing Expiration Date:   12/17/2023   CMP (Cancer Center only)    Standing Status:   Future    Standing Expiration Date:   12/17/2023   Magnesium    Standing  Status:   Future    Standing Expiration Date:   12/17/2023   RTC in 2 weeks for MD visit, labs, cycle 1 of cisplatin, gemcitabine and durvalumab  The total time spent in the appointment was 30 minutes encounter with patients including review of chart and various tests results, discussions about plan of care and coordination of care plan   All questions were answered. The patient knows to call  the clinic with any problems, questions or concerns. No barriers to learning was detected.  Michaelyn Barter, MD 10/3/202412:28 PM   HISTORY OF PRESENTING ILLNESS:  Vicki Phillips 57 y.o. 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  Patient reports she presented to ED with complaints of chest pain.  Had chest x-ray which showed pulmonary nodules was referred to pulmonary.  Records not available.  Was then seen by Dr. Aundria Rud on 08/05/2022.  CT chest showed innumerable pulmonary nodules of varying sizes throughout the lung.  Largest spiculated appearing nodule of anterior right upper lobe measuring 2 x 2 cm, at least 1 ill-defined hypodense lesion of the anterior left lobe of the liver.  Despite stated history of sarcoidosis, there are no specific or characteristic features of sarcoidosis with respect to distribution, morphology, associated fibrotic change, nor associated mediastinal lymphadenopathy. 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  She has history of sarcoidosis but was never on treatment.  Reports that she does not see doctors on a regular basis because she is had bad experiences.  Interval history Patient seen today prior to cycle 4-day 8 of cisplatin and gemcitabine, Durvalumab Patient was feeling emotional today.  She has not been able to sleep for couple of days.  Yesterday she had multiple episodes of diarrhea.  No episodes so far today.  Denies any neuropathy.  Has muffled sensation in the left ear but having ongoing sinus issues.  Appetite is okay.  Right knee pain is on and off.  With treatment it resolves and then it comes back.  I have reviewed her chart and materials related to her cancer extensively and collaborated history with the  patient. Summary of oncologic history is as follows: Oncology History  Cholangiocarcinoma (HCC)  08/26/2022 Cancer Staging   Staging form: Intrahepatic Bile Duct, AJCC 8th Edition - Clinical stage from 08/26/2022: Stage IV (cM1) - Signed by Michaelyn Barter, MD on 09/03/2022 Stage prefix: Initial diagnosis   09/03/2022 Initial Diagnosis   Cholangiocarcinoma (HCC)   09/18/2022 -  Chemotherapy   Patient is on Treatment Plan : BILIARY TRACT Cisplatin + Gemcitabine D1,8 + Durvalumab (1500) D1 q21d / Durvalumab (1500) q28d     Metastasis to lung (HCC)  09/03/2022 Initial Diagnosis   Metastasis to lung (HCC)   09/18/2022 -  Chemotherapy   Patient is on Treatment Plan : BILIARY TRACT Cisplatin + Gemcitabine D1,8 + Durvalumab (1500) D1 q21d / Durvalumab (1500) q28d       MEDICAL HISTORY:  Past Medical History:  Diagnosis Date   Allergy    Sarcoidosis 2011    SURGICAL HISTORY: Past Surgical History:  Procedure Laterality Date   CESAREAN SECTION     x 2   COLONOSCOPY WITH PROPOFOL N/A 08/10/2019   Procedure: COLONOSCOPY WITH PROPOFOL;  Surgeon: Midge Minium, MD;  Location: [facility];  Service: Endoscopy;  Laterality: N/A;  priority 4   IR IMAGING GUIDED PORT INSERTION  09/11/2022   LUNG BIOPSY      SOCIAL HISTORY: Social History   Socioeconomic History   Marital status: Single    Spouse name: Not on file   Number of children: 2   Years of education: Not on file   Highest education level: Not on file  Occupational History   Not on file  Tobacco Use   Smoking status: Never   Smokeless tobacco: Never  Vaping Use   Vaping status: Never Used  Substance and Sexual Activity   Alcohol use: Yes    Alcohol/week: 10.0 standard drinks of alcohol    Types: 10 Standard drinks or equivalent per week   Drug use: No   Sexual activity: Yes    Partners: Male    Birth control/protection: Surgical  Other Topics Concern   Not on file  Social History Narrative   Not on file    Social Determinants of Health   Financial Resource Strain: Low Risk  (09/18/2022)   Overall Financial Resource Strain (CARDIA)    Difficulty of Paying Living Expenses: Not hard at all  Food Insecurity: No Food Insecurity (08/24/2022)   Hunger Vital Sign    Worried About Running Out of Food in the Last Year: Never true    Ran Out of Food in the Last Year: Never true  Transportation Needs: No Transportation Needs (08/24/2022)   PRAPARE - Administrator, Civil Service (Medical): No    Lack of Transportation (Non-Medical): No  Physical Activity: Not on file  Stress: Not on file  Social Connections: Not on file  Intimate Partner Violence: Not At Risk (08/24/2022)   Humiliation, Afraid, Rape, and Kick questionnaire    Fear of Current or Ex-Partner: No    Emotionally Abused: No    Physically Abused: No    Sexually Abused: No    FAMILY HISTORY: Family History  Problem Relation Age of Onset   Stroke Mother    Heart disease Mother    Heart attack Father 73   Heart disease Father    Kidney disease Sister     ALLERGIES:  is allergic to other.  MEDICATIONS:  Current Outpatient Medications  Medication Sig Dispense Refill   amLODipine (NORVASC) 5 MG tablet TAKE 1 TABLET(5 MG) BY MOUTH DAILY 90 tablet 0   chlorpheniramine-HYDROcodone (TUSSIONEX) 10-8 MG/5ML Take 5 mLs by mouth at bedtime as needed for cough. 140 mL 0   dexamethasone (DECADRON) 4 MG tablet Take 2 tablets (8 mg) by mouth daily x 3 days starting the day after cisplatin chemotherapy. Take with food. 30 tablet 1   loratadine (CLARITIN) 10 MG tablet Take 10 mg by mouth daily.     pseudoephedrine-acetaminophen (TYLENOL SINUS) 30-500 MG TABS tablet Take 1 tablet by mouth in the morning and at bedtime.     ondansetron (ZOFRAN) 8 MG tablet Take 1 tablet (8 mg total) by mouth every 8 (eight) hours as needed for nausea or vomiting. Start on the third day after cisplatin. (Patient not taking: Reported on 11/26/2022) 30 tablet 1    prochlorperazine (COMPAZINE) 10 MG tablet Take 1 tablet (10 mg total) by mouth every 6 (six) hours as needed (Nausea or vomiting). (Patient not taking: Reported on 11/05/2022) 30 tablet 1   No current facility-administered medications for this visit.    REVIEW OF SYSTEMS:   Pertinent information mentioned in HPI All other systems were reviewed with the patient and are negative.  PHYSICAL EXAMINATION: ECOG PERFORMANCE STATUS: 1 - Symptomatic but completely ambulatory  Vitals:   11/26/22 0913  BP: (!) 127/90  Pulse: 90  Temp: 98.6 F (37 C)  SpO2: 100%    Filed Weights   11/26/22 0913  Weight: 241 lb (109.3 kg)     GENERAL:alert, no distress and comfortable SKIN: skin color, texture, turgor are normal, no rashes or significant lesions EYES: normal, conjunctiva are pink and non-injected, sclera clear OROPHARYNX:no exudate, no erythema and lips, buccal mucosa, and tongue normal  NECK: supple, thyroid normal size, non-tender, without nodularity LYMPH:  no palpable lymphadenopathy in the cervical, axillary or inguinal LUNGS: clear to auscultation and percussion with normal breathing effort HEART: regular rate & rhythm and no murmurs and no lower extremity edema ABDOMEN:abdomen soft, non-tender and normal bowel sounds Musculoskeletal:no cyanosis of digits and no clubbing  PSYCH: alert & oriented x 3 with fluent speech NEURO: no focal motor/sensory deficits  LABORATORY DATA:  I have reviewed the data as listed Lab Results  Component Value Date   WBC 2.0 (L) 11/26/2022   HGB 9.1 (L) 11/26/2022   HCT 27.0 (L) 11/26/2022   MCV 104.2 (H) 11/26/2022   PLT 206 11/26/2022   Recent Labs    11/05/22 0838 11/19/22 0851 11/26/22 0856  NA 134* 139 133*  K 3.9 4.0 3.7  CL 103 108 101  CO2 24 23 25   GLUCOSE 106* 96 104*  BUN 13 10 14   CREATININE 0.68 0.70 0.63  CALCIUM 9.0 8.6* 8.9  GFRNONAA >60 >60 >60  PROT 6.7 6.5 6.8  ALBUMIN 3.8 3.7 3.7  AST 30 26 42*  ALT 61* 21  92*  ALKPHOS 112 129* 97  BILITOT 0.7 0.4 0.8    RADIOGRAPHIC STUDIES: I have personally reviewed the radiological images as listed and agreed with the findings in the report. MR KNEE RIGHT W WO CONTRAST  Result Date: 11/26/2022 CLINICAL DATA:  Knee pain.  Baker's cyst on ultrasound. EXAM: MRI OF THE RIGHT KNEE WITHOUT AND WITH CONTRAST TECHNIQUE: Multiplanar, multisequence MR imaging of the knee was performed before and after IV contrast administration. COMPARISON:  Lower extremity ultrasound of 10/30/2022 CONTRAST:  CONTRAST 10 cc Gadavist FINDINGS: MENISCI Medial meniscus: Horizontal tear in the posterior horn appears to involve the free edge and inferior surface on images 19 through 21 of series 12. Lateral meniscus:  Unremarkable LIGAMENTS Cruciates:  Unremarkable Collaterals:  Unremarkable CARTILAGE Patellofemoral: Chondral thinning and irregularity inferiorly along the medial patellar facet on image 17 series 12. Inferior patellar marginal spurring. Mild chondral focal thinning and irregularity along portions of the femoral trochlear groove as on image 14 series 12. Medial: Marginal spurring. Moderate chondral thinning and irregularity. Lateral: Marginal spurring. Chondral fissuring and chondral thinning posteriorly along the lateral femoral condyle. Joint:  Trace knee effusion. Popliteal Fossa: Moderate size Baker's cyst with suspected leak or rupture especially extending cephalad along the posteromedial knee. No abnormal enhancement in this vicinity. Extensor Mechanism:  Unremarkable Bones: No significant extra-articular osseous abnormalities identified. Other: No supplemental non-categorized findings. IMPRESSION: 1. Horizontal tear in the posterior horn medial meniscus. 2. Moderate size Baker's cyst with suspected leak or rupture especially extending cephalad along the posteromedial knee. No abnormal enhancement in this vicinity. 3. Trace knee effusion. 4. Tricompartmental osteoarthritis.  Electronically Signed   By: Gaylyn Rong M.D.   On: 11/26/2022 08:52   US Venous Img Lower Unilateral Right  Result Date: 10/30/2022 CLINICAL DATA:  Right lower pain for the past 2 weeks.  Evaluate for DVT. EXAM: RIGHT LOWER EXTREMITY VENOUS DOPPLER ULTRASOUND TECHNIQUE: Gray-scale sonography with graded compression, as well as color Doppler and duplex ultrasound were performed to evaluate the lower extremity deep venous systems from the level of the common femoral vein and including the common femoral, femoral, profunda femoral, popliteal and calf veins including the posterior tibial, peroneal and gastrocnemius veins when visible. The superficial great saphenous vein was also interrogated. Spectral Doppler was utilized to evaluate flow at rest and with distal augmentation maneuvers in the common femoral, femoral and popliteal veins. COMPARISON:  None Available. FINDINGS: Contralateral Common Femoral Vein: Respiratory phasicity is normal and symmetric with the symptomatic side. No evidence of thrombus. Normal compressibility. Common Femoral Vein: No evidence of thrombus. Normal compressibility, respiratory phasicity and response to augmentation. Saphenofemoral Junction: No evidence of thrombus. Normal compressibility and flow on color Doppler imaging. Profunda Femoral Vein: No evidence of thrombus. Normal compressibility and flow on color Doppler imaging. Femoral Vein: No evidence of thrombus. Normal compressibility, respiratory phasicity and response to augmentation. Popliteal Vein: No evidence of thrombus. Normal compressibility, respiratory phasicity and response to augmentation. Calf Veins: No evidence of thrombus. Normal compressibility and flow on color Doppler imaging. Superficial Great Saphenous Vein: No evidence of thrombus. Normal compressibility. Other Findings: Sonographic evaluation of patient's area of discomfort involving the medial aspect of the right knee correlates with serpiginous fluid  collection, nonspecific though potentially representative of a bursal cyst. No blood flow is demonstrated within this apparent fluid collection. IMPRESSION: 1. No evidence of DVT within the right lower extremity. 2. Sonographic evaluation of the patient's area of discomfort involving the medial aspect of the right knee correlates with a serpiginous fluid collection, nonspecific though potentially representative of a bursal cyst. Further evaluation with the MRI could performed as indicated. Electronically Signed   By: Simonne Come M.D.   On: 10/30/2022 16:58   DG Chest 2 View  Result Date: 10/29/2022 CLINICAL DATA:  Metastatic cholangiocarcinoma EXAM: CHEST - 2 VIEW COMPARISON:  10/14/2021; chest CT-08/18/2022 PET-CT-08/25/2022 FINDINGS: Grossly unchanged cardiac silhouette and mediastinal contours. Stable position of support apparatus. Redemonstrated innumerable bilateral pulmonary nodules compatible with diffuse metastatic disease. No discrete focal airspace opacities. No pleural effusion or pneumothorax. No evidence of edema. No acute osseous abnormalities. IMPRESSION: 1. Redemonstrated innumerable bilateral pulmonary nodules compatible with diffuse metastatic disease. 2. No acute cardiopulmonary disease. Electronically Signed   By: Simonne Come M.D.   On: 10/29/2022 11:21

## 2022-11-27 ENCOUNTER — Inpatient Hospital Stay: Payer: BC Managed Care – PPO

## 2022-11-27 ENCOUNTER — Other Ambulatory Visit: Payer: Self-pay

## 2022-11-27 VITALS — BP 141/91 | HR 95 | Temp 97.6°F | Resp 16

## 2022-11-27 DIAGNOSIS — Z7952 Long term (current) use of systemic steroids: Secondary | ICD-10-CM | POA: Diagnosis not present

## 2022-11-27 DIAGNOSIS — C221 Intrahepatic bile duct carcinoma: Secondary | ICD-10-CM | POA: Diagnosis not present

## 2022-11-27 DIAGNOSIS — C7801 Secondary malignant neoplasm of right lung: Secondary | ICD-10-CM | POA: Diagnosis not present

## 2022-11-27 DIAGNOSIS — C7802 Secondary malignant neoplasm of left lung: Secondary | ICD-10-CM | POA: Diagnosis not present

## 2022-11-27 DIAGNOSIS — I10 Essential (primary) hypertension: Secondary | ICD-10-CM | POA: Diagnosis not present

## 2022-11-27 DIAGNOSIS — Z5112 Encounter for antineoplastic immunotherapy: Secondary | ICD-10-CM | POA: Diagnosis not present

## 2022-11-27 DIAGNOSIS — D6481 Anemia due to antineoplastic chemotherapy: Secondary | ICD-10-CM | POA: Diagnosis not present

## 2022-11-27 DIAGNOSIS — Z5111 Encounter for antineoplastic chemotherapy: Secondary | ICD-10-CM | POA: Diagnosis not present

## 2022-11-27 DIAGNOSIS — Z79899 Other long term (current) drug therapy: Secondary | ICD-10-CM | POA: Diagnosis not present

## 2022-11-27 DIAGNOSIS — T451X5A Adverse effect of antineoplastic and immunosuppressive drugs, initial encounter: Secondary | ICD-10-CM | POA: Diagnosis not present

## 2022-11-27 DIAGNOSIS — C7951 Secondary malignant neoplasm of bone: Secondary | ICD-10-CM | POA: Diagnosis not present

## 2022-11-27 MED ORDER — SODIUM CHLORIDE 0.9 % IV SOLN
150.0000 mg | Freq: Once | INTRAVENOUS | Status: AC
  Administered 2022-11-27: 150 mg via INTRAVENOUS
  Filled 2022-11-27: qty 150

## 2022-11-27 MED ORDER — SODIUM CHLORIDE 0.9 % IV SOLN
Freq: Once | INTRAVENOUS | Status: AC
  Filled 2022-11-27: qty 250

## 2022-11-27 MED ORDER — SODIUM CHLORIDE 0.9 % IV SOLN
25.0000 mg/m2 | Freq: Once | INTRAVENOUS | Status: AC
  Administered 2022-11-27: 58 mg via INTRAVENOUS
  Filled 2022-11-27: qty 48.28

## 2022-11-27 MED ORDER — PALONOSETRON HCL INJECTION 0.25 MG/5ML
0.2500 mg | Freq: Once | INTRAVENOUS | Status: AC
  Administered 2022-11-27: 0.25 mg via INTRAVENOUS
  Filled 2022-11-27: qty 5

## 2022-11-27 MED ORDER — SODIUM CHLORIDE 0.9 % IV SOLN
10.0000 mg | Freq: Once | INTRAVENOUS | Status: AC
  Administered 2022-11-27: 10 mg via INTRAVENOUS
  Filled 2022-11-27: qty 10

## 2022-11-27 MED ORDER — SODIUM CHLORIDE 0.9 % IV SOLN
1000.0000 mg/m2 | Freq: Once | INTRAVENOUS | Status: AC
  Administered 2022-11-27: 2318 mg via INTRAVENOUS
  Filled 2022-11-27: qty 60.96

## 2022-11-27 MED ORDER — MAGNESIUM SULFATE 2 GM/50ML IV SOLN
2.0000 g | Freq: Once | INTRAVENOUS | Status: AC
  Administered 2022-11-27: 2 g via INTRAVENOUS
  Filled 2022-11-27: qty 50

## 2022-11-27 MED ORDER — POTASSIUM CHLORIDE IN NACL 20-0.9 MEQ/L-% IV SOLN
Freq: Once | INTRAVENOUS | Status: AC
  Filled 2022-11-27: qty 1000

## 2022-11-27 MED ORDER — HEPARIN SOD (PORK) LOCK FLUSH 100 UNIT/ML IV SOLN
500.0000 [IU] | Freq: Once | INTRAVENOUS | Status: DC | PRN
  Filled 2022-11-27: qty 5

## 2022-11-27 NOTE — Patient Instructions (Signed)
 [facility]  Discharge Instructions: Thank you for choosing [facility] to provide your oncology and hematology care.  If you have a lab appointment with the Cancer Center, please go directly to the Cancer Center and check in at the registration area.  Wear comfortable clothing and clothing appropriate for easy access to any Portacath or PICC line.   We strive to give you quality time with your provider. You may need to reschedule your appointment if you arrive late (15 or more minutes).  Arriving late affects you and other patients whose appointments are after yours.  Also, if you miss three or more appointments without notifying the office, you may be dismissed from the clinic at the provider's discretion.      For prescription refill requests, have your pharmacy contact our office and allow 72 hours for refills to be completed.    Today you received the following chemotherapy and/or immunotherapy agents Cisplatin & Gemzar      To help prevent nausea and vomiting after your treatment, we encourage you to take your nausea medication as directed.  BELOW ARE SYMPTOMS THAT SHOULD BE REPORTED IMMEDIATELY: *FEVER GREATER THAN 100.4 F (38 C) OR HIGHER *CHILLS OR SWEATING *NAUSEA AND VOMITING THAT IS NOT CONTROLLED WITH YOUR NAUSEA MEDICATION *UNUSUAL SHORTNESS OF BREATH *UNUSUAL BRUISING OR BLEEDING *URINARY PROBLEMS (pain or burning when urinating, or frequent urination) *BOWEL PROBLEMS (unusual diarrhea, constipation, pain near the anus) TENDERNESS IN MOUTH AND THROAT WITH OR WITHOUT PRESENCE OF ULCERS (sore throat, sores in mouth, or a toothache) UNUSUAL RASH, SWELLING OR PAIN  UNUSUAL VAGINAL DISCHARGE OR ITCHING   Items with * indicate a potential emergency and should be followed up as soon as possible or go to the Emergency Department if any problems should occur.  Please show the CHEMOTHERAPY ALERT CARD or IMMUNOTHERAPY ALERT CARD at  check-in to the Emergency Department and triage nurse.  Should you have questions after your visit or need to cancel or reschedule your appointment, please contact [facility]  [phone] and follow the prompts.  Office hours are 8:00 a.m. to 4:30 p.m. Monday - Friday. Please note that voicemails left after 4:00 p.m. may not be returned until the following business day.  We are closed weekends and major holidays. You have access to a nurse at all times for urgent questions. Please call the main number to the clinic [phone] and follow the prompts.  For any non-urgent questions, you may also contact your provider using MyChart. We now offer e-Visits for anyone 52 and older to request care online for non-urgent symptoms. For details visit [email or web address].   Also download the MyChart app! Go to the app store, search "MyChart", open the app, select [facility], and log in with your MyChart username and password.

## 2022-11-28 ENCOUNTER — Other Ambulatory Visit: Payer: Self-pay | Admitting: Internal Medicine

## 2022-11-30 ENCOUNTER — Encounter: Payer: Self-pay | Admitting: Internal Medicine

## 2022-11-30 ENCOUNTER — Telehealth: Payer: Self-pay

## 2022-11-30 ENCOUNTER — Inpatient Hospital Stay: Payer: BC Managed Care – PPO

## 2022-11-30 ENCOUNTER — Ambulatory Visit
Admission: RE | Admit: 2022-11-30 | Discharge: 2022-11-30 | Disposition: A | Discharge status: Home / Self Care | Payer: BC Managed Care – PPO | Source: Ambulatory Visit | Attending: Internal Medicine | Admitting: Internal Medicine

## 2022-11-30 DIAGNOSIS — Z79899 Other long term (current) drug therapy: Secondary | ICD-10-CM | POA: Diagnosis not present

## 2022-11-30 DIAGNOSIS — C778 Secondary and unspecified malignant neoplasm of lymph nodes of multiple regions: Secondary | ICD-10-CM | POA: Diagnosis not present

## 2022-11-30 DIAGNOSIS — C7951 Secondary malignant neoplasm of bone: Secondary | ICD-10-CM | POA: Diagnosis not present

## 2022-11-30 DIAGNOSIS — C801 Malignant (primary) neoplasm, unspecified: Secondary | ICD-10-CM | POA: Diagnosis not present

## 2022-11-30 DIAGNOSIS — C7802 Secondary malignant neoplasm of left lung: Secondary | ICD-10-CM | POA: Insufficient documentation

## 2022-11-30 DIAGNOSIS — C221 Intrahepatic bile duct carcinoma: Secondary | ICD-10-CM | POA: Insufficient documentation

## 2022-11-30 DIAGNOSIS — T451X5A Adverse effect of antineoplastic and immunosuppressive drugs, initial encounter: Secondary | ICD-10-CM | POA: Diagnosis not present

## 2022-11-30 DIAGNOSIS — Z5111 Encounter for antineoplastic chemotherapy: Secondary | ICD-10-CM | POA: Diagnosis not present

## 2022-11-30 DIAGNOSIS — D6481 Anemia due to antineoplastic chemotherapy: Secondary | ICD-10-CM | POA: Diagnosis not present

## 2022-11-30 DIAGNOSIS — Z5112 Encounter for antineoplastic immunotherapy: Secondary | ICD-10-CM | POA: Diagnosis not present

## 2022-11-30 DIAGNOSIS — C787 Secondary malignant neoplasm of liver and intrahepatic bile duct: Secondary | ICD-10-CM | POA: Diagnosis not present

## 2022-11-30 DIAGNOSIS — Z7952 Long term (current) use of systemic steroids: Secondary | ICD-10-CM | POA: Diagnosis not present

## 2022-11-30 DIAGNOSIS — C7801 Secondary malignant neoplasm of right lung: Secondary | ICD-10-CM | POA: Diagnosis not present

## 2022-11-30 DIAGNOSIS — I10 Essential (primary) hypertension: Secondary | ICD-10-CM | POA: Diagnosis not present

## 2022-11-30 MED ORDER — IOHEXOL 300 MG/ML  SOLN
100.0000 mL | Freq: Once | INTRAMUSCULAR | Status: AC | PRN
  Administered 2022-11-30: 100 mL via INTRAVENOUS

## 2022-11-30 MED ORDER — PEGFILGRASTIM-JMDB 6 MG/0.6ML ~~LOC~~ SOSY
6.0000 mg | PREFILLED_SYRINGE | Freq: Once | SUBCUTANEOUS | Status: AC
  Administered 2022-11-30: 6 mg via SUBCUTANEOUS
  Filled 2022-11-30: qty 0.6

## 2022-11-30 MED ORDER — AMLODIPINE BESYLATE 5 MG PO TABS: 5.0000 mg | ORAL_TABLET | Freq: Every day | ORAL | 0 refills | Status: DC

## 2022-11-30 NOTE — Telephone Encounter (Signed)
Placed patients referral for [facility] on 11/26/2022.

## 2022-12-01 ENCOUNTER — Telehealth: Payer: Self-pay

## 2022-12-01 NOTE — Telephone Encounter (Signed)
Faxed over referral to [facility] for Cyst on patients knee on 11/26/2022.

## 2022-12-07 ENCOUNTER — Encounter: Payer: Self-pay | Admitting: Internal Medicine

## 2022-12-10 ENCOUNTER — Encounter: Payer: Self-pay | Admitting: Internal Medicine

## 2022-12-10 ENCOUNTER — Inpatient Hospital Stay: Payer: BC Managed Care – PPO

## 2022-12-10 ENCOUNTER — Other Ambulatory Visit: Payer: Self-pay

## 2022-12-10 ENCOUNTER — Inpatient Hospital Stay: Payer: BC Managed Care – PPO | Admitting: Internal Medicine

## 2022-12-10 VITALS — BP 123/91 | HR 98 | Temp 97.5°F | Wt 240.0 lb

## 2022-12-10 DIAGNOSIS — C7801 Secondary malignant neoplasm of right lung: Secondary | ICD-10-CM

## 2022-12-10 DIAGNOSIS — C7802 Secondary malignant neoplasm of left lung: Secondary | ICD-10-CM

## 2022-12-10 DIAGNOSIS — T451X5A Adverse effect of antineoplastic and immunosuppressive drugs, initial encounter: Secondary | ICD-10-CM | POA: Diagnosis not present

## 2022-12-10 DIAGNOSIS — C221 Intrahepatic bile duct carcinoma: Secondary | ICD-10-CM

## 2022-12-10 DIAGNOSIS — I10 Essential (primary) hypertension: Secondary | ICD-10-CM | POA: Diagnosis not present

## 2022-12-10 DIAGNOSIS — Z7952 Long term (current) use of systemic steroids: Secondary | ICD-10-CM | POA: Diagnosis not present

## 2022-12-10 DIAGNOSIS — C7951 Secondary malignant neoplasm of bone: Secondary | ICD-10-CM | POA: Diagnosis not present

## 2022-12-10 DIAGNOSIS — Z5112 Encounter for antineoplastic immunotherapy: Secondary | ICD-10-CM

## 2022-12-10 DIAGNOSIS — M25561 Pain in right knee: Secondary | ICD-10-CM

## 2022-12-10 DIAGNOSIS — Z5111 Encounter for antineoplastic chemotherapy: Secondary | ICD-10-CM | POA: Diagnosis not present

## 2022-12-10 DIAGNOSIS — Z79899 Other long term (current) drug therapy: Secondary | ICD-10-CM | POA: Diagnosis not present

## 2022-12-10 DIAGNOSIS — D6481 Anemia due to antineoplastic chemotherapy: Secondary | ICD-10-CM | POA: Diagnosis not present

## 2022-12-10 LAB — CBC WITH DIFFERENTIAL (CANCER CENTER ONLY)
Abs Immature Granulocytes: 0.47 10*3/uL — ABNORMAL HIGH (ref 0.00–0.07)
Basophils Absolute: 0.1 10*3/uL (ref 0.0–0.1)
Basophils Relative: 1 %
Eosinophils Absolute: 0.1 10*3/uL (ref 0.0–0.5)
Eosinophils Relative: 1 %
HCT: 27.9 % — ABNORMAL LOW (ref 36.0–46.0)
Hemoglobin: 9.1 g/dL — ABNORMAL LOW (ref 12.0–15.0)
Immature Granulocytes: 4 %
Lymphocytes Relative: 9 %
Lymphs Abs: 1.1 10*3/uL (ref 0.7–4.0)
MCH: 36 pg — ABNORMAL HIGH (ref 26.0–34.0)
MCHC: 32.6 g/dL (ref 30.0–36.0)
MCV: 110.3 fL — ABNORMAL HIGH (ref 80.0–100.0)
Monocytes Absolute: 0.5 10*3/uL (ref 0.1–1.0)
Monocytes Relative: 4 %
Neutro Abs: 9.9 10*3/uL — ABNORMAL HIGH (ref 1.7–7.7)
Neutrophils Relative %: 81 %
Platelet Count: 217 10*3/uL (ref 150–400)
RBC: 2.53 MIL/uL — ABNORMAL LOW (ref 3.87–5.11)
RDW: 21.6 % — ABNORMAL HIGH (ref 11.5–15.5)
WBC Count: 12 10*3/uL — ABNORMAL HIGH (ref 4.0–10.5)
nRBC: 0.5 % — ABNORMAL HIGH (ref 0.0–0.2)

## 2022-12-10 LAB — IRON AND TIBC
Iron: 81 ug/dL (ref 28–170)
Saturation Ratios: 22 % (ref 10.4–31.8)
TIBC: 367 ug/dL (ref 250–450)
UIBC: 286 ug/dL

## 2022-12-10 LAB — CMP (CANCER CENTER ONLY)
ALT: 22 U/L (ref 0–44)
AST: 27 U/L (ref 15–41)
Albumin: 4.1 g/dL (ref 3.5–5.0)
Alkaline Phosphatase: 150 U/L — ABNORMAL HIGH (ref 38–126)
Anion gap: 6 (ref 5–15)
BUN: 15 mg/dL (ref 6–20)
CO2: 24 mmol/L (ref 22–32)
Calcium: 8.8 mg/dL — ABNORMAL LOW (ref 8.9–10.3)
Chloride: 107 mmol/L (ref 98–111)
Creatinine: 0.75 mg/dL (ref 0.44–1.00)
GFR, Estimated: >60 mL/min (ref >60)
Glucose, Bld: 107 mg/dL — ABNORMAL HIGH (ref 70–99)
Potassium: 4.2 mmol/L (ref 3.5–5.1)
Sodium: 137 mmol/L (ref 135–145)
Total Bilirubin: 0.3 mg/dL (ref 0.3–1.2)
Total Protein: 6.9 g/dL (ref 6.5–8.1)

## 2022-12-10 LAB — MAGNESIUM: Magnesium: 1.9 mg/dL (ref 1.7–2.4)

## 2022-12-10 LAB — FERRITIN: Ferritin: 545 ng/mL — ABNORMAL HIGH (ref 11–307)

## 2022-12-10 MED ORDER — ALBUTEROL SULFATE HFA 108 (90 BASE) MCG/ACT IN AERS: 2.0000 | INHALATION_SPRAY | Freq: Four times a day (QID) | RESPIRATORY_TRACT | 0 refills | Status: DC | PRN

## 2022-12-10 MED FILL — Dexamethasone Sodium Phosphate Inj 100 MG/10ML: INTRAMUSCULAR | Qty: 1 | Status: AC

## 2022-12-10 MED FILL — Fosaprepitant Dimeglumine For IV Infusion 150 MG (Base Eq): INTRAVENOUS | Qty: 5 | Status: AC

## 2022-12-10 NOTE — Progress Notes (Signed)
[facility] CONSULT NOTE  Patient Care Team: Reubin Milan, MD as PCP - General (Internal Medicine) Vicki Buff, RN as Oncology Nurse Navigator Vicki Barter, MD as Consulting Physician (Oncology)   CANCER STAGING   Cancer Staging  Cholangiocarcinoma [facility]) Staging form: Intrahepatic Bile Duct, AJCC 8th Edition - Clinical stage from 08/26/2022: Stage IV (cM1) - Signed by Vicki Barter, MD on 09/03/2022 Stage prefix: Initial diagnosis   ASSESSMENT & PLAN:  Vicki [facility] 57 y.o. 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  # Intrahepatic Cholangiocarcinoma, stage IV - s/p liver biopsy on 08/26/2022 showed metastatic moderately differentiated adenocarcinoma.  IHC stain positive for CK7 and CK20.  TTF-1, PAX8, GATA3 and CDX2 are negative.  Differentials include cholangiocarcinoma and pancreatic carcinoma.  Pancreatic cancer is less likely since it looks normal on the imaging.   Plan- -patient sought second opinion with Dr. Hulen Luster at [facility] who agreed with the treatment plan.  Scheduled for follow-up on October 22. -CT imaging after 4 cycles of treatment showed stable to mildly improved disease.  CA 19-9 has been trending down. -Labs reviewed and acceptable for treatment.  Patient continues to have excellent functional status.  Will proceed with cycle 5-day 1 of cisplatin, gemcitabine and durvalumab tomorrow.  Reported mild numbness in right foot to toes.  Plan to monitor.  -PD-L1 0%.  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 -Pretreatment CA 19-9 352 -> 130.  Repeat CA 19-9 next week.  # Chemo induced anemia -Will check iron level to rule out any iron deficiency. -Hemoglobin 9.1 which is stable.  Continue to monitor.  # Shortness of breath -Mainly on exertion.  Started about a week ago.  CT chest did not show acute findings that would explain her symptoms.  We discussed  about role of echocardiogram and she would like to hold off.  Continue to monitor symptoms.  Patient was made aware to let us know if her symptoms worsen. -Started on albuterol inhaler every 6 as needed.  # Right knee pain -MRI right knee showing horizontal tear in the posterior horn median meniscus and moderate-sized Baker's cyst with suspected leak or rupture, tricompartmental osteoarthritis.   -Patient was referred to [facility] which is not covered by her insurance.  Patient will look into in network orthopedics and will let us know.  Will make another referral.  # Hypertension -Continue with amlodipine 5 mg daily.   # Cough -Continue with Tussionex as needed.  # Poor appetite -Discussed about nutrition consult and appetite stimulant.  Would like to hold off  # History of sarcoidosis -Management per pulmonary -Never required treatment.  Orders Placed This Encounter  Procedures   Ferritin    Standing Status:   Future    Number of Occurrences:   1    Standing Expiration Date:   12/10/2023   Iron and TIBC([facility])    Standing Status:   Future    Number of Occurrences:   1    Standing Expiration Date:   12/10/2023   Cancer antigen 19-9    Standing Status:   Future    Standing Expiration Date:   12/10/2023   RTC in 1 week for MD visit, labs, cycle 5-day 8 of cisplatin and gemcitabine.  The total time spent in the appointment was 30 minutes encounter with patients including review of chart and various tests results, discussions about plan of care and coordination of care plan  All questions were answered. The patient knows to call the clinic with any problems, questions or concerns. No barriers to learning was detected.  Vicki Barter, MD 10/17/202411:52 AM   HISTORY OF PRESENTING ILLNESS:  Vicki Phillips 57 y.o. 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  Patient reports  she presented to ED with complaints of chest pain.  Had chest x-ray which showed pulmonary nodules was referred to pulmonary.  Records not available.  Was then seen by Dr. Aundria Rud on 08/05/2022.  CT chest showed innumerable pulmonary nodules of varying sizes throughout the lung.  Largest spiculated appearing nodule of anterior right upper lobe measuring 2 x 2 cm, at least 1 ill-defined hypodense lesion of the anterior left lobe of the liver.  Despite stated history of sarcoidosis, there are no specific or characteristic features of sarcoidosis with respect to distribution, morphology, associated fibrotic change, nor associated mediastinal lymphadenopathy. 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  She has history of sarcoidosis but was never on treatment.  Reports that she does not see doctors on a regular basis because she is had bad experiences.  Interval history Patient seen today prior to cycle 5-day 1 of cisplatin and gemcitabine, Durvalumab Patient reports shortness of breath with started last week few days after her CT scan.  Mainly on exertion.  Patient does feel down on her off week once the steroid effects is weaned off.  Continues to have cough manageable with Tussionex.  Continues to have right knee pain.  [facility] unable to see her since out of network.  Patient will look into in network orthopedic options and will let me know.  Reports mild numbness in right foot to toes.  Denies any other sites for neuropathy, decreased hearing or tinnitus.  I have reviewed her chart and materials related to her cancer extensively and collaborated history with the patient. Summary of oncologic history is as follows: Oncology History  Cholangiocarcinoma (HCC)  08/26/2022 Cancer Staging   Staging form: Intrahepatic Bile Duct, AJCC 8th Edition -  Clinical stage from 08/26/2022: Stage IV (cM1) - Signed by Vicki Barter, MD on 09/03/2022 Stage prefix: Initial diagnosis   09/03/2022 Initial Diagnosis   Cholangiocarcinoma (HCC)   09/18/2022 -  Chemotherapy   Patient is on Treatment Plan : BILIARY TRACT Cisplatin + Gemcitabine D1,8 + Durvalumab (1500) D1 q21d / Durvalumab (1500) q28d     Metastasis to lung (HCC)  09/03/2022 Initial Diagnosis   Metastasis to lung (HCC)   09/18/2022 -  Chemotherapy   Patient is on Treatment Plan : BILIARY TRACT Cisplatin + Gemcitabine D1,8 + Durvalumab (1500) D1 q21d / Durvalumab (1500) q28d       MEDICAL HISTORY:  Past Medical History:  Diagnosis Date   Allergy    Sarcoidosis 2011    SURGICAL HISTORY: Past Surgical History:  Procedure Laterality Date   CESAREAN SECTION     x 2   COLONOSCOPY WITH PROPOFOL N/A 08/10/2019   Procedure: COLONOSCOPY WITH PROPOFOL;  Surgeon: Midge Minium, MD;  Location: [facility];  Service: Endoscopy;  Laterality: N/A;  priority 4   IR IMAGING GUIDED PORT INSERTION  09/11/2022   LUNG BIOPSY      SOCIAL HISTORY: Social History   Socioeconomic History   Marital status: Single    Spouse name: Not on  file   Number of children: 2   Years of education: Not on file   Highest education level: Not on file  Occupational History   Not on file  Tobacco Use   Smoking status: Never   Smokeless tobacco: Never  Vaping Use   Vaping status: Never Used  Substance and Sexual Activity   Alcohol use: Yes    Alcohol/week: 10.0 standard drinks of alcohol    Types: 10 Standard drinks or equivalent per week   Drug use: No   Sexual activity: Yes    Partners: Male    Birth control/protection: Surgical  Other Topics Concern   Not on file  Social History Narrative   Not on file   Social Determinants of Health   Financial Resource Strain: Low Risk  (09/18/2022)   Overall Financial Resource Strain (CARDIA)    Difficulty of Paying Living Expenses: Not hard at all   Food Insecurity: No Food Insecurity (08/24/2022)   Hunger Vital Sign    Worried About Running Out of Food in the Last Year: Never true    Ran Out of Food in the Last Year: Never true  Transportation Needs: No Transportation Needs (08/24/2022)   PRAPARE - Administrator, Civil Service (Medical): No    Lack of Transportation (Non-Medical): No  Physical Activity: Not on file  Stress: Not on file  Social Connections: Not on file  Intimate Partner Violence: Not At Risk (08/24/2022)   Humiliation, Afraid, Rape, and Kick questionnaire    Fear of Current or Ex-Partner: No    Emotionally Abused: No    Physically Abused: No    Sexually Abused: No    FAMILY HISTORY: Family History  Problem Relation Age of Onset   Stroke Mother    Heart disease Mother    Heart attack Father 64   Heart disease Father    Kidney disease Sister     ALLERGIES:  is allergic to other.  MEDICATIONS:  Current Outpatient Medications  Medication Sig Dispense Refill   albuterol (VENTOLIN HFA) 108 (90 Base) MCG/ACT inhaler Inhale 2 puffs into the lungs every 6 (six) hours as needed for wheezing or shortness of breath. 8 g 0   amLODipine (NORVASC) 5 MG tablet Take 1 tablet (5 mg total) by mouth daily. 90 tablet 0   chlorpheniramine-HYDROcodone (TUSSIONEX) 10-8 MG/5ML Take 5 mLs by mouth at bedtime as needed for cough. 140 mL 0   dexamethasone (DECADRON) 4 MG tablet Take 2 tablets (8 mg) by mouth daily x 3 days starting the day after cisplatin chemotherapy. Take with food. 30 tablet 1   loratadine (CLARITIN) 10 MG tablet Take 10 mg by mouth daily.     pseudoephedrine-acetaminophen (TYLENOL SINUS) 30-500 MG TABS tablet Take 1 tablet by mouth in the morning and at bedtime.     ondansetron (ZOFRAN) 8 MG tablet Take 1 tablet (8 mg total) by mouth every 8 (eight) hours as needed for nausea or vomiting. Start on the third day after cisplatin. (Patient not taking: Reported on 11/26/2022) 30 tablet 1   prochlorperazine  (COMPAZINE) 10 MG tablet Take 1 tablet (10 mg total) by mouth every 6 (six) hours as needed (Nausea or vomiting). (Patient not taking: Reported on 11/05/2022) 30 tablet 1   No current facility-administered medications for this visit.    REVIEW OF SYSTEMS:   Pertinent information mentioned in HPI All other systems were reviewed with the patient and are negative.  PHYSICAL EXAMINATION: ECOG PERFORMANCE STATUS: 1 -  Symptomatic but completely ambulatory  Vitals:   12/10/22 0940  BP: (!) 123/91  Pulse: 98  Temp: (!) 97.5 F (36.4 C)  SpO2: 99%     Filed Weights   12/10/22 0940  Weight: 240 lb (108.9 kg)      GENERAL:alert, no distress and comfortable SKIN: skin color, texture, turgor are normal, no rashes or significant lesions EYES: normal, conjunctiva are pink and non-injected, sclera clear OROPHARYNX:no exudate, no erythema and lips, buccal mucosa, and tongue normal  NECK: supple, thyroid normal size, non-tender, without nodularity LYMPH:  no palpable lymphadenopathy in the cervical, axillary or inguinal LUNGS: clear to auscultation and percussion with normal breathing effort HEART: regular rate & rhythm and no murmurs and no lower extremity edema ABDOMEN:abdomen soft, non-tender and normal bowel sounds Musculoskeletal:no cyanosis of digits and no clubbing  PSYCH: alert & oriented x 3 with fluent speech NEURO: no focal motor/sensory deficits  LABORATORY DATA:  I have reviewed the data as listed Lab Results  Component Value Date   WBC 12.0 (H) 12/10/2022   HGB 9.1 (L) 12/10/2022   HCT 27.9 (L) 12/10/2022   MCV 110.3 (H) 12/10/2022   PLT 217 12/10/2022   Recent Labs    11/19/22 0851 11/26/22 0856 12/10/22 0919  NA 139 133* 137  K 4.0 3.7 4.2  CL 108 101 107  CO2 23 25 24   GLUCOSE 96 104* 107*  BUN 10 14 15   CREATININE 0.70 0.63 0.75  CALCIUM 8.6* 8.9 8.8*  GFRNONAA >60 >60 >60  PROT 6.5 6.8 6.9  ALBUMIN 3.7 3.7 4.1  AST 26 42* 27  ALT 21 92* 22   ALKPHOS 129* 97 150*  BILITOT 0.4 0.8 0.3    RADIOGRAPHIC STUDIES: I have personally reviewed the radiological images as listed and agreed with the findings in the report. CT CHEST ABDOMEN PELVIS W CONTRAST  Result Date: 12/09/2022 CLINICAL DATA:  57 year old female cholangiocarcinoma. Lung metastasis. Chemotherapy treatment. * Tracking Code: BO * EXAM: CT CHEST, ABDOMEN, AND PELVIS WITH CONTRAST TECHNIQUE: Multidetector CT imaging of the chest, abdomen and pelvis was performed following the standard protocol during bolus administration of intravenous contrast. RADIATION DOSE REDUCTION: This exam was performed according to the departmental dose-optimization program which includes automated exposure control, adjustment of the mA and/or kV according to patient size and/or use of iterative reconstruction technique. CONTRAST:  OMNIPAQUE IOHEXOL 300 MG/ML  SOLN COMPARISON:  CT 08/25/2022 FINDINGS: CT CHEST FINDINGS Cardiovascular: No significant vascular findings. Normal heart size. No pericardial effusion. Mediastinum/Nodes: No axillary or supraclavicular adenopathy. No mediastinal or hilar adenopathy. No pericardial fluid. Esophagus normal. Insert or Lungs/Pleura: Innumerable bilateral pulmonary nodules varying size. Appreciable interval change from comparison exam. Measured nodules in the lung bases compared to CT 08/25/2022. 16 mm nodule RIGHT lower lobe on image 119 compares to 15 mm. Bilobed adjacent RIGHT lobe nodule measuring 22 mm compares to 23 mm. Nodule in the isthmus 2 esophageal recess measures 18 mm (101) compared to 17 mm. In the LEFT lower lobe nodule along the posterior aspect of the oblique fissure in LEFT lower lobe measures 7 mm compared to 6 mm. (Image 103) nodule on lung base measures 10 mm compared to 11 mm on image 124. Musculoskeletal: Sclerotic lesion in T3 vertebral body not changed from prior. CT ABDOMEN AND PELVIS FINDINGS Hepatobiliary: A dominant lesion in the central LEFT  hepatic lobe measures 73 x 61 mm compared to 72 68 mm on prior remeasured. Small subcapsular lesion in the RIGHT hepatic  lobe measures 15 mm compared to 15 mm (image 73) no new lesions identified in liver. High-density material within the gallbladder can noted. Pancreas: Pancreas is normal. No ductal dilatation. No pancreatic inflammation. Spleen: Normal spleen Adrenals/urinary tract: Adrenal glands and kidneys are normal. The ureters and bladder normal. Stomach/Bowel: Stomach, small bowel, appendix, and cecum are normal. The colon and rectosigmoid colon are normal. Vascular/Lymphatic: No periportal adenopathy. Several small lymph nodes LEFT aorta at the level the renal veins again noted. For example 8 mm nodule image 73 decreased slightly from 12 mm on prior. No new adenopathy. Reproductive: Uterus and adnexa unremarkable. Other: No peritoneal metastasis Musculoskeletal: No pelvic skeletal metastasis.  Is IMPRESSION: CHEST: 1. No significant change in innumerable bilateral pulmonary metastasis. 2. No new thoracic metastasis. 3. Stable sclerotic skeletal metastasis at T3. PELVIS: 1. Minimal decrease in size of dominant mass within the liver. Smaller RIGHT hepatic lobe metastasis unchanged. 2. No new liver metastasis. 3. Slight decrease in size of LEFT periaortic lymph nodes. 4. No new sites of metastatic disease in the abdomen pelvis. Electronically Signed   By: Genevive Bi M.D.   On: 12/09/2022 13:43   MR KNEE RIGHT W WO CONTRAST  Result Date: 11/26/2022 CLINICAL DATA:  Knee pain.  Baker's cyst on ultrasound. EXAM: MRI OF THE RIGHT KNEE WITHOUT AND WITH CONTRAST TECHNIQUE: Multiplanar, multisequence MR imaging of the knee was performed before and after IV contrast administration. COMPARISON:  Lower extremity ultrasound of 10/30/2022 CONTRAST:  CONTRAST 10 cc Gadavist FINDINGS: MENISCI Medial meniscus: Horizontal tear in the posterior horn appears to involve the free edge and inferior surface on images 19  through 21 of series 12. Lateral meniscus:  Unremarkable LIGAMENTS Cruciates:  Unremarkable Collaterals:  Unremarkable CARTILAGE Patellofemoral: Chondral thinning and irregularity inferiorly along the medial patellar facet on image 17 series 12. Inferior patellar marginal spurring. Mild chondral focal thinning and irregularity along portions of the femoral trochlear groove as on image 14 series 12. Medial: Marginal spurring. Moderate chondral thinning and irregularity. Lateral: Marginal spurring. Chondral fissuring and chondral thinning posteriorly along the lateral femoral condyle. Joint:  Trace knee effusion. Popliteal Fossa: Moderate size Baker's cyst with suspected leak or rupture especially extending cephalad along the posteromedial knee. No abnormal enhancement in this vicinity. Extensor Mechanism:  Unremarkable Bones: No significant extra-articular osseous abnormalities identified. Other: No supplemental non-categorized findings. IMPRESSION: 1. Horizontal tear in the posterior horn medial meniscus. 2. Moderate size Baker's cyst with suspected leak or rupture especially extending cephalad along the posteromedial knee. No abnormal enhancement in this vicinity. 3. Trace knee effusion. 4. Tricompartmental osteoarthritis. Electronically Signed   By: Gaylyn Rong M.D.   On: 11/26/2022 08:52

## 2022-12-10 NOTE — Progress Notes (Signed)
Patient had a CT scan on 11/30/2022. Mid last week patient started getting real short of breath, and now it is got worse to where she can't breath good. The two middle toes on her right foot have lost feeling, which started last week.

## 2022-12-11 ENCOUNTER — Telehealth: Payer: Self-pay

## 2022-12-11 ENCOUNTER — Inpatient Hospital Stay: Payer: BC Managed Care – PPO

## 2022-12-11 VITALS — BP 128/88 | HR 99 | Temp 97.3°F | Resp 16

## 2022-12-11 DIAGNOSIS — I10 Essential (primary) hypertension: Secondary | ICD-10-CM | POA: Diagnosis not present

## 2022-12-11 DIAGNOSIS — C7801 Secondary malignant neoplasm of right lung: Secondary | ICD-10-CM | POA: Diagnosis not present

## 2022-12-11 DIAGNOSIS — T451X5A Adverse effect of antineoplastic and immunosuppressive drugs, initial encounter: Secondary | ICD-10-CM | POA: Diagnosis not present

## 2022-12-11 DIAGNOSIS — Z5111 Encounter for antineoplastic chemotherapy: Secondary | ICD-10-CM | POA: Diagnosis not present

## 2022-12-11 DIAGNOSIS — C7802 Secondary malignant neoplasm of left lung: Secondary | ICD-10-CM | POA: Diagnosis not present

## 2022-12-11 DIAGNOSIS — Z79899 Other long term (current) drug therapy: Secondary | ICD-10-CM | POA: Diagnosis not present

## 2022-12-11 DIAGNOSIS — D6481 Anemia due to antineoplastic chemotherapy: Secondary | ICD-10-CM | POA: Diagnosis not present

## 2022-12-11 DIAGNOSIS — C7951 Secondary malignant neoplasm of bone: Secondary | ICD-10-CM | POA: Diagnosis not present

## 2022-12-11 DIAGNOSIS — Z7952 Long term (current) use of systemic steroids: Secondary | ICD-10-CM | POA: Diagnosis not present

## 2022-12-11 DIAGNOSIS — C221 Intrahepatic bile duct carcinoma: Secondary | ICD-10-CM | POA: Diagnosis not present

## 2022-12-11 DIAGNOSIS — Z5112 Encounter for antineoplastic immunotherapy: Secondary | ICD-10-CM | POA: Diagnosis not present

## 2022-12-11 MED ORDER — SODIUM CHLORIDE 0.9 % IV SOLN
150.0000 mg | Freq: Once | INTRAVENOUS | Status: AC
  Administered 2022-12-11: 150 mg via INTRAVENOUS
  Filled 2022-12-11: qty 150

## 2022-12-11 MED ORDER — SODIUM CHLORIDE 0.9 % IV SOLN
1000.0000 mg/m2 | Freq: Once | INTRAVENOUS | Status: AC
  Administered 2022-12-11: 2318 mg via INTRAVENOUS
  Filled 2022-12-11: qty 52.57

## 2022-12-11 MED ORDER — SODIUM CHLORIDE 0.9 % IV SOLN
25.0000 mg/m2 | Freq: Once | INTRAVENOUS | Status: AC
  Administered 2022-12-11: 58 mg via INTRAVENOUS
  Filled 2022-12-11: qty 58

## 2022-12-11 MED ORDER — HEPARIN SOD (PORK) LOCK FLUSH 100 UNIT/ML IV SOLN
500.0000 [IU] | Freq: Once | INTRAVENOUS | Status: AC | PRN
  Administered 2022-12-11: 500 [IU]
  Filled 2022-12-11: qty 5

## 2022-12-11 MED ORDER — SODIUM CHLORIDE 0.9 % IV SOLN
10.0000 mg | Freq: Once | INTRAVENOUS | Status: AC
  Administered 2022-12-11: 10 mg via INTRAVENOUS
  Filled 2022-12-11: qty 10

## 2022-12-11 MED ORDER — SODIUM CHLORIDE 0.9% FLUSH
10.0000 mL | INTRAVENOUS | Status: DC | PRN
  Administered 2022-12-11: 10 mL
  Filled 2022-12-11: qty 10

## 2022-12-11 MED ORDER — SODIUM CHLORIDE 0.9 % IV SOLN
Freq: Once | INTRAVENOUS | Status: AC
  Filled 2022-12-11: qty 250

## 2022-12-11 MED ORDER — MAGNESIUM SULFATE 2 GM/50ML IV SOLN
2.0000 g | Freq: Once | INTRAVENOUS | Status: AC
  Administered 2022-12-11: 2 g via INTRAVENOUS
  Filled 2022-12-11: qty 50

## 2022-12-11 MED ORDER — PALONOSETRON HCL INJECTION 0.25 MG/5ML
0.2500 mg | Freq: Once | INTRAVENOUS | Status: AC
  Administered 2022-12-11: 0.25 mg via INTRAVENOUS
  Filled 2022-12-11: qty 5

## 2022-12-11 MED ORDER — POTASSIUM CHLORIDE IN NACL 20-0.9 MEQ/L-% IV SOLN
Freq: Once | INTRAVENOUS | Status: AC
  Filled 2022-12-11: qty 1000

## 2022-12-11 MED ORDER — SODIUM CHLORIDE 0.9 % IV SOLN
1500.0000 mg | Freq: Once | INTRAVENOUS | Status: AC
  Administered 2022-12-11: 1500 mg via INTRAVENOUS
  Filled 2022-12-11: qty 30

## 2022-12-11 NOTE — Telephone Encounter (Signed)
I just spoke with [facility] and they do accept her type of [employer] so I just faxed another referral request and sent the patient a MyChart message.

## 2022-12-11 NOTE — Progress Notes (Signed)
MD okay with starting post fluids with treatment.

## 2022-12-15 DIAGNOSIS — C7800 Secondary malignant neoplasm of unspecified lung: Secondary | ICD-10-CM | POA: Diagnosis not present

## 2022-12-15 DIAGNOSIS — D869 Sarcoidosis, unspecified: Secondary | ICD-10-CM | POA: Diagnosis not present

## 2022-12-15 DIAGNOSIS — I10 Essential (primary) hypertension: Secondary | ICD-10-CM | POA: Diagnosis not present

## 2022-12-15 DIAGNOSIS — R748 Abnormal levels of other serum enzymes: Secondary | ICD-10-CM | POA: Diagnosis not present

## 2022-12-15 DIAGNOSIS — R918 Other nonspecific abnormal finding of lung field: Secondary | ICD-10-CM | POA: Diagnosis not present

## 2022-12-15 DIAGNOSIS — C7951 Secondary malignant neoplasm of bone: Secondary | ICD-10-CM | POA: Diagnosis not present

## 2022-12-15 DIAGNOSIS — R7401 Elevation of levels of liver transaminase levels: Secondary | ICD-10-CM | POA: Diagnosis not present

## 2022-12-15 DIAGNOSIS — C221 Intrahepatic bile duct carcinoma: Secondary | ICD-10-CM | POA: Diagnosis not present

## 2022-12-15 DIAGNOSIS — Z79899 Other long term (current) drug therapy: Secondary | ICD-10-CM | POA: Diagnosis not present

## 2022-12-16 MED FILL — Fosaprepitant Dimeglumine For IV Infusion 150 MG (Base Eq): INTRAVENOUS | Qty: 5 | Status: AC

## 2022-12-17 ENCOUNTER — Inpatient Hospital Stay: Payer: BC Managed Care – PPO | Admitting: Internal Medicine

## 2022-12-17 ENCOUNTER — Inpatient Hospital Stay: Payer: BC Managed Care – PPO

## 2022-12-17 VITALS — BP 133/88 | HR 100 | Temp 98.2°F | Wt 237.0 lb

## 2022-12-17 DIAGNOSIS — C7802 Secondary malignant neoplasm of left lung: Secondary | ICD-10-CM

## 2022-12-17 DIAGNOSIS — M25561 Pain in right knee: Secondary | ICD-10-CM | POA: Diagnosis not present

## 2022-12-17 DIAGNOSIS — C7801 Secondary malignant neoplasm of right lung: Secondary | ICD-10-CM | POA: Diagnosis not present

## 2022-12-17 DIAGNOSIS — C221 Intrahepatic bile duct carcinoma: Secondary | ICD-10-CM | POA: Diagnosis not present

## 2022-12-17 DIAGNOSIS — Z7952 Long term (current) use of systemic steroids: Secondary | ICD-10-CM | POA: Diagnosis not present

## 2022-12-17 DIAGNOSIS — Z5111 Encounter for antineoplastic chemotherapy: Secondary | ICD-10-CM

## 2022-12-17 DIAGNOSIS — D6481 Anemia due to antineoplastic chemotherapy: Secondary | ICD-10-CM | POA: Diagnosis not present

## 2022-12-17 DIAGNOSIS — Z5112 Encounter for antineoplastic immunotherapy: Secondary | ICD-10-CM | POA: Diagnosis not present

## 2022-12-17 DIAGNOSIS — C7951 Secondary malignant neoplasm of bone: Secondary | ICD-10-CM | POA: Diagnosis not present

## 2022-12-17 DIAGNOSIS — I10 Essential (primary) hypertension: Secondary | ICD-10-CM | POA: Diagnosis not present

## 2022-12-17 DIAGNOSIS — Z79899 Other long term (current) drug therapy: Secondary | ICD-10-CM | POA: Diagnosis not present

## 2022-12-17 DIAGNOSIS — T451X5A Adverse effect of antineoplastic and immunosuppressive drugs, initial encounter: Secondary | ICD-10-CM | POA: Diagnosis not present

## 2022-12-17 LAB — CBC WITH DIFFERENTIAL (CANCER CENTER ONLY)
Abs Immature Granulocytes: 0 10*3/uL (ref 0.00–0.07)
Basophils Absolute: 0 10*3/uL (ref 0.0–0.1)
Basophils Relative: 1 %
Eosinophils Absolute: 0 10*3/uL (ref 0.0–0.5)
Eosinophils Relative: 1 %
HCT: 24.8 % — ABNORMAL LOW (ref 36.0–46.0)
Hemoglobin: 8.3 g/dL — ABNORMAL LOW (ref 12.0–15.0)
Immature Granulocytes: 0 %
Lymphocytes Relative: 31 %
Lymphs Abs: 0.6 10*3/uL — ABNORMAL LOW (ref 0.7–4.0)
MCH: 36.4 pg — ABNORMAL HIGH (ref 26.0–34.0)
MCHC: 33.5 g/dL (ref 30.0–36.0)
MCV: 108.8 fL — ABNORMAL HIGH (ref 80.0–100.0)
Monocytes Absolute: 0.2 10*3/uL (ref 0.1–1.0)
Monocytes Relative: 8 %
Neutro Abs: 1.2 10*3/uL — ABNORMAL LOW (ref 1.7–7.7)
Neutrophils Relative %: 59 %
Platelet Count: 206 10*3/uL (ref 150–400)
RBC: 2.28 MIL/uL — ABNORMAL LOW (ref 3.87–5.11)
RDW: 19.8 % — ABNORMAL HIGH (ref 11.5–15.5)
WBC Count: 2.1 10*3/uL — ABNORMAL LOW (ref 4.0–10.5)
nRBC: 0 % (ref 0.0–0.2)

## 2022-12-17 LAB — CMP (CANCER CENTER ONLY)
ALT: 63 U/L — ABNORMAL HIGH (ref 0–44)
AST: 36 U/L (ref 15–41)
Albumin: 3.8 g/dL (ref 3.5–5.0)
Alkaline Phosphatase: 105 U/L (ref 38–126)
Anion gap: 6 (ref 5–15)
BUN: 18 mg/dL (ref 6–20)
CO2: 26 mmol/L (ref 22–32)
Calcium: 9 mg/dL (ref 8.9–10.3)
Chloride: 103 mmol/L (ref 98–111)
Creatinine: 0.64 mg/dL (ref 0.44–1.00)
GFR, Estimated: >60 mL/min (ref >60)
Glucose, Bld: 108 mg/dL — ABNORMAL HIGH (ref 70–99)
Potassium: 4.1 mmol/L (ref 3.5–5.1)
Sodium: 135 mmol/L (ref 135–145)
Total Bilirubin: 0.6 mg/dL (ref 0.3–1.2)
Total Protein: 6.7 g/dL (ref 6.5–8.1)

## 2022-12-17 LAB — MAGNESIUM: Magnesium: 1.9 mg/dL (ref 1.7–2.4)

## 2022-12-17 MED ORDER — SODIUM CHLORIDE 0.9% FLUSH
10.0000 mL | Freq: Once | INTRAVENOUS | Status: AC
  Administered 2022-12-17: 10 mL via INTRAVENOUS
  Filled 2022-12-17: qty 10

## 2022-12-17 MED ORDER — HEPARIN SOD (PORK) LOCK FLUSH 100 UNIT/ML IV SOLN
500.0000 [IU] | Freq: Once | INTRAVENOUS | Status: AC
  Administered 2022-12-17: 500 [IU] via INTRAVENOUS
  Filled 2022-12-17: qty 5

## 2022-12-17 NOTE — Progress Notes (Signed)
[facility] CONSULT NOTE  Patient Care Team: Reubin Milan, MD as PCP - General (Internal Medicine) Glory Buff, RN as Oncology Nurse Navigator Michaelyn Barter, MD as Consulting Physician (Oncology)   CANCER STAGING   Cancer Staging  Cholangiocarcinoma [facility] [address]) Staging form: Intrahepatic Bile Duct, AJCC 8th Edition - Clinical stage from 08/26/2022: Stage IV (cM1) - Signed by Michaelyn Barter, MD on 09/03/2022 Stage prefix: Initial diagnosis   ASSESSMENT & PLAN:  [facility] 57 y.o. 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  # Intrahepatic Cholangiocarcinoma, stage IV - s/p liver biopsy on 08/26/2022 showed metastatic moderately differentiated adenocarcinoma.  IHC stain positive for CK7 and CK20.  TTF-1, PAX8, GATA3 and CDX2 are negative.  Differentials include cholangiocarcinoma and pancreatic carcinoma.   Plan- -patient sought second opinion with Dr. Hulen Luster at [facility] who agreed with the treatment plan.  Continue with the current plan.  Has follow-up scheduled in January 2025.  -CT imaging after 4 cycles of treatment showed stable to mildly improved disease.   -Labs reviewed and acceptable for treatment.  Hemoglobin 8.3 noted.  Will add Retacrit 40,000 unit tomorrow.  AST 36, ALT 63, ALP 105.  Improved from the last blood work she had with Dr. Hulen Luster.  Will proceed with cycle 5-day 8 of cisplatin and gemcitabine tomorrow.  Patient is starting to notice increasing fatigue which can be coming from chemotherapy.  We discussed about exercise regimen.  Overall she is very active but her right knee pain has been limiting her.  Referral to [facility] in [place] has been sent.  She has not heard back.  I have advised to give them a call.  -PD-L1 0%.  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 -Pretreatment CA 19-9 352 -> 130 ->164.  Will continue to trend.  # Chemo induced  anemia -Iron panel consistent with anemia of chronic disease -Hemoglobin today 8.3.  Will start on Retacrit.  # Shortness of breath -Stable.  Continue with albuterol inhaler  # Right knee pain -MRI right knee showing horizontal tear in the posterior horn median meniscus and moderate-sized Baker's cyst with suspected leak or rupture, tricompartmental osteoarthritis.   -Patient was referred to [facility] which is not covered by her insurance.  So the referral was canceled.  They reached out again that the patient can be seen.  Another referral was sent.  Patient has not heard back yet.  I have advised her to give them a call.  # Hypertension -Continue with amlodipine 5 mg daily.   # Cough -Continue with Tussionex as needed.  # Poor appetite -Discussed about nutrition consult and appetite stimulant.  Would like to hold off  # History of sarcoidosis -Management per pulmonary -Never required treatment.  Orders Placed This Encounter  Procedures   CBC with Differential ([facility] Only)    Standing Status:   Future    Standing Expiration Date:   01/02/2024   CMP ([facility] only)    Standing Status:   Future    Standing Expiration Date:   01/02/2024   T4    Standing Status:   Future    Standing Expiration Date:   01/02/2024   TSH    Standing Status:   Future    Standing Expiration Date:   01/02/2024   Magnesium    Standing Status:   Future    Standing Expiration Date:   01/02/2024   CBC with Differential ([facility]  Only)    Standing Status:   Future    Standing Expiration Date:   01/09/2024   CMP ([facility] only)    Standing Status:   Future    Standing Expiration Date:   01/09/2024   Magnesium    Standing Status:   Future    Standing Expiration Date:   01/09/2024   Hold Tube- Blood Bank    Standing Status:   Future    Standing Expiration Date:   12/17/2023   RTC in 2 weeks for MD visit, labs, cycle 6 of cisplatin, gemcitabine and Durvalumab.  The total time  spent in the appointment was 30 minutes encounter with patients including review of chart and various tests results, discussions about plan of care and coordination of care plan   All questions were answered. The patient knows to call the clinic with any problems, questions or concerns. No barriers to learning was detected.  Michaelyn Barter, MD 10/24/202411:59 AM   HISTORY OF PRESENTING ILLNESS:  [facility] 57 y.o. 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  Patient reports she presented to ED with complaints of chest pain.  Had chest x-ray which showed pulmonary nodules was referred to pulmonary.  Records not available.  Was then seen by Dr. Aundria Rud on 08/05/2022.  CT chest showed innumerable pulmonary nodules of varying sizes throughout the lung.  Largest spiculated appearing nodule of anterior right upper lobe measuring 2 x 2 cm, at least 1 ill-defined hypodense lesion of the anterior left lobe of the liver.  Despite stated history of sarcoidosis, there are no specific or characteristic features of sarcoidosis with respect to distribution, morphology, associated fibrotic change, nor associated mediastinal lymphadenopathy. 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  She has history of sarcoidosis but was never on treatment.  Reports that she does not see doctors on a regular basis because she is had bad experiences.  Interval history Patient seen today prior to cycle 5-day 8 of cisplatin and gemcitabine, Durvalumab Patient reports feeling little better than she did last week.  Her major complaint today was the fatigue and weakness.  She was tearful during the conversation about the overall situation.  Worried about CA 19-9 slightly trending up.   Shortness of breath and chest pain remains stable.  I have reviewed her chart and materials related to her cancer extensively and collaborated history with the patient. Summary of oncologic history is as follows: Oncology History  Cholangiocarcinoma (HCC)  08/26/2022 Cancer Staging   Staging form: Intrahepatic Bile Duct, AJCC 8th Edition - Clinical stage from 08/26/2022: Stage IV (cM1) - Signed by Michaelyn Barter, MD on 09/03/2022 Stage prefix: Initial diagnosis   09/03/2022 Initial Diagnosis   Cholangiocarcinoma (HCC)   09/18/2022 -  Chemotherapy   Patient is on Treatment Plan : BILIARY TRACT Cisplatin + Gemcitabine D1,8 + Durvalumab (1500) D1 q21d / Durvalumab (1500) q28d     Metastasis to lung (HCC)  09/03/2022 Initial Diagnosis   Metastasis to lung (HCC)   09/18/2022 -  Chemotherapy   Patient is on Treatment Plan : BILIARY TRACT Cisplatin + Gemcitabine D1,8 + Durvalumab (1500) D1 q21d / Durvalumab (1500) q28d       MEDICAL HISTORY:  Past Medical History:  Diagnosis Date   Allergy    Sarcoidosis 2011  SURGICAL HISTORY: Past Surgical History:  Procedure Laterality Date   CESAREAN SECTION     x 2   COLONOSCOPY WITH PROPOFOL N/A 08/10/2019   Procedure: COLONOSCOPY WITH PROPOFOL;  Surgeon: Midge Minium, MD;  [facility];  Service: Endoscopy;  Laterality: N/A;  priority 4   IR IMAGING GUIDED PORT INSERTION  09/11/2022   LUNG BIOPSY      SOCIAL HISTORY: Social History   Socioeconomic History   Marital status: Single    Spouse name: Not on file   Number of children: 2   Years of education: Not on file   Highest education level: Not on file  Occupational History   Not on file  Tobacco Use   Smoking status: Never   Smokeless tobacco: Never  Vaping Use   Vaping status: Never Used  Substance and Sexual Activity   Alcohol use: Yes    Alcohol/week: 10.0 standard drinks of alcohol    Types: 10 Standard drinks or equivalent per week   Drug use: No   Sexual  activity: Yes    Partners: Male    Birth control/protection: Surgical  Other Topics Concern   Not on file  Social History Narrative   Not on file   Social Determinants of Health   Financial Resource Strain: Low Risk  (09/18/2022)   Overall Financial Resource Strain (CARDIA)    Difficulty of Paying Living Expenses: Not hard at all  Food Insecurity: No Food Insecurity (08/24/2022)   Hunger Vital Sign    Worried About Running Out of Food in the Last Year: Never true    Ran Out of Food in the Last Year: Never true  Transportation Needs: No Transportation Needs (08/24/2022)   PRAPARE - Administrator, Civil Service (Medical): No    Lack of Transportation (Non-Medical): No  Physical Activity: Not on file  Stress: Not on file  Social Connections: Not on file  Intimate Partner Violence: Not At Risk (08/24/2022)   Humiliation, Afraid, Rape, and Kick questionnaire    Fear of Current or Ex-Partner: No    Emotionally Abused: No    Physically Abused: No    Sexually Abused: No    FAMILY HISTORY: Family History  Problem Relation Age of Onset   Stroke Mother    Heart disease Mother    Heart attack Father 41   Heart disease Father    Kidney disease Sister     ALLERGIES:  is allergic to other.  MEDICATIONS:  Current Outpatient Medications  Medication Sig Dispense Refill   albuterol (VENTOLIN HFA) 108 (90 Base) MCG/ACT inhaler Inhale 2 puffs into the lungs every 6 (six) hours as needed for wheezing or shortness of breath. 8 g 0   amLODipine (NORVASC) 5 MG tablet Take 1 tablet (5 mg total) by mouth daily. 90 tablet 0   chlorpheniramine-HYDROcodone (TUSSIONEX) 10-8 MG/5ML Take 5 mLs by mouth at bedtime as needed for cough. 140 mL 0   dexamethasone (DECADRON) 4 MG tablet Take 2 tablets (8 mg) by mouth daily x 3 days starting the day after cisplatin chemotherapy. Take with food. 30 tablet 1   loratadine (CLARITIN) 10 MG tablet Take 10 mg by mouth daily.     ondansetron (ZOFRAN) 8 MG  tablet Take 1 tablet (8 mg total) by mouth every 8 (eight) hours as needed for nausea or vomiting. Start on the third day after cisplatin. (Patient not taking: Reported on 11/26/2022) 30 tablet 1   prochlorperazine (COMPAZINE) 10 MG tablet  Take 1 tablet (10 mg total) by mouth every 6 (six) hours as needed (Nausea or vomiting). (Patient not taking: Reported on 11/05/2022) 30 tablet 1   pseudoephedrine-acetaminophen (TYLENOL SINUS) 30-500 MG TABS tablet Take 1 tablet by mouth in the morning and at bedtime.     No current facility-administered medications for this visit.    REVIEW OF SYSTEMS:   Pertinent information mentioned in HPI All other systems were reviewed with the patient and are negative.  PHYSICAL EXAMINATION: ECOG PERFORMANCE STATUS: 1 - Symptomatic but completely ambulatory  Vitals:   12/17/22 1003  BP: 133/88  Pulse: 100  Temp: 98.2 F (36.8 C)  SpO2: 99%     Filed Weights   12/17/22 1003  Weight: 237 lb (107.5 kg)      GENERAL:alert, no distress and comfortable SKIN: skin color, texture, turgor are normal, no rashes or significant lesions EYES: normal, conjunctiva are pink and non-injected, sclera clear OROPHARYNX:no exudate, no erythema and lips, buccal mucosa, and tongue normal  NECK: supple, thyroid normal size, non-tender, without nodularity LYMPH:  no palpable lymphadenopathy in the cervical, axillary or inguinal LUNGS: clear to auscultation and percussion with normal breathing effort HEART: regular rate & rhythm and no murmurs and no lower extremity edema ABDOMEN:abdomen soft, non-tender and normal bowel sounds Musculoskeletal:no cyanosis of digits and no clubbing  PSYCH: alert & oriented x 3 with fluent speech NEURO: no focal motor/sensory deficits  LABORATORY DATA:  I have reviewed the data as listed Lab Results  Component Value Date   WBC 2.1 (L) 12/17/2022   HGB 8.3 (L) 12/17/2022   HCT 24.8 (L) 12/17/2022   MCV 108.8 (H) 12/17/2022   PLT 206  12/17/2022   Recent Labs    11/26/22 0856 12/10/22 0919 12/17/22 0955  NA 133* 137 135  K 3.7 4.2 4.1  CL 101 107 103  CO2 25 24 26   GLUCOSE 104* 107* 108*  BUN 14 15 18   CREATININE 0.63 0.75 0.64  CALCIUM 8.9 8.8* 9.0  GFRNONAA >60 >60 >60  PROT 6.8 6.9 6.7  ALBUMIN 3.7 4.1 3.8  AST 42* 27 36  ALT 92* 22 63*  ALKPHOS 97 150* 105  BILITOT 0.8 0.3 0.6    RADIOGRAPHIC STUDIES: I have personally reviewed the radiological images as listed and agreed with the findings in the report. CT CHEST ABDOMEN PELVIS W CONTRAST  Result Date: 12/09/2022 CLINICAL DATA:  57 year old female cholangiocarcinoma. Lung metastasis. Chemotherapy treatment. * Tracking Code: BO * EXAM: CT CHEST, ABDOMEN, AND PELVIS WITH CONTRAST TECHNIQUE: Multidetector CT imaging of the chest, abdomen and pelvis was performed following the standard protocol during bolus administration of intravenous contrast. RADIATION DOSE REDUCTION: This exam was performed according to the departmental dose-optimization program which includes automated exposure control, adjustment of the mA and/or kV according to patient size and/or use of iterative reconstruction technique. CONTRAST:  OMNIPAQUE IOHEXOL 300 MG/ML  SOLN COMPARISON:  CT 08/25/2022 FINDINGS: CT CHEST FINDINGS Cardiovascular: No significant vascular findings. Normal heart size. No pericardial effusion. Mediastinum/Nodes: No axillary or supraclavicular adenopathy. No mediastinal or hilar adenopathy. No pericardial fluid. Esophagus normal. Insert or Lungs/Pleura: Innumerable bilateral pulmonary nodules varying size. Appreciable interval change from comparison exam. Measured nodules in the lung bases compared to CT 08/25/2022. 16 mm nodule RIGHT lower lobe on image 119 compares to 15 mm. Bilobed adjacent RIGHT lobe nodule measuring 22 mm compares to 23 mm. Nodule in the isthmus 2 esophageal recess measures 18 mm (101) compared to 17 mm. In  the LEFT lower lobe nodule along the  posterior aspect of the oblique fissure in LEFT lower lobe measures 7 mm compared to 6 mm. (Image 103) nodule on lung base measures 10 mm compared to 11 mm on image 124. Musculoskeletal: Sclerotic lesion in T3 vertebral body not changed from prior. CT ABDOMEN AND PELVIS FINDINGS Hepatobiliary: A dominant lesion in the central LEFT hepatic lobe measures 73 x 61 mm compared to 72 68 mm on prior remeasured. Small subcapsular lesion in the RIGHT hepatic lobe measures 15 mm compared to 15 mm (image 73) no new lesions identified in liver. High-density material within the gallbladder can noted. Pancreas: Pancreas is normal. No ductal dilatation. No pancreatic inflammation. Spleen: Normal spleen Adrenals/urinary tract: Adrenal glands and kidneys are normal. The ureters and bladder normal. Stomach/Bowel: Stomach, small bowel, appendix, and cecum are normal. The colon and rectosigmoid colon are normal. Vascular/Lymphatic: No periportal adenopathy. Several small lymph nodes LEFT aorta at the level the renal veins again noted. For example 8 mm nodule image 73 decreased slightly from 12 mm on prior. No new adenopathy. Reproductive: Uterus and adnexa unremarkable. Other: No peritoneal metastasis Musculoskeletal: No pelvic skeletal metastasis.  Is IMPRESSION: CHEST: 1. No significant change in innumerable bilateral pulmonary metastasis. 2. No new thoracic metastasis. 3. Stable sclerotic skeletal metastasis at T3. PELVIS: 1. Minimal decrease in size of dominant mass within the liver. Smaller RIGHT hepatic lobe metastasis unchanged. 2. No new liver metastasis. 3. Slight decrease in size of LEFT periaortic lymph nodes. 4. No new sites of metastatic disease in the abdomen pelvis. Electronically Signed   By: Genevive Bi M.D.   On: 12/09/2022 13:43

## 2022-12-17 NOTE — Progress Notes (Signed)
Patient is doing ok, she is still having the shortness of breath and cough.

## 2022-12-18 ENCOUNTER — Inpatient Hospital Stay: Payer: BC Managed Care – PPO

## 2022-12-18 VITALS — BP 130/86 | HR 95 | Temp 99.7°F | Resp 18

## 2022-12-18 DIAGNOSIS — C7801 Secondary malignant neoplasm of right lung: Secondary | ICD-10-CM

## 2022-12-18 DIAGNOSIS — C221 Intrahepatic bile duct carcinoma: Secondary | ICD-10-CM | POA: Diagnosis not present

## 2022-12-18 DIAGNOSIS — I10 Essential (primary) hypertension: Secondary | ICD-10-CM | POA: Diagnosis not present

## 2022-12-18 DIAGNOSIS — Z7952 Long term (current) use of systemic steroids: Secondary | ICD-10-CM | POA: Diagnosis not present

## 2022-12-18 DIAGNOSIS — Z79899 Other long term (current) drug therapy: Secondary | ICD-10-CM | POA: Diagnosis not present

## 2022-12-18 DIAGNOSIS — D6481 Anemia due to antineoplastic chemotherapy: Secondary | ICD-10-CM | POA: Diagnosis not present

## 2022-12-18 DIAGNOSIS — C7802 Secondary malignant neoplasm of left lung: Secondary | ICD-10-CM | POA: Diagnosis not present

## 2022-12-18 DIAGNOSIS — Z5112 Encounter for antineoplastic immunotherapy: Secondary | ICD-10-CM | POA: Diagnosis not present

## 2022-12-18 DIAGNOSIS — C7951 Secondary malignant neoplasm of bone: Secondary | ICD-10-CM | POA: Diagnosis not present

## 2022-12-18 DIAGNOSIS — Z5111 Encounter for antineoplastic chemotherapy: Secondary | ICD-10-CM | POA: Diagnosis not present

## 2022-12-18 DIAGNOSIS — T451X5A Adverse effect of antineoplastic and immunosuppressive drugs, initial encounter: Secondary | ICD-10-CM | POA: Diagnosis not present

## 2022-12-18 LAB — CANCER ANTIGEN 19-9: CA 19-9: 122 U/mL — ABNORMAL HIGH (ref 0–35)

## 2022-12-18 MED ORDER — FOSAPREPITANT DIMEGLUMINE INJECTION 150 MG
150.0000 mg | Freq: Once | INTRAVENOUS | Status: AC
  Administered 2022-12-18: 150 mg via INTRAVENOUS
  Filled 2022-12-18: qty 150

## 2022-12-18 MED ORDER — EPOETIN ALFA-EPBX 40000 UNIT/ML IJ SOLN
40000.0000 [IU] | Freq: Once | INTRAMUSCULAR | Status: AC
  Administered 2022-12-18: 40000 [IU] via SUBCUTANEOUS
  Filled 2022-12-18: qty 1

## 2022-12-18 MED ORDER — HEPARIN SOD (PORK) LOCK FLUSH 100 UNIT/ML IV SOLN
500.0000 [IU] | Freq: Once | INTRAVENOUS | Status: AC | PRN
  Administered 2022-12-18: 500 [IU]
  Filled 2022-12-18: qty 5

## 2022-12-18 MED ORDER — MAGNESIUM SULFATE 2 GM/50ML IV SOLN
2.0000 g | Freq: Once | INTRAVENOUS | Status: AC
  Administered 2022-12-18: 2 g via INTRAVENOUS
  Filled 2022-12-18: qty 50

## 2022-12-18 MED ORDER — SODIUM CHLORIDE 0.9 % IV SOLN
Freq: Once | INTRAVENOUS | Status: DC
Start: 2022-12-18 — End: 2022-12-18
  Filled 2022-12-18: qty 250

## 2022-12-18 MED ORDER — DEXAMETHASONE SODIUM PHOSPHATE 10 MG/ML IJ SOLN
10.0000 mg | Freq: Once | INTRAMUSCULAR | Status: AC
  Administered 2022-12-18: 10 mg via INTRAVENOUS
  Filled 2022-12-18: qty 1

## 2022-12-18 MED ORDER — SODIUM CHLORIDE 0.9 % IV SOLN
1000.0000 mg/m2 | Freq: Once | INTRAVENOUS | Status: AC
  Administered 2022-12-18: 2318 mg via INTRAVENOUS
  Filled 2022-12-18: qty 60.96

## 2022-12-18 MED ORDER — SODIUM CHLORIDE 0.9 % IV SOLN
25.0000 mg/m2 | Freq: Once | INTRAVENOUS | Status: AC
  Administered 2022-12-18: 58 mg via INTRAVENOUS
  Filled 2022-12-18: qty 58

## 2022-12-18 MED ORDER — POTASSIUM CHLORIDE IN NACL 20-0.9 MEQ/L-% IV SOLN
Freq: Once | INTRAVENOUS | Status: AC
  Filled 2022-12-18: qty 1000

## 2022-12-18 MED ORDER — SODIUM CHLORIDE 0.9 % IV SOLN
Freq: Once | INTRAVENOUS | Status: AC
  Filled 2022-12-18: qty 250

## 2022-12-18 MED ORDER — PALONOSETRON HCL INJECTION 0.25 MG/5ML
0.2500 mg | Freq: Once | INTRAVENOUS | Status: AC
  Administered 2022-12-18: 0.25 mg via INTRAVENOUS
  Filled 2022-12-18: qty 5

## 2022-12-18 NOTE — Progress Notes (Signed)
[facility] CSW Progress Note  Visual merchandiser met with patient in infusion to assess needs.  Patient expressed her feelings regarding her cancer and treatment.  She appeared to respond positively to life review.  CSW provided active listening and supportive counseling.    Dorothey Baseman, LCSW Clinical Social Worker [facility]

## 2022-12-19 ENCOUNTER — Other Ambulatory Visit: Payer: Self-pay

## 2022-12-21 ENCOUNTER — Inpatient Hospital Stay: Payer: BC Managed Care – PPO

## 2022-12-21 DIAGNOSIS — C7951 Secondary malignant neoplasm of bone: Secondary | ICD-10-CM | POA: Diagnosis not present

## 2022-12-21 DIAGNOSIS — C7802 Secondary malignant neoplasm of left lung: Secondary | ICD-10-CM | POA: Diagnosis not present

## 2022-12-21 DIAGNOSIS — C221 Intrahepatic bile duct carcinoma: Secondary | ICD-10-CM | POA: Diagnosis not present

## 2022-12-21 DIAGNOSIS — T451X5A Adverse effect of antineoplastic and immunosuppressive drugs, initial encounter: Secondary | ICD-10-CM | POA: Diagnosis not present

## 2022-12-21 DIAGNOSIS — D6481 Anemia due to antineoplastic chemotherapy: Secondary | ICD-10-CM | POA: Diagnosis not present

## 2022-12-21 DIAGNOSIS — Z5111 Encounter for antineoplastic chemotherapy: Secondary | ICD-10-CM | POA: Diagnosis not present

## 2022-12-21 DIAGNOSIS — C7801 Secondary malignant neoplasm of right lung: Secondary | ICD-10-CM

## 2022-12-21 DIAGNOSIS — Z5112 Encounter for antineoplastic immunotherapy: Secondary | ICD-10-CM | POA: Diagnosis not present

## 2022-12-21 DIAGNOSIS — I10 Essential (primary) hypertension: Secondary | ICD-10-CM | POA: Diagnosis not present

## 2022-12-21 DIAGNOSIS — Z7952 Long term (current) use of systemic steroids: Secondary | ICD-10-CM | POA: Diagnosis not present

## 2022-12-21 DIAGNOSIS — Z79899 Other long term (current) drug therapy: Secondary | ICD-10-CM | POA: Diagnosis not present

## 2022-12-21 MED ORDER — PEGFILGRASTIM-JMDB 6 MG/0.6ML ~~LOC~~ SOSY
6.0000 mg | PREFILLED_SYRINGE | Freq: Once | SUBCUTANEOUS | Status: AC
  Administered 2022-12-21: 6 mg via SUBCUTANEOUS
  Filled 2022-12-21: qty 0.6

## 2022-12-25 ENCOUNTER — Telehealth: Payer: Self-pay

## 2022-12-25 NOTE — Telephone Encounter (Signed)
Spoke with [facility] and they confirmed that she has a scheduled appointment in their office on December 28, 2022 at 2:30 pm.

## 2022-12-28 DIAGNOSIS — M170 Bilateral primary osteoarthritis of knee: Secondary | ICD-10-CM | POA: Diagnosis not present

## 2022-12-29 ENCOUNTER — Encounter: Payer: Self-pay | Admitting: Internal Medicine

## 2022-12-31 ENCOUNTER — Encounter: Payer: Self-pay | Admitting: Internal Medicine

## 2022-12-31 ENCOUNTER — Inpatient Hospital Stay: Payer: BC Managed Care – PPO | Attending: Internal Medicine

## 2022-12-31 ENCOUNTER — Inpatient Hospital Stay: Payer: BC Managed Care – PPO | Admitting: Internal Medicine

## 2022-12-31 VITALS — BP 131/91 | HR 89 | Temp 97.4°F | Wt 239.0 lb

## 2022-12-31 DIAGNOSIS — C221 Intrahepatic bile duct carcinoma: Secondary | ICD-10-CM

## 2022-12-31 DIAGNOSIS — T451X5A Adverse effect of antineoplastic and immunosuppressive drugs, initial encounter: Secondary | ICD-10-CM | POA: Insufficient documentation

## 2022-12-31 DIAGNOSIS — Z5111 Encounter for antineoplastic chemotherapy: Secondary | ICD-10-CM | POA: Diagnosis not present

## 2022-12-31 DIAGNOSIS — D6481 Anemia due to antineoplastic chemotherapy: Secondary | ICD-10-CM

## 2022-12-31 DIAGNOSIS — Z79899 Other long term (current) drug therapy: Secondary | ICD-10-CM | POA: Diagnosis not present

## 2022-12-31 DIAGNOSIS — C7802 Secondary malignant neoplasm of left lung: Secondary | ICD-10-CM

## 2022-12-31 DIAGNOSIS — Z5112 Encounter for antineoplastic immunotherapy: Secondary | ICD-10-CM

## 2022-12-31 DIAGNOSIS — I10 Essential (primary) hypertension: Secondary | ICD-10-CM | POA: Diagnosis not present

## 2022-12-31 DIAGNOSIS — C7801 Secondary malignant neoplasm of right lung: Secondary | ICD-10-CM

## 2022-12-31 LAB — CMP (CANCER CENTER ONLY)
ALT: 23 U/L (ref 0–44)
AST: 28 U/L (ref 15–41)
Albumin: 3.9 g/dL (ref 3.5–5.0)
Alkaline Phosphatase: 140 U/L — ABNORMAL HIGH (ref 38–126)
Anion gap: 9 (ref 5–15)
BUN: 16 mg/dL (ref 6–20)
CO2: 22 mmol/L (ref 22–32)
Calcium: 8.7 mg/dL — ABNORMAL LOW (ref 8.9–10.3)
Chloride: 106 mmol/L (ref 98–111)
Creatinine: 0.79 mg/dL (ref 0.44–1.00)
GFR, Estimated: >60 mL/min (ref >60)
Glucose, Bld: 104 mg/dL — ABNORMAL HIGH (ref 70–99)
Potassium: 3.7 mmol/L (ref 3.5–5.1)
Sodium: 137 mmol/L (ref 135–145)
Total Bilirubin: 0.6 mg/dL (ref <1.2)
Total Protein: 7 g/dL (ref 6.5–8.1)

## 2022-12-31 LAB — MAGNESIUM: Magnesium: 1.8 mg/dL (ref 1.7–2.4)

## 2022-12-31 LAB — CBC WITH DIFFERENTIAL (CANCER CENTER ONLY)
Abs Immature Granulocytes: 0.9 10*3/uL — ABNORMAL HIGH (ref 0.00–0.07)
Basophils Absolute: 0.1 10*3/uL (ref 0.0–0.1)
Basophils Relative: 1 %
Eosinophils Absolute: 0.1 10*3/uL (ref 0.0–0.5)
Eosinophils Relative: 1 %
HCT: 23.2 % — ABNORMAL LOW (ref 36.0–46.0)
Hemoglobin: 7.6 g/dL — ABNORMAL LOW (ref 12.0–15.0)
Immature Granulocytes: 7 %
Lymphocytes Relative: 7 %
Lymphs Abs: 0.9 10*3/uL (ref 0.7–4.0)
MCH: 38.6 pg — ABNORMAL HIGH (ref 26.0–34.0)
MCHC: 32.8 g/dL (ref 30.0–36.0)
MCV: 117.8 fL — ABNORMAL HIGH (ref 80.0–100.0)
Monocytes Absolute: 0.6 10*3/uL (ref 0.1–1.0)
Monocytes Relative: 5 %
Neutro Abs: 9.8 10*3/uL — ABNORMAL HIGH (ref 1.7–7.7)
Neutrophils Relative %: 79 %
Platelet Count: 142 10*3/uL — ABNORMAL LOW (ref 150–400)
RBC: 1.97 MIL/uL — ABNORMAL LOW (ref 3.87–5.11)
RDW: 22.2 % — ABNORMAL HIGH (ref 11.5–15.5)
Smear Review: NORMAL
WBC Count: 12.2 10*3/uL — ABNORMAL HIGH (ref 4.0–10.5)
nRBC: 0.7 % — ABNORMAL HIGH (ref 0.0–0.2)

## 2022-12-31 LAB — TSH: TSH: 1.73 u[IU]/mL (ref 0.350–4.500)

## 2022-12-31 MED ORDER — HEPARIN SOD (PORK) LOCK FLUSH 100 UNIT/ML IV SOLN
500.0000 [IU] | Freq: Once | INTRAVENOUS | Status: AC
Start: 2022-12-31 — End: 2022-12-31
  Administered 2022-12-31: 500 [IU] via INTRAVENOUS
  Filled 2022-12-31: qty 5

## 2022-12-31 MED ORDER — DEXAMETHASONE 4 MG PO TABS: ORAL_TABLET | ORAL | 1 refills | Status: DC

## 2022-12-31 MED ORDER — SODIUM CHLORIDE 0.9% FLUSH
10.0000 mL | Freq: Once | INTRAVENOUS | Status: AC
Start: 2022-12-31 — End: 2022-12-31
  Administered 2022-12-31: 10 mL via INTRAVENOUS
  Filled 2022-12-31: qty 10

## 2022-12-31 MED FILL — Fosaprepitant Dimeglumine For IV Infusion 150 MG (Base Eq): INTRAVENOUS | Qty: 5 | Status: AC

## 2022-12-31 NOTE — Progress Notes (Signed)
Patient needs a refill on her dexamethasone. She did go to [facility] where they gave her a cortisone injection in her knee which she says it didn't really help.

## 2022-12-31 NOTE — Progress Notes (Signed)
[facility] CONSULT NOTE  Patient Care Team: Reubin Milan, MD as PCP - General (Internal Medicine) Glory Buff, RN as Oncology Nurse Navigator Michaelyn Barter, MD as Consulting Physician (Oncology)   CANCER STAGING   Cancer Staging  Cholangiocarcinoma [facility]) Staging form: Intrahepatic Bile Duct, AJCC 8th Edition - Clinical stage from 08/26/2022: Stage IV (cM1) - Signed by Michaelyn Barter, MD on 09/03/2022 Stage prefix: Initial diagnosis   ASSESSMENT & PLAN:  Vicki Phillips 57 y.o. 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  # Intrahepatic Cholangiocarcinoma, stage IV - s/p liver biopsy on 08/26/2022 showed metastatic moderately differentiated adenocarcinoma.  IHC stain positive for CK7 and CK20.  TTF-1, PAX8, GATA3 and CDX2 are negative.  Differentials include cholangiocarcinoma and pancreatic carcinoma.   Plan- -patient sought second opinion with Dr. Hulen Luster at [facility] who agreed with the treatment plan.  Continue with the current plan.  Has follow-up scheduled in January 2025.  -CT imaging after 4 cycles of treatment showed stable to mildly improved disease.   -Labs reviewed.  Hemoglobin 7.6 noted. Started her on Retacrit 40,000 units on 12/18/2022 on every 2-week regimen.  For now, I will proceed with cycle 6-day 1 of cisplatin and gemcitabine tomorrow.  Schedule for 1 unit of blood transfusion on Monday.  I will follow-up with her for cycle 6-day 8 in 1 week.  If she continues to have worsening anemia and not responding to Retacrit may have to cut down the dose of the chemo or even discontinue.  Functional status continues ECOG 0-1  -PD-L1 0%.  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 -Pretreatment CA 19-9 352 -> 130 ->164 ->122.  Will continue to trend.  # Chemo induced anemia -Iron panel consistent with anemia of chronic disease -Hemoglobin 7.6.  Continue with Retacrit  40,000 unit every 2-week (started on 12/18/2022) -Scheduled for 1 unit PRBC on Monday  # Shortness of breath -Stable.  Continue with albuterol inhaler  # Right knee pain -MRI right knee showing horizontal tear in the posterior horn median meniscus and moderate-sized Baker's cyst with suspected leak or rupture, tricompartmental osteoarthritis.   -Seen by Raechel Chute.  Had cortisone shot with mild improvement.  She will follow-up with them in 4 weeks.  # Hypertension -Continue with amlodipine 5 mg daily.   # Cough -Continue with Tussionex as needed.  # Poor appetite -Discussed about nutrition consult and appetite stimulant.  Would like to hold off  # History of sarcoidosis -Management per pulmonary -Never required treatment.  Orders Placed This Encounter  Procedures   Informed Consent Details: Physician/Practitioner Attestation; Transcribe to consent form and obtain patient signature    Standing Status:   Future    Standing Expiration Date:   12/31/2023    Order Specific Question:   Physician/Practitioner attestation of informed consent for blood and or blood product transfusion    Answer:   I, the physician/practitioner, attest that I have discussed with the patient the benefits, risks, side effects, alternatives, likelihood of achieving goals and potential problems during recovery for the procedure that I have provided informed consent.    Order Specific Question:   Product(s)    Answer:   All Product(s)   Care order/instruction    Transfuse Parameters    Standing Status:   Future    Standing Expiration Date:   12/31/2023   Hold Tube- Blood Bank    Standing Status:   Future  Standing Expiration Date:   12/31/2023   Type and screen         Standing Status:   Future    Standing Expiration Date:   12/31/2023   RTC in 1 week for MD visit, labs, cycle 6-day 8 of cisplatin and gemcitabine.  The total time spent in the appointment was 30 minutes encounter with patients including  review of chart and various tests results, discussions about plan of care and coordination of care plan   All questions were answered. The patient knows to call the clinic with any problems, questions or concerns. No barriers to learning was detected.  Michaelyn Barter, MD 11/7/20241:22 PM   HISTORY OF PRESENTING ILLNESS:  Vicki Phillips 57 y.o. 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  Patient reports she presented to ED with complaints of chest pain.  Had chest x-ray which showed pulmonary nodules was referred to pulmonary.  Records not available.  Was then seen by Dr. Aundria Rud on 08/05/2022.  CT chest showed innumerable pulmonary nodules of varying sizes throughout the lung.  Largest spiculated appearing nodule of anterior right upper lobe measuring 2 x 2 cm, at least 1 ill-defined hypodense lesion of the anterior left lobe of the liver.  Despite stated history of sarcoidosis, there are no specific or characteristic features of sarcoidosis with respect to distribution, morphology, associated fibrotic change, nor associated mediastinal lymphadenopathy. 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  She has history of sarcoidosis but was never on treatment.  Reports that she does not see doctors on a regular basis because she is had bad experiences.  Interval history Patient seen today prior to cycle cycle 6-day 1 of cisplatin and gemcitabine Her symptoms remain pretty much stable.  During the first 2 weeks of the treatment she feels better with the steroids.  And once the effects wean off she tends to feel more tired with reduced appetite.  Bowel movements tend to fluctuate between constipation and diarrhea.  Had cortisone shot with [facility] with mild  improvement in pain.  I have reviewed her chart and materials related to her cancer extensively and collaborated history with the patient. Summary of oncologic history is as follows: Oncology History  Cholangiocarcinoma (HCC)  08/26/2022 Cancer Staging   Staging form: Intrahepatic Bile Duct, AJCC 8th Edition - Clinical stage from 08/26/2022: Stage IV (cM1) - Signed by Michaelyn Barter, MD on 09/03/2022 Stage prefix: Initial diagnosis   09/03/2022 Initial Diagnosis   Cholangiocarcinoma (HCC)   09/18/2022 -  Chemotherapy   Patient is on Treatment Plan : BILIARY TRACT Cisplatin + Gemcitabine D1,8 + Durvalumab (1500) D1 q21d / Durvalumab (1500) q28d     Metastasis to lung (HCC)  09/03/2022 Initial Diagnosis   Metastasis to lung (HCC)   09/18/2022 -  Chemotherapy   Patient is on Treatment Plan : BILIARY TRACT Cisplatin + Gemcitabine D1,8 + Durvalumab (1500) D1 q21d / Durvalumab (1500) q28d       MEDICAL HISTORY:  Past Medical History:  Diagnosis Date   Allergy    Sarcoidosis 2011    SURGICAL HISTORY: Past Surgical History:  Procedure Laterality Date   CESAREAN SECTION     x 2   COLONOSCOPY WITH PROPOFOL N/A 08/10/2019   Procedure: COLONOSCOPY WITH PROPOFOL;  Surgeon: Midge Minium, MD;  Location: [facility];  Service: Endoscopy;  Laterality: N/A;  priority 4   IR IMAGING GUIDED PORT INSERTION  09/11/2022   LUNG BIOPSY      SOCIAL HISTORY: Social History   Socioeconomic History   Marital status: Single    Spouse name: Not on file   Number of children: 2   Years of education: Not on file   Highest education level: Not on file  Occupational History   Not on file  Tobacco Use   Smoking status: Never   Smokeless tobacco: Never  Vaping Use   Vaping status: Never Used  Substance and Sexual Activity   Alcohol use: Yes    Alcohol/week: 10.0 standard drinks of alcohol    Types: 10 Standard drinks or equivalent per week   Drug use: No   Sexual activity: Yes    Partners:  Male    Birth control/protection: Surgical  Other Topics Concern   Not on file  Social History Narrative   Not on file   Social Determinants of Health   Financial Resource Strain: Low Risk  (09/18/2022)   Overall Financial Resource Strain (CARDIA)    Difficulty of Paying Living Expenses: Not hard at all  Food Insecurity: No Food Insecurity (08/24/2022)   Hunger Vital Sign    Worried About Running Out of Food in the Last Year: Never true    Ran Out of Food in the Last Year: Never true  Transportation Needs: No Transportation Needs (08/24/2022)   PRAPARE - Administrator, Civil Service (Medical): No    Lack of Transportation (Non-Medical): No  Physical Activity: Not on file  Stress: Not on file  Social Connections: Not on file  Intimate Partner Violence: Not At Risk (08/24/2022)   Humiliation, Afraid, Rape, and Kick questionnaire    Fear of Current or Ex-Partner: No    Emotionally Abused: No    Physically Abused: No    Sexually Abused: No    FAMILY HISTORY: Family History  Problem Relation Age of Onset   Stroke Mother    Heart disease Mother    Heart attack Father 42   Heart disease Father    Kidney disease Sister     ALLERGIES:  is allergic to other.  MEDICATIONS:  Current Outpatient Medications  Medication Sig Dispense Refill   albuterol (VENTOLIN HFA) 108 (90 Base) MCG/ACT inhaler Inhale 2 puffs into the lungs every 6 (six) hours as needed for wheezing or shortness of breath. 8 g 0   amLODipine (NORVASC) 5 MG tablet Take 1 tablet (5 mg total) by mouth daily. 90 tablet 0   chlorpheniramine-HYDROcodone (TUSSIONEX) 10-8 MG/5ML Take 5 mLs by mouth at bedtime as needed for cough. 140 mL 0   dexamethasone (DECADRON) 4 MG tablet Take 2 tablets (8 mg) by mouth daily x 3 days starting the day after cisplatin chemotherapy. Take with food. 30 tablet 1   loratadine (CLARITIN) 10 MG tablet Take 10 mg by mouth daily.     pseudoephedrine-acetaminophen (TYLENOL SINUS) 30-500  MG TABS tablet Take 1 tablet by mouth in the morning and at bedtime.     ondansetron (ZOFRAN) 8 MG tablet Take 1 tablet (8 mg total) by mouth every 8 (eight) hours as needed for nausea or vomiting. Start on the third day after cisplatin. (Patient not taking: Reported on 11/26/2022) 30 tablet 1   prochlorperazine (COMPAZINE) 10 MG tablet Take 1 tablet (10 mg total) by mouth every 6 (six) hours as needed (Nausea or vomiting). (Patient not  taking: Reported on 11/05/2022) 30 tablet 1   No current facility-administered medications for this visit.    REVIEW OF SYSTEMS:   Pertinent information mentioned in HPI All other systems were reviewed with the patient and are negative.  PHYSICAL EXAMINATION: ECOG PERFORMANCE STATUS: 1 - Symptomatic but completely ambulatory  Vitals:   12/31/22 0941  BP: (!) 131/91  Pulse: 89  Temp: (!) 97.4 F (36.3 C)  SpO2: 99%     Filed Weights   12/31/22 0941  Weight: 239 lb (108.4 kg)      GENERAL:alert, no distress and comfortable SKIN: skin color, texture, turgor are normal, no rashes or significant lesions EYES: normal, conjunctiva are pink and non-injected, sclera clear OROPHARYNX:no exudate, no erythema and lips, buccal mucosa, and tongue normal  NECK: supple, thyroid normal size, non-tender, without nodularity LYMPH:  no palpable lymphadenopathy in the cervical, axillary or inguinal LUNGS: clear to auscultation and percussion with normal breathing effort HEART: regular rate & rhythm and no murmurs and no lower extremity edema ABDOMEN:abdomen soft, non-tender and normal bowel sounds Musculoskeletal:no cyanosis of digits and no clubbing  PSYCH: alert & oriented x 3 with fluent speech NEURO: no focal motor/sensory deficits  LABORATORY DATA:  I have reviewed the data as listed Lab Results  Component Value Date   WBC 12.2 (H) 12/31/2022   HGB 7.6 (L) 12/31/2022   HCT 23.2 (L) 12/31/2022   MCV 117.8 (H) 12/31/2022   PLT 142 (L) 12/31/2022    Recent Labs    12/10/22 0919 12/17/22 0955 12/31/22 0925  NA 137 135 137  K 4.2 4.1 3.7  CL 107 103 106  CO2 24 26 22   GLUCOSE 107* 108* 104*  BUN 15 18 16   CREATININE 0.75 0.64 0.79  CALCIUM 8.8* 9.0 8.7*  GFRNONAA >60 >60 >60  PROT 6.9 6.7 7.0  ALBUMIN 4.1 3.8 3.9  AST 27 36 28  ALT 22 63* 23  ALKPHOS 150* 105 140*  BILITOT 0.3 0.6 0.6    RADIOGRAPHIC STUDIES: I have personally reviewed the radiological images as listed and agreed with the findings in the report. No results found.

## 2023-01-01 ENCOUNTER — Encounter: Payer: Self-pay | Admitting: Internal Medicine

## 2023-01-01 ENCOUNTER — Inpatient Hospital Stay: Payer: BC Managed Care – PPO

## 2023-01-01 ENCOUNTER — Other Ambulatory Visit: Payer: Self-pay | Admitting: *Deleted

## 2023-01-01 VITALS — BP 154/94 | HR 98 | Temp 98.7°F | Resp 18

## 2023-01-01 VITALS — BP 149/98 | HR 98 | Temp 99.4°F | Resp 17

## 2023-01-01 DIAGNOSIS — D6481 Anemia due to antineoplastic chemotherapy: Secondary | ICD-10-CM | POA: Diagnosis not present

## 2023-01-01 DIAGNOSIS — C7801 Secondary malignant neoplasm of right lung: Secondary | ICD-10-CM

## 2023-01-01 DIAGNOSIS — C221 Intrahepatic bile duct carcinoma: Secondary | ICD-10-CM

## 2023-01-01 DIAGNOSIS — I10 Essential (primary) hypertension: Secondary | ICD-10-CM | POA: Diagnosis not present

## 2023-01-01 DIAGNOSIS — Z5111 Encounter for antineoplastic chemotherapy: Secondary | ICD-10-CM | POA: Diagnosis not present

## 2023-01-01 DIAGNOSIS — Z79899 Other long term (current) drug therapy: Secondary | ICD-10-CM | POA: Diagnosis not present

## 2023-01-01 DIAGNOSIS — T451X5A Adverse effect of antineoplastic and immunosuppressive drugs, initial encounter: Secondary | ICD-10-CM | POA: Diagnosis not present

## 2023-01-01 LAB — SAMPLE TO BLOOD BANK

## 2023-01-01 LAB — PREPARE RBC (CROSSMATCH)

## 2023-01-01 LAB — T4: T4, Total: 8.6 ug/dL (ref 4.5–12.0)

## 2023-01-01 MED ORDER — SODIUM CHLORIDE 0.9 % IV SOLN
1000.0000 mg/m2 | Freq: Once | INTRAVENOUS | Status: AC
  Administered 2023-01-01: 2318 mg via INTRAVENOUS
  Filled 2023-01-01: qty 60.96

## 2023-01-01 MED ORDER — MAGNESIUM SULFATE 2 GM/50ML IV SOLN
2.0000 g | Freq: Once | INTRAVENOUS | Status: AC
  Administered 2023-01-01: 2 g via INTRAVENOUS
  Filled 2023-01-01: qty 50

## 2023-01-01 MED ORDER — SODIUM CHLORIDE 0.9 % IV SOLN
25.0000 mg/m2 | Freq: Once | INTRAVENOUS | Status: AC
  Administered 2023-01-01: 58 mg via INTRAVENOUS
  Filled 2023-01-01: qty 58

## 2023-01-01 MED ORDER — DEXAMETHASONE SODIUM PHOSPHATE 10 MG/ML IJ SOLN
10.0000 mg | Freq: Once | INTRAMUSCULAR | Status: AC
Start: 2023-01-01 — End: 2023-01-01
  Administered 2023-01-01: 10 mg via INTRAVENOUS
  Filled 2023-01-01: qty 1

## 2023-01-01 MED ORDER — EPOETIN ALFA-EPBX 40000 UNIT/ML IJ SOLN
40000.0000 [IU] | Freq: Once | INTRAMUSCULAR | Status: AC
Start: 2023-01-01 — End: 2023-01-01
  Administered 2023-01-01: 40000 [IU] via SUBCUTANEOUS
  Filled 2023-01-01: qty 1

## 2023-01-01 MED ORDER — HEPARIN SOD (PORK) LOCK FLUSH 100 UNIT/ML IV SOLN
500.0000 [IU] | Freq: Once | INTRAVENOUS | Status: AC | PRN
  Administered 2023-01-01: 500 [IU]
  Filled 2023-01-01: qty 5

## 2023-01-01 MED ORDER — SODIUM CHLORIDE 0.9 % IV SOLN
150.0000 mg | Freq: Once | INTRAVENOUS | Status: AC
  Administered 2023-01-01: 150 mg via INTRAVENOUS
  Filled 2023-01-01: qty 150

## 2023-01-01 MED ORDER — SODIUM CHLORIDE 0.9 % IV SOLN
1500.0000 mg | Freq: Once | INTRAVENOUS | Status: AC
  Administered 2023-01-01: 1500 mg via INTRAVENOUS
  Filled 2023-01-01: qty 30

## 2023-01-01 MED ORDER — SODIUM CHLORIDE 0.9 % IV SOLN
Freq: Once | INTRAVENOUS | Status: AC
  Filled 2023-01-01: qty 250

## 2023-01-01 MED ORDER — PALONOSETRON HCL INJECTION 0.25 MG/5ML
0.2500 mg | Freq: Once | INTRAVENOUS | Status: AC
Start: 2023-01-01 — End: 2023-01-01
  Administered 2023-01-01: 0.25 mg via INTRAVENOUS
  Filled 2023-01-01: qty 5

## 2023-01-01 MED ORDER — POTASSIUM CHLORIDE IN NACL 20-0.9 MEQ/L-% IV SOLN
Freq: Once | INTRAVENOUS | Status: AC
  Filled 2023-01-01: qty 1000

## 2023-01-01 NOTE — Patient Instructions (Signed)
[facility] - A DEPT OF [facility]  Discharge Instructions: Thank you for choosing [facility] to provide your oncology and hematology care.  If you have a lab appointment with the Cancer Center, please go directly to the Cancer Center and check in at the registration area.  Wear comfortable clothing and clothing appropriate for easy access to any Portacath or PICC line.   We strive to give you quality time with your provider. You may need to reschedule your appointment if you arrive late (15 or more minutes).  Arriving late affects you and other patients whose appointments are after yours.  Also, if you miss three or more appointments without notifying the office, you may be dismissed from the clinic at the provider's discretion.      For prescription refill requests, have your pharmacy contact our office and allow 72 hours for refills to be completed.    Today you received the following chemotherapy and/or immunotherapy agents imfinzi, gemzar, cisplatin      To help prevent nausea and vomiting after your treatment, we encourage you to take your nausea medication as directed.  BELOW ARE SYMPTOMS THAT SHOULD BE REPORTED IMMEDIATELY: *FEVER GREATER THAN 100.4 F (38 C) OR HIGHER *CHILLS OR SWEATING *NAUSEA AND VOMITING THAT IS NOT CONTROLLED WITH YOUR NAUSEA MEDICATION *UNUSUAL SHORTNESS OF BREATH *UNUSUAL BRUISING OR BLEEDING *URINARY PROBLEMS (pain or burning when urinating, or frequent urination) *BOWEL PROBLEMS (unusual diarrhea, constipation, pain near the anus) TENDERNESS IN MOUTH AND THROAT WITH OR WITHOUT PRESENCE OF ULCERS (sore throat, sores in mouth, or a toothache) UNUSUAL RASH, SWELLING OR PAIN  UNUSUAL VAGINAL DISCHARGE OR ITCHING   Items with * indicate a potential emergency and should be followed up as soon as possible or go to the Emergency Department if any problems should occur.  Please show the CHEMOTHERAPY ALERT CARD or  IMMUNOTHERAPY ALERT CARD at check-in to the Emergency Department and triage nurse.  Should you have questions after your visit or need to cancel or reschedule your appointment, please contact [facility] - A DEPT OF [facility]  [phone] and follow the prompts.  Office hours are 8:00 a.m. to 4:30 p.m. Monday - Friday. Please note that voicemails left after 4:00 p.m. may not be returned until the following business day.  We are closed weekends and major holidays. You have access to a nurse at all times for urgent questions. Please call the main number to the clinic [phone] and follow the prompts.  For any non-urgent questions, you may also contact your provider using MyChart. We now offer e-Visits for anyone 53 and older to request care online for non-urgent symptoms. For details visit [email or web address].   Also download the MyChart app! Go to the app store, search "MyChart", open the app, select [facility], and log in with your MyChart username and password.

## 2023-01-01 NOTE — Addendum Note (Signed)
Addended byMichaelyn Barter on: 01/01/2023 11:04 AM   Modules accepted: Orders

## 2023-01-02 LAB — ABO/RH: ABO/RH(D): A POS

## 2023-01-04 ENCOUNTER — Inpatient Hospital Stay: Payer: BC Managed Care – PPO

## 2023-01-04 ENCOUNTER — Other Ambulatory Visit: Payer: Self-pay | Admitting: Internal Medicine

## 2023-01-04 ENCOUNTER — Encounter: Payer: Self-pay | Admitting: Internal Medicine

## 2023-01-04 DIAGNOSIS — C7801 Secondary malignant neoplasm of right lung: Secondary | ICD-10-CM

## 2023-01-04 DIAGNOSIS — T451X5A Adverse effect of antineoplastic and immunosuppressive drugs, initial encounter: Secondary | ICD-10-CM | POA: Diagnosis not present

## 2023-01-04 DIAGNOSIS — Z79899 Other long term (current) drug therapy: Secondary | ICD-10-CM | POA: Diagnosis not present

## 2023-01-04 DIAGNOSIS — C221 Intrahepatic bile duct carcinoma: Secondary | ICD-10-CM

## 2023-01-04 DIAGNOSIS — Z5111 Encounter for antineoplastic chemotherapy: Secondary | ICD-10-CM | POA: Diagnosis not present

## 2023-01-04 DIAGNOSIS — I10 Essential (primary) hypertension: Secondary | ICD-10-CM | POA: Diagnosis not present

## 2023-01-04 DIAGNOSIS — D6481 Anemia due to antineoplastic chemotherapy: Secondary | ICD-10-CM | POA: Diagnosis not present

## 2023-01-04 MED ORDER — SODIUM CHLORIDE 0.9% IV SOLUTION
250.0000 mL | INTRAVENOUS | Status: DC
  Administered 2023-01-04: 100 mL via INTRAVENOUS
  Filled 2023-01-04: qty 250

## 2023-01-04 MED ORDER — HEPARIN SOD (PORK) LOCK FLUSH 100 UNIT/ML IV SOLN
500.0000 [IU] | Freq: Every day | INTRAVENOUS | Status: AC | PRN
  Administered 2023-01-04: 500 [IU]
  Filled 2023-01-04: qty 5

## 2023-01-04 MED ORDER — SODIUM CHLORIDE 0.9% FLUSH
10.0000 mL | INTRAVENOUS | Status: AC | PRN
  Administered 2023-01-04: 10 mL
  Filled 2023-01-04: qty 10

## 2023-01-04 NOTE — Patient Instructions (Signed)

## 2023-01-05 LAB — BPAM RBC
Blood Product Expiration Date: 202412062359
ISSUE DATE / TIME: 202411111341
Unit Type and Rh: 6200
Unit Type and Rh: 6200

## 2023-01-05 LAB — TYPE AND SCREEN
ABO/RH(D): A POS
Antibody Screen: NEGATIVE
Unit division: 0

## 2023-01-06 ENCOUNTER — Encounter: Payer: Self-pay | Admitting: Internal Medicine

## 2023-01-07 ENCOUNTER — Encounter: Payer: Self-pay | Admitting: Internal Medicine

## 2023-01-07 ENCOUNTER — Inpatient Hospital Stay: Payer: BC Managed Care – PPO | Admitting: Internal Medicine

## 2023-01-07 ENCOUNTER — Inpatient Hospital Stay: Payer: BC Managed Care – PPO

## 2023-01-07 ENCOUNTER — Telehealth: Payer: Self-pay | Admitting: *Deleted

## 2023-01-07 VITALS — BP 139/89 | HR 98 | Temp 97.8°F | Wt 240.0 lb

## 2023-01-07 DIAGNOSIS — C7801 Secondary malignant neoplasm of right lung: Secondary | ICD-10-CM

## 2023-01-07 DIAGNOSIS — Z79899 Other long term (current) drug therapy: Secondary | ICD-10-CM | POA: Diagnosis not present

## 2023-01-07 DIAGNOSIS — T451X5A Adverse effect of antineoplastic and immunosuppressive drugs, initial encounter: Secondary | ICD-10-CM

## 2023-01-07 DIAGNOSIS — C7802 Secondary malignant neoplasm of left lung: Secondary | ICD-10-CM

## 2023-01-07 DIAGNOSIS — Z5111 Encounter for antineoplastic chemotherapy: Secondary | ICD-10-CM | POA: Diagnosis not present

## 2023-01-07 DIAGNOSIS — R051 Acute cough: Secondary | ICD-10-CM | POA: Diagnosis not present

## 2023-01-07 DIAGNOSIS — C221 Intrahepatic bile duct carcinoma: Secondary | ICD-10-CM

## 2023-01-07 DIAGNOSIS — I10 Essential (primary) hypertension: Secondary | ICD-10-CM | POA: Diagnosis not present

## 2023-01-07 DIAGNOSIS — D6481 Anemia due to antineoplastic chemotherapy: Secondary | ICD-10-CM

## 2023-01-07 LAB — CMP (CANCER CENTER ONLY)
ALT: 56 U/L — ABNORMAL HIGH (ref 0–44)
AST: 37 U/L (ref 15–41)
Albumin: 3.8 g/dL (ref 3.5–5.0)
Alkaline Phosphatase: 99 U/L (ref 38–126)
Anion gap: 8 (ref 5–15)
BUN: 17 mg/dL (ref 6–20)
CO2: 25 mmol/L (ref 22–32)
Calcium: 9.3 mg/dL (ref 8.9–10.3)
Chloride: 102 mmol/L (ref 98–111)
Creatinine: 0.57 mg/dL (ref 0.44–1.00)
GFR, Estimated: >60 mL/min (ref >60)
Glucose, Bld: 102 mg/dL — ABNORMAL HIGH (ref 70–99)
Potassium: 4.1 mmol/L (ref 3.5–5.1)
Sodium: 135 mmol/L (ref 135–145)
Total Bilirubin: 0.5 mg/dL (ref <1.2)
Total Protein: 6.8 g/dL (ref 6.5–8.1)

## 2023-01-07 LAB — CBC WITH DIFFERENTIAL (CANCER CENTER ONLY)
Abs Immature Granulocytes: 0.02 10*3/uL (ref 0.00–0.07)
Basophils Absolute: 0 10*3/uL (ref 0.0–0.1)
Basophils Relative: 1 %
Eosinophils Absolute: 0 10*3/uL (ref 0.0–0.5)
Eosinophils Relative: 1 %
HCT: 25.1 % — ABNORMAL LOW (ref 36.0–46.0)
Hemoglobin: 8.4 g/dL — ABNORMAL LOW (ref 12.0–15.0)
Immature Granulocytes: 1 %
Lymphocytes Relative: 26 %
Lymphs Abs: 0.6 10*3/uL — ABNORMAL LOW (ref 0.7–4.0)
MCH: 37.2 pg — ABNORMAL HIGH (ref 26.0–34.0)
MCHC: 33.5 g/dL (ref 30.0–36.0)
MCV: 111.1 fL — ABNORMAL HIGH (ref 80.0–100.0)
Monocytes Absolute: 0.2 10*3/uL (ref 0.1–1.0)
Monocytes Relative: 9 %
Neutro Abs: 1.3 10*3/uL — ABNORMAL LOW (ref 1.7–7.7)
Neutrophils Relative %: 62 %
Platelet Count: 124 10*3/uL — ABNORMAL LOW (ref 150–400)
RBC: 2.26 MIL/uL — ABNORMAL LOW (ref 3.87–5.11)
RDW: 21.2 % — ABNORMAL HIGH (ref 11.5–15.5)
WBC Count: 2.1 10*3/uL — ABNORMAL LOW (ref 4.0–10.5)
nRBC: 0 % (ref 0.0–0.2)

## 2023-01-07 LAB — SAMPLE TO BLOOD BANK

## 2023-01-07 LAB — MAGNESIUM: Magnesium: 1.9 mg/dL (ref 1.7–2.4)

## 2023-01-07 MED ORDER — SODIUM CHLORIDE 0.9% FLUSH
10.0000 mL | INTRAVENOUS | Status: DC | PRN
  Administered 2023-01-07: 10 mL via INTRAVENOUS
  Filled 2023-01-07: qty 10

## 2023-01-07 MED ORDER — HEPARIN SOD (PORK) LOCK FLUSH 100 UNIT/ML IV SOLN
500.0000 [IU] | Freq: Once | INTRAVENOUS | Status: AC
  Administered 2023-01-07: 500 [IU] via INTRAVENOUS
  Filled 2023-01-07: qty 5

## 2023-01-07 MED ORDER — HYDROCOD POLI-CHLORPHE POLI ER 10-8 MG/5ML PO SUER: 5.0000 mL | Freq: Every evening | ORAL | 0 refills | Status: DC | PRN

## 2023-01-07 MED FILL — Fosaprepitant Dimeglumine For IV Infusion 150 MG (Base Eq): INTRAVENOUS | Qty: 5 | Status: AC

## 2023-01-07 NOTE — Progress Notes (Signed)
Patient has been having a little trouble sleeping at night which started about 3 days ago.

## 2023-01-07 NOTE — Progress Notes (Signed)
[facility] CONSULT NOTE  Patient Care Team: Reubin Milan, MD as PCP - General (Internal Medicine) Glory Buff, RN as Oncology Nurse Navigator Michaelyn Barter, MD as Consulting Physician (Oncology)   CANCER STAGING   Cancer Staging  Cholangiocarcinoma [facility]) Staging form: Intrahepatic Bile Duct, AJCC 8th Edition - Clinical stage from 08/26/2022: Stage IV (cM1) - Signed by Michaelyn Barter, MD on 09/03/2022 Stage prefix: Initial diagnosis   ASSESSMENT & PLAN:  Vicki Phillips 57 y.o. 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  # Intrahepatic Cholangiocarcinoma, stage IV - s/p liver biopsy on 08/26/2022 showed metastatic moderately differentiated adenocarcinoma.  IHC stain positive for CK7 and CK20.  TTF-1, PAX8, GATA3 and CDX2 are negative.  Differentials include cholangiocarcinoma and pancreatic carcinoma.   Plan- -patient sought second opinion with Dr. Hulen Luster at [facility] who agreed with the treatment plan.  Continue with the current plan.  Has follow-up scheduled in January 2025.  -CT imaging after 4 cycles of treatment showed stable to mildly improved disease.   -PD-L1 0%.  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 -Pretreatment CA 19-9 352 -> 130 ->164 ->122.  Will continue to trend. -Labs reviewed and acceptable for treatment.  Hemoglobin today is 8.4.  ANC of 1.3 noted.  Will proceed with cycle 6-day 8 of gemcitabine tomorrow.  She will come back on Monday for Neupogen injection.  We discussed if her hemoglobin trends down again, I will consider doing 20% dose reduction with gemcitabine.  # Chemotherapy related  anemia -Iron panel consistent with anemia of chronic disease -Hemoglobin 7.6.  Continue with Retacrit 40,000 unit every 2-week (started on 12/18/2022) -s/p 1 U PRBC on 01/05/23.  # Shortness of breath -Stable.  Continue with albuterol inhaler  # Right knee  pain -MRI right knee showing horizontal tear in the posterior horn median meniscus and moderate-sized Baker's cyst with suspected leak or rupture, tricompartmental osteoarthritis.   -Seen by Raechel Chute.  Had cortisone shot with mild improvement.  She will follow-up with them in 4 months.  # Hypertension -Continue with amlodipine 5 mg daily.   # Cough -Continue with Tussionex as needed.  # Poor appetite -Discussed about nutrition consult and appetite stimulant.  Would like to hold off  # History of sarcoidosis -Management per pulmonary -Never required treatment.  Orders Placed This Encounter  Procedures   CBC with Differential (Cancer Center Only)    Standing Status:   Future    Standing Expiration Date:   01/28/2024   CMP (Cancer Center only)    Standing Status:   Future    Standing Expiration Date:   01/28/2024   Magnesium    Standing Status:   Future    Standing Expiration Date:   01/28/2024   CBC with Differential (Cancer Center Only)    Standing Status:   Future    Standing Expiration Date:   02/04/2024   CMP (Cancer Center only)    Standing Status:   Future    Standing Expiration Date:   02/04/2024   Magnesium    Standing Status:   Future    Standing Expiration Date:   02/04/2024   RTC in 2 weeks for MD visit, labs, cycle 7-day 1 of cisplatin, gemcitabine and Durvalumab.  Treatment will have to be adjusted due to Thanksgiving week.  The total time spent in the appointment was 30 minutes encounter with patients including review of chart and various tests results, discussions  about plan of care and coordination of care plan   All questions were answered. The patient knows to call the clinic with any problems, questions or concerns. No barriers to learning was detected.  Michaelyn Barter, MD 11/14/202411:04 AM   HISTORY OF PRESENTING ILLNESS:  Vicki Phillips 57 y.o. female with pmh of sarcoidosis not on any treatment was referred to oncology for multiple pulmonary  nodules and liver lesion.  Patient reports she presented to ED with complaints of chest pain.  Had chest x-ray which showed pulmonary nodules was referred to pulmonary.  Records not available.  Was then seen by Dr. Aundria Rud on 08/05/2022.  CT chest showed innumerable pulmonary nodules of varying sizes throughout the lung.  Largest spiculated appearing nodule of anterior right upper lobe measuring 2 x 2 cm, at least 1 ill-defined hypodense lesion of the anterior left lobe of the liver.  Despite stated history of sarcoidosis, there are no specific or characteristic features of sarcoidosis with respect to distribution, morphology, associated fibrotic change, nor associated mediastinal lymphadenopathy. 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  She has history of sarcoidosis but was never on treatment.  Reports that she does not see doctors on a regular basis because she is had bad experiences.  Interval history Patient seen today prior to cycle 6-day 8 of gemcitabine. Reports difficulty with sleep for the past 3 to 4 days.  Has stressors.  We discussed about melatonin.  Otherwise overall she has been doing okay.  She was able to take the stairs today without getting out of breath.  Using albuterol inhaler as needed.  Requesting for refill on cough medication.  Denies any nausea.  Her sinuses are under control.  I have reviewed her chart and materials related to her cancer extensively and collaborated history with the patient. Summary of oncologic history is as follows: Oncology History  Cholangiocarcinoma (HCC)  08/26/2022 Cancer Staging   Staging form: Intrahepatic Bile Duct, AJCC 8th Edition - Clinical stage from 08/26/2022: Stage IV (cM1) - Signed by Michaelyn Barter, MD on 09/03/2022 Stage prefix: Initial diagnosis   09/03/2022 Initial Diagnosis    Cholangiocarcinoma (HCC)   09/18/2022 -  Chemotherapy   Patient is on Treatment Plan : BILIARY TRACT Cisplatin + Gemcitabine D1,8 + Durvalumab (1500) D1 q21d / Durvalumab (1500) q28d     Metastasis to lung (HCC)  09/03/2022 Initial Diagnosis   Metastasis to lung (HCC)   09/18/2022 -  Chemotherapy   Patient is on Treatment Plan : BILIARY TRACT Cisplatin + Gemcitabine D1,8 + Durvalumab (1500) D1 q21d / Durvalumab (1500) q28d       MEDICAL HISTORY:  Past Medical History:  Diagnosis Date   Allergy    Sarcoidosis 2011    SURGICAL HISTORY: Past Surgical History:  Procedure Laterality Date   CESAREAN SECTION     x 2   COLONOSCOPY WITH PROPOFOL N/A 08/10/2019   Procedure: COLONOSCOPY WITH PROPOFOL;  Surgeon: Midge Minium, MD;  Location: [facility];  Service: Endoscopy;  Laterality: N/A;  priority 4   IR IMAGING GUIDED PORT INSERTION  09/11/2022   LUNG BIOPSY      SOCIAL HISTORY: Social History   Socioeconomic History   Marital status: Single    Spouse name: Not on file   Number of children: 2   Years of education: Not on file   Highest education level: Not on  file  Occupational History   Not on file  Tobacco Use   Smoking status: Never   Smokeless tobacco: Never  Vaping Use   Vaping status: Never Used  Substance and Sexual Activity   Alcohol use: Yes    Alcohol/week: 10.0 standard drinks of alcohol    Types: 10 Standard drinks or equivalent per week   Drug use: No   Sexual activity: Yes    Partners: Male    Birth control/protection: Surgical  Other Topics Concern   Not on file  Social History Narrative   Not on file   Social Determinants of Health   Financial Resource Strain: Low Risk  (09/18/2022)   Overall Financial Resource Strain (CARDIA)    Difficulty of Paying Living Expenses: Not hard at all  Food Insecurity: No Food Insecurity (08/24/2022)   Hunger Vital Sign    Worried About Running Out of Food in the Last Year: Never true    Ran Out of Food in  the Last Year: Never true  Transportation Needs: No Transportation Needs (08/24/2022)   PRAPARE - Administrator, Civil Service (Medical): No    Lack of Transportation (Non-Medical): No  Physical Activity: Not on file  Stress: Not on file  Social Connections: Not on file  Intimate Partner Violence: Not At Risk (08/24/2022)   Humiliation, Afraid, Rape, and Kick questionnaire    Fear of Current or Ex-Partner: No    Emotionally Abused: No    Physically Abused: No    Sexually Abused: No    FAMILY HISTORY: Family History  Problem Relation Age of Onset   Stroke Mother    Heart disease Mother    Heart attack Father 32   Heart disease Father    Kidney disease Sister     ALLERGIES:  is allergic to other.  MEDICATIONS:  Current Outpatient Medications  Medication Sig Dispense Refill   albuterol (VENTOLIN HFA) 108 (90 Base) MCG/ACT inhaler Inhale 2 puffs into the lungs every 6 (six) hours as needed for wheezing or shortness of breath. 8 g 0   amLODipine (NORVASC) 5 MG tablet Take 1 tablet (5 mg total) by mouth daily. 90 tablet 0   chlorpheniramine-HYDROcodone (TUSSIONEX) 10-8 MG/5ML Take 5 mLs by mouth at bedtime as needed for cough. 140 mL 0   dexamethasone (DECADRON) 4 MG tablet Take 2 tablets (8 mg) by mouth daily x 3 days starting the day after cisplatin chemotherapy. Take with food. 30 tablet 1   loratadine (CLARITIN) 10 MG tablet Take 10 mg by mouth daily.     ondansetron (ZOFRAN) 8 MG tablet Take 1 tablet (8 mg total) by mouth every 8 (eight) hours as needed for nausea or vomiting. Start on the third day after cisplatin. (Patient not taking: Reported on 11/26/2022) 30 tablet 1   prochlorperazine (COMPAZINE) 10 MG tablet Take 1 tablet (10 mg total) by mouth every 6 (six) hours as needed (Nausea or vomiting). (Patient not taking: Reported on 11/05/2022) 30 tablet 1   pseudoephedrine-acetaminophen (TYLENOL SINUS) 30-500 MG TABS tablet Take 1 tablet by mouth in the morning and at  bedtime.     No current facility-administered medications for this visit.   Facility-Administered Medications Ordered in Other Visits  Medication Dose Route Frequency Provider Last Rate Last Admin   sodium chloride flush (NS) 0.9 % injection 10 mL  10 mL Intravenous PRN Michaelyn Barter, MD   10 mL at 01/07/23 0951    REVIEW OF SYSTEMS:   Pertinent information  mentioned in HPI All other systems were reviewed with the patient and are negative.  PHYSICAL EXAMINATION: ECOG PERFORMANCE STATUS: 1 - Symptomatic but completely ambulatory  Vitals:   01/07/23 1005  BP: 139/89  Pulse: 98  Temp: 97.8 F (36.6 C)  SpO2: 99%     Filed Weights   01/07/23 1005  Weight: 240 lb (108.9 kg)      GENERAL:alert, no distress and comfortable SKIN: skin color, texture, turgor are normal, no rashes or significant lesions EYES: normal, conjunctiva are pink and non-injected, sclera clear OROPHARYNX:no exudate, no erythema and lips, buccal mucosa, and tongue normal  NECK: supple, thyroid normal size, non-tender, without nodularity LYMPH:  no palpable lymphadenopathy in the cervical, axillary or inguinal LUNGS: clear to auscultation and percussion with normal breathing effort HEART: regular rate & rhythm and no murmurs and no lower extremity edema ABDOMEN:abdomen soft, non-tender and normal bowel sounds Musculoskeletal:no cyanosis of digits and no clubbing  PSYCH: alert & oriented x 3 with fluent speech NEURO: no focal motor/sensory deficits  LABORATORY DATA:  I have reviewed the data as listed Lab Results  Component Value Date   WBC 2.1 (L) 01/07/2023   HGB 8.4 (L) 01/07/2023   HCT 25.1 (L) 01/07/2023   MCV 111.1 (H) 01/07/2023   PLT 124 (L) 01/07/2023   Recent Labs    12/17/22 0955 12/31/22 0925 01/07/23 0935  NA 135 137 135  K 4.1 3.7 4.1  CL 103 106 102  CO2 26 22 25   GLUCOSE 108* 104* 102*  BUN 18 16 17   CREATININE 0.64 0.79 0.57  CALCIUM 9.0 8.7* 9.3  GFRNONAA >60 >60 >60   PROT 6.7 7.0 6.8  ALBUMIN 3.8 3.9 3.8  AST 36 28 37  ALT 63* 23 56*  ALKPHOS 105 140* 99  BILITOT 0.6 0.6 0.5    RADIOGRAPHIC STUDIES: I have personally reviewed the radiological images as listed and agreed with the findings in the report. No results found.

## 2023-01-08 ENCOUNTER — Inpatient Hospital Stay: Payer: BC Managed Care – PPO

## 2023-01-08 ENCOUNTER — Encounter: Payer: Self-pay | Admitting: Internal Medicine

## 2023-01-08 ENCOUNTER — Other Ambulatory Visit: Payer: Self-pay

## 2023-01-08 VITALS — BP 131/75 | HR 99 | Temp 96.3°F | Resp 18

## 2023-01-08 DIAGNOSIS — D6481 Anemia due to antineoplastic chemotherapy: Secondary | ICD-10-CM | POA: Diagnosis not present

## 2023-01-08 DIAGNOSIS — Z79899 Other long term (current) drug therapy: Secondary | ICD-10-CM | POA: Diagnosis not present

## 2023-01-08 DIAGNOSIS — C7801 Secondary malignant neoplasm of right lung: Secondary | ICD-10-CM | POA: Diagnosis not present

## 2023-01-08 DIAGNOSIS — T451X5A Adverse effect of antineoplastic and immunosuppressive drugs, initial encounter: Secondary | ICD-10-CM | POA: Diagnosis not present

## 2023-01-08 DIAGNOSIS — C221 Intrahepatic bile duct carcinoma: Secondary | ICD-10-CM | POA: Diagnosis not present

## 2023-01-08 DIAGNOSIS — Z5111 Encounter for antineoplastic chemotherapy: Secondary | ICD-10-CM | POA: Diagnosis not present

## 2023-01-08 DIAGNOSIS — I10 Essential (primary) hypertension: Secondary | ICD-10-CM | POA: Diagnosis not present

## 2023-01-08 MED ORDER — DEXAMETHASONE SODIUM PHOSPHATE 10 MG/ML IJ SOLN
10.0000 mg | Freq: Once | INTRAMUSCULAR | Status: AC
  Administered 2023-01-08: 10 mg via INTRAVENOUS
  Filled 2023-01-08: qty 1

## 2023-01-08 MED ORDER — POTASSIUM CHLORIDE IN NACL 20-0.9 MEQ/L-% IV SOLN
Freq: Once | INTRAVENOUS | Status: AC
  Filled 2023-01-08: qty 1000

## 2023-01-08 MED ORDER — HEPARIN SOD (PORK) LOCK FLUSH 100 UNIT/ML IV SOLN
500.0000 [IU] | Freq: Once | INTRAVENOUS | Status: AC | PRN
  Administered 2023-01-08: 500 [IU]
  Filled 2023-01-08: qty 5

## 2023-01-08 MED ORDER — SODIUM CHLORIDE 0.9 % IV SOLN
Freq: Once | INTRAVENOUS | Status: AC
  Filled 2023-01-08: qty 250

## 2023-01-08 MED ORDER — SODIUM CHLORIDE 0.9 % IV SOLN
1000.0000 mg/m2 | Freq: Once | INTRAVENOUS | Status: AC
  Administered 2023-01-08: 2318 mg via INTRAVENOUS
  Filled 2023-01-08: qty 60.96

## 2023-01-08 MED ORDER — SODIUM CHLORIDE 0.9 % IV SOLN
25.0000 mg/m2 | Freq: Once | INTRAVENOUS | Status: AC
  Administered 2023-01-08: 58 mg via INTRAVENOUS
  Filled 2023-01-08: qty 58

## 2023-01-08 MED ORDER — PALONOSETRON HCL INJECTION 0.25 MG/5ML
0.2500 mg | Freq: Once | INTRAVENOUS | Status: AC
  Administered 2023-01-08: 0.25 mg via INTRAVENOUS

## 2023-01-08 MED ORDER — MAGNESIUM SULFATE 2 GM/50ML IV SOLN
2.0000 g | Freq: Once | INTRAVENOUS | Status: AC
Start: 2023-01-08 — End: 2023-01-08
  Administered 2023-01-08: 2 g via INTRAVENOUS
  Filled 2023-01-08: qty 50

## 2023-01-08 MED ORDER — SODIUM CHLORIDE 0.9 % IV SOLN
150.0000 mg | Freq: Once | INTRAVENOUS | Status: AC
  Administered 2023-01-08: 150 mg via INTRAVENOUS
  Filled 2023-01-08: qty 150

## 2023-01-08 NOTE — Patient Instructions (Signed)
[facility] - A DEPT OF [facility]  Discharge Instructions: Thank you for choosing [facility] to provide your oncology and hematology care.  If you have a lab appointment with the Cancer Center, please go directly to the Cancer Center and check in at the registration area.  Wear comfortable clothing and clothing appropriate for easy access to any Portacath or PICC line.   We strive to give you quality time with your provider. You may need to reschedule your appointment if you arrive late (15 or more minutes).  Arriving late affects you and other patients whose appointments are after yours.  Also, if you miss three or more appointments without notifying the office, you may be dismissed from the clinic at the provider's discretion.      For prescription refill requests, have your pharmacy contact our office and allow 72 hours for refills to be completed.    Today you received the following chemotherapy and/or immunotherapy agents GEMZAR and CISPLATIN      To help prevent nausea and vomiting after your treatment, we encourage you to take your nausea medication as directed.  BELOW ARE SYMPTOMS THAT SHOULD BE REPORTED IMMEDIATELY: *FEVER GREATER THAN 100.4 F (38 C) OR HIGHER *CHILLS OR SWEATING *NAUSEA AND VOMITING THAT IS NOT CONTROLLED WITH YOUR NAUSEA MEDICATION *UNUSUAL SHORTNESS OF BREATH *UNUSUAL BRUISING OR BLEEDING *URINARY PROBLEMS (pain or burning when urinating, or frequent urination) *BOWEL PROBLEMS (unusual diarrhea, constipation, pain near the anus) TENDERNESS IN MOUTH AND THROAT WITH OR WITHOUT PRESENCE OF ULCERS (sore throat, sores in mouth, or a toothache) UNUSUAL RASH, SWELLING OR PAIN  UNUSUAL VAGINAL DISCHARGE OR ITCHING   Items with * indicate a potential emergency and should be followed up as soon as possible or go to the Emergency Department if any problems should occur.  Please show the CHEMOTHERAPY ALERT CARD or  IMMUNOTHERAPY ALERT CARD at check-in to the Emergency Department and triage nurse.  Should you have questions after your visit or need to cancel or reschedule your appointment, please contact [facility] - A DEPT OF [facility]  [phone] and follow the prompts.  Office hours are 8:00 a.m. to 4:30 p.m. Monday - Friday. Please note that voicemails left after 4:00 p.m. may not be returned until the following business day.  We are closed weekends and major holidays. You have access to a nurse at all times for urgent questions. Please call the main number to the clinic [phone] and follow the prompts.  For any non-urgent questions, you may also contact your provider using MyChart. We now offer e-Visits for anyone 70 and older to request care online for non-urgent symptoms. For details visit [email or web address].   Also download the MyChart app! Go to the app store, search "MyChart", open the app, select [facility], and log in with your MyChart username and password.  Gemcitabine Injection What is this medication? GEMCITABINE (jem SYE ta been) treats some types of cancer. It works by slowing down the growth of cancer cells. This medicine may be used for other purposes; ask your health care provider or pharmacist if you have questions. COMMON BRAND NAME(S): Gemzar, Infugem What should I tell my care team before I take this medication? They need to know if you have any of these conditions: Blood disorders Infection Kidney disease Liver disease Lung or breathing disease, such as asthma or COPD Recent or ongoing radiation therapy An unusual or allergic reaction to  gemcitabine, other medications, foods, dyes, or preservatives If you or your partner are pregnant or trying to get pregnant Breast-feeding How should I use this medication? This medication is injected into a vein. It is given by your care team in a hospital or clinic setting. Talk to your care  team about the use of this medication in children. Special care may be needed. Overdosage: If you think you have taken too much of this medicine contact a poison control center or emergency room at once. NOTE: This medicine is only for you. Do not share this medicine with others. What if I miss a dose? Keep appointments for follow-up doses. It is important not to miss your dose. Call your care team if you are unable to keep an appointment. What may interact with this medication? Interactions have not been studied. This list may not describe all possible interactions. Give your health care provider a list of all the medicines, herbs, non-prescription drugs, or dietary supplements you use. Also tell them if you smoke, drink alcohol, or use illegal drugs. Some items may interact with your medicine. What should I watch for while using this medication? Your condition will be monitored carefully while you are receiving this medication. This medication may make you feel generally unwell. This is not uncommon, as chemotherapy can affect healthy cells as well as cancer cells. Report any side effects. Continue your course of treatment even though you feel ill unless your care team tells you to stop. In some cases, you may be given additional medications to help with side effects. Follow all directions for their use. This medication may increase your risk of getting an infection. Call your care team for advice if you get a fever, chills, sore throat, or other symptoms of a cold or flu. Do not treat yourself. Try to avoid being around people who are sick. This medication may increase your risk to bruise or bleed. Call your care team if you notice any unusual bleeding. Be careful brushing or flossing your teeth or using a toothpick because you may get an infection or bleed more easily. If you have any dental work done, tell your dentist you are receiving this medication. Avoid taking medications that contain  aspirin, acetaminophen, ibuprofen, naproxen, or ketoprofen unless instructed by your care team. These medications may hide a fever. Talk to your care team if you or your partner wish to become pregnant or think you might be pregnant. This medication can cause serious birth defects if taken during pregnancy and for 6 months after the last dose. A negative pregnancy test is required before starting this medication. A reliable form of contraception is recommended while taking this medication and for 6 months after the last dose. Talk to your care team about effective forms of contraception. Do not father a child while taking this medication and for 3 months after the last dose. Use a condom while having sex during this time period. Do not breastfeed while taking this medication and for at least 1 week after the last dose. This medication may cause infertility. Talk to your care team if you are concerned about your fertility. What side effects may I notice from receiving this medication? Side effects that you should report to your care team as soon as possible: Allergic reactions--skin rash, itching, hives, swelling of the face, lips, tongue, or throat Capillary leak syndrome--stomach or muscle pain, unusual weakness or fatigue, feeling faint or lightheaded, decrease in the amount of urine, swelling of the ankles,  hands, or feet, trouble breathing Infection--fever, chills, cough, sore throat, wounds that don't heal, pain or trouble when passing urine, general feeling of discomfort or being unwell Liver injury--right upper belly pain, loss of appetite, nausea, light-colored stool, dark yellow or brown urine, yellowing skin or eyes, unusual weakness or fatigue Low red blood cell level--unusual weakness or fatigue, dizziness, headache, trouble breathing Lung injury--shortness of breath or trouble breathing, cough, spitting up blood, chest pain, fever Stomach pain, bloody diarrhea, pale skin, unusual weakness or  fatigue, decrease in the amount of urine, which may be signs of hemolytic uremic syndrome Sudden and severe headache, confusion, change in vision, seizures, which may be signs of posterior reversible encephalopathy syndrome (PRES) Unusual bruising or bleeding Side effects that usually do not require medical attention (report to your care team if they continue or are bothersome): Diarrhea Drowsiness Hair loss Nausea Pain, redness, or swelling with sores inside the mouth or throat Vomiting This list may not describe all possible side effects. Call your doctor for medical advice about side effects. You may report side effects to FDA at 1-800-FDA-1088. Where should I keep my medication? This medication is given in a hospital or clinic. It will not be stored at home. NOTE: This sheet is a summary. It may not cover all possible information. If you have questions about this medicine, talk to your doctor, pharmacist, or health care provider.  2024 Elsevier/Gold Standard (2021-06-17 00:00:00)  Cisplatin Injection What is this medication? CISPLATIN (SIS pla tin) treats some types of cancer. It works by slowing down the growth of cancer cells. This medicine may be used for other purposes; ask your health care provider or pharmacist if you have questions. COMMON BRAND NAME(S): Platinol, Platinol -AQ What should I tell my care team before I take this medication? They need to know if you have any of these conditions: Eye disease, vision problems Hearing problems Kidney disease Low blood counts, such as low white cells, platelets, or red blood cells Tingling of the fingers or toes, or other nerve disorder An unusual or allergic reaction to cisplatin, carboplatin, oxaliplatin, other medications, foods, dyes, or preservatives If you or your partner are pregnant or trying to get pregnant Breast-feeding How should I use this medication? This medication is injected into a vein. It is given by your care  team in a hospital or clinic setting. Talk to your care team about the use of this medication in children. Special care may be needed. Overdosage: If you think you have taken too much of this medicine contact a poison control center or emergency room at once. NOTE: This medicine is only for you. Do not share this medicine with others. What if I miss a dose? Keep appointments for follow-up doses. It is important not to miss your dose. Call your care team if you are unable to keep an appointment. What may interact with this medication? Do not take this medication with any of the following: Live virus vaccines This medication may also interact with the following: Certain antibiotics, such as amikacin, gentamicin, neomycin, polymyxin B, streptomycin, tobramycin, vancomycin Foscarnet This list may not describe all possible interactions. Give your health care provider a list of all the medicines, herbs, non-prescription drugs, or dietary supplements you use. Also tell them if you smoke, drink alcohol, or use illegal drugs. Some items may interact with your medicine. What should I watch for while using this medication? Your condition will be monitored carefully while you are receiving this medication. You may  need blood work done while taking this medication. This medication may make you feel generally unwell. This is not uncommon, as chemotherapy can affect healthy cells as well as cancer cells. Report any side effects. Continue your course of treatment even though you feel ill unless your care team tells you to stop. This medication may increase your risk of getting an infection. Call your care team for advice if you get a fever, chills, sore throat, or other symptoms of a cold or flu. Do not treat yourself. Try to avoid being around people who are sick. Avoid taking medications that contain aspirin, acetaminophen, ibuprofen, naproxen, or ketoprofen unless instructed by your care team. These medications  may hide a fever. This medication may increase your risk to bruise or bleed. Call your care team if you notice any unusual bleeding. Be careful brushing or flossing your teeth or using a toothpick because you may get an infection or bleed more easily. If you have any dental work done, tell your dentist you are receiving this medication. Drink fluids as directed while you are taking this medication. This will help protect your kidneys. Call your care team if you get diarrhea. Do not treat yourself. Talk to your care team if you or your partner wish to become pregnant or think you might be pregnant. This medication can cause serious birth defects if taken during pregnancy and for 14 months after the last dose. A negative pregnancy test is required before starting this medication. A reliable form of contraception is recommended while taking this medication and for 14 months after the last dose. Talk to your care team about effective forms of contraception. Do not father a child while taking this medication and for 11 months after the last dose. Use a condom during sex during this time period. Do not breast-feed while taking this medication. This medication may cause infertility. Talk to your care team if you are concerned about your fertility. What side effects may I notice from receiving this medication? Side effects that you should report to your care team as soon as possible: Allergic reactions--skin rash, itching, hives, swelling of the face, lips, tongue, or throat Eye pain, change in vision, vision loss Hearing loss, ringing in ears Infection--fever, chills, cough, sore throat, wounds that don't heal, pain or trouble when passing urine, general feeling of discomfort or being unwell Kidney injury--decrease in the amount of urine, swelling of the ankles, hands, or feet Low red blood cell level--unusual weakness or fatigue, dizziness, headache, trouble breathing Painful swelling, warmth, or redness  of the skin, blisters or sores at the infusion site Pain, tingling, or numbness in the hands or feet Unusual bruising or bleeding Side effects that usually do not require medical attention (report to your care team if they continue or are bothersome): Hair loss Nausea Vomiting This list may not describe all possible side effects. Call your doctor for medical advice about side effects. You may report side effects to FDA at 1-800-FDA-1088. Where should I keep my medication? This medication is given in a hospital or clinic. It will not be stored at home. NOTE: This sheet is a summary. It may not cover all possible information. If you have questions about this medicine, talk to your doctor, pharmacist, or health care provider.  2024 Elsevier/Gold Standard (2021-06-13 00:00:00)

## 2023-01-11 ENCOUNTER — Encounter: Payer: Self-pay | Admitting: Internal Medicine

## 2023-01-11 ENCOUNTER — Inpatient Hospital Stay: Payer: BC Managed Care – PPO

## 2023-01-11 DIAGNOSIS — Z5111 Encounter for antineoplastic chemotherapy: Secondary | ICD-10-CM | POA: Diagnosis not present

## 2023-01-11 DIAGNOSIS — C221 Intrahepatic bile duct carcinoma: Secondary | ICD-10-CM

## 2023-01-11 DIAGNOSIS — D6481 Anemia due to antineoplastic chemotherapy: Secondary | ICD-10-CM | POA: Diagnosis not present

## 2023-01-11 DIAGNOSIS — I10 Essential (primary) hypertension: Secondary | ICD-10-CM | POA: Diagnosis not present

## 2023-01-11 DIAGNOSIS — T451X5A Adverse effect of antineoplastic and immunosuppressive drugs, initial encounter: Secondary | ICD-10-CM | POA: Diagnosis not present

## 2023-01-11 DIAGNOSIS — Z79899 Other long term (current) drug therapy: Secondary | ICD-10-CM | POA: Diagnosis not present

## 2023-01-11 DIAGNOSIS — C7801 Secondary malignant neoplasm of right lung: Secondary | ICD-10-CM | POA: Diagnosis not present

## 2023-01-11 MED ORDER — PEGFILGRASTIM-JMDB 6 MG/0.6ML ~~LOC~~ SOSY
6.0000 mg | PREFILLED_SYRINGE | Freq: Once | SUBCUTANEOUS | Status: AC
  Administered 2023-01-11: 6 mg via SUBCUTANEOUS
  Filled 2023-01-11: qty 0.6

## 2023-01-25 ENCOUNTER — Encounter: Payer: Self-pay | Admitting: Internal Medicine

## 2023-01-27 ENCOUNTER — Other Ambulatory Visit: Payer: Self-pay

## 2023-01-27 ENCOUNTER — Emergency Department
Admission: EM | Admit: 2023-01-27 | Discharge: 2023-01-27 | Discharge status: Against Medical Advice | Payer: BC Managed Care – PPO | Attending: Emergency Medicine | Admitting: Emergency Medicine

## 2023-01-27 ENCOUNTER — Encounter: Payer: Self-pay | Admitting: Intensive Care

## 2023-01-27 ENCOUNTER — Encounter: Payer: Self-pay | Admitting: Internal Medicine

## 2023-01-27 DIAGNOSIS — N12 Tubulo-interstitial nephritis, not specified as acute or chronic: Secondary | ICD-10-CM | POA: Insufficient documentation

## 2023-01-27 DIAGNOSIS — R079 Chest pain, unspecified: Secondary | ICD-10-CM | POA: Diagnosis not present

## 2023-01-27 DIAGNOSIS — Z85118 Personal history of other malignant neoplasm of bronchus and lung: Secondary | ICD-10-CM | POA: Diagnosis not present

## 2023-01-27 DIAGNOSIS — R109 Unspecified abdominal pain: Secondary | ICD-10-CM | POA: Diagnosis not present

## 2023-01-27 DIAGNOSIS — R0789 Other chest pain: Secondary | ICD-10-CM | POA: Diagnosis not present

## 2023-01-27 HISTORY — DX: Malignant neoplasm of extrahepatic bile duct: C24.0

## 2023-01-27 LAB — BASIC METABOLIC PANEL
Anion gap: 9 (ref 5–15)
BUN: 18 mg/dL (ref 6–20)
CO2: 23 mmol/L (ref 22–32)
Calcium: 9.1 mg/dL (ref 8.9–10.3)
Chloride: 104 mmol/L (ref 98–111)
Creatinine, Ser: 1.11 mg/dL — ABNORMAL HIGH (ref 0.44–1.00)
GFR, Estimated: 58 mL/min — ABNORMAL LOW (ref >60)
Glucose, Bld: 106 mg/dL — ABNORMAL HIGH (ref 70–99)
Potassium: 3.8 mmol/L (ref 3.5–5.1)
Sodium: 136 mmol/L (ref 135–145)

## 2023-01-27 LAB — CBC
HCT: 28.5 % — ABNORMAL LOW (ref 36.0–46.0)
Hemoglobin: 9.3 g/dL — ABNORMAL LOW (ref 12.0–15.0)
MCH: 38.8 pg — ABNORMAL HIGH (ref 26.0–34.0)
MCHC: 32.6 g/dL (ref 30.0–36.0)
MCV: 118.8 fL — ABNORMAL HIGH (ref 80.0–100.0)
Platelets: 265 10*3/uL (ref 150–400)
RBC: 2.4 MIL/uL — ABNORMAL LOW (ref 3.87–5.11)
RDW: 20.8 % — ABNORMAL HIGH (ref 11.5–15.5)
WBC: 6.8 10*3/uL (ref 4.0–10.5)
nRBC: 0 % (ref 0.0–0.2)

## 2023-01-27 LAB — URINALYSIS, ROUTINE W REFLEX MICROSCOPIC
Bilirubin Urine: NEGATIVE
Glucose, UA: NEGATIVE mg/dL
Hgb urine dipstick: NEGATIVE
Ketones, ur: NEGATIVE mg/dL
Nitrite: NEGATIVE
Protein, ur: NEGATIVE mg/dL
Specific Gravity, Urine: 1.023 (ref 1.005–1.030)
pH: 6 (ref 5.0–8.0)

## 2023-01-27 MED ORDER — CEFDINIR 300 MG PO CAPS: 300.0000 mg | ORAL_CAPSULE | Freq: Two times a day (BID) | ORAL | 0 refills | Status: AC

## 2023-01-27 MED ORDER — CEFDINIR 300 MG PO CAPS
300.0000 mg | ORAL_CAPSULE | Freq: Once | ORAL | Status: AC
  Administered 2023-01-27: 300 mg via ORAL
  Filled 2023-01-27: qty 1

## 2023-01-27 NOTE — ED Notes (Signed)
Pt refused final set of vitals

## 2023-01-27 NOTE — ED Notes (Signed)
Pt told provider that she "couldn't stand waiting here any longer" and was preparing to leave. Provider informed her that she would be leaving AMA. Pt stated she understood and intended to leave anyway. Pt agreed to sign AMA form.

## 2023-01-27 NOTE — ED Provider Notes (Signed)
[facility] Provider Note    Event Date/Time   First MD Initiated Contact with Patient 01/27/23 1823     (approximate)   History   Chief Complaint Flank Pain   HPI  Vicki Phillips is a 57 y.o. 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  Patient reports that she has had about 1 week of increasing pain in the left upper quadrant of her abdomen as well as her left flank.  This been associated with some malaise and shortness of breath, but she denies any fevers, cough, or chest pain.  She does endorse some urinary frequency, but denies any dysuria.  She has not had any nausea, vomiting, or diarrhea.  She is currently receiving chemotherapy for cholangiocarcinoma, states that she will often feel similarly on her weeks off of chemo.     Physical Exam   Triage Vital Signs: ED Triage Vitals  Encounter Vitals Group     BP 01/27/23 1522 (!) 155/96     Systolic BP Percentile --      Diastolic BP Percentile --      Pulse Rate 01/27/23 1522 98     Resp 01/27/23 1522 17     Temp 01/27/23 1522 98.6 F (37 C)     Temp Source 01/27/23 1522 Oral     SpO2 01/27/23 1522 98 %     Weight 01/27/23 1532 225 lb (102.1 kg)     Height 01/27/23 1532 5\' 9"  (1.753 m)     Head Circumference --      Peak Flow --      Pain Score 01/27/23 1531 8     Pain Loc --      Pain Education --      Exclude from Growth Chart --     Most recent vital signs: Vitals:   01/27/23 1522  BP: (!) 155/96  Pulse: 98  Resp: 17  Temp: 98.6 F (37 C)  SpO2: 98%    Constitutional: Alert and oriented. Eyes: Conjunctivae are normal. Head: Atraumatic. Nose: No congestion/rhinnorhea. Mouth/Throat: Mucous membranes are moist.  Cardiovascular: Normal rate, regular rhythm. Grossly normal heart sounds.  2+ radial pulses bilaterally. Respiratory: Normal respiratory effort.  No retractions. Lungs  CTAB. Gastrointestinal: Soft and tender to palpation in the left upper quadrant with no rebound or guarding.  No CVA tenderness bilaterally. No distention. Musculoskeletal: No lower extremity tenderness nor edema.  Neurologic:  Normal speech and language. No gross focal neurologic deficits are appreciated.    ED Results / Procedures / Treatments   Labs (all labs ordered are listed, but only abnormal results are displayed) Labs Reviewed  URINALYSIS, ROUTINE W REFLEX MICROSCOPIC - Abnormal; Notable for the following components:      Result Value   Color, Urine YELLOW (*)    APPearance CLEAR (*)    Leukocytes,Ua TRACE (*)    Bacteria, UA RARE (*)    All other components within normal limits  BASIC METABOLIC PANEL - Abnormal; Notable for the following components:   Glucose, Bld 106 (*)    Creatinine, Ser 1.11 (*)    GFR, Estimated 58 (*)    All other components within normal limits  CBC - Abnormal; Notable for the following components:   RBC 2.40 (*)    Hemoglobin 9.3 (*)    HCT 28.5 (*)    MCV 118.8 (*)    MCH 38.8 (*)  RDW 20.8 (*)    All other components within normal limits  URINE CULTURE    PROCEDURES:  Critical Care performed: No  Procedures   MEDICATIONS ORDERED IN ED: Medications  cefdinir (OMNICEF) capsule 300 mg (300 mg Oral Given 01/27/23 1932)     IMPRESSION / MDM / ASSESSMENT AND PLAN / ED COURSE  I reviewed the triage vital signs and the nursing notes.                              57 y.o. female with past medical history of cholangiocarcinoma and sarcoidosis who presents to the ED complaining of left flank pain with some shortness of breath and malaise worsening over the past week.  Patient's presentation is most consistent with acute presentation with potential threat to life or bodily function.  Differential diagnosis includes, but is not limited to, pyelonephritis, gastritis, pneumonia, PE, ACS, anemia, electrolyte abnormality, AKI.  Patient  nontoxic-appearing and in no acute distress, vital signs are unremarkable.  Labs thus far with improved anemia compared to previous, no significant leukocytosis or electrolyte abnormality noted, she does have a mild AKI.  Urinalysis appears concerning for possible infection and we will send for culture, would consider pyelonephritis given her urinary frequency.  Given her flank pain and shortness of breath, I advised patient that I would like to proceed with CTA of her chest to rule out PE as well as CT of abdomen/pelvis to assess for kidney stone.  Patient stated that "I would rather die than stay here any longer."  She is unwilling to stay for EKG or troponin to assess for ACS, declines pain medication.  We will start her on antibiotics for possible pyelonephritis and she was counseled to return to the ED at any time for reassessment.  She was advised of the risks of leaving at this time, despite this she wishes to sign out AGAINST MEDICAL ADVICE.  She was counseled to follow-up with her PCP or oncologist and to return to the ED at any time.  Patient agrees with plan.      FINAL CLINICAL IMPRESSION(S) / ED DIAGNOSES   Final diagnoses:  Flank pain  Pyelonephritis  Chest pain, unspecified type     Rx / DC Orders   ED Discharge Orders          Ordered    cefdinir (OMNICEF) 300 MG capsule  2 times daily        01/27/23 1900             Note:  This document was prepared using Dragon voice recognition software and may include unintentional dictation errors.   Chesley Noon, MD 01/27/23 2006

## 2023-01-27 NOTE — ED Triage Notes (Addendum)
Patient c/o left flank pain that started around 2pm. Reports frequent urination the past week. Denies fever  Last chemo November 15th. 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

## 2023-01-28 ENCOUNTER — Inpatient Hospital Stay: Payer: BC Managed Care – PPO | Attending: Internal Medicine

## 2023-01-28 ENCOUNTER — Inpatient Hospital Stay (HOSPITAL_BASED_OUTPATIENT_CLINIC_OR_DEPARTMENT_OTHER): Payer: BC Managed Care – PPO | Admitting: Internal Medicine

## 2023-01-28 VITALS — BP 129/90 | HR 93 | Temp 97.2°F | Resp 16 | Wt 234.8 lb

## 2023-01-28 DIAGNOSIS — Z5111 Encounter for antineoplastic chemotherapy: Secondary | ICD-10-CM | POA: Diagnosis not present

## 2023-01-28 DIAGNOSIS — Z79899 Other long term (current) drug therapy: Secondary | ICD-10-CM | POA: Diagnosis not present

## 2023-01-28 DIAGNOSIS — Z5112 Encounter for antineoplastic immunotherapy: Secondary | ICD-10-CM

## 2023-01-28 DIAGNOSIS — C221 Intrahepatic bile duct carcinoma: Secondary | ICD-10-CM | POA: Diagnosis not present

## 2023-01-28 DIAGNOSIS — C7801 Secondary malignant neoplasm of right lung: Secondary | ICD-10-CM | POA: Diagnosis not present

## 2023-01-28 DIAGNOSIS — D6481 Anemia due to antineoplastic chemotherapy: Secondary | ICD-10-CM | POA: Diagnosis not present

## 2023-01-28 DIAGNOSIS — Z95828 Presence of other vascular implants and grafts: Secondary | ICD-10-CM

## 2023-01-28 DIAGNOSIS — C7802 Secondary malignant neoplasm of left lung: Secondary | ICD-10-CM

## 2023-01-28 DIAGNOSIS — C7800 Secondary malignant neoplasm of unspecified lung: Secondary | ICD-10-CM | POA: Diagnosis not present

## 2023-01-28 DIAGNOSIS — T451X5A Adverse effect of antineoplastic and immunosuppressive drugs, initial encounter: Secondary | ICD-10-CM

## 2023-01-28 DIAGNOSIS — R109 Unspecified abdominal pain: Secondary | ICD-10-CM | POA: Insufficient documentation

## 2023-01-28 LAB — CBC WITH DIFFERENTIAL (CANCER CENTER ONLY)
Abs Immature Granulocytes: 0.02 10*3/uL (ref 0.00–0.07)
Basophils Absolute: 0 10*3/uL (ref 0.0–0.1)
Basophils Relative: 0 %
Eosinophils Absolute: 0.1 10*3/uL (ref 0.0–0.5)
Eosinophils Relative: 2 %
HCT: 29.5 % — ABNORMAL LOW (ref 36.0–46.0)
Hemoglobin: 9.6 g/dL — ABNORMAL LOW (ref 12.0–15.0)
Immature Granulocytes: 0 %
Lymphocytes Relative: 10 %
Lymphs Abs: 0.6 10*3/uL — ABNORMAL LOW (ref 0.7–4.0)
MCH: 38.4 pg — ABNORMAL HIGH (ref 26.0–34.0)
MCHC: 32.5 g/dL (ref 30.0–36.0)
MCV: 118 fL — ABNORMAL HIGH (ref 80.0–100.0)
Monocytes Absolute: 0.5 10*3/uL (ref 0.1–1.0)
Monocytes Relative: 8 %
Neutro Abs: 4.5 10*3/uL (ref 1.7–7.7)
Neutrophils Relative %: 80 %
Platelet Count: 242 10*3/uL (ref 150–400)
RBC: 2.5 MIL/uL — ABNORMAL LOW (ref 3.87–5.11)
RDW: 20.8 % — ABNORMAL HIGH (ref 11.5–15.5)
WBC Count: 5.7 10*3/uL (ref 4.0–10.5)
nRBC: 0 % (ref 0.0–0.2)

## 2023-01-28 LAB — MAGNESIUM: Magnesium: 1.9 mg/dL (ref 1.7–2.4)

## 2023-01-28 LAB — CMP (CANCER CENTER ONLY)
ALT: 28 U/L (ref 0–44)
AST: 35 U/L (ref 15–41)
Albumin: 4.4 g/dL (ref 3.5–5.0)
Alkaline Phosphatase: 124 U/L (ref 38–126)
Anion gap: 9 (ref 5–15)
BUN: 11 mg/dL (ref 6–20)
CO2: 24 mmol/L (ref 22–32)
Calcium: 9.2 mg/dL (ref 8.9–10.3)
Chloride: 104 mmol/L (ref 98–111)
Creatinine: 0.6 mg/dL (ref 0.44–1.00)
GFR, Estimated: >60 mL/min (ref >60)
Glucose, Bld: 106 mg/dL — ABNORMAL HIGH (ref 70–99)
Potassium: 3.7 mmol/L (ref 3.5–5.1)
Sodium: 137 mmol/L (ref 135–145)
Total Bilirubin: 0.5 mg/dL (ref <1.2)
Total Protein: 7.8 g/dL (ref 6.5–8.1)

## 2023-01-28 MED ORDER — ALTEPLASE 2 MG IJ SOLR
2.0000 mg | Freq: Once | INTRAMUSCULAR | Status: AC
  Administered 2023-01-28: 2 mg
  Filled 2023-01-28: qty 2

## 2023-01-28 MED ORDER — SODIUM CHLORIDE 0.9% FLUSH
10.0000 mL | Freq: Once | INTRAVENOUS | Status: AC
  Administered 2023-01-28: 10 mL via INTRAVENOUS
  Filled 2023-01-28: qty 10

## 2023-01-28 MED ORDER — HEPARIN SOD (PORK) LOCK FLUSH 100 UNIT/ML IV SOLN
500.0000 [IU] | Freq: Once | INTRAVENOUS | Status: AC
  Administered 2023-01-28: 500 [IU] via INTRAVENOUS
  Filled 2023-01-28: qty 5

## 2023-01-28 MED FILL — Fosaprepitant Dimeglumine For IV Infusion 150 MG (Base Eq): INTRAVENOUS | Qty: 5 | Status: AC

## 2023-01-28 NOTE — Progress Notes (Signed)
[facility] CONSULT NOTE  Patient Care Team: Reubin Milan, MD as PCP - General (Internal Medicine) Glory Buff, RN as Oncology Nurse Navigator Michaelyn Barter, MD as Consulting Physician (Oncology)   CANCER STAGING   Cancer Staging  Cholangiocarcinoma [facility]) Staging form: Intrahepatic Bile Duct, AJCC 8th Edition - Clinical stage from 08/26/2022: Stage IV (cM1) - Signed by Michaelyn Barter, MD on 09/03/2022 Stage prefix: Initial diagnosis   ASSESSMENT & PLAN:  Vicki Phillips 57 y.o. 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  # Intrahepatic Cholangiocarcinoma, stage IV - s/p liver biopsy on 08/26/2022 showed metastatic moderately differentiated adenocarcinoma.  IHC stain positive for CK7 and CK20.  TTF-1, PAX8, GATA3 and CDX2 are negative.  Differentials include cholangiocarcinoma and pancreatic carcinoma.   Plan- -patient sought second opinion with Dr. Hulen Luster at [facility] who agreed with the treatment plan.  Continue with the current plan.  Has follow-up scheduled in January 2025.  -CT imaging after 4 cycles of treatment showed stable to mildly improved disease.   -PD-L1 0%.  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 -Pretreatment CA 19-9 352 -> 130 ->164 ->122.  Will continue to trend.  -Labs reviewed and acceptable for treatment.  Hemoglobin has improved to 9.6.  She will come tomorrow for cycle 7-day 1 of cisplatin, gemcitabine and Durvalumab.  Her left flank pain has significantly improved after she passed the kidney stone.  My suspicion for UTI is low.  UA showing only trace LE and rare bacteria.  Does not have any dysuria.  Reports frequency but has been drinking more.  Urine culture is pending.  I will go ahead with the treatment tomorrow as planned.  Will start on antibiotics based on the urine culture.  - Will schedule her for repeat scan after 8 cycles.  She is  scheduled to follow-up at [facility] on January 7.  # Chemotherapy related  anemia -Iron panel consistent with anemia of chronic disease -Hemoglobin 7.6.  Continue with Retacrit 40,000 unit every 2-week (started on 12/18/2022) -s/p 1 U PRBC on 01/05/23.  # Shortness of breath -Stable.  Continue with albuterol inhaler  # Right knee pain -MRI right knee showing horizontal tear in the posterior horn median meniscus and moderate-sized Baker's cyst with suspected leak or rupture, tricompartmental osteoarthritis.   -Seen by Raechel Chute.  Had cortisone shot with mild improvement.  She will follow-up with them in 4 months.  # Hypertension -Continue with amlodipine 5 mg daily.   # Cough -Continue with Tussionex as needed.  # Poor appetite -Discussed about nutrition consult and appetite stimulant.  Would like to hold off  # History of sarcoidosis -Management per pulmonary -Never required treatment.  Orders Placed This Encounter  Procedures   CT CHEST ABDOMEN PELVIS W CONTRAST    Standing Status:   Future    Standing Expiration Date:   01/28/2024    Order Specific Question:   If indicated for the ordered procedure, I authorize the administration of contrast media per Radiology protocol    Answer:   Yes    Order Specific Question:   Does the patient have a contrast media/X-ray dye allergy?    Answer:   No    Order Specific Question:   Is patient pregnant?    Answer:   No    Order Specific Question:   Preferred imaging location?    Answer:   [facility]    Order Specific Question:  If indicated for the ordered procedure, I authorize the administration of oral contrast media per Radiology protocol    Answer:   Yes   CBC with Differential (Cancer Center Only)    Standing Status:   Future    Standing Expiration Date:   02/18/2024   CMP (Cancer Center only)    Standing Status:   Future    Standing Expiration Date:   02/18/2024   Magnesium    Standing Status:   Future    Standing  Expiration Date:   02/18/2024   CBC with Differential (Cancer Center Only)    Standing Status:   Future    Standing Expiration Date:   02/25/2024   CMP (Cancer Center only)    Standing Status:   Future    Standing Expiration Date:   02/25/2024   Magnesium    Standing Status:   Future    Standing Expiration Date:   02/25/2024   RTC in 1 week for md visit, labs , chemo  The total time spent in the appointment was 30 minutes encounter with patients including review of chart and various tests results, discussions about plan of care and coordination of care plan   All questions were answered. The patient knows to call the clinic with any problems, questions or concerns. No barriers to learning was detected.  Michaelyn Barter, MD 12/5/202412:25 PM   HISTORY OF PRESENTING ILLNESS:  Vicki Phillips 57 y.o. 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  Patient reports she presented to ED with complaints of chest pain.  Had chest x-ray which showed pulmonary nodules was referred to pulmonary.  Records not available.  Was then seen by Dr. Aundria Rud on 08/05/2022.  CT chest showed innumerable pulmonary nodules of varying sizes throughout the lung.  Largest spiculated appearing nodule of anterior right upper lobe measuring 2 x 2 cm, at least 1 ill-defined hypodense lesion of the anterior left lobe of the liver.  Despite stated history of sarcoidosis, there are no specific or characteristic features of sarcoidosis with respect to distribution, morphology, associated fibrotic change, nor associated mediastinal lymphadenopathy. 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  She has history of sarcoidosis but was never on treatment.  Reports that she does not see doctors on a  regular basis because she is had bad experiences.  Interval history Patient seen today as follow-up prior to cycle 7-day 1 of gemcitabine, cisplatin and durvalumab.  She went to emergency room yesterday because of severe left flank pain.  Declined CT imaging there.  Then she passed a kidney stone and now pain is significantly improved.  She has some dull sensation on the left side.  She was prescribed 10 days of antibiotics due to concern for UTI.  Reports on and off shortness of breath which is ongoing since October.  Uses albuterol inhaler which helps.  This week is little worse.  She does not want any CT scans right now.  Cough is about the same.  I have reviewed her chart and materials related to her cancer extensively and collaborated history with the patient. Summary of oncologic history is as follows: Oncology History  Cholangiocarcinoma (HCC)  08/26/2022 Cancer Staging   Staging form: Intrahepatic Bile Duct, AJCC 8th Edition - Clinical stage from 08/26/2022: Stage IV (cM1) - Signed by  Michaelyn Barter, MD on 09/03/2022 Stage prefix: Initial diagnosis   09/03/2022 Initial Diagnosis   Cholangiocarcinoma (HCC)   09/18/2022 -  Chemotherapy   Patient is on Treatment Plan : BILIARY TRACT Cisplatin + Gemcitabine D1,8 + Durvalumab (1500) D1 q21d / Durvalumab (1500) q28d     Metastasis to lung (HCC)  09/03/2022 Initial Diagnosis   Metastasis to lung (HCC)   09/18/2022 -  Chemotherapy   Patient is on Treatment Plan : BILIARY TRACT Cisplatin + Gemcitabine D1,8 + Durvalumab (1500) D1 q21d / Durvalumab (1500) q28d       MEDICAL HISTORY:  Past Medical History:  Diagnosis Date   Allergy    Bile duct cancer (HCC)    Sarcoidosis 2011    SURGICAL HISTORY: Past Surgical History:  Procedure Laterality Date   CESAREAN SECTION     x 2   COLONOSCOPY WITH PROPOFOL N/A 08/10/2019   Procedure: COLONOSCOPY WITH PROPOFOL;  Surgeon: Midge Minium, MD;  Location: [facility];  Service: Endoscopy;   Laterality: N/A;  priority 4   IR IMAGING GUIDED PORT INSERTION  09/11/2022   LUNG BIOPSY      SOCIAL HISTORY: Social History   Socioeconomic History   Marital status: Single    Spouse name: Not on file   Number of children: 2   Years of education: Not on file   Highest education level: Not on file  Occupational History   Not on file  Tobacco Use   Smoking status: Never   Smokeless tobacco: Never  Vaping Use   Vaping status: Never Used  Substance and Sexual Activity   Alcohol use: Not Currently    Alcohol/week: 4.0 standard drinks of alcohol    Types: 4 Glasses of wine per week   Drug use: No   Sexual activity: Yes    Partners: Male    Birth control/protection: Surgical  Other Topics Concern   Not on file  Social History Narrative   Not on file   Social Determinants of Health   Financial Resource Strain: Low Risk  (09/18/2022)   Overall Financial Resource Strain (CARDIA)    Difficulty of Paying Living Expenses: Not hard at all  Food Insecurity: No Food Insecurity (08/24/2022)   Hunger Vital Sign    Worried About Running Out of Food in the Last Year: Never true    Ran Out of Food in the Last Year: Never true  Transportation Needs: No Transportation Needs (08/24/2022)   PRAPARE - Administrator, Civil Service (Medical): No    Lack of Transportation (Non-Medical): No  Physical Activity: Not on file  Stress: Not on file  Social Connections: Not on file  Intimate Partner Violence: Not At Risk (08/24/2022)   Humiliation, Afraid, Rape, and Kick questionnaire    Fear of Current or Ex-Partner: No    Emotionally Abused: No    Physically Abused: No    Sexually Abused: No    FAMILY HISTORY: Family History  Problem Relation Age of Onset   Stroke Mother    Heart disease Mother    Heart attack Father 28   Heart disease Father    Kidney disease Sister     ALLERGIES:  is allergic to other.  MEDICATIONS:  Current Outpatient Medications  Medication Sig Dispense  Refill   albuterol (VENTOLIN HFA) 108 (90 Base) MCG/ACT inhaler Inhale 2 puffs into the lungs every 6 (six) hours as needed for wheezing or shortness of breath. 8 g 0   amLODipine (NORVASC) 5  MG tablet Take 1 tablet (5 mg total) by mouth daily. 90 tablet 0   cefdinir (OMNICEF) 300 MG capsule Take 1 capsule (300 mg total) by mouth 2 (two) times daily for 10 days. 20 capsule 0   chlorpheniramine-HYDROcodone (TUSSIONEX) 10-8 MG/5ML Take 5 mLs by mouth at bedtime as needed for cough. 140 mL 0   dexamethasone (DECADRON) 4 MG tablet Take 2 tablets (8 mg) by mouth daily x 3 days starting the day after cisplatin chemotherapy. Take with food. 30 tablet 1   loratadine (CLARITIN) 10 MG tablet Take 10 mg by mouth daily.     ondansetron (ZOFRAN) 8 MG tablet Take 1 tablet (8 mg total) by mouth every 8 (eight) hours as needed for nausea or vomiting. Start on the third day after cisplatin. (Patient not taking: Reported on 11/26/2022) 30 tablet 1   prochlorperazine (COMPAZINE) 10 MG tablet Take 1 tablet (10 mg total) by mouth every 6 (six) hours as needed (Nausea or vomiting). (Patient not taking: Reported on 11/05/2022) 30 tablet 1   pseudoephedrine-acetaminophen (TYLENOL SINUS) 30-500 MG TABS tablet Take 1 tablet by mouth in the morning and at bedtime.     No current facility-administered medications for this visit.    REVIEW OF SYSTEMS:   Pertinent information mentioned in HPI All other systems were reviewed with the patient and are negative.  PHYSICAL EXAMINATION: ECOG PERFORMANCE STATUS: 1 - Symptomatic but completely ambulatory  Vitals:   01/28/23 1010  BP: (!) 129/90  Pulse: 93  Resp: 16  Temp: (!) 97.2 F (36.2 C)  SpO2: 100%     Filed Weights   01/28/23 1010  Weight: 234 lb 12.8 oz (106.5 kg)      GENERAL:alert, no distress and comfortable SKIN: skin color, texture, turgor are normal, no rashes or significant lesions EYES: normal, conjunctiva are pink and non-injected, sclera  clear OROPHARYNX:no exudate, no erythema and lips, buccal mucosa, and tongue normal  NECK: supple, thyroid normal size, non-tender, without nodularity LYMPH:  no palpable lymphadenopathy in the cervical, axillary or inguinal LUNGS: clear to auscultation and percussion with normal breathing effort HEART: regular rate & rhythm and no murmurs and no lower extremity edema ABDOMEN:abdomen soft, non-tender and normal bowel sounds Musculoskeletal:no cyanosis of digits and no clubbing  PSYCH: alert & oriented x 3 with fluent speech NEURO: no focal motor/sensory deficits  LABORATORY DATA:  I have reviewed the data as listed Lab Results  Component Value Date   WBC 5.7 01/28/2023   HGB 9.6 (L) 01/28/2023   HCT 29.5 (L) 01/28/2023   MCV 118.0 (H) 01/28/2023   PLT 242 01/28/2023   Recent Labs    12/31/22 0925 01/07/23 0935 01/27/23 1524 01/28/23 0928  NA 137 135 136 137  K 3.7 4.1 3.8 3.7  CL 106 102 104 104  CO2 22 25 23 24   GLUCOSE 104* 102* 106* 106*  BUN 16 17 18 11   CREATININE 0.79 0.57 1.11* 0.60  CALCIUM 8.7* 9.3 9.1 9.2  GFRNONAA >60 >60 58* >60  PROT 7.0 6.8  --  7.8  ALBUMIN 3.9 3.8  --  4.4  AST 28 37  --  35  ALT 23 56*  --  28  ALKPHOS 140* 99  --  124  BILITOT 0.6 0.5  --  0.5    RADIOGRAPHIC STUDIES: I have personally reviewed the radiological images as listed and agreed with the findings in the report. No results found.

## 2023-01-28 NOTE — Progress Notes (Signed)
Patient went to ER last night due to having severe shooting pain in in her left side, which they gave her antibiotics and told her she probably had a kidney stone, which she did because she passed the kidney stone last night and feels much better.

## 2023-01-29 ENCOUNTER — Other Ambulatory Visit: Payer: Self-pay

## 2023-01-29 ENCOUNTER — Inpatient Hospital Stay: Payer: BC Managed Care – PPO

## 2023-01-29 VITALS — BP 124/89 | HR 102 | Temp 97.5°F | Resp 16

## 2023-01-29 DIAGNOSIS — Z79899 Other long term (current) drug therapy: Secondary | ICD-10-CM | POA: Diagnosis not present

## 2023-01-29 DIAGNOSIS — C7800 Secondary malignant neoplasm of unspecified lung: Secondary | ICD-10-CM | POA: Diagnosis not present

## 2023-01-29 DIAGNOSIS — T451X5A Adverse effect of antineoplastic and immunosuppressive drugs, initial encounter: Secondary | ICD-10-CM

## 2023-01-29 DIAGNOSIS — Z5111 Encounter for antineoplastic chemotherapy: Secondary | ICD-10-CM | POA: Diagnosis not present

## 2023-01-29 DIAGNOSIS — C7801 Secondary malignant neoplasm of right lung: Secondary | ICD-10-CM

## 2023-01-29 DIAGNOSIS — D6481 Anemia due to antineoplastic chemotherapy: Secondary | ICD-10-CM | POA: Diagnosis not present

## 2023-01-29 DIAGNOSIS — C221 Intrahepatic bile duct carcinoma: Secondary | ICD-10-CM

## 2023-01-29 LAB — URINE CULTURE

## 2023-01-29 LAB — CANCER ANTIGEN 19-9: CA 19-9: 159 U/mL — ABNORMAL HIGH (ref 0–35)

## 2023-01-29 MED ORDER — MAGNESIUM SULFATE 2 GM/50ML IV SOLN
2.0000 g | Freq: Once | INTRAVENOUS | Status: AC
  Administered 2023-01-29: 2 g via INTRAVENOUS
  Filled 2023-01-29: qty 50

## 2023-01-29 MED ORDER — DEXAMETHASONE SODIUM PHOSPHATE 10 MG/ML IJ SOLN
10.0000 mg | Freq: Once | INTRAMUSCULAR | Status: AC
  Administered 2023-01-29: 10 mg via INTRAVENOUS
  Filled 2023-01-29: qty 1

## 2023-01-29 MED ORDER — SODIUM CHLORIDE 0.9 % IV SOLN
25.0000 mg/m2 | Freq: Once | INTRAVENOUS | Status: AC
  Administered 2023-01-29: 58 mg via INTRAVENOUS
  Filled 2023-01-29: qty 58

## 2023-01-29 MED ORDER — SODIUM CHLORIDE 0.9 % IV SOLN
Freq: Once | INTRAVENOUS | Status: AC
  Filled 2023-01-29: qty 250

## 2023-01-29 MED ORDER — SODIUM CHLORIDE 0.9 % IV SOLN
Freq: Once | INTRAVENOUS | Status: DC
Start: 2023-01-29 — End: 2023-01-29
  Filled 2023-01-29: qty 250

## 2023-01-29 MED ORDER — POTASSIUM CHLORIDE IN NACL 20-0.9 MEQ/L-% IV SOLN
Freq: Once | INTRAVENOUS | Status: AC
  Filled 2023-01-29: qty 1000

## 2023-01-29 MED ORDER — PALONOSETRON HCL INJECTION 0.25 MG/5ML
0.2500 mg | Freq: Once | INTRAVENOUS | Status: AC
  Administered 2023-01-29: 0.25 mg via INTRAVENOUS
  Filled 2023-01-29: qty 5

## 2023-01-29 MED ORDER — FOSAPREPITANT DIMEGLUMINE INJECTION 150 MG
150.0000 mg | Freq: Once | INTRAVENOUS | Status: AC
  Administered 2023-01-29: 150 mg via INTRAVENOUS
  Filled 2023-01-29: qty 150

## 2023-01-29 MED ORDER — HEPARIN SOD (PORK) LOCK FLUSH 100 UNIT/ML IV SOLN
500.0000 [IU] | Freq: Once | INTRAVENOUS | Status: AC | PRN
  Administered 2023-01-29: 500 [IU]
  Filled 2023-01-29: qty 5

## 2023-01-29 MED ORDER — SODIUM CHLORIDE 0.9 % IV SOLN
1000.0000 mg/m2 | Freq: Once | INTRAVENOUS | Status: AC
  Administered 2023-01-29: 2318 mg via INTRAVENOUS
  Filled 2023-01-29: qty 60.96

## 2023-01-29 MED ORDER — EPOETIN ALFA-EPBX 40000 UNIT/ML IJ SOLN
40000.0000 [IU] | Freq: Once | INTRAMUSCULAR | Status: AC
  Administered 2023-01-29: 40000 [IU] via SUBCUTANEOUS
  Filled 2023-01-29: qty 1

## 2023-01-29 MED ORDER — SODIUM CHLORIDE 0.9 % IV SOLN
1500.0000 mg | Freq: Once | INTRAVENOUS | Status: AC
  Administered 2023-01-29: 1500 mg via INTRAVENOUS
  Filled 2023-01-29: qty 30

## 2023-01-29 NOTE — Progress Notes (Signed)
Ok to run hydration fluid with cisplatin per Dr Alena Bills.

## 2023-01-29 NOTE — Patient Instructions (Signed)
[facility] - A DEPT OF [facility]  Discharge Instructions: Thank you for choosing [facility] to provide your oncology and hematology care.  If you have a lab appointment with the Cancer Center, please go directly to the Cancer Center and check in at the registration area.  Wear comfortable clothing and clothing appropriate for easy access to any Portacath or PICC line.   We strive to give you quality time with your provider. You may need to reschedule your appointment if you arrive late (15 or more minutes).  Arriving late affects you and other patients whose appointments are after yours.  Also, if you miss three or more appointments without notifying the office, you may be dismissed from the clinic at the provider's discretion.      For prescription refill requests, have your pharmacy contact our office and allow 72 hours for refills to be completed.    Today you received the following chemotherapy and/or immunotherapy agents GEMZAR, IMFINZI, and CISPLATIN     To help prevent nausea and vomiting after your treatment, we encourage you to take your nausea medication as directed.  BELOW ARE SYMPTOMS THAT SHOULD BE REPORTED IMMEDIATELY: *FEVER GREATER THAN 100.4 F (38 C) OR HIGHER *CHILLS OR SWEATING *NAUSEA AND VOMITING THAT IS NOT CONTROLLED WITH YOUR NAUSEA MEDICATION *UNUSUAL SHORTNESS OF BREATH *UNUSUAL BRUISING OR BLEEDING *URINARY PROBLEMS (pain or burning when urinating, or frequent urination) *BOWEL PROBLEMS (unusual diarrhea, constipation, pain near the anus) TENDERNESS IN MOUTH AND THROAT WITH OR WITHOUT PRESENCE OF ULCERS (sore throat, sores in mouth, or a toothache) UNUSUAL RASH, SWELLING OR PAIN  UNUSUAL VAGINAL DISCHARGE OR ITCHING   Items with * indicate a potential emergency and should be followed up as soon as possible or go to the Emergency Department if any problems should occur.  Please show the CHEMOTHERAPY ALERT CARD  or IMMUNOTHERAPY ALERT CARD at check-in to the Emergency Department and triage nurse.  Should you have questions after your visit or need to cancel or reschedule your appointment, please contact [facility] - A DEPT OF [facility]  [phone] and follow the prompts.  Office hours are 8:00 a.m. to 4:30 p.m. Monday - Friday. Please note that voicemails left after 4:00 p.m. may not be returned until the following business day.  We are closed weekends and major holidays. You have access to a nurse at all times for urgent questions. Please call the main number to the clinic [phone] and follow the prompts.  For any non-urgent questions, you may also contact your provider using MyChart. We now offer e-Visits for anyone 87 and older to request care online for non-urgent symptoms. For details visit [email or web address].   Also download the MyChart app! Go to the app store, search "MyChart", open the app, select [facility], and log in with your MyChart username and password.  Gemcitabine Injection What is this medication? GEMCITABINE (jem SYE ta been) treats some types of cancer. It works by slowing down the growth of cancer cells. This medicine may be used for other purposes; ask your health care provider or pharmacist if you have questions. COMMON BRAND NAME(S): Gemzar, Infugem What should I tell my care team before I take this medication? They need to know if you have any of these conditions: Blood disorders Infection Kidney disease Liver disease Lung or breathing disease, such as asthma or COPD Recent or ongoing radiation therapy An unusual  or allergic reaction to gemcitabine, other medications, foods, dyes, or preservatives If you or your partner are pregnant or trying to get pregnant Breast-feeding How should I use this medication? This medication is injected into a vein. It is given by your care team in a hospital or clinic setting. Talk to your  care team about the use of this medication in children. Special care may be needed. Overdosage: If you think you have taken too much of this medicine contact a poison control center or emergency room at once. NOTE: This medicine is only for you. Do not share this medicine with others. What if I miss a dose? Keep appointments for follow-up doses. It is important not to miss your dose. Call your care team if you are unable to keep an appointment. What may interact with this medication? Interactions have not been studied. This list may not describe all possible interactions. Give your health care provider a list of all the medicines, herbs, non-prescription drugs, or dietary supplements you use. Also tell them if you smoke, drink alcohol, or use illegal drugs. Some items may interact with your medicine. What should I watch for while using this medication? Your condition will be monitored carefully while you are receiving this medication. This medication may make you feel generally unwell. This is not uncommon, as chemotherapy can affect healthy cells as well as cancer cells. Report any side effects. Continue your course of treatment even though you feel ill unless your care team tells you to stop. In some cases, you may be given additional medications to help with side effects. Follow all directions for their use. This medication may increase your risk of getting an infection. Call your care team for advice if you get a fever, chills, sore throat, or other symptoms of a cold or flu. Do not treat yourself. Try to avoid being around people who are sick. This medication may increase your risk to bruise or bleed. Call your care team if you notice any unusual bleeding. Be careful brushing or flossing your teeth or using a toothpick because you may get an infection or bleed more easily. If you have any dental work done, tell your dentist you are receiving this medication. Avoid taking medications that contain  aspirin, acetaminophen, ibuprofen, naproxen, or ketoprofen unless instructed by your care team. These medications may hide a fever. Talk to your care team if you or your partner wish to become pregnant or think you might be pregnant. This medication can cause serious birth defects if taken during pregnancy and for 6 months after the last dose. A negative pregnancy test is required before starting this medication. A reliable form of contraception is recommended while taking this medication and for 6 months after the last dose. Talk to your care team about effective forms of contraception. Do not father a child while taking this medication and for 3 months after the last dose. Use a condom while having sex during this time period. Do not breastfeed while taking this medication and for at least 1 week after the last dose. This medication may cause infertility. Talk to your care team if you are concerned about your fertility. What side effects may I notice from receiving this medication? Side effects that you should report to your care team as soon as possible: Allergic reactions--skin rash, itching, hives, swelling of the face, lips, tongue, or throat Capillary leak syndrome--stomach or muscle pain, unusual weakness or fatigue, feeling faint or lightheaded, decrease in the amount of urine,  swelling of the ankles, hands, or feet, trouble breathing Infection--fever, chills, cough, sore throat, wounds that don't heal, pain or trouble when passing urine, general feeling of discomfort or being unwell Liver injury--right upper belly pain, loss of appetite, nausea, light-colored stool, dark yellow or brown urine, yellowing skin or eyes, unusual weakness or fatigue Low red blood cell level--unusual weakness or fatigue, dizziness, headache, trouble breathing Lung injury--shortness of breath or trouble breathing, cough, spitting up blood, chest pain, fever Stomach pain, bloody diarrhea, pale skin, unusual weakness or  fatigue, decrease in the amount of urine, which may be signs of hemolytic uremic syndrome Sudden and severe headache, confusion, change in vision, seizures, which may be signs of posterior reversible encephalopathy syndrome (PRES) Unusual bruising or bleeding Side effects that usually do not require medical attention (report to your care team if they continue or are bothersome): Diarrhea Drowsiness Hair loss Nausea Pain, redness, or swelling with sores inside the mouth or throat Vomiting This list may not describe all possible side effects. Call your doctor for medical advice about side effects. You may report side effects to FDA at 1-800-FDA-1088. Where should I keep my medication? This medication is given in a hospital or clinic. It will not be stored at home. NOTE: This sheet is a summary. It may not cover all possible information. If you have questions about this medicine, talk to your doctor, pharmacist, or health care provider.  2024 Elsevier/Gold Standard (2021-06-17 00:00:00)  Cisplatin Injection What is this medication? CISPLATIN (SIS pla tin) treats some types of cancer. It works by slowing down the growth of cancer cells. This medicine may be used for other purposes; ask your health care provider or pharmacist if you have questions. COMMON BRAND NAME(S): Platinol, Platinol -AQ What should I tell my care team before I take this medication? They need to know if you have any of these conditions: Eye disease, vision problems Hearing problems Kidney disease Low blood counts, such as low white cells, platelets, or red blood cells Tingling of the fingers or toes, or other nerve disorder An unusual or allergic reaction to cisplatin, carboplatin, oxaliplatin, other medications, foods, dyes, or preservatives If you or your partner are pregnant or trying to get pregnant Breast-feeding How should I use this medication? This medication is injected into a vein. It is given by your care  team in a hospital or clinic setting. Talk to your care team about the use of this medication in children. Special care may be needed. Overdosage: If you think you have taken too much of this medicine contact a poison control center or emergency room at once. NOTE: This medicine is only for you. Do not share this medicine with others. What if I miss a dose? Keep appointments for follow-up doses. It is important not to miss your dose. Call your care team if you are unable to keep an appointment. What may interact with this medication? Do not take this medication with any of the following: Live virus vaccines This medication may also interact with the following: Certain antibiotics, such as amikacin, gentamicin, neomycin, polymyxin B, streptomycin, tobramycin, vancomycin Foscarnet This list may not describe all possible interactions. Give your health care provider a list of all the medicines, herbs, non-prescription drugs, or dietary supplements you use. Also tell them if you smoke, drink alcohol, or use illegal drugs. Some items may interact with your medicine. What should I watch for while using this medication? Your condition will be monitored carefully while you are receiving  this medication. You may need blood work done while taking this medication. This medication may make you feel generally unwell. This is not uncommon, as chemotherapy can affect healthy cells as well as cancer cells. Report any side effects. Continue your course of treatment even though you feel ill unless your care team tells you to stop. This medication may increase your risk of getting an infection. Call your care team for advice if you get a fever, chills, sore throat, or other symptoms of a cold or flu. Do not treat yourself. Try to avoid being around people who are sick. Avoid taking medications that contain aspirin, acetaminophen, ibuprofen, naproxen, or ketoprofen unless instructed by your care team. These medications  may hide a fever. This medication may increase your risk to bruise or bleed. Call your care team if you notice any unusual bleeding. Be careful brushing or flossing your teeth or using a toothpick because you may get an infection or bleed more easily. If you have any dental work done, tell your dentist you are receiving this medication. Drink fluids as directed while you are taking this medication. This will help protect your kidneys. Call your care team if you get diarrhea. Do not treat yourself. Talk to your care team if you or your partner wish to become pregnant or think you might be pregnant. This medication can cause serious birth defects if taken during pregnancy and for 14 months after the last dose. A negative pregnancy test is required before starting this medication. A reliable form of contraception is recommended while taking this medication and for 14 months after the last dose. Talk to your care team about effective forms of contraception. Do not father a child while taking this medication and for 11 months after the last dose. Use a condom during sex during this time period. Do not breast-feed while taking this medication. This medication may cause infertility. Talk to your care team if you are concerned about your fertility. What side effects may I notice from receiving this medication? Side effects that you should report to your care team as soon as possible: Allergic reactions--skin rash, itching, hives, swelling of the face, lips, tongue, or throat Eye pain, change in vision, vision loss Hearing loss, ringing in ears Infection--fever, chills, cough, sore throat, wounds that don't heal, pain or trouble when passing urine, general feeling of discomfort or being unwell Kidney injury--decrease in the amount of urine, swelling of the ankles, hands, or feet Low red blood cell level--unusual weakness or fatigue, dizziness, headache, trouble breathing Painful swelling, warmth, or redness  of the skin, blisters or sores at the infusion site Pain, tingling, or numbness in the hands or feet Unusual bruising or bleeding Side effects that usually do not require medical attention (report to your care team if they continue or are bothersome): Hair loss Nausea Vomiting This list may not describe all possible side effects. Call your doctor for medical advice about side effects. You may report side effects to FDA at 1-800-FDA-1088. Where should I keep my medication? This medication is given in a hospital or clinic. It will not be stored at home. NOTE: This sheet is a summary. It may not cover all possible information. If you have questions about this medicine, talk to your doctor, pharmacist, or health care provider.  2024 Elsevier/Gold Standard (2021-06-13 00:00:00)

## 2023-02-01 ENCOUNTER — Encounter: Payer: Self-pay | Admitting: Internal Medicine

## 2023-02-04 ENCOUNTER — Inpatient Hospital Stay (HOSPITAL_BASED_OUTPATIENT_CLINIC_OR_DEPARTMENT_OTHER): Payer: BC Managed Care – PPO | Admitting: Internal Medicine

## 2023-02-04 ENCOUNTER — Inpatient Hospital Stay: Payer: BC Managed Care – PPO

## 2023-02-04 ENCOUNTER — Telehealth: Payer: Self-pay | Admitting: *Deleted

## 2023-02-04 VITALS — BP 129/90 | HR 96 | Temp 97.8°F | Resp 16 | Wt 242.0 lb

## 2023-02-04 DIAGNOSIS — D6481 Anemia due to antineoplastic chemotherapy: Secondary | ICD-10-CM

## 2023-02-04 DIAGNOSIS — Z79899 Other long term (current) drug therapy: Secondary | ICD-10-CM | POA: Diagnosis not present

## 2023-02-04 DIAGNOSIS — R350 Frequency of micturition: Secondary | ICD-10-CM | POA: Diagnosis not present

## 2023-02-04 DIAGNOSIS — C221 Intrahepatic bile duct carcinoma: Secondary | ICD-10-CM | POA: Diagnosis not present

## 2023-02-04 DIAGNOSIS — Z5111 Encounter for antineoplastic chemotherapy: Secondary | ICD-10-CM

## 2023-02-04 DIAGNOSIS — T451X5A Adverse effect of antineoplastic and immunosuppressive drugs, initial encounter: Secondary | ICD-10-CM

## 2023-02-04 DIAGNOSIS — C7800 Secondary malignant neoplasm of unspecified lung: Secondary | ICD-10-CM | POA: Diagnosis not present

## 2023-02-04 DIAGNOSIS — C7801 Secondary malignant neoplasm of right lung: Secondary | ICD-10-CM

## 2023-02-04 LAB — CMP (CANCER CENTER ONLY)
ALT: 74 U/L — ABNORMAL HIGH (ref 0–44)
AST: 40 U/L (ref 15–41)
Albumin: 3.9 g/dL (ref 3.5–5.0)
Alkaline Phosphatase: 91 U/L (ref 38–126)
Anion gap: 9 (ref 5–15)
BUN: 16 mg/dL (ref 6–20)
CO2: 24 mmol/L (ref 22–32)
Calcium: 8.7 mg/dL — ABNORMAL LOW (ref 8.9–10.3)
Chloride: 103 mmol/L (ref 98–111)
Creatinine: 0.61 mg/dL (ref 0.44–1.00)
GFR, Estimated: >60 mL/min (ref >60)
Glucose, Bld: 99 mg/dL (ref 70–99)
Potassium: 4.1 mmol/L (ref 3.5–5.1)
Sodium: 136 mmol/L (ref 135–145)
Total Bilirubin: 0.7 mg/dL (ref <1.2)
Total Protein: 6.6 g/dL (ref 6.5–8.1)

## 2023-02-04 LAB — CBC WITH DIFFERENTIAL (CANCER CENTER ONLY)
Abs Immature Granulocytes: 0.01 10*3/uL (ref 0.00–0.07)
Basophils Absolute: 0 10*3/uL (ref 0.0–0.1)
Basophils Relative: 1 %
Eosinophils Absolute: 0 10*3/uL (ref 0.0–0.5)
Eosinophils Relative: 1 %
HCT: 28 % — ABNORMAL LOW (ref 36.0–46.0)
Hemoglobin: 9.3 g/dL — ABNORMAL LOW (ref 12.0–15.0)
Immature Granulocytes: 0 %
Lymphocytes Relative: 30 %
Lymphs Abs: 0.7 10*3/uL (ref 0.7–4.0)
MCH: 38.1 pg — ABNORMAL HIGH (ref 26.0–34.0)
MCHC: 33.2 g/dL (ref 30.0–36.0)
MCV: 114.8 fL — ABNORMAL HIGH (ref 80.0–100.0)
Monocytes Absolute: 0.1 10*3/uL (ref 0.1–1.0)
Monocytes Relative: 6 %
Neutro Abs: 1.4 10*3/uL — ABNORMAL LOW (ref 1.7–7.7)
Neutrophils Relative %: 62 %
Platelet Count: 132 10*3/uL — ABNORMAL LOW (ref 150–400)
RBC: 2.44 MIL/uL — ABNORMAL LOW (ref 3.87–5.11)
RDW: 16.7 % — ABNORMAL HIGH (ref 11.5–15.5)
WBC Count: 2.3 10*3/uL — ABNORMAL LOW (ref 4.0–10.5)
nRBC: 0 % (ref 0.0–0.2)

## 2023-02-04 LAB — URINALYSIS, COMPLETE (UACMP) WITH MICROSCOPIC
Bacteria, UA: NONE SEEN
Bilirubin Urine: NEGATIVE
Glucose, UA: NEGATIVE mg/dL
Hgb urine dipstick: NEGATIVE
Ketones, ur: NEGATIVE mg/dL
Leukocytes,Ua: NEGATIVE
Nitrite: NEGATIVE
Protein, ur: NEGATIVE mg/dL
RBC / HPF: 0 RBC/hpf (ref 0–5)
Specific Gravity, Urine: 1.013 (ref 1.005–1.030)
Squamous Epithelial / HPF: 0 /[HPF] (ref 0–5)
pH: 5 (ref 5.0–8.0)

## 2023-02-04 LAB — MAGNESIUM: Magnesium: 1.9 mg/dL (ref 1.7–2.4)

## 2023-02-04 MED ORDER — SODIUM CHLORIDE 0.9% FLUSH
10.0000 mL | Freq: Once | INTRAVENOUS | Status: AC
Start: 2023-02-04 — End: 2023-02-04
  Administered 2023-02-04: 10 mL via INTRAVENOUS
  Filled 2023-02-04: qty 10

## 2023-02-04 MED ORDER — HEPARIN SOD (PORK) LOCK FLUSH 100 UNIT/ML IV SOLN
500.0000 [IU] | Freq: Once | INTRAVENOUS | Status: AC
  Administered 2023-02-04: 500 [IU] via INTRAVENOUS
  Filled 2023-02-04: qty 5

## 2023-02-04 MED FILL — Fosaprepitant Dimeglumine For IV Infusion 150 MG (Base Eq): INTRAVENOUS | Qty: 5 | Status: AC

## 2023-02-04 NOTE — Progress Notes (Signed)
[facility] CONSULT NOTE  Patient Care Team: Reubin Milan, MD as PCP - General (Internal Medicine) Glory Buff, RN as Oncology Nurse Navigator Michaelyn Barter, MD as Consulting Physician (Oncology)   CANCER STAGING   Cancer Staging  Cholangiocarcinoma [facility]) Staging form: Intrahepatic Bile Duct, AJCC 8th Edition - Clinical stage from 08/26/2022: Stage IV (cM1) - Signed by Michaelyn Barter, MD on 09/03/2022 Stage prefix: Initial diagnosis   ASSESSMENT & PLAN:  Vicki Phillips 57 y.o. 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  # Intrahepatic Cholangiocarcinoma, stage IV - s/p liver biopsy on 08/26/2022 showed metastatic moderately differentiated adenocarcinoma.  IHC stain positive for CK7 and CK20.  TTF-1, PAX8, GATA3 and CDX2 are negative.  Differentials include cholangiocarcinoma and pancreatic carcinoma.   Plan- -patient sought second opinion with Dr. Hulen Luster at [facility] who agreed with the treatment plan.  Continue with the current plan.  Has follow-up scheduled in January 2025.  -CT imaging after 4 cycles of treatment showed stable to mildly improved disease.   -PD-L1 0%.  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 -Pretreatment CA 19-9 352 -> 130 ->164 ->122 ->159.  Will continue to trend.  -Labs reviewed and acceptable for treatment.  Will proceed with cycle 7-day 8 of cisplatin and gemcitabine.  She will come back on Monday for G-CSF injection.  UA is negative for infection.  - Will schedule her for repeat scan after 8 cycles.  She is scheduled to follow-up at [facility] on January 7.  # Chemotherapy related  anemia -Iron panel consistent with anemia of chronic disease - s/p 1 U PRBC on 01/05/23. - s/p Retacrit 40,000 unit (started on 12/18/2022) x 3 doses.   # Shortness of breath -Stable.  Continue with albuterol inhaler  # Right knee pain -MRI right knee showing  horizontal tear in the posterior horn median meniscus and moderate-sized Baker's cyst with suspected leak or rupture, tricompartmental osteoarthritis.   -Seen by Raechel Chute.  Had cortisone shot with mild improvement.  She will follow-up with them in 4 months.  # Hypertension -Continue with amlodipine 5 mg daily.   # Cough -Continue with Tussionex as needed.  # Poor appetite -Discussed about nutrition consult and appetite stimulant.  Would like to hold off  # History of sarcoidosis -Management per pulmonary -Never required treatment.  Orders Placed This Encounter  Procedures   Urine Culture    Standing Status:   Future    Number of Occurrences:   1    Expected Date:   02/04/2023    Expiration Date:   02/04/2024   Urinalysis, Complete w Microscopic    Standing Status:   Future    Number of Occurrences:   1    Expected Date:   02/04/2023    Expiration Date:   02/04/2024   RTC in 2 weeks for MD visit, labs, cycle 8-day 1 of cisplatin, gemcitabine interval.  The total time spent in the appointment was 30 minutes encounter with patients including review of chart and various tests results, discussions about plan of care and coordination of care plan   All questions were answered. The patient knows to call the clinic with any problems, questions or concerns. No barriers to learning was detected.  Michaelyn Barter, MD 12/12/202412:11 PM   HISTORY OF PRESENTING ILLNESS:  Vicki Phillips 57 y.o. female with pmh of sarcoidosis not on any treatment was referred to oncology for multiple pulmonary nodules and  liver lesion.  Patient reports she presented to ED with complaints of chest pain.  Had chest x-ray which showed pulmonary nodules was referred to pulmonary.  Records not available.  Was then seen by Dr. Aundria Rud on 08/05/2022.  CT chest showed innumerable pulmonary nodules of varying sizes throughout the lung.  Largest spiculated appearing nodule of anterior right upper lobe  measuring 2 x 2 cm, at least 1 ill-defined hypodense lesion of the anterior left lobe of the liver.  Despite stated history of sarcoidosis, there are no specific or characteristic features of sarcoidosis with respect to distribution, morphology, associated fibrotic change, nor associated mediastinal lymphadenopathy. 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  She has history of sarcoidosis but was never on treatment.  Reports that she does not see doctors on a regular basis because she is had bad experiences.  Interval history Patient seen today as follow-up prior to cycle 7-day 8 of gemcitabine, cisplatin Overall feeling better compared to last week.  Reports constipation and has been taking MiraLAX.  Finally had a bowel movement.  Shortness of breath is improved.  Her left flank pain has resolved.  Complains of foul-smelling urine.   I have reviewed her chart and materials related to her cancer extensively and collaborated history with the patient. Summary of oncologic history is as follows: Oncology History  Cholangiocarcinoma (HCC)  08/26/2022 Cancer Staging   Staging form: Intrahepatic Bile Duct, AJCC 8th Edition - Clinical stage from 08/26/2022: Stage IV (cM1) - Signed by Michaelyn Barter, MD on 09/03/2022 Stage prefix: Initial diagnosis   09/03/2022 Initial Diagnosis   Cholangiocarcinoma (HCC)   09/18/2022 -  Chemotherapy   Patient is on Treatment Plan : BILIARY TRACT Cisplatin + Gemcitabine D1,8 + Durvalumab (1500) D1 q21d / Durvalumab (1500) q28d     Metastasis to lung (HCC)  09/03/2022 Initial Diagnosis   Metastasis to lung (HCC)   09/18/2022 -  Chemotherapy   Patient is on Treatment Plan : BILIARY TRACT Cisplatin + Gemcitabine D1,8 + Durvalumab (1500) D1 q21d / Durvalumab (1500) q28d       MEDICAL HISTORY:  Past Medical  History:  Diagnosis Date   Allergy    Bile duct cancer (HCC)    Sarcoidosis 2011    SURGICAL HISTORY: Past Surgical History:  Procedure Laterality Date   CESAREAN SECTION     x 2   COLONOSCOPY WITH PROPOFOL N/A 08/10/2019   Procedure: COLONOSCOPY WITH PROPOFOL;  Surgeon: Midge Minium, MD;  Location: [facility];  Service: Endoscopy;  Laterality: N/A;  priority 4   IR IMAGING GUIDED PORT INSERTION  09/11/2022   LUNG BIOPSY      SOCIAL HISTORY: Social History   Socioeconomic History   Marital status: Single    Spouse name: Not on file   Number of children: 2   Years of education: Not on file   Highest education level: Not on file  Occupational History   Not on file  Tobacco Use   Smoking status: Never   Smokeless tobacco: Never  Vaping Use   Vaping status: Never Used  Substance and Sexual Activity   Alcohol use: Not Currently    Alcohol/week: 4.0 standard drinks of alcohol    Types: 4 Glasses of wine per week   Drug use: No   Sexual activity: Yes    Partners: Male    Birth control/protection: Surgical  Other Topics Concern   Not on file  Social History Narrative   Not on file   Social Drivers of Health   Financial Resource Strain: Low Risk  (09/18/2022)   Overall Financial Resource Strain (CARDIA)    Difficulty of Paying Living Expenses: Not hard at all  Food Insecurity: No Food Insecurity (08/24/2022)   Hunger Vital Sign    Worried About Running Out of Food in the Last Year: Never true    Ran Out of Food in the Last Year: Never true  Transportation Needs: No Transportation Needs (08/24/2022)   PRAPARE - Administrator, Civil Service (Medical): No    Lack of Transportation (Non-Medical): No  Physical Activity: Not on file  Stress: Not on file  Social Connections: Not on file  Intimate Partner Violence: Not At Risk (08/24/2022)   Humiliation, Afraid, Rape, and Kick questionnaire    Fear of Current or Ex-Partner: No    Emotionally Abused: No     Physically Abused: No    Sexually Abused: No    FAMILY HISTORY: Family History  Problem Relation Age of Onset   Stroke Mother    Heart disease Mother    Heart attack Father 11   Heart disease Father    Kidney disease Sister     ALLERGIES:  is allergic to other.  MEDICATIONS:  Current Outpatient Medications  Medication Sig Dispense Refill   albuterol (VENTOLIN HFA) 108 (90 Base) MCG/ACT inhaler Inhale 2 puffs into the lungs every 6 (six) hours as needed for wheezing or shortness of breath. 8 g 0   amLODipine (NORVASC) 5 MG tablet Take 1 tablet (5 mg total) by mouth daily. 90 tablet 0   cefdinir (OMNICEF) 300 MG capsule Take 1 capsule (300 mg total) by mouth 2 (two) times daily for 10 days. 20 capsule 0   chlorpheniramine-HYDROcodone (TUSSIONEX) 10-8 MG/5ML Take 5 mLs by mouth at bedtime as needed for cough. 140 mL 0   dexamethasone (DECADRON) 4 MG tablet Take 2 tablets (8 mg) by mouth daily x 3 days starting the day after cisplatin chemotherapy. Take with food. 30 tablet 1   loratadine (CLARITIN) 10 MG tablet Take 10 mg by mouth daily.     ondansetron (ZOFRAN) 8 MG tablet Take 1 tablet (8 mg total) by mouth every 8 (eight) hours as needed for nausea or vomiting. Start on the third day after cisplatin. (Patient not taking: Reported on 11/26/2022) 30 tablet 1   prochlorperazine (COMPAZINE) 10 MG tablet Take 1 tablet (10 mg total) by mouth every 6 (six) hours as needed (Nausea or vomiting). (Patient not taking: Reported on 11/05/2022) 30 tablet 1   pseudoephedrine-acetaminophen (TYLENOL SINUS) 30-500 MG TABS tablet Take 1 tablet by mouth in the morning and at bedtime.     No current facility-administered medications for this visit.    REVIEW OF SYSTEMS:   Pertinent information mentioned in HPI All other systems were reviewed with the patient and are negative.  PHYSICAL EXAMINATION: ECOG PERFORMANCE STATUS: 1 - Symptomatic but completely ambulatory  Vitals:   02/04/23 0926  BP: (!)  129/90  Pulse: 96  Resp: 16  Temp: 97.8 F (36.6 C)  SpO2: 99%      Filed Weights   02/04/23 0926  Weight: 242 lb (109.8 kg)       GENERAL:alert, no distress and comfortable SKIN: skin color, texture, turgor are normal, no rashes or significant lesions EYES: normal, conjunctiva are pink and non-injected, sclera clear OROPHARYNX:no exudate,  no erythema and lips, buccal mucosa, and tongue normal  NECK: supple, thyroid normal size, non-tender, without nodularity LYMPH:  no palpable lymphadenopathy in the cervical, axillary or inguinal LUNGS: clear to auscultation and percussion with normal breathing effort HEART: regular rate & rhythm and no murmurs and no lower extremity edema ABDOMEN:abdomen soft, non-tender and normal bowel sounds Musculoskeletal:no cyanosis of digits and no clubbing  PSYCH: alert & oriented x 3 with fluent speech NEURO: no focal motor/sensory deficits  LABORATORY DATA:  I have reviewed the data as listed Lab Results  Component Value Date   WBC 2.3 (L) 02/04/2023   HGB 9.3 (L) 02/04/2023   HCT 28.0 (L) 02/04/2023   MCV 114.8 (H) 02/04/2023   PLT 132 (L) 02/04/2023   Recent Labs    01/07/23 0935 01/27/23 1524 01/28/23 0928 02/04/23 0910  NA 135 136 137 136  K 4.1 3.8 3.7 4.1  CL 102 104 104 103  CO2 25 23 24 24   GLUCOSE 102* 106* 106* 99  BUN 17 18 11 16   CREATININE 0.57 1.11* 0.60 0.61  CALCIUM 9.3 9.1 9.2 8.7*  GFRNONAA >60 58* >60 >60  PROT 6.8  --  7.8 6.6  ALBUMIN 3.8  --  4.4 3.9  AST 37  --  35 40  ALT 56*  --  28 74*  ALKPHOS 99  --  124 91  BILITOT 0.5  --  0.5 0.7    RADIOGRAPHIC STUDIES: I have personally reviewed the radiological images as listed and agreed with the findings in the report. No results found.

## 2023-02-04 NOTE — Progress Notes (Signed)
Patient is still waiting to see if the urine culture came back so she can start taking the antibiotics. Her urine is darker colored, smells a little bit as well.

## 2023-02-04 NOTE — Telephone Encounter (Signed)
Per Dr. Randa Evens says that you will get tx tom. And so far the UA was negative. Pt. Aware and thankful for the call

## 2023-02-05 ENCOUNTER — Inpatient Hospital Stay: Payer: BC Managed Care – PPO

## 2023-02-05 VITALS — BP 149/92 | HR 100 | Temp 98.2°F | Resp 18

## 2023-02-05 DIAGNOSIS — T451X5A Adverse effect of antineoplastic and immunosuppressive drugs, initial encounter: Secondary | ICD-10-CM | POA: Diagnosis not present

## 2023-02-05 DIAGNOSIS — C221 Intrahepatic bile duct carcinoma: Secondary | ICD-10-CM | POA: Diagnosis not present

## 2023-02-05 DIAGNOSIS — D6481 Anemia due to antineoplastic chemotherapy: Secondary | ICD-10-CM | POA: Diagnosis not present

## 2023-02-05 DIAGNOSIS — C7801 Secondary malignant neoplasm of right lung: Secondary | ICD-10-CM

## 2023-02-05 DIAGNOSIS — C7800 Secondary malignant neoplasm of unspecified lung: Secondary | ICD-10-CM | POA: Diagnosis not present

## 2023-02-05 DIAGNOSIS — Z79899 Other long term (current) drug therapy: Secondary | ICD-10-CM | POA: Diagnosis not present

## 2023-02-05 DIAGNOSIS — Z5111 Encounter for antineoplastic chemotherapy: Secondary | ICD-10-CM | POA: Diagnosis not present

## 2023-02-05 LAB — URINE CULTURE: Culture: <10000 — AB

## 2023-02-05 MED ORDER — SODIUM CHLORIDE 0.9 % IV SOLN
150.0000 mg | Freq: Once | INTRAVENOUS | Status: AC
  Administered 2023-02-05: 150 mg via INTRAVENOUS
  Filled 2023-02-05: qty 150

## 2023-02-05 MED ORDER — DEXAMETHASONE SODIUM PHOSPHATE 10 MG/ML IJ SOLN
10.0000 mg | Freq: Once | INTRAMUSCULAR | Status: AC
  Administered 2023-02-05: 10 mg via INTRAVENOUS
  Filled 2023-02-05: qty 1

## 2023-02-05 MED ORDER — SODIUM CHLORIDE 0.9% FLUSH
10.0000 mL | INTRAVENOUS | Status: DC | PRN
  Administered 2023-02-05: 10 mL
  Filled 2023-02-05: qty 10

## 2023-02-05 MED ORDER — MAGNESIUM SULFATE 2 GM/50ML IV SOLN
2.0000 g | Freq: Once | INTRAVENOUS | Status: AC
  Administered 2023-02-05: 2 g via INTRAVENOUS
  Filled 2023-02-05: qty 50

## 2023-02-05 MED ORDER — SODIUM CHLORIDE 0.9 % IV SOLN
Freq: Once | INTRAVENOUS | Status: AC
  Filled 2023-02-05: qty 250

## 2023-02-05 MED ORDER — POTASSIUM CHLORIDE IN NACL 20-0.9 MEQ/L-% IV SOLN
Freq: Once | INTRAVENOUS | Status: AC
Start: 2023-02-05 — End: 2023-02-05
  Filled 2023-02-05: qty 1000

## 2023-02-05 MED ORDER — PALONOSETRON HCL INJECTION 0.25 MG/5ML
0.2500 mg | Freq: Once | INTRAVENOUS | Status: AC
  Administered 2023-02-05: 0.25 mg via INTRAVENOUS
  Filled 2023-02-05: qty 5

## 2023-02-05 MED ORDER — HEPARIN SOD (PORK) LOCK FLUSH 100 UNIT/ML IV SOLN
500.0000 [IU] | Freq: Once | INTRAVENOUS | Status: AC | PRN
  Administered 2023-02-05: 500 [IU]
  Filled 2023-02-05: qty 5

## 2023-02-05 MED ORDER — SODIUM CHLORIDE 0.9 % IV SOLN
1000.0000 mg/m2 | Freq: Once | INTRAVENOUS | Status: AC
  Administered 2023-02-05: 2318 mg via INTRAVENOUS
  Filled 2023-02-05: qty 60.96

## 2023-02-05 MED ORDER — CISPLATIN CHEMO INJECTION 100MG/100ML
25.0000 mg/m2 | Freq: Once | INTRAVENOUS | Status: AC
  Administered 2023-02-05: 58 mg via INTRAVENOUS
  Filled 2023-02-05: qty 58

## 2023-02-08 ENCOUNTER — Inpatient Hospital Stay: Payer: BC Managed Care – PPO

## 2023-02-08 DIAGNOSIS — C221 Intrahepatic bile duct carcinoma: Secondary | ICD-10-CM | POA: Diagnosis not present

## 2023-02-08 DIAGNOSIS — Z5111 Encounter for antineoplastic chemotherapy: Secondary | ICD-10-CM | POA: Diagnosis not present

## 2023-02-08 DIAGNOSIS — D6481 Anemia due to antineoplastic chemotherapy: Secondary | ICD-10-CM | POA: Diagnosis not present

## 2023-02-08 DIAGNOSIS — C7801 Secondary malignant neoplasm of right lung: Secondary | ICD-10-CM

## 2023-02-08 DIAGNOSIS — T451X5A Adverse effect of antineoplastic and immunosuppressive drugs, initial encounter: Secondary | ICD-10-CM | POA: Diagnosis not present

## 2023-02-08 DIAGNOSIS — Z79899 Other long term (current) drug therapy: Secondary | ICD-10-CM | POA: Diagnosis not present

## 2023-02-08 DIAGNOSIS — C7800 Secondary malignant neoplasm of unspecified lung: Secondary | ICD-10-CM | POA: Diagnosis not present

## 2023-02-08 MED ORDER — PEGFILGRASTIM-JMDB 6 MG/0.6ML ~~LOC~~ SOSY
6.0000 mg | PREFILLED_SYRINGE | Freq: Once | SUBCUTANEOUS | Status: AC
  Administered 2023-02-08: 6 mg via SUBCUTANEOUS
  Filled 2023-02-08: qty 0.6

## 2023-02-09 ENCOUNTER — Encounter: Payer: Self-pay | Admitting: Internal Medicine

## 2023-02-11 ENCOUNTER — Encounter: Payer: Self-pay | Admitting: Internal Medicine

## 2023-02-18 ENCOUNTER — Encounter: Payer: Self-pay | Admitting: Internal Medicine

## 2023-02-18 ENCOUNTER — Inpatient Hospital Stay: Payer: BC Managed Care – PPO | Admitting: Internal Medicine

## 2023-02-18 ENCOUNTER — Inpatient Hospital Stay: Payer: BC Managed Care – PPO

## 2023-02-18 VITALS — BP 138/90 | HR 96 | Temp 98.4°F | Resp 18 | Wt 235.0 lb

## 2023-02-18 DIAGNOSIS — Z79899 Other long term (current) drug therapy: Secondary | ICD-10-CM | POA: Diagnosis not present

## 2023-02-18 DIAGNOSIS — C221 Intrahepatic bile duct carcinoma: Secondary | ICD-10-CM | POA: Diagnosis not present

## 2023-02-18 DIAGNOSIS — C7800 Secondary malignant neoplasm of unspecified lung: Secondary | ICD-10-CM | POA: Diagnosis not present

## 2023-02-18 DIAGNOSIS — Z5111 Encounter for antineoplastic chemotherapy: Secondary | ICD-10-CM

## 2023-02-18 DIAGNOSIS — Z95828 Presence of other vascular implants and grafts: Secondary | ICD-10-CM

## 2023-02-18 DIAGNOSIS — D6481 Anemia due to antineoplastic chemotherapy: Secondary | ICD-10-CM | POA: Diagnosis not present

## 2023-02-18 DIAGNOSIS — C7801 Secondary malignant neoplasm of right lung: Secondary | ICD-10-CM

## 2023-02-18 DIAGNOSIS — C7802 Secondary malignant neoplasm of left lung: Secondary | ICD-10-CM | POA: Diagnosis not present

## 2023-02-18 DIAGNOSIS — T451X5A Adverse effect of antineoplastic and immunosuppressive drugs, initial encounter: Secondary | ICD-10-CM | POA: Diagnosis not present

## 2023-02-18 LAB — CBC WITH DIFFERENTIAL (CANCER CENTER ONLY)
Abs Immature Granulocytes: 0.09 10*3/uL — ABNORMAL HIGH (ref 0.00–0.07)
Basophils Absolute: 0 10*3/uL (ref 0.0–0.1)
Basophils Relative: 0 %
Eosinophils Absolute: 0.1 10*3/uL (ref 0.0–0.5)
Eosinophils Relative: 2 %
HCT: 30.6 % — ABNORMAL LOW (ref 36.0–46.0)
Hemoglobin: 9.9 g/dL — ABNORMAL LOW (ref 12.0–15.0)
Immature Granulocytes: 1 %
Lymphocytes Relative: 11 %
Lymphs Abs: 0.8 10*3/uL (ref 0.7–4.0)
MCH: 37.9 pg — ABNORMAL HIGH (ref 26.0–34.0)
MCHC: 32.4 g/dL (ref 30.0–36.0)
MCV: 117.2 fL — ABNORMAL HIGH (ref 80.0–100.0)
Monocytes Absolute: 0.4 10*3/uL (ref 0.1–1.0)
Monocytes Relative: 5 %
Neutro Abs: 5.9 10*3/uL (ref 1.7–7.7)
Neutrophils Relative %: 81 %
Platelet Count: 115 10*3/uL — ABNORMAL LOW (ref 150–400)
RBC: 2.61 MIL/uL — ABNORMAL LOW (ref 3.87–5.11)
RDW: 17.3 % — ABNORMAL HIGH (ref 11.5–15.5)
WBC Count: 7.3 10*3/uL (ref 4.0–10.5)
nRBC: 0 % (ref 0.0–0.2)

## 2023-02-18 LAB — CMP (CANCER CENTER ONLY)
ALT: 24 U/L (ref 0–44)
AST: 28 U/L (ref 15–41)
Albumin: 3.9 g/dL (ref 3.5–5.0)
Alkaline Phosphatase: 142 U/L — ABNORMAL HIGH (ref 38–126)
Anion gap: 11 (ref 5–15)
BUN: 13 mg/dL (ref 6–20)
CO2: 22 mmol/L (ref 22–32)
Calcium: 8.7 mg/dL — ABNORMAL LOW (ref 8.9–10.3)
Chloride: 103 mmol/L (ref 98–111)
Creatinine: 0.62 mg/dL (ref 0.44–1.00)
GFR, Estimated: >60 mL/min (ref >60)
Glucose, Bld: 106 mg/dL — ABNORMAL HIGH (ref 70–99)
Potassium: 4 mmol/L (ref 3.5–5.1)
Sodium: 136 mmol/L (ref 135–145)
Total Bilirubin: 0.4 mg/dL (ref <1.2)
Total Protein: 7.1 g/dL (ref 6.5–8.1)

## 2023-02-18 LAB — MAGNESIUM: Magnesium: 1.7 mg/dL (ref 1.7–2.4)

## 2023-02-18 MED ORDER — SODIUM CHLORIDE 0.9% FLUSH
10.0000 mL | Freq: Once | INTRAVENOUS | Status: AC
  Administered 2023-02-18: 10 mL via INTRAVENOUS
  Filled 2023-02-18: qty 10

## 2023-02-18 MED ORDER — HEPARIN SOD (PORK) LOCK FLUSH 100 UNIT/ML IV SOLN
500.0000 [IU] | Freq: Once | INTRAVENOUS | Status: AC
Start: 2023-02-18 — End: 2023-02-18
  Administered 2023-02-18: 500 [IU] via INTRAVENOUS
  Filled 2023-02-18: qty 5

## 2023-02-18 MED ORDER — DEXAMETHASONE 4 MG PO TABS: ORAL_TABLET | ORAL | 0 refills | Status: DC

## 2023-02-18 MED FILL — Fosaprepitant Dimeglumine For IV Infusion 150 MG (Base Eq): INTRAVENOUS | Qty: 5 | Status: AC

## 2023-02-18 NOTE — Progress Notes (Signed)
[facility] CONSULT NOTE  Patient Care Team: Reubin Milan, MD as PCP - General (Internal Medicine) Glory Buff, RN as Oncology Nurse Navigator Michaelyn Barter, MD as Consulting Physician (Oncology)   CANCER STAGING   Cancer Staging  Cholangiocarcinoma [facility]) Staging form: Intrahepatic Bile Duct, AJCC 8th Edition - Clinical stage from 08/26/2022: Stage IV (cM1) - Signed by Michaelyn Barter, MD on 09/03/2022 Stage prefix: Initial diagnosis   ASSESSMENT & PLAN:  Vicki Phillips 57 y.o. 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  # Intrahepatic Cholangiocarcinoma, stage IV - s/p liver biopsy on 08/26/2022 showed metastatic moderately differentiated adenocarcinoma.  IHC stain positive for CK7 and CK20.  TTF-1, PAX8, GATA3 and CDX2 are negative.  Differentials include cholangiocarcinoma and pancreatic carcinoma.   Plan- -patient sought second opinion with Dr. Hulen Luster at [facility] who agreed with the treatment plan.  Continue with the current plan.  Has follow-up scheduled in January 2025.  -CT imaging after 4 cycles of treatment showed stable to mildly improved disease.   -PD-L1 0%.  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 -Pretreatment CA 19-9 352 -> 130 ->164 ->122 ->159.  Will continue to trend.  -Patient was seen today prior to cycle 8-day 1 of gemcitabine, cisplatin and durvalumab.  Patient reports that her brother is flying in from [place] after 4 years and she prefers to spend time with him.  Requesting to defer treatment by 1 week.  I will follow-up with her in 1 week for treatment.  After 8 cycles of chemo, plan is to obtain CT chest abdomen pelvis which is scheduled for January 3.  Then on maintenance Durvalumab every month.  # Chemotherapy related  anemia -Iron panel consistent with anemia of chronic disease - s/p 1 U PRBC on 01/05/23. - s/p Retacrit 40,000 unit (started  on 12/18/2022) x 3 doses.   # Shortness of breath -Stable.  Continue with albuterol inhaler  # Right knee pain -MRI right knee showing horizontal tear in the posterior horn median meniscus and moderate-sized Baker's cyst with suspected leak or rupture, tricompartmental osteoarthritis.   -Seen by Raechel Chute.  Had cortisone shot with mild improvement.  She will follow-up with them in 4 months.  # Hypertension -Continue with amlodipine 5 mg daily.   # Cough -Continue with Tussionex as needed.  # Poor appetite -Discussed about nutrition consult and appetite stimulant.  Would like to hold off  # History of sarcoidosis -Management per pulmonary -Never required treatment.  Orders Placed This Encounter  Procedures   CBC with Differential (Cancer Center Only)    Standing Status:   Future    Expected Date:   02/25/2023    Expiration Date:   02/25/2024   CMP (Cancer Center only)    Standing Status:   Future    Expected Date:   02/25/2023    Expiration Date:   02/25/2024   Magnesium    Standing Status:   Future    Expected Date:   02/25/2023    Expiration Date:   02/25/2024   RTC in 1 week for MD visit, labs, cycle 8-day 1 of gemcitabine, cisplatin and durvalumab  The total time spent in the appointment was 30 minutes encounter with patients including review of chart and various tests results, discussions about plan of care and coordination of care plan   All questions were answered. The patient knows to call the clinic with any problems, questions or concerns.  No barriers to learning was detected.  Michaelyn Barter, MD 12/26/202411:35 AM   HISTORY OF PRESENTING ILLNESS:  Vicki Phillips 57 y.o. 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  Patient reports she presented to ED with complaints of chest pain.  Had chest x-ray which showed pulmonary nodules was referred to pulmonary.  Records not available.  Was  then seen by Dr. Aundria Rud on 08/05/2022.  CT chest showed innumerable pulmonary nodules of varying sizes throughout the lung.  Largest spiculated appearing nodule of anterior right upper lobe measuring 2 x 2 cm, at least 1 ill-defined hypodense lesion of the anterior left lobe of the liver.  Despite stated history of sarcoidosis, there are no specific or characteristic features of sarcoidosis with respect to distribution, morphology, associated fibrotic change, nor associated mediastinal lymphadenopathy. 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  She has history of sarcoidosis but was never on treatment.  Reports that she does not see doctors on a regular basis because she is had bad experiences.  Interval history Patient seen today as follow-up prior to cycle 8-day 1 of gemcitabine, cisplatin, durva Reports feeling well overall.  Has some nausea.  Tells me there is some stomach bug going on in her family.  Has mild diarrhea which is not different from her usual chemo.  Takes Imodium as needed.  Shortness of breath is stable.    I have reviewed her chart and materials related to her cancer extensively and collaborated history with the patient. Summary of oncologic history is as follows: Oncology History  Cholangiocarcinoma (HCC)  08/26/2022 Cancer Staging   Staging form: Intrahepatic Bile Duct, AJCC 8th Edition - Clinical stage from 08/26/2022: Stage IV (cM1) - Signed by Michaelyn Barter, MD on 09/03/2022 Stage prefix: Initial diagnosis   09/03/2022 Initial Diagnosis   Cholangiocarcinoma (HCC)   09/18/2022 -  Chemotherapy   Patient is on Treatment Plan : BILIARY TRACT Cisplatin + Gemcitabine D1,8 + Durvalumab (1500) D1 q21d / Durvalumab (1500) q28d     Metastasis to lung (HCC)  09/03/2022 Initial Diagnosis   Metastasis to lung (HCC)    09/18/2022 -  Chemotherapy   Patient is on Treatment Plan : BILIARY TRACT Cisplatin + Gemcitabine D1,8 + Durvalumab (1500) D1 q21d / Durvalumab (1500) q28d       MEDICAL HISTORY:  Past Medical History:  Diagnosis Date   Allergy    Bile duct cancer (HCC)    Sarcoidosis 2011    SURGICAL HISTORY: Past Surgical History:  Procedure Laterality Date   CESAREAN SECTION     x 2   COLONOSCOPY WITH PROPOFOL N/A 08/10/2019   Procedure: COLONOSCOPY WITH PROPOFOL;  Surgeon: Midge Minium, MD;  Location: [facility];  Service: Endoscopy;  Laterality: N/A;  priority 4   IR IMAGING GUIDED PORT INSERTION  09/11/2022   LUNG BIOPSY      SOCIAL HISTORY: Social History   Socioeconomic History   Marital status: Single    Spouse name: Not on file   Number of children: 2   Years of education: Not on file   Highest education level: Not on file  Occupational History   Not on file  Tobacco Use   Smoking status: Never   Smokeless tobacco: Never  Vaping Use   Vaping status: Never Used  Substance and Sexual  Activity   Alcohol use: Not Currently    Alcohol/week: 4.0 standard drinks of alcohol    Types: 4 Glasses of wine per week   Drug use: No   Sexual activity: Yes    Partners: Male    Birth control/protection: Surgical  Other Topics Concern   Not on file  Social History Narrative   Not on file   Social Drivers of Health   Financial Resource Strain: Low Risk  (09/18/2022)   Overall Financial Resource Strain (CARDIA)    Difficulty of Paying Living Expenses: Not hard at all  Food Insecurity: No Food Insecurity (08/24/2022)   Hunger Vital Sign    Worried About Running Out of Food in the Last Year: Never true    Ran Out of Food in the Last Year: Never true  Transportation Needs: No Transportation Needs (08/24/2022)   PRAPARE - Administrator, Civil Service (Medical): No    Lack of Transportation (Non-Medical): No  Physical Activity: Not on file  Stress: Not on file   Social Connections: Not on file  Intimate Partner Violence: Not At Risk (08/24/2022)   Humiliation, Afraid, Rape, and Kick questionnaire    Fear of Current or Ex-Partner: No    Emotionally Abused: No    Physically Abused: No    Sexually Abused: No    FAMILY HISTORY: Family History  Problem Relation Age of Onset   Stroke Mother    Heart disease Mother    Heart attack Father 86   Heart disease Father    Kidney disease Sister     ALLERGIES:  is allergic to other.  MEDICATIONS:  Current Outpatient Medications  Medication Sig Dispense Refill   albuterol (VENTOLIN HFA) 108 (90 Base) MCG/ACT inhaler Inhale 2 puffs into the lungs every 6 (six) hours as needed for wheezing or shortness of breath. 8 g 0   amLODipine (NORVASC) 5 MG tablet Take 1 tablet (5 mg total) by mouth daily. 90 tablet 0   chlorpheniramine-HYDROcodone (TUSSIONEX) 10-8 MG/5ML Take 5 mLs by mouth at bedtime as needed for cough. 140 mL 0   dexamethasone (DECADRON) 4 MG tablet Take 2 tablets (8 mg) by mouth daily x 3 days starting the day after cisplatin chemotherapy. Take with food. 30 tablet 0   loratadine (CLARITIN) 10 MG tablet Take 10 mg by mouth daily.     ondansetron (ZOFRAN) 8 MG tablet Take 1 tablet (8 mg total) by mouth every 8 (eight) hours as needed for nausea or vomiting. Start on the third day after cisplatin. (Patient not taking: Reported on 11/26/2022) 30 tablet 1   prochlorperazine (COMPAZINE) 10 MG tablet Take 1 tablet (10 mg total) by mouth every 6 (six) hours as needed (Nausea or vomiting). (Patient not taking: Reported on 11/05/2022) 30 tablet 1   pseudoephedrine-acetaminophen (TYLENOL SINUS) 30-500 MG TABS tablet Take 1 tablet by mouth in the morning and at bedtime.     No current facility-administered medications for this visit.    REVIEW OF SYSTEMS:   Pertinent information mentioned in HPI All other systems were reviewed with the patient and are negative.  PHYSICAL EXAMINATION: ECOG PERFORMANCE  STATUS: 1 - Symptomatic but completely ambulatory  Vitals:   02/18/23 0902  BP: (!) 138/90  Pulse: 96  Resp: 18  Temp: 98.4 F (36.9 C)  SpO2: 99%      Filed Weights   02/18/23 0902  Weight: 235 lb (106.6 kg)       GENERAL:alert, no distress and comfortable  SKIN: skin color, texture, turgor are normal, no rashes or significant lesions EYES: normal, conjunctiva are pink and non-injected, sclera clear OROPHARYNX:no exudate, no erythema and lips, buccal mucosa, and tongue normal  NECK: supple, thyroid normal size, non-tender, without nodularity LYMPH:  no palpable lymphadenopathy in the cervical, axillary or inguinal LUNGS: clear to auscultation and percussion with normal breathing effort HEART: regular rate & rhythm and no murmurs and no lower extremity edema ABDOMEN:abdomen soft, non-tender and normal bowel sounds Musculoskeletal:no cyanosis of digits and no clubbing  PSYCH: alert & oriented x 3 with fluent speech NEURO: no focal motor/sensory deficits  LABORATORY DATA:  I have reviewed the data as listed Lab Results  Component Value Date   WBC 7.3 02/18/2023   HGB 9.9 (L) 02/18/2023   HCT 30.6 (L) 02/18/2023   MCV 117.2 (H) 02/18/2023   PLT 115 (L) 02/18/2023   Recent Labs    01/28/23 0928 02/04/23 0910 02/18/23 0841  NA 137 136 136  K 3.7 4.1 4.0  CL 104 103 103  CO2 24 24 22   GLUCOSE 106* 99 106*  BUN 11 16 13   CREATININE 0.60 0.61 0.62  CALCIUM 9.2 8.7* 8.7*  GFRNONAA >60 >60 >60  PROT 7.8 6.6 7.1  ALBUMIN 4.4 3.9 3.9  AST 35 40 28  ALT 28 74* 24  ALKPHOS 124 91 142*  BILITOT 0.5 0.7 0.4    RADIOGRAPHIC STUDIES: I have personally reviewed the radiological images as listed and agreed with the findings in the report. No results found.

## 2023-02-18 NOTE — Progress Notes (Signed)
Patient needs a refill on her decradon, and she would like to move her chemo treatment for tomorrow due to family flying in from [place].

## 2023-02-19 ENCOUNTER — Inpatient Hospital Stay: Payer: BC Managed Care – PPO

## 2023-02-19 ENCOUNTER — Other Ambulatory Visit: Payer: Self-pay

## 2023-02-19 LAB — CANCER ANTIGEN 19-9: CA 19-9: 157 U/mL — ABNORMAL HIGH (ref 0–35)

## 2023-02-23 ENCOUNTER — Inpatient Hospital Stay: Payer: BC Managed Care – PPO | Admitting: Internal Medicine

## 2023-02-23 ENCOUNTER — Other Ambulatory Visit: Payer: BC Managed Care – PPO

## 2023-02-23 ENCOUNTER — Inpatient Hospital Stay: Payer: BC Managed Care – PPO

## 2023-02-23 ENCOUNTER — Ambulatory Visit: Payer: BC Managed Care – PPO | Admitting: Internal Medicine

## 2023-02-23 ENCOUNTER — Encounter: Payer: Self-pay | Admitting: Internal Medicine

## 2023-02-23 DIAGNOSIS — Z95828 Presence of other vascular implants and grafts: Secondary | ICD-10-CM

## 2023-02-23 DIAGNOSIS — D6481 Anemia due to antineoplastic chemotherapy: Secondary | ICD-10-CM | POA: Diagnosis not present

## 2023-02-23 DIAGNOSIS — C221 Intrahepatic bile duct carcinoma: Secondary | ICD-10-CM

## 2023-02-23 DIAGNOSIS — T451X5A Adverse effect of antineoplastic and immunosuppressive drugs, initial encounter: Secondary | ICD-10-CM | POA: Diagnosis not present

## 2023-02-23 DIAGNOSIS — Z79899 Other long term (current) drug therapy: Secondary | ICD-10-CM | POA: Diagnosis not present

## 2023-02-23 DIAGNOSIS — Z5111 Encounter for antineoplastic chemotherapy: Secondary | ICD-10-CM | POA: Diagnosis not present

## 2023-02-23 DIAGNOSIS — C7801 Secondary malignant neoplasm of right lung: Secondary | ICD-10-CM

## 2023-02-23 DIAGNOSIS — C7800 Secondary malignant neoplasm of unspecified lung: Secondary | ICD-10-CM | POA: Diagnosis not present

## 2023-02-23 LAB — CMP (CANCER CENTER ONLY)
ALT: 22 U/L (ref 0–44)
AST: 27 U/L (ref 15–41)
Albumin: 4 g/dL (ref 3.5–5.0)
Alkaline Phosphatase: 101 U/L (ref 38–126)
Anion gap: 10 (ref 5–15)
BUN: 18 mg/dL (ref 6–20)
CO2: 24 mmol/L (ref 22–32)
Calcium: 8.7 mg/dL — ABNORMAL LOW (ref 8.9–10.3)
Chloride: 105 mmol/L (ref 98–111)
Creatinine: 0.7 mg/dL (ref 0.44–1.00)
GFR, Estimated: >60 mL/min (ref >60)
Glucose, Bld: 89 mg/dL (ref 70–99)
Potassium: 3.6 mmol/L (ref 3.5–5.1)
Sodium: 139 mmol/L (ref 135–145)
Total Bilirubin: 0.4 mg/dL (ref 0.0–1.2)
Total Protein: 6.9 g/dL (ref 6.5–8.1)

## 2023-02-23 LAB — CBC WITH DIFFERENTIAL (CANCER CENTER ONLY)
Abs Immature Granulocytes: 0.07 10*3/uL (ref 0.00–0.07)
Basophils Absolute: 0 10*3/uL (ref 0.0–0.1)
Basophils Relative: 0 %
Eosinophils Absolute: 0 10*3/uL (ref 0.0–0.5)
Eosinophils Relative: 0 %
HCT: 32 % — ABNORMAL LOW (ref 36.0–46.0)
Hemoglobin: 10.3 g/dL — ABNORMAL LOW (ref 12.0–15.0)
Immature Granulocytes: 1 %
Lymphocytes Relative: 18 %
Lymphs Abs: 1.6 10*3/uL (ref 0.7–4.0)
MCH: 38.4 pg — ABNORMAL HIGH (ref 26.0–34.0)
MCHC: 32.2 g/dL (ref 30.0–36.0)
MCV: 119.4 fL — ABNORMAL HIGH (ref 80.0–100.0)
Monocytes Absolute: 0.9 10*3/uL (ref 0.1–1.0)
Monocytes Relative: 10 %
Neutro Abs: 6.2 10*3/uL (ref 1.7–7.7)
Neutrophils Relative %: 71 %
Platelet Count: 230 10*3/uL (ref 150–400)
RBC: 2.68 MIL/uL — ABNORMAL LOW (ref 3.87–5.11)
RDW: 17.4 % — ABNORMAL HIGH (ref 11.5–15.5)
WBC Count: 8.7 10*3/uL (ref 4.0–10.5)
nRBC: 0 % (ref 0.0–0.2)

## 2023-02-23 LAB — MAGNESIUM: Magnesium: 1.9 mg/dL (ref 1.7–2.4)

## 2023-02-23 MED ORDER — SODIUM CHLORIDE 0.9% FLUSH
10.0000 mL | Freq: Once | INTRAVENOUS | Status: AC
Start: 2023-02-23 — End: 2023-02-23
  Administered 2023-02-23: 10 mL via INTRAVENOUS
  Filled 2023-02-23: qty 10

## 2023-02-23 MED ORDER — HEPARIN SOD (PORK) LOCK FLUSH 100 UNIT/ML IV SOLN
500.0000 [IU] | Freq: Once | INTRAVENOUS | Status: AC
Start: 2023-02-23 — End: 2023-02-23
  Administered 2023-02-23: 500 [IU] via INTRAVENOUS
  Filled 2023-02-23: qty 5

## 2023-02-23 MED FILL — Fosaprepitant Dimeglumine For IV Infusion 150 MG (Base Eq): INTRAVENOUS | Qty: 5 | Status: AC

## 2023-02-25 ENCOUNTER — Ambulatory Visit: Payer: BC Managed Care – PPO

## 2023-02-25 ENCOUNTER — Inpatient Hospital Stay: Payer: BC Managed Care – PPO | Attending: Internal Medicine

## 2023-02-25 ENCOUNTER — Ambulatory Visit: Payer: BC Managed Care – PPO | Admitting: Internal Medicine

## 2023-02-25 ENCOUNTER — Other Ambulatory Visit: Payer: BC Managed Care – PPO

## 2023-02-25 VITALS — BP 146/90 | HR 86 | Temp 98.1°F | Resp 20 | Wt 237.0 lb

## 2023-02-25 DIAGNOSIS — C7800 Secondary malignant neoplasm of unspecified lung: Secondary | ICD-10-CM | POA: Insufficient documentation

## 2023-02-25 DIAGNOSIS — Z5111 Encounter for antineoplastic chemotherapy: Secondary | ICD-10-CM | POA: Insufficient documentation

## 2023-02-25 DIAGNOSIS — T451X5A Adverse effect of antineoplastic and immunosuppressive drugs, initial encounter: Secondary | ICD-10-CM | POA: Diagnosis not present

## 2023-02-25 DIAGNOSIS — I10 Essential (primary) hypertension: Secondary | ICD-10-CM | POA: Insufficient documentation

## 2023-02-25 DIAGNOSIS — C7801 Secondary malignant neoplasm of right lung: Secondary | ICD-10-CM

## 2023-02-25 DIAGNOSIS — D869 Sarcoidosis, unspecified: Secondary | ICD-10-CM | POA: Diagnosis not present

## 2023-02-25 DIAGNOSIS — Z79899 Other long term (current) drug therapy: Secondary | ICD-10-CM | POA: Insufficient documentation

## 2023-02-25 DIAGNOSIS — C7951 Secondary malignant neoplasm of bone: Secondary | ICD-10-CM | POA: Diagnosis not present

## 2023-02-25 DIAGNOSIS — D6481 Anemia due to antineoplastic chemotherapy: Secondary | ICD-10-CM | POA: Diagnosis not present

## 2023-02-25 DIAGNOSIS — C221 Intrahepatic bile duct carcinoma: Secondary | ICD-10-CM | POA: Insufficient documentation

## 2023-02-25 MED ORDER — HEPARIN SOD (PORK) LOCK FLUSH 100 UNIT/ML IV SOLN
500.0000 [IU] | Freq: Once | INTRAVENOUS | Status: AC | PRN
  Administered 2023-02-25: 500 [IU]
  Filled 2023-02-25: qty 5

## 2023-02-25 MED ORDER — MAGNESIUM SULFATE 2 GM/50ML IV SOLN
2.0000 g | Freq: Once | INTRAVENOUS | Status: AC
  Administered 2023-02-25: 2 g via INTRAVENOUS
  Filled 2023-02-25: qty 50

## 2023-02-25 MED ORDER — SODIUM CHLORIDE 0.9% FLUSH
10.0000 mL | INTRAVENOUS | Status: DC | PRN
  Administered 2023-02-25: 10 mL
  Filled 2023-02-25: qty 10

## 2023-02-25 MED ORDER — PALONOSETRON HCL INJECTION 0.25 MG/5ML
0.2500 mg | Freq: Once | INTRAVENOUS | Status: AC
  Administered 2023-02-25: 0.25 mg via INTRAVENOUS
  Filled 2023-02-25: qty 5

## 2023-02-25 MED ORDER — SODIUM CHLORIDE 0.9 % IV SOLN
Freq: Once | INTRAVENOUS | Status: AC
  Filled 2023-02-25: qty 250

## 2023-02-25 MED ORDER — SODIUM CHLORIDE 0.9 % IV SOLN
25.0000 mg/m2 | Freq: Once | INTRAVENOUS | Status: AC
  Administered 2023-02-25: 58 mg via INTRAVENOUS
  Filled 2023-02-25: qty 58

## 2023-02-25 MED ORDER — FOSAPREPITANT DIMEGLUMINE INJECTION 150 MG
150.0000 mg | Freq: Once | INTRAVENOUS | Status: AC
  Administered 2023-02-25: 150 mg via INTRAVENOUS
  Filled 2023-02-25: qty 150
  Filled 2023-02-25: qty 5

## 2023-02-25 MED ORDER — SODIUM CHLORIDE 0.9 % IV SOLN
1000.0000 mg/m2 | Freq: Once | INTRAVENOUS | Status: AC
  Administered 2023-02-25: 2318 mg via INTRAVENOUS
  Filled 2023-02-25: qty 60.96

## 2023-02-25 MED ORDER — DURVALUMAB 500 MG/10ML IV SOLN
1500.0000 mg | Freq: Once | INTRAVENOUS | Status: AC
  Administered 2023-02-25: 1500 mg via INTRAVENOUS
  Filled 2023-02-25: qty 30

## 2023-02-25 MED ORDER — DEXAMETHASONE SODIUM PHOSPHATE 10 MG/ML IJ SOLN
10.0000 mg | Freq: Once | INTRAMUSCULAR | Status: AC
  Administered 2023-02-25: 10 mg via INTRAVENOUS
  Filled 2023-02-25: qty 1

## 2023-02-25 MED ORDER — POTASSIUM CHLORIDE IN NACL 20-0.9 MEQ/L-% IV SOLN
Freq: Once | INTRAVENOUS | Status: AC
  Filled 2023-02-25: qty 1000

## 2023-02-26 ENCOUNTER — Ambulatory Visit
Admission: RE | Admit: 2023-02-26 | Discharge: 2023-02-26 | Disposition: A | Discharge status: Home / Self Care | Payer: BC Managed Care – PPO | Source: Ambulatory Visit | Attending: Internal Medicine | Admitting: Internal Medicine

## 2023-02-26 DIAGNOSIS — C7802 Secondary malignant neoplasm of left lung: Secondary | ICD-10-CM | POA: Insufficient documentation

## 2023-02-26 DIAGNOSIS — C221 Intrahepatic bile duct carcinoma: Secondary | ICD-10-CM | POA: Diagnosis not present

## 2023-02-26 DIAGNOSIS — C7801 Secondary malignant neoplasm of right lung: Secondary | ICD-10-CM | POA: Insufficient documentation

## 2023-02-26 DIAGNOSIS — C7951 Secondary malignant neoplasm of bone: Secondary | ICD-10-CM | POA: Diagnosis not present

## 2023-02-26 DIAGNOSIS — R222 Localized swelling, mass and lump, trunk: Secondary | ICD-10-CM | POA: Diagnosis not present

## 2023-02-26 MED ORDER — IOHEXOL 300 MG/ML  SOLN
100.0000 mL | Freq: Once | INTRAMUSCULAR | Status: AC | PRN
  Administered 2023-02-26: 100 mL via INTRAVENOUS

## 2023-03-01 ENCOUNTER — Ambulatory Visit: Payer: BC Managed Care – PPO

## 2023-03-02 ENCOUNTER — Encounter: Payer: Self-pay | Admitting: Internal Medicine

## 2023-03-02 ENCOUNTER — Encounter: Payer: Self-pay | Admitting: *Deleted

## 2023-03-02 ENCOUNTER — Telehealth: Payer: Self-pay | Admitting: *Deleted

## 2023-03-02 ENCOUNTER — Inpatient Hospital Stay: Payer: BC Managed Care – PPO

## 2023-03-02 ENCOUNTER — Inpatient Hospital Stay (HOSPITAL_BASED_OUTPATIENT_CLINIC_OR_DEPARTMENT_OTHER): Payer: BC Managed Care – PPO | Admitting: Internal Medicine

## 2023-03-02 VITALS — BP 142/80 | HR 90 | Temp 97.0°F | Resp 18 | Wt 230.0 lb

## 2023-03-02 DIAGNOSIS — Z79899 Other long term (current) drug therapy: Secondary | ICD-10-CM | POA: Diagnosis not present

## 2023-03-02 DIAGNOSIS — C7801 Secondary malignant neoplasm of right lung: Secondary | ICD-10-CM | POA: Diagnosis not present

## 2023-03-02 DIAGNOSIS — Z5111 Encounter for antineoplastic chemotherapy: Secondary | ICD-10-CM | POA: Diagnosis not present

## 2023-03-02 DIAGNOSIS — D6481 Anemia due to antineoplastic chemotherapy: Secondary | ICD-10-CM | POA: Diagnosis not present

## 2023-03-02 DIAGNOSIS — C7800 Secondary malignant neoplasm of unspecified lung: Secondary | ICD-10-CM | POA: Diagnosis not present

## 2023-03-02 DIAGNOSIS — I10 Essential (primary) hypertension: Secondary | ICD-10-CM | POA: Diagnosis not present

## 2023-03-02 DIAGNOSIS — R7401 Elevation of levels of liver transaminase levels: Secondary | ICD-10-CM | POA: Diagnosis not present

## 2023-03-02 DIAGNOSIS — Z95828 Presence of other vascular implants and grafts: Secondary | ICD-10-CM

## 2023-03-02 DIAGNOSIS — R7989 Other specified abnormal findings of blood chemistry: Secondary | ICD-10-CM

## 2023-03-02 DIAGNOSIS — C7951 Secondary malignant neoplasm of bone: Secondary | ICD-10-CM | POA: Diagnosis not present

## 2023-03-02 DIAGNOSIS — C221 Intrahepatic bile duct carcinoma: Secondary | ICD-10-CM

## 2023-03-02 DIAGNOSIS — T451X5A Adverse effect of antineoplastic and immunosuppressive drugs, initial encounter: Secondary | ICD-10-CM | POA: Diagnosis not present

## 2023-03-02 DIAGNOSIS — D869 Sarcoidosis, unspecified: Secondary | ICD-10-CM | POA: Diagnosis not present

## 2023-03-02 DIAGNOSIS — C7802 Secondary malignant neoplasm of left lung: Secondary | ICD-10-CM

## 2023-03-02 LAB — CBC WITH DIFFERENTIAL (CANCER CENTER ONLY)
Abs Immature Granulocytes: 0.01 10*3/uL (ref 0.00–0.07)
Basophils Absolute: 0 10*3/uL (ref 0.0–0.1)
Basophils Relative: 0 %
Eosinophils Absolute: 0 10*3/uL (ref 0.0–0.5)
Eosinophils Relative: 0 %
HCT: 31.6 % — ABNORMAL LOW (ref 36.0–46.0)
Hemoglobin: 10.6 g/dL — ABNORMAL LOW (ref 12.0–15.0)
Immature Granulocytes: 0 %
Lymphocytes Relative: 24 %
Lymphs Abs: 1.4 10*3/uL (ref 0.7–4.0)
MCH: 39.4 pg — ABNORMAL HIGH (ref 26.0–34.0)
MCHC: 33.5 g/dL (ref 30.0–36.0)
MCV: 117.5 fL — ABNORMAL HIGH (ref 80.0–100.0)
Monocytes Absolute: 0.1 10*3/uL (ref 0.1–1.0)
Monocytes Relative: 1 %
Neutro Abs: 4.1 10*3/uL (ref 1.7–7.7)
Neutrophils Relative %: 75 %
Platelet Count: 142 10*3/uL — ABNORMAL LOW (ref 150–400)
RBC: 2.69 MIL/uL — ABNORMAL LOW (ref 3.87–5.11)
RDW: 15.4 % (ref 11.5–15.5)
WBC Count: 5.6 10*3/uL (ref 4.0–10.5)
nRBC: 0 % (ref 0.0–0.2)

## 2023-03-02 LAB — CMP (CANCER CENTER ONLY)
ALT: 113 U/L — ABNORMAL HIGH (ref 0–44)
AST: 61 U/L — ABNORMAL HIGH (ref 15–41)
Albumin: 4 g/dL (ref 3.5–5.0)
Alkaline Phosphatase: 94 U/L (ref 38–126)
Anion gap: 11 (ref 5–15)
BUN: 24 mg/dL — ABNORMAL HIGH (ref 6–20)
CO2: 25 mmol/L (ref 22–32)
Calcium: 9.4 mg/dL (ref 8.9–10.3)
Chloride: 100 mmol/L (ref 98–111)
Creatinine: 0.57 mg/dL (ref 0.44–1.00)
GFR, Estimated: >60 mL/min (ref >60)
Glucose, Bld: 102 mg/dL — ABNORMAL HIGH (ref 70–99)
Potassium: 3.9 mmol/L (ref 3.5–5.1)
Sodium: 136 mmol/L (ref 135–145)
Total Bilirubin: 0.8 mg/dL (ref 0.0–1.2)
Total Protein: 6.9 g/dL (ref 6.5–8.1)

## 2023-03-02 LAB — MAGNESIUM: Magnesium: 2.1 mg/dL (ref 1.7–2.4)

## 2023-03-02 MED ORDER — SODIUM CHLORIDE 0.9% FLUSH
10.0000 mL | INTRAVENOUS | Status: DC | PRN
  Administered 2023-03-02: 10 mL via INTRAVENOUS
  Filled 2023-03-02: qty 10

## 2023-03-02 MED ORDER — HEPARIN SOD (PORK) LOCK FLUSH 100 UNIT/ML IV SOLN
500.0000 [IU] | Freq: Once | INTRAVENOUS | Status: AC
  Administered 2023-03-02: 500 [IU] via INTRAVENOUS
  Filled 2023-03-02: qty 5

## 2023-03-02 MED FILL — Fosaprepitant Dimeglumine For IV Infusion 150 MG (Base Eq): INTRAVENOUS | Qty: 5 | Status: AC

## 2023-03-02 NOTE — Progress Notes (Signed)
 [facility] CONSULT NOTE  Patient Care Team: Vicki Leita DEL, MD as PCP - General (Internal Medicine) Vicki Gills, RN as Oncology Nurse Navigator Vicki Bimler, MD as Consulting Physician (Oncology)   CANCER STAGING   Cancer Staging  Cholangiocarcinoma [facility]) Staging form: Intrahepatic Bile Duct, AJCC 8th Edition - Clinical stage from 08/26/2022: Stage IV (cM1) - Signed by Vicki Bimler, MD on 09/03/2022 Stage prefix: Initial diagnosis   ASSESSMENT & PLAN:  Vicki Phillips 58 y.o. 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  # Intrahepatic Cholangiocarcinoma, stage IV - s/p liver biopsy on 08/26/2022 showed metastatic moderately differentiated adenocarcinoma.  IHC stain positive for CK7 and CK20.  TTF-1, PAX8, GATA3 and CDX2 are negative.  Differentials include cholangiocarcinoma and pancreatic carcinoma.   Plan- -patient sought second opinion with Dr. Goble at [facility] who agreed with the treatment plan.  Continue with the current plan.  Has follow-up scheduled in January 2025.  -CT imaging after 4 cycles of treatment showed stable to mildly improved disease.   -PD-L1 0%.  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 -Pretreatment CA 19-9 352 -> 130 ->164 ->122 ->159.  Will continue to trend.  - CT imaging from 02/26/2023 was reviewed.  Lung nodules are overall stable in size.  Sclerotic osseous mets in T3 is stable.  Decrease in the size of liver mass from 7.3 x 6.1 cm to 7 x 5.6 cm.  Another lesion decreased from 15 to 11 mm.  Similar to slightly decreased left periaortic lymph node.  New tiny 1 to 2 mm soft tissue nodule anterior to left lobe of the liver, nonspecific or may reflect tiny peritoneal implant.  Attention on follow-up imaging.  -Labs reviewed and acceptable for treatment.  Elevation in AST and ALT noted.  No dose adjustment needed with chemo regimen.  Repeat LFTs in  1 week.  Will proceed with cycle 8-day 8 of cisplatin  and gemcitabine  which will be the last chemo treatment.  Her CT imaging overall is looking stable to improved.  Will start her on Durvalumab  maintenance every 4 weeks.  She is scheduled to follow-up with [facility] on January 14.  # Transaminitis -AST 61, ALT 113.  Total bilirubin and ALP is normal.  Elevation in LFT is less than 3 times the upper limit of normal.  Grade 1. -Could be related to Durvalumab .  Will repeat LFTs in 1 week.  # Chemotherapy related  anemia -Iron panel consistent with anemia of chronic disease - s/p 1 U PRBC on 01/05/23. - s/p Retacrit  40,000 unit (started on 12/18/2022) x 3 doses.   # Shortness of breath -Stable.  Continue with albuterol  inhaler  # Right knee pain -MRI right knee showing horizontal tear in the posterior horn median meniscus and moderate-sized Baker's cyst with suspected leak or rupture, tricompartmental osteoarthritis.   -Seen by Vicki Phillips.  Had cortisone shot with mild improvement.  She will follow-up with them in 4 months.  # Hypertension -Continue with amlodipine  5 mg daily.   # Cough -Continue with Tussionex as needed.  # Poor appetite -Discussed about nutrition consult and appetite stimulant.  Would like to hold off  # History of sarcoidosis -Management per pulmonary -Never required treatment.  Orders Placed This Encounter  Procedures   Cancer antigen 19-9    Standing Status:   Future    Expected Date:   03/25/2023    Expiration Date:   03/24/2024   CBC  with Differential (Cancer Center Only)    Standing Status:   Future    Expected Date:   03/25/2023    Expiration Date:   03/24/2024   CMP (Cancer Center only)    Standing Status:   Future    Expected Date:   03/25/2023    Expiration Date:   03/24/2024   T4    Standing Status:   Future    Expected Date:   03/25/2023    Expiration Date:   03/24/2024   TSH    Standing Status:   Future    Expected Date:   03/25/2023    Expiration  Date:   03/24/2024   1 week CMP only In 3 weeks MD visit, labs, Durvalumab   The total time spent in the appointment was 30 minutes encounter with patients including review of chart and various tests results, discussions about plan of care and coordination of care plan   All questions were answered. The patient knows to call the clinic with any problems, questions or concerns. No barriers to learning was detected.  Vicki Rous, MD 1/7/202511:31 AM   HISTORY OF PRESENTING ILLNESS:  Vicki Phillips 58 y.o. 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  Patient reports she presented to ED with complaints of chest pain.  Had chest x-ray which showed pulmonary nodules was referred to pulmonary.  Records not available.  Was then seen by Dr. Isadora on 08/05/2022.  CT chest showed innumerable pulmonary nodules of varying sizes throughout the lung.  Largest spiculated appearing nodule of anterior right upper lobe measuring 2 x 2 cm, at least 1 ill-defined hypodense lesion of the anterior left lobe of the liver.  Despite stated history of sarcoidosis, there are no specific or characteristic features of sarcoidosis with respect to distribution, morphology, associated fibrotic change, nor associated mediastinal lymphadenopathy. 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  She has history of sarcoidosis but was never on treatment.  Reports that she does not see doctors on a regular basis because she is had bad experiences.  Interval history Patient seen today as follow-up Overall doing well.  Denies any new concerns.  The lymph node that she was feeling in her neck area has significantly improved.   I have reviewed her chart and materials related to her cancer  extensively and collaborated history with the patient. Summary of oncologic history is as follows: Oncology History  Cholangiocarcinoma (HCC)  08/26/2022 Cancer Staging   Staging form: Intrahepatic Bile Duct, AJCC 8th Edition - Clinical stage from 08/26/2022: Stage IV (cM1) - Signed by Phillips Genevia, MD on 09/03/2022 Stage prefix: Initial diagnosis   09/03/2022 Initial Diagnosis   Cholangiocarcinoma (HCC)   09/18/2022 -  Chemotherapy   Patient is on Treatment Plan : BILIARY TRACT Cisplatin  + Gemcitabine  D1,8 + Durvalumab  (1500) D1 q21d / Durvalumab  (1500) q28d     Metastasis to lung (HCC)  09/03/2022 Initial Diagnosis   Metastasis to lung (HCC)   09/18/2022 -  Chemotherapy   Patient is on Treatment Plan : BILIARY TRACT Cisplatin  + Gemcitabine  D1,8 + Durvalumab  (1500) D1 q21d / Durvalumab  (1500) q28d       MEDICAL HISTORY:  Past Medical History:  Diagnosis Date   Allergy    Bile duct cancer (HCC)    Sarcoidosis 2011  SURGICAL HISTORY: Past Surgical History:  Procedure Laterality Date   CESAREAN SECTION     x 2   COLONOSCOPY WITH PROPOFOL  N/A 08/10/2019   Procedure: COLONOSCOPY WITH PROPOFOL ;  Surgeon: Jinny Carmine, MD;  Location: [facility];  Service: Endoscopy;  Laterality: N/A;  priority 4   IR IMAGING GUIDED PORT INSERTION  09/11/2022   LUNG BIOPSY      SOCIAL HISTORY: Social History   Socioeconomic History   Marital status: Single    Spouse name: Not on file   Number of children: 2   Years of education: Not on file   Highest education level: Not on file  Occupational History   Not on file  Tobacco Use   Smoking status: Never   Smokeless tobacco: Never  Vaping Use   Vaping status: Never Used  Substance and Sexual Activity   Alcohol use: Not Currently    Alcohol/week: 4.0 standard drinks of alcohol    Types: 4 Glasses of wine per week   Drug use: No   Sexual activity: Yes    Partners: Male    Birth control/protection: Surgical  Other Topics Concern    Not on file  Social History Narrative   Not on file   Social Drivers of Health   Financial Resource Strain: Low Risk  (09/18/2022)   Overall Financial Resource Strain (CARDIA)    Difficulty of Paying Living Expenses: Not hard at all  Food Insecurity: No Food Insecurity (08/24/2022)   Hunger Vital Sign    Worried About Running Out of Food in the Last Year: Never true    Ran Out of Food in the Last Year: Never true  Transportation Needs: No Transportation Needs (08/24/2022)   PRAPARE - Administrator, Civil Service (Medical): No    Lack of Transportation (Non-Medical): No  Physical Activity: Not on file  Stress: Not on file  Social Connections: Not on file  Intimate Partner Violence: Not At Risk (08/24/2022)   Humiliation, Afraid, Rape, and Kick questionnaire    Fear of Current or Ex-Partner: No    Emotionally Abused: No    Physically Abused: No    Sexually Abused: No    FAMILY HISTORY: Family History  Problem Relation Age of Onset   Stroke Mother    Heart disease Mother    Heart attack Father 39   Heart disease Father    Kidney disease Sister     ALLERGIES:  is allergic to other.  MEDICATIONS:  Current Outpatient Medications  Medication Sig Dispense Refill   albuterol  (VENTOLIN  HFA) 108 (90 Base) MCG/ACT inhaler Inhale 2 puffs into the lungs every 6 (six) hours as needed for wheezing or shortness of breath. 8 g 0   amLODipine  (NORVASC ) 5 MG tablet Take 1 tablet (5 mg total) by mouth daily. 90 tablet 0   chlorpheniramine-HYDROcodone (TUSSIONEX) 10-8 MG/5ML Take 5 mLs by mouth at bedtime as needed for cough. 140 mL 0   dexamethasone  (DECADRON ) 4 MG tablet Take 2 tablets (8 mg) by mouth daily x 3 days starting the day after cisplatin  chemotherapy. Take with food. 30 tablet 0   loratadine (CLARITIN) 10 MG tablet Take 10 mg by mouth daily.     ondansetron  (ZOFRAN ) 8 MG tablet Take 1 tablet (8 mg total) by mouth every 8 (eight) hours as needed for nausea or vomiting.  Start on the third day after cisplatin . (Patient not taking: Reported on 11/26/2022) 30 tablet 1   prochlorperazine  (COMPAZINE ) 10 MG tablet  Take 1 tablet (10 mg total) by mouth every 6 (six) hours as needed (Nausea or vomiting). (Patient not taking: Reported on 11/05/2022) 30 tablet 1   pseudoephedrine-acetaminophen  (TYLENOL  SINUS) 30-500 MG TABS tablet Take 1 tablet by mouth in the morning and at bedtime.     No current facility-administered medications for this visit.    REVIEW OF SYSTEMS:   Pertinent information mentioned in HPI All other systems were reviewed with the patient and are negative.  PHYSICAL EXAMINATION: ECOG PERFORMANCE STATUS: 1 - Symptomatic but completely ambulatory  Vitals:   03/02/23 1035  BP: (!) 142/80  Pulse: 90  Resp: 18  Temp: (!) 97 F (36.1 C)  SpO2: 100%      Filed Weights   03/02/23 1035  Weight: 230 lb (104.3 kg)       GENERAL:alert, no distress and comfortable SKIN: skin color, texture, turgor are normal, no rashes or significant lesions EYES: normal, conjunctiva are pink and non-injected, sclera clear OROPHARYNX:no exudate, no erythema and lips, buccal mucosa, and tongue normal  NECK: supple, thyroid  normal size, non-tender, without nodularity LYMPH:  no palpable lymphadenopathy in the cervical, axillary or inguinal LUNGS: clear to auscultation and percussion with normal breathing effort HEART: regular rate & rhythm and no murmurs and no lower extremity edema ABDOMEN:abdomen soft, non-tender and normal bowel sounds Musculoskeletal:no cyanosis of digits and no clubbing  PSYCH: alert & oriented x 3 with fluent speech NEURO: no focal motor/sensory deficits  LABORATORY DATA:  I have reviewed the data as listed Lab Results  Component Value Date   WBC 5.6 03/02/2023   HGB 10.6 (L) 03/02/2023   HCT 31.6 (L) 03/02/2023   MCV 117.5 (H) 03/02/2023   PLT 142 (L) 03/02/2023   Recent Labs    02/18/23 0841 02/23/23 0943 03/02/23 1018   NA 136 139 136  K 4.0 3.6 3.9  CL 103 105 100  CO2 22 24 25   GLUCOSE 106* 89 102*  BUN 13 18 24*  CREATININE 0.62 0.70 0.57  CALCIUM  8.7* 8.7* 9.4  GFRNONAA >60 >60 >60  PROT 7.1 6.9 6.9  ALBUMIN 3.9 4.0 4.0  AST 28 27 61*  ALT 24 22 113*  ALKPHOS 142* 101 94  BILITOT 0.4 0.4 0.8    RADIOGRAPHIC STUDIES: I have personally reviewed the radiological images as listed and agreed with the findings in the report. CT CHEST ABDOMEN PELVIS W CONTRAST Result Date: 02/26/2023 CLINICAL DATA:  Cholangiocarcinoma, assess treatment response. * Tracking Code: BO * EXAM: CT CHEST, ABDOMEN, AND PELVIS WITH CONTRAST TECHNIQUE: Multidetector CT imaging of the chest, abdomen and pelvis was performed following the standard protocol during bolus administration of intravenous contrast. RADIATION DOSE REDUCTION: This exam was performed according to the departmental dose-optimization program which includes automated exposure control, adjustment of the mA and/or kV according to patient size and/or use of iterative reconstruction technique. CONTRAST:  OMNIPAQUE  IOHEXOL  300 MG/ML  SOLN COMPARISON:  Multiple priors including most recent CT November 30, 2022 FINDINGS: CT CHEST FINDINGS Cardiovascular: Right chest Port-A-Cath with tip at the superior cavoatrial junction. Normal caliber thoracic aorta. No central pulmonary embolus on this nondedicated study. Normal size heart. No significant pericardial effusion/thickening. Mediastinum/Nodes: No suspicious thyroid  nodule. No pathologically enlarged mediastinal, hilar or axillary lymph nodes. The esophagus is grossly unremarkable. Lungs/Pleura: Innumerable hematologic loosely distributed solid pulmonary nodules are not significantly changed from prior examination. No new suspicious pulmonary nodule identified. For reference: -nodule in the paramedian right lower lobe measures 18 mm on  image 96/4, unchanged. -bilobed pulmonary nodule in the posterior right lower lobe measures  2.2 cm on image 119/4, unchanged. No pleural effusion.  No pneumothorax. Musculoskeletal: Sclerotic osseous metastasis in the T3 vertebral body is similar prior examination. No new suspicious osseous lesion identified. CT ABDOMEN PELVIS FINDINGS Hepatobiliary: Dominant heterogeneous lesion in the central left lobe of the liver measures 7.0 x 5.6 cm on image 62/2 previously 7.3 x 6.1 cm. Decreased size of the hypodense heterogeneous 11 mm lesion in the inferior right lobe of the liver on image 71/2 previously 15 mm. Stable tiny hypodense 4 mm lesion in the anterior left lobe of the liver on image 56/2. No new suspicious hepatic lesion. High density material in the gallbladder again noted. Pancreas: No pancreatic ductal dilation or evidence of acute inflammation. Spleen: No splenomegaly. Adrenals/Urinary Tract: Bilateral adrenal glands appear normal. No hydronephrosis. Kidneys demonstrate symmetric enhancement. Urinary bladder is unremarkable for degree of distension. Stomach/Bowel: No radiopaque enteric contrast material was administered. Stomach is minimally distended limiting evaluation. No pathologic dilation of small or large bowel. Normal appendix. Left-sided colonic diverticulosis without findings of acute diverticulitis. Vascular/Lymphatic: Normal caliber abdominal aorta. Smooth IVC contours. The portal, splenic and superior mesenteric veins are patent. Similar to slightly decreased left periaortic lymph nodes the largest measures 7 mm in short axis on image 69/2 previously 8 mm. Reproductive: Uterus and bilateral adnexa are unremarkable. Other: New tiny 1-2 mm soft tissue nodule anterior to the left lobe of the liver on image 57/2. No significant abdominopelvic free fluid. Musculoskeletal: Multilevel degenerative change of the spine. IMPRESSION: 1. Decreased size of the dominant lesion in the central left lobe of the liver and in the 11 mm lesion in the inferior right lobe of the liver. No new suspicious  hepatic lesion identified. 2. Stable pulmonary metastasis. No new suspicious pulmonary nodule identified. 3. Sclerotic osseous metastasis in the T3 vertebral body is similar prior examination. No new suspicious osseous lesion identified. 4. New tiny 1-2 mm soft tissue nodule anterior to the left lobe of the liver, nonspecific and possibly reflecting a tiny peritoneal implant. No ascites. Suggest continued attention on follow-up imaging. 5. Stable to slightly decreased size of the left periaortic lymph nodes. 6. Left-sided colonic diverticulosis without findings of acute diverticulitis. Electronically Signed   By: Reyes Holder M.D.   On: 02/26/2023 15:23

## 2023-03-02 NOTE — Patient Instructions (Signed)

## 2023-03-02 NOTE — Progress Notes (Signed)
 Patient had a CT scan on 02/26/2023. She is doing well. No new questions or concerns for the doctor today.

## 2023-03-02 NOTE — Telephone Encounter (Signed)
 Contacted patient regarding results of elevated LFTs. Ok to treat as scheduled on Thursday 1/9 per md. Dr. Clista would like to repeat the met c on Tuesday 1/14 next week. Pt gave verbal understanding of the plan. She thanked me for calling her with the results.

## 2023-03-03 ENCOUNTER — Other Ambulatory Visit: Payer: Self-pay

## 2023-03-04 ENCOUNTER — Other Ambulatory Visit: Payer: Self-pay | Admitting: *Deleted

## 2023-03-04 ENCOUNTER — Inpatient Hospital Stay: Payer: BC Managed Care – PPO

## 2023-03-04 VITALS — BP 138/87 | HR 82 | Temp 97.0°F | Resp 16

## 2023-03-04 DIAGNOSIS — D869 Sarcoidosis, unspecified: Secondary | ICD-10-CM | POA: Diagnosis not present

## 2023-03-04 DIAGNOSIS — C7951 Secondary malignant neoplasm of bone: Secondary | ICD-10-CM | POA: Diagnosis not present

## 2023-03-04 DIAGNOSIS — C7800 Secondary malignant neoplasm of unspecified lung: Secondary | ICD-10-CM | POA: Diagnosis not present

## 2023-03-04 DIAGNOSIS — I10 Essential (primary) hypertension: Secondary | ICD-10-CM | POA: Diagnosis not present

## 2023-03-04 DIAGNOSIS — Z5111 Encounter for antineoplastic chemotherapy: Secondary | ICD-10-CM | POA: Diagnosis not present

## 2023-03-04 DIAGNOSIS — C221 Intrahepatic bile duct carcinoma: Secondary | ICD-10-CM | POA: Diagnosis not present

## 2023-03-04 DIAGNOSIS — C7801 Secondary malignant neoplasm of right lung: Secondary | ICD-10-CM

## 2023-03-04 DIAGNOSIS — T451X5A Adverse effect of antineoplastic and immunosuppressive drugs, initial encounter: Secondary | ICD-10-CM | POA: Diagnosis not present

## 2023-03-04 DIAGNOSIS — D6481 Anemia due to antineoplastic chemotherapy: Secondary | ICD-10-CM | POA: Diagnosis not present

## 2023-03-04 DIAGNOSIS — Z79899 Other long term (current) drug therapy: Secondary | ICD-10-CM | POA: Diagnosis not present

## 2023-03-04 MED ORDER — SODIUM CHLORIDE 0.9 % IV SOLN
25.0000 mg/m2 | Freq: Once | INTRAVENOUS | Status: AC
  Administered 2023-03-04: 58 mg via INTRAVENOUS
  Filled 2023-03-04: qty 58

## 2023-03-04 MED ORDER — POTASSIUM CHLORIDE IN NACL 20-0.9 MEQ/L-% IV SOLN
Freq: Once | INTRAVENOUS | Status: AC
  Filled 2023-03-04: qty 1000

## 2023-03-04 MED ORDER — SODIUM CHLORIDE 0.9 % IV SOLN
150.0000 mg | Freq: Once | INTRAVENOUS | Status: AC
  Administered 2023-03-04: 150 mg via INTRAVENOUS
  Filled 2023-03-04: qty 150

## 2023-03-04 MED ORDER — HEPARIN SOD (PORK) LOCK FLUSH 100 UNIT/ML IV SOLN
500.0000 [IU] | Freq: Once | INTRAVENOUS | Status: AC | PRN
Start: 2023-03-04 — End: 2023-03-04
  Administered 2023-03-04: 500 [IU]
  Filled 2023-03-04: qty 5

## 2023-03-04 MED ORDER — MAGNESIUM SULFATE 2 GM/50ML IV SOLN
2.0000 g | Freq: Once | INTRAVENOUS | Status: AC
  Administered 2023-03-04: 2 g via INTRAVENOUS
  Filled 2023-03-04: qty 50

## 2023-03-04 MED ORDER — SODIUM CHLORIDE 0.9 % IV SOLN
Freq: Once | INTRAVENOUS | Status: AC
  Filled 2023-03-04: qty 250

## 2023-03-04 MED ORDER — AMLODIPINE BESYLATE 5 MG PO TABS: 5.0000 mg | ORAL_TABLET | Freq: Every day | ORAL | 1 refills | Status: DC

## 2023-03-04 MED ORDER — PALONOSETRON HCL INJECTION 0.25 MG/5ML
0.2500 mg | Freq: Once | INTRAVENOUS | Status: AC
  Administered 2023-03-04: 0.25 mg via INTRAVENOUS
  Filled 2023-03-04: qty 5

## 2023-03-04 MED ORDER — DEXAMETHASONE SODIUM PHOSPHATE 10 MG/ML IJ SOLN
10.0000 mg | Freq: Once | INTRAMUSCULAR | Status: AC
  Administered 2023-03-04: 10 mg via INTRAVENOUS
  Filled 2023-03-04: qty 1

## 2023-03-04 MED ORDER — GEMCITABINE HCL CHEMO INJECTION 1 GM/26.3ML
1000.0000 mg/m2 | Freq: Once | INTRAVENOUS | Status: AC
  Administered 2023-03-04: 2318 mg via INTRAVENOUS
  Filled 2023-03-04: qty 60.96

## 2023-03-04 NOTE — Telephone Encounter (Signed)
 Pharmacy faxed request for amlodipine RF.

## 2023-03-04 NOTE — Progress Notes (Signed)
 Okay to run hydration fluid with cisplatin per Dr Alena Bills.

## 2023-03-04 NOTE — Patient Instructions (Signed)
 [facility]  Discharge Instructions: Thank you for choosing [facility] to provide your oncology and hematology care.  If you have a lab appointment with the Cancer Center, please go directly to the Cancer Center and check in at the registration area.  Wear comfortable clothing and clothing appropriate for easy access to any Portacath or PICC line.   We strive to give you quality time with your provider. You may need to reschedule your appointment if you arrive late (15 or more minutes).  Arriving late affects you and other patients whose appointments are after yours.  Also, if you miss three or more appointments without notifying the office, you may be dismissed from the clinic at the provider's discretion.      For prescription refill requests, have your pharmacy contact our office and allow 72 hours for refills to be completed.    Today you received the following chemotherapy and/or immunotherapy agents GEMZAR and CISPLATIN      To help prevent nausea and vomiting after your treatment, we encourage you to take your nausea medication as directed.  BELOW ARE SYMPTOMS THAT SHOULD BE REPORTED IMMEDIATELY: *FEVER GREATER THAN 100.4 F (38 C) OR HIGHER *CHILLS OR SWEATING *NAUSEA AND VOMITING THAT IS NOT CONTROLLED WITH YOUR NAUSEA MEDICATION *UNUSUAL SHORTNESS OF BREATH *UNUSUAL BRUISING OR BLEEDING *URINARY PROBLEMS (pain or burning when urinating, or frequent urination) *BOWEL PROBLEMS (unusual diarrhea, constipation, pain near the anus) TENDERNESS IN MOUTH AND THROAT WITH OR WITHOUT PRESENCE OF ULCERS (sore throat, sores in mouth, or a toothache) UNUSUAL RASH, SWELLING OR PAIN  UNUSUAL VAGINAL DISCHARGE OR ITCHING   Items with * indicate a potential emergency and should be followed up as soon as possible or go to the Emergency Department if any problems should occur.  Please show the CHEMOTHERAPY ALERT CARD or  IMMUNOTHERAPY ALERT CARD at check-in to the Emergency Department and triage nurse.  Should you have questions after your visit or need to cancel or reschedule your appointment, please contact [facility] - A DEPT OF [facility]  [phone] and follow the prompts.  Office hours are 8:00 a.m. to 4:30 p.m. Monday - Friday. Please note that voicemails left after 4:00 p.m. may not be returned until the following business day.  We are closed weekends and major holidays. You have access to a nurse at all times for urgent questions. Please call the main number to the clinic [phone] and follow the prompts.  For any non-urgent questions, you may also contact your provider using MyChart. We now offer e-Visits for anyone 106 and older to request care online for non-urgent symptoms. For details visit [email or web address].   Also download the MyChart app! Go to the app store, search "MyChart", open the app, select [facility], and log in with your MyChart username and password.  Gemcitabine Injection What is this medication? GEMCITABINE (jem SYE ta been) treats some types of cancer. It works by slowing down the growth of cancer cells. This medicine may be used for other purposes; ask your health care provider or pharmacist if you have questions. COMMON BRAND NAME(S): Gemzar, Infugem What should I tell my care team before I take this medication? They need to know if you have any of these conditions: Blood disorders Infection Kidney disease Liver disease Lung or breathing disease, such as asthma or COPD Recent or ongoing radiation therapy An unusual  or allergic reaction to gemcitabine, other medications, foods, dyes, or preservatives If you or your partner are pregnant or trying to get pregnant Breast-feeding How should I use this medication? This medication is injected into a vein. It is given by your care team in a hospital or clinic setting. Talk to your care  team about the use of this medication in children. Special care may be needed. Overdosage: If you think you have taken too much of this medicine contact a poison control center or emergency room at once. NOTE: This medicine is only for you. Do not share this medicine with others. What if I miss a dose? Keep appointments for follow-up doses. It is important not to miss your dose. Call your care team if you are unable to keep an appointment. What may interact with this medication? Interactions have not been studied. This list may not describe all possible interactions. Give your health care provider a list of all the medicines, herbs, non-prescription drugs, or dietary supplements you use. Also tell them if you smoke, drink alcohol, or use illegal drugs. Some items may interact with your medicine. What should I watch for while using this medication? Your condition will be monitored carefully while you are receiving this medication. This medication may make you feel generally unwell. This is not uncommon, as chemotherapy can affect healthy cells as well as cancer cells. Report any side effects. Continue your course of treatment even though you feel ill unless your care team tells you to stop. In some cases, you may be given additional medications to help with side effects. Follow all directions for their use. This medication may increase your risk of getting an infection. Call your care team for advice if you get a fever, chills, sore throat, or other symptoms of a cold or flu. Do not treat yourself. Try to avoid being around people who are sick. This medication may increase your risk to bruise or bleed. Call your care team if you notice any unusual bleeding. Be careful brushing or flossing your teeth or using a toothpick because you may get an infection or bleed more easily. If you have any dental work done, tell your dentist you are receiving this medication. Avoid taking medications that contain  aspirin, acetaminophen, ibuprofen, naproxen, or ketoprofen unless instructed by your care team. These medications may hide a fever. Talk to your care team if you or your partner wish to become pregnant or think you might be pregnant. This medication can cause serious birth defects if taken during pregnancy and for 6 months after the last dose. A negative pregnancy test is required before starting this medication. A reliable form of contraception is recommended while taking this medication and for 6 months after the last dose. Talk to your care team about effective forms of contraception. Do not father a child while taking this medication and for 3 months after the last dose. Use a condom while having sex during this time period. Do not breastfeed while taking this medication and for at least 1 week after the last dose. This medication may cause infertility. Talk to your care team if you are concerned about your fertility. What side effects may I notice from receiving this medication? Side effects that you should report to your care team as soon as possible: Allergic reactions--skin rash, itching, hives, swelling of the face, lips, tongue, or throat Capillary leak syndrome--stomach or muscle pain, unusual weakness or fatigue, feeling faint or lightheaded, decrease in the amount of urine,  swelling of the ankles, hands, or feet, trouble breathing Infection--fever, chills, cough, sore throat, wounds that don't heal, pain or trouble when passing urine, general feeling of discomfort or being unwell Liver injury--right upper belly pain, loss of appetite, nausea, light-colored stool, dark yellow or brown urine, yellowing skin or eyes, unusual weakness or fatigue Low red blood cell level--unusual weakness or fatigue, dizziness, headache, trouble breathing Lung injury--shortness of breath or trouble breathing, cough, spitting up blood, chest pain, fever Stomach pain, bloody diarrhea, pale skin, unusual weakness or  fatigue, decrease in the amount of urine, which may be signs of hemolytic uremic syndrome Sudden and severe headache, confusion, change in vision, seizures, which may be signs of posterior reversible encephalopathy syndrome (PRES) Unusual bruising or bleeding Side effects that usually do not require medical attention (report to your care team if they continue or are bothersome): Diarrhea Drowsiness Hair loss Nausea Pain, redness, or swelling with sores inside the mouth or throat Vomiting This list may not describe all possible side effects. Call your doctor for medical advice about side effects. You may report side effects to FDA at 1-800-FDA-1088. Where should I keep my medication? This medication is given in a hospital or clinic. It will not be stored at home. NOTE: This sheet is a summary. It may not cover all possible information. If you have questions about this medicine, talk to your doctor, pharmacist, or health care provider.  2024 Elsevier/Gold Standard (2021-06-17 00:00:00)  Cisplatin Injection What is this medication? CISPLATIN (SIS pla tin) treats some types of cancer. It works by slowing down the growth of cancer cells. This medicine may be used for other purposes; ask your health care provider or pharmacist if you have questions. COMMON BRAND NAME(S): Platinol, Platinol -AQ What should I tell my care team before I take this medication? They need to know if you have any of these conditions: Eye disease, vision problems Hearing problems Kidney disease Low blood counts, such as low white cells, platelets, or red blood cells Tingling of the fingers or toes, or other nerve disorder An unusual or allergic reaction to cisplatin, carboplatin, oxaliplatin, other medications, foods, dyes, or preservatives If you or your partner are pregnant or trying to get pregnant Breast-feeding How should I use this medication? This medication is injected into a vein. It is given by your care  team in a hospital or clinic setting. Talk to your care team about the use of this medication in children. Special care may be needed. Overdosage: If you think you have taken too much of this medicine contact a poison control center or emergency room at once. NOTE: This medicine is only for you. Do not share this medicine with others. What if I miss a dose? Keep appointments for follow-up doses. It is important not to miss your dose. Call your care team if you are unable to keep an appointment. What may interact with this medication? Do not take this medication with any of the following: Live virus vaccines This medication may also interact with the following: Certain antibiotics, such as amikacin, gentamicin, neomycin, polymyxin B, streptomycin, tobramycin, vancomycin Foscarnet This list may not describe all possible interactions. Give your health care provider a list of all the medicines, herbs, non-prescription drugs, or dietary supplements you use. Also tell them if you smoke, drink alcohol, or use illegal drugs. Some items may interact with your medicine. What should I watch for while using this medication? Your condition will be monitored carefully while you are receiving  this medication. You may need blood work done while taking this medication. This medication may make you feel generally unwell. This is not uncommon, as chemotherapy can affect healthy cells as well as cancer cells. Report any side effects. Continue your course of treatment even though you feel ill unless your care team tells you to stop. This medication may increase your risk of getting an infection. Call your care team for advice if you get a fever, chills, sore throat, or other symptoms of a cold or flu. Do not treat yourself. Try to avoid being around people who are sick. Avoid taking medications that contain aspirin, acetaminophen, ibuprofen, naproxen, or ketoprofen unless instructed by your care team. These medications  may hide a fever. This medication may increase your risk to bruise or bleed. Call your care team if you notice any unusual bleeding. Be careful brushing or flossing your teeth or using a toothpick because you may get an infection or bleed more easily. If you have any dental work done, tell your dentist you are receiving this medication. Drink fluids as directed while you are taking this medication. This will help protect your kidneys. Call your care team if you get diarrhea. Do not treat yourself. Talk to your care team if you or your partner wish to become pregnant or think you might be pregnant. This medication can cause serious birth defects if taken during pregnancy and for 14 months after the last dose. A negative pregnancy test is required before starting this medication. A reliable form of contraception is recommended while taking this medication and for 14 months after the last dose. Talk to your care team about effective forms of contraception. Do not father a child while taking this medication and for 11 months after the last dose. Use a condom during sex during this time period. Do not breast-feed while taking this medication. This medication may cause infertility. Talk to your care team if you are concerned about your fertility. What side effects may I notice from receiving this medication? Side effects that you should report to your care team as soon as possible: Allergic reactions--skin rash, itching, hives, swelling of the face, lips, tongue, or throat Eye pain, change in vision, vision loss Hearing loss, ringing in ears Infection--fever, chills, cough, sore throat, wounds that don't heal, pain or trouble when passing urine, general feeling of discomfort or being unwell Kidney injury--decrease in the amount of urine, swelling of the ankles, hands, or feet Low red blood cell level--unusual weakness or fatigue, dizziness, headache, trouble breathing Painful swelling, warmth, or redness  of the skin, blisters or sores at the infusion site Pain, tingling, or numbness in the hands or feet Unusual bruising or bleeding Side effects that usually do not require medical attention (report to your care team if they continue or are bothersome): Hair loss Nausea Vomiting This list may not describe all possible side effects. Call your doctor for medical advice about side effects. You may report side effects to FDA at 1-800-FDA-1088. Where should I keep my medication? This medication is given in a hospital or clinic. It will not be stored at home. NOTE: This sheet is a summary. It may not cover all possible information. If you have questions about this medicine, talk to your doctor, pharmacist, or health care provider.  2024 Elsevier/Gold Standard (2021-06-13 00:00:00)

## 2023-03-08 ENCOUNTER — Inpatient Hospital Stay: Payer: BC Managed Care – PPO

## 2023-03-08 DIAGNOSIS — Z79899 Other long term (current) drug therapy: Secondary | ICD-10-CM | POA: Diagnosis not present

## 2023-03-08 DIAGNOSIS — C7800 Secondary malignant neoplasm of unspecified lung: Secondary | ICD-10-CM | POA: Diagnosis not present

## 2023-03-08 DIAGNOSIS — I10 Essential (primary) hypertension: Secondary | ICD-10-CM | POA: Diagnosis not present

## 2023-03-08 DIAGNOSIS — C221 Intrahepatic bile duct carcinoma: Secondary | ICD-10-CM

## 2023-03-08 DIAGNOSIS — D6481 Anemia due to antineoplastic chemotherapy: Secondary | ICD-10-CM | POA: Diagnosis not present

## 2023-03-08 DIAGNOSIS — D869 Sarcoidosis, unspecified: Secondary | ICD-10-CM | POA: Diagnosis not present

## 2023-03-08 DIAGNOSIS — C7801 Secondary malignant neoplasm of right lung: Secondary | ICD-10-CM

## 2023-03-08 DIAGNOSIS — C7951 Secondary malignant neoplasm of bone: Secondary | ICD-10-CM | POA: Diagnosis not present

## 2023-03-08 DIAGNOSIS — R7989 Other specified abnormal findings of blood chemistry: Secondary | ICD-10-CM

## 2023-03-08 DIAGNOSIS — T451X5A Adverse effect of antineoplastic and immunosuppressive drugs, initial encounter: Secondary | ICD-10-CM | POA: Diagnosis not present

## 2023-03-08 DIAGNOSIS — Z5111 Encounter for antineoplastic chemotherapy: Secondary | ICD-10-CM | POA: Diagnosis not present

## 2023-03-08 LAB — CMP (CANCER CENTER ONLY)
ALT: 54 U/L — ABNORMAL HIGH (ref 0–44)
AST: 34 U/L (ref 15–41)
Albumin: 4.1 g/dL (ref 3.5–5.0)
Alkaline Phosphatase: 87 U/L (ref 38–126)
Anion gap: 11 (ref 5–15)
BUN: 23 mg/dL — ABNORMAL HIGH (ref 6–20)
CO2: 26 mmol/L (ref 22–32)
Calcium: 9.5 mg/dL (ref 8.9–10.3)
Chloride: 98 mmol/L (ref 98–111)
Creatinine: 0.74 mg/dL (ref 0.44–1.00)
GFR, Estimated: >60 mL/min (ref >60)
Glucose, Bld: 104 mg/dL — ABNORMAL HIGH (ref 70–99)
Potassium: 4.1 mmol/L (ref 3.5–5.1)
Sodium: 135 mmol/L (ref 135–145)
Total Bilirubin: 0.8 mg/dL (ref 0.0–1.2)
Total Protein: 6.8 g/dL (ref 6.5–8.1)

## 2023-03-08 MED ORDER — PEGFILGRASTIM-JMDB 6 MG/0.6ML ~~LOC~~ SOSY
6.0000 mg | PREFILLED_SYRINGE | Freq: Once | SUBCUTANEOUS | Status: AC
Start: 2023-03-08 — End: 2023-03-08
  Administered 2023-03-08: 6 mg via SUBCUTANEOUS
  Filled 2023-03-08: qty 0.6

## 2023-03-09 ENCOUNTER — Other Ambulatory Visit: Payer: BC Managed Care – PPO

## 2023-03-16 DIAGNOSIS — T50905A Adverse effect of unspecified drugs, medicaments and biological substances, initial encounter: Secondary | ICD-10-CM | POA: Diagnosis not present

## 2023-03-16 DIAGNOSIS — L2989 Other pruritus: Secondary | ICD-10-CM | POA: Diagnosis not present

## 2023-03-16 DIAGNOSIS — C221 Intrahepatic bile duct carcinoma: Secondary | ICD-10-CM | POA: Diagnosis not present

## 2023-03-17 ENCOUNTER — Encounter: Payer: Self-pay | Admitting: Internal Medicine

## 2023-03-18 ENCOUNTER — Encounter: Payer: Self-pay | Admitting: Internal Medicine

## 2023-03-19 ENCOUNTER — Other Ambulatory Visit: Payer: Self-pay

## 2023-03-20 ENCOUNTER — Other Ambulatory Visit: Payer: Self-pay

## 2023-03-25 ENCOUNTER — Ambulatory Visit: Payer: BC Managed Care – PPO | Admitting: Internal Medicine

## 2023-03-25 ENCOUNTER — Ambulatory Visit: Payer: BC Managed Care – PPO

## 2023-03-25 ENCOUNTER — Other Ambulatory Visit: Payer: BC Managed Care – PPO

## 2023-03-26 ENCOUNTER — Encounter: Payer: Self-pay | Admitting: Internal Medicine

## 2023-03-26 ENCOUNTER — Other Ambulatory Visit: Payer: Self-pay

## 2023-03-26 ENCOUNTER — Inpatient Hospital Stay: Payer: BC Managed Care – PPO

## 2023-03-26 ENCOUNTER — Inpatient Hospital Stay (HOSPITAL_BASED_OUTPATIENT_CLINIC_OR_DEPARTMENT_OTHER): Payer: BC Managed Care – PPO | Admitting: Internal Medicine

## 2023-03-26 VITALS — BP 142/90 | HR 95 | Temp 97.6°F | Resp 20 | Wt 241.0 lb

## 2023-03-26 VITALS — BP 140/95 | HR 95 | Temp 97.5°F | Resp 19

## 2023-03-26 DIAGNOSIS — D869 Sarcoidosis, unspecified: Secondary | ICD-10-CM | POA: Diagnosis not present

## 2023-03-26 DIAGNOSIS — C7801 Secondary malignant neoplasm of right lung: Secondary | ICD-10-CM

## 2023-03-26 DIAGNOSIS — C221 Intrahepatic bile duct carcinoma: Secondary | ICD-10-CM

## 2023-03-26 DIAGNOSIS — C7951 Secondary malignant neoplasm of bone: Secondary | ICD-10-CM | POA: Diagnosis not present

## 2023-03-26 DIAGNOSIS — D6481 Anemia due to antineoplastic chemotherapy: Secondary | ICD-10-CM | POA: Diagnosis not present

## 2023-03-26 DIAGNOSIS — Z5111 Encounter for antineoplastic chemotherapy: Secondary | ICD-10-CM | POA: Diagnosis not present

## 2023-03-26 DIAGNOSIS — C7802 Secondary malignant neoplasm of left lung: Secondary | ICD-10-CM

## 2023-03-26 DIAGNOSIS — T451X5A Adverse effect of antineoplastic and immunosuppressive drugs, initial encounter: Secondary | ICD-10-CM | POA: Diagnosis not present

## 2023-03-26 DIAGNOSIS — Z5112 Encounter for antineoplastic immunotherapy: Secondary | ICD-10-CM | POA: Diagnosis not present

## 2023-03-26 DIAGNOSIS — I10 Essential (primary) hypertension: Secondary | ICD-10-CM | POA: Diagnosis not present

## 2023-03-26 DIAGNOSIS — C7800 Secondary malignant neoplasm of unspecified lung: Secondary | ICD-10-CM | POA: Diagnosis not present

## 2023-03-26 DIAGNOSIS — Z79899 Other long term (current) drug therapy: Secondary | ICD-10-CM | POA: Diagnosis not present

## 2023-03-26 LAB — CBC WITH DIFFERENTIAL (CANCER CENTER ONLY)
Abs Immature Granulocytes: 0.04 10*3/uL (ref 0.00–0.07)
Basophils Absolute: 0 10*3/uL (ref 0.0–0.1)
Basophils Relative: 0 %
Eosinophils Absolute: 0.1 10*3/uL (ref 0.0–0.5)
Eosinophils Relative: 1 %
HCT: 29.1 % — ABNORMAL LOW (ref 36.0–46.0)
Hemoglobin: 9.4 g/dL — ABNORMAL LOW (ref 12.0–15.0)
Immature Granulocytes: 1 %
Lymphocytes Relative: 11 %
Lymphs Abs: 0.6 10*3/uL — ABNORMAL LOW (ref 0.7–4.0)
MCH: 38.7 pg — ABNORMAL HIGH (ref 26.0–34.0)
MCHC: 32.3 g/dL (ref 30.0–36.0)
MCV: 119.8 fL — ABNORMAL HIGH (ref 80.0–100.0)
Monocytes Absolute: 0.6 10*3/uL (ref 0.1–1.0)
Monocytes Relative: 11 %
Neutro Abs: 3.7 10*3/uL (ref 1.7–7.7)
Neutrophils Relative %: 76 %
Platelet Count: 204 10*3/uL (ref 150–400)
RBC: 2.43 MIL/uL — ABNORMAL LOW (ref 3.87–5.11)
RDW: 16.9 % — ABNORMAL HIGH (ref 11.5–15.5)
WBC Count: 4.9 10*3/uL (ref 4.0–10.5)
nRBC: 0 % (ref 0.0–0.2)

## 2023-03-26 LAB — CMP (CANCER CENTER ONLY)
ALT: 25 U/L (ref 0–44)
AST: 32 U/L (ref 15–41)
Albumin: 3.6 g/dL (ref 3.5–5.0)
Alkaline Phosphatase: 110 U/L (ref 38–126)
Anion gap: 7 (ref 5–15)
BUN: 13 mg/dL (ref 6–20)
CO2: 23 mmol/L (ref 22–32)
Calcium: 8.8 mg/dL — ABNORMAL LOW (ref 8.9–10.3)
Chloride: 104 mmol/L (ref 98–111)
Creatinine: 0.58 mg/dL (ref 0.44–1.00)
GFR, Estimated: >60 mL/min (ref >60)
Glucose, Bld: 103 mg/dL — ABNORMAL HIGH (ref 70–99)
Potassium: 3.8 mmol/L (ref 3.5–5.1)
Sodium: 134 mmol/L — ABNORMAL LOW (ref 135–145)
Total Bilirubin: 0.6 mg/dL (ref 0.0–1.2)
Total Protein: 6.8 g/dL (ref 6.5–8.1)

## 2023-03-26 LAB — FERRITIN: Ferritin: 417 ng/mL — ABNORMAL HIGH (ref 11–307)

## 2023-03-26 LAB — IRON AND TIBC
Iron: 63 ug/dL (ref 28–170)
Saturation Ratios: 17 % (ref 10.4–31.8)
TIBC: 371 ug/dL (ref 250–450)
UIBC: 308 ug/dL

## 2023-03-26 LAB — TSH: TSH: 2.323 u[IU]/mL (ref 0.350–4.500)

## 2023-03-26 MED ORDER — SODIUM CHLORIDE 0.9 % IV SOLN
Freq: Once | INTRAVENOUS | Status: AC
  Filled 2023-03-26: qty 250

## 2023-03-26 MED ORDER — SODIUM CHLORIDE 0.9 % IV SOLN
1500.0000 mg | Freq: Once | INTRAVENOUS | Status: AC
  Administered 2023-03-26: 1500 mg via INTRAVENOUS
  Filled 2023-03-26: qty 30

## 2023-03-26 MED ORDER — HEPARIN SOD (PORK) LOCK FLUSH 100 UNIT/ML IV SOLN
500.0000 [IU] | Freq: Once | INTRAVENOUS | Status: AC | PRN
  Administered 2023-03-26: 500 [IU]
  Filled 2023-03-26: qty 5

## 2023-03-26 NOTE — Progress Notes (Signed)
Patient feels like she might have cushings disease, her face has been really swollen. She is always craving something to drink especially cranberry juice. Her breathing and cough have got worse, she can't breath good, but she does have a rescue inhaler.

## 2023-03-26 NOTE — Patient Instructions (Signed)
 [facility]  Discharge Instructions: Thank you for choosing [facility] to provide your oncology and hematology care.  If you have a lab appointment with the Cancer Center, please go directly to the Cancer Center and check in at the registration area.  Wear comfortable clothing and clothing appropriate for easy access to any Portacath or PICC line.   We strive to give you quality time with your provider. You may need to reschedule your appointment if you arrive late (15 or more minutes).  Arriving late affects you and other patients whose appointments are after yours.  Also, if you miss three or more appointments without notifying the office, you may be dismissed from the clinic at the provider's discretion.      For prescription refill requests, have your pharmacy contact our office and allow 72 hours for refills to be completed.    Today you received the following chemotherapy and/or immunotherapy agents- imfinzi      To help prevent nausea and vomiting after your treatment, we encourage you to take your nausea medication as directed.  BELOW ARE SYMPTOMS THAT SHOULD BE REPORTED IMMEDIATELY: *FEVER GREATER THAN 100.4 F (38 C) OR HIGHER *CHILLS OR SWEATING *NAUSEA AND VOMITING THAT IS NOT CONTROLLED WITH YOUR NAUSEA MEDICATION *UNUSUAL SHORTNESS OF BREATH *UNUSUAL BRUISING OR BLEEDING *URINARY PROBLEMS (pain or burning when urinating, or frequent urination) *BOWEL PROBLEMS (unusual diarrhea, constipation, pain near the anus) TENDERNESS IN MOUTH AND THROAT WITH OR WITHOUT PRESENCE OF ULCERS (sore throat, sores in mouth, or a toothache) UNUSUAL RASH, SWELLING OR PAIN  UNUSUAL VAGINAL DISCHARGE OR ITCHING   Items with * indicate a potential emergency and should be followed up as soon as possible or go to the Emergency Department if any problems should occur.  Please show the CHEMOTHERAPY ALERT CARD or IMMUNOTHERAPY  ALERT CARD at check-in to the Emergency Department and triage nurse.  Should you have questions after your visit or need to cancel or reschedule your appointment, please contact [facility] - A DEPT OF [facility]  [phone] and follow the prompts.  Office hours are 8:00 a.m. to 4:30 p.m. Monday - Friday. Please note that voicemails left after 4:00 p.m. may not be returned until the following business day.  We are closed weekends and major holidays. You have access to a nurse at all times for urgent questions. Please call the main number to the clinic [phone] and follow the prompts.  For any non-urgent questions, you may also contact your provider using MyChart. We now offer e-Visits for anyone 49 and older to request care online for non-urgent symptoms. For details visit [email or web address].   Also download the MyChart app! Go to the app store, search "MyChart", open the app, select [place], and log in with your MyChart username and password.

## 2023-03-26 NOTE — Progress Notes (Signed)
[facility] CONSULT NOTE  Patient Care Team: Reubin Milan, MD as PCP - General (Internal Medicine) Glory Buff, RN as Oncology Nurse Navigator Michaelyn Barter, MD as Consulting Physician (Oncology)   CANCER STAGING   Cancer Staging  Cholangiocarcinoma [facility]) Staging form: Intrahepatic Bile Duct, AJCC 8th Edition - Clinical stage from 08/26/2022: Stage IV (cM1) - Signed by Michaelyn Barter, MD on 09/03/2022 Stage prefix: Initial diagnosis   Treatment Cisplatin 25 mg/m/gemcitabine 1000 mg/m/Durvalumab -completed 8 cycles on 03/04/2023 Maintenance Durvalumab/gemcitabine-started 03/26/2023  ASSESSMENT & PLAN:  Vicki Phillips 58 y.o. 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  # Intrahepatic Cholangiocarcinoma, stage IV - s/p liver biopsy on 08/26/2022 showed metastatic moderately differentiated adenocarcinoma.  IHC stain positive for CK7 and CK20.  TTF-1, PAX8, GATA3 and CDX2 are negative.  Differentials include cholangiocarcinoma and pancreatic carcinoma.   Plan- -PD-L1 0%.  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 -s/p cisplatin, gemcitabine and Durvalumab every 3 weeks for 8 cycles on 03/04/2023. -Care coordination with Dr. Hulen Luster at [facility].  Recommending extrapolation of data from keynote 966 and consideration of durvalumab/gemcitabine maintenance. -Labs reviewed and acceptable for treatment.  Will proceed with Durvalumab today.  I have added gemcitabine 1000 mg/m for day 8 and day 15 for cycle 9.  With cycle 10, will do D1 Durva/gem and D8 gemcitabine every 4-week (2 weeks on 2 weeks off cycle) -Continue CA 19-9 monitoring every 4 weeks. -Repeat CT imaging after 3 cycles.  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 -Improving since off steroid.  Will monitor for now.  # Transaminitis -Resolved.  # Chemotherapy related  anemia -Iron  panel consistent with anemia of chronic disease - s/p 1 U PRBC on 01/05/23. - s/p Retacrit 40,000 unit (started on 12/18/2022) x 3 doses.   # Shortness of breath -Stable.  Continue with albuterol inhaler  # Right knee pain -MRI right knee showing horizontal tear in the posterior horn median meniscus and moderate-sized Baker's cyst with suspected leak or rupture, tricompartmental osteoarthritis.   -Seen by Raechel Chute.  Had cortisone shot with mild improvement.    # Hypertension -Continue with amlodipine 5 mg daily.   # Cough -Continue with Tussionex as needed.  # Poor appetite -Discussed about nutrition consult and appetite stimulant.  Would like to hold off  # History of sarcoidosis -Management per pulmonary -Never required treatment.  Orders Placed This Encounter  Procedures   CBC with Differential (Cancer Center Only)    Standing Status:   Future    Expected Date:   04/02/2023    Expiration Date:   04/01/2024   CMP (Cancer Center only)    Standing Status:   Future    Expected Date:   04/02/2023    Expiration Date:   04/01/2024   CBC with Differential (Cancer Center Only)    Standing Status:   Future    Expected Date:   04/09/2023    Expiration Date:   04/08/2024   CMP (Cancer Center only)    Standing Status:   Future    Expected Date:   04/09/2023    Expiration Date:   04/08/2024   Day 8, day 15 gemcitabine  The total time spent in the appointment was 30 minutes encounter with patients including review of chart and various tests results, discussions about plan of care and coordination of care plan   All questions were answered. The patient knows to  call the clinic with any problems, questions or concerns. No barriers to learning was detected.  Michaelyn Barter, MD 1/31/202511:25 AM   HISTORY OF PRESENTING ILLNESS:  Vicki Phillips 58 y.o. female with pmh of sarcoidosis not on any treatment was referred to oncology for multiple pulmonary nodules and liver  lesion.  Patient reports she presented to ED with complaints of chest pain.  Had chest x-ray which showed pulmonary nodules was referred to pulmonary.  Records not available.  Was then seen by Dr. Aundria Rud on 08/05/2022.  CT chest showed innumerable pulmonary nodules of varying sizes throughout the lung.  Largest spiculated appearing nodule of anterior right upper lobe measuring 2 x 2 cm, at least 1 ill-defined hypodense lesion of the anterior left lobe of the liver.  Despite stated history of sarcoidosis, there are no specific or characteristic features of sarcoidosis with respect to distribution, morphology, associated fibrotic change, nor associated mediastinal lymphadenopathy. 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  She has history of sarcoidosis but was never on treatment.  Reports that she does not see doctors on a regular basis because she is had bad experiences.  Interval history Patient seen today as follow-up now on maintenance gemcitabine and Durvalumab combination. Reports she has been feeling more tired since of the steroid.  Was also concerned about having cushingoid features.  Her cough has worsened continues to use Tussionex as needed.  Nausea is manageable with antiemetics.  Shortness of breath on exertion has been fluctuating for several months.  Discussed about monitoring.   I have reviewed her chart and materials related to her cancer extensively and collaborated history with the patient. Summary of oncologic history is as follows: Oncology History  Cholangiocarcinoma (HCC)  08/26/2022 Cancer Staging   Staging form: Intrahepatic Bile Duct, AJCC 8th Edition - Clinical stage from 08/26/2022: Stage IV (cM1) - Signed by Michaelyn Barter, MD on 09/03/2022 Stage prefix: Initial diagnosis   09/03/2022 Initial Diagnosis    Cholangiocarcinoma (HCC)   09/18/2022 -  Chemotherapy   Patient is on Treatment Plan : BILIARY TRACT Cisplatin + Gemcitabine D1,8 + Durvalumab (1500) D1 q21d / Durvalumab (1500) q28d     Metastasis to lung (HCC)  09/03/2022 Initial Diagnosis   Metastasis to lung (HCC)   09/18/2022 -  Chemotherapy   Patient is on Treatment Plan : BILIARY TRACT Cisplatin + Gemcitabine D1,8 + Durvalumab (1500) D1 q21d / Durvalumab (1500) q28d       MEDICAL HISTORY:  Past Medical History:  Diagnosis Date   Allergy    Bile duct cancer (HCC)    Sarcoidosis 2011    SURGICAL HISTORY: Past Surgical History:  Procedure Laterality Date   CESAREAN SECTION     x 2   COLONOSCOPY WITH PROPOFOL N/A 08/10/2019   Procedure: COLONOSCOPY WITH PROPOFOL;  Surgeon: Midge Minium, MD;  Location[facility];  Service: Endoscopy;  Laterality: N/A;  priority 4   IR IMAGING GUIDED PORT INSERTION  09/11/2022   LUNG BIOPSY      SOCIAL HISTORY: Social History   Socioeconomic History   Marital status: Single    Spouse name: Not on file   Number of children: 2   Years of education: Not on file   Highest education level: Not on file  Occupational History   Not on file  Tobacco Use   Smoking status: Never   Smokeless tobacco:  Never  Vaping Use   Vaping status: Never Used  Substance and Sexual Activity   Alcohol use: Not Currently    Alcohol/week: 4.0 standard drinks of alcohol    Types: 4 Glasses of wine per week   Drug use: No   Sexual activity: Yes    Partners: Male    Birth control/protection: Surgical  Other Topics Concern   Not on file  Social History Narrative   Not on file   Social Drivers of Health   Financial Resource Strain: Low Risk  (09/18/2022)   Overall Financial Resource Strain (CARDIA)    Difficulty of Paying Living Expenses: Not hard at all  Food Insecurity: No Food Insecurity (08/24/2022)   Hunger Vital Sign    Worried About Running Out of Food in the Last Year: Never true    Ran  Out of Food in the Last Year: Never true  Transportation Needs: No Transportation Needs (08/24/2022)   PRAPARE - Administrator, Civil Service (Medical): No    Lack of Transportation (Non-Medical): No  Physical Activity: Not on file  Stress: Not on file  Social Connections: Not on file  Intimate Partner Violence: Not At Risk (08/24/2022)   Humiliation, Afraid, Rape, and Kick questionnaire    Fear of Current or Ex-Partner: No    Emotionally Abused: No    Physically Abused: No    Sexually Abused: No    FAMILY HISTORY: Family History  Problem Relation Age of Onset   Stroke Mother    Heart disease Mother    Heart attack Father 55   Heart disease Father    Kidney disease Sister     ALLERGIES:  is allergic to other.  MEDICATIONS:  Current Outpatient Medications  Medication Sig Dispense Refill   albuterol (VENTOLIN HFA) 108 (90 Base) MCG/ACT inhaler Inhale 2 puffs into the lungs every 6 (six) hours as needed for wheezing or shortness of breath. 8 g 0   amLODipine (NORVASC) 5 MG tablet Take 1 tablet (5 mg total) by mouth daily. 90 tablet 1   chlorpheniramine-HYDROcodone (TUSSIONEX) 10-8 MG/5ML Take 5 mLs by mouth at bedtime as needed for cough. 140 mL 0   dexamethasone (DECADRON) 4 MG tablet Take 2 tablets (8 mg) by mouth daily x 3 days starting the day after cisplatin chemotherapy. Take with food. 30 tablet 0   loratadine (CLARITIN) 10 MG tablet Take 10 mg by mouth daily.     ondansetron (ZOFRAN) 8 MG tablet Take 1 tablet (8 mg total) by mouth every 8 (eight) hours as needed for nausea or vomiting. Start on the third day after cisplatin. (Patient not taking: Reported on 11/26/2022) 30 tablet 1   prochlorperazine (COMPAZINE) 10 MG tablet Take 1 tablet (10 mg total) by mouth every 6 (six) hours as needed (Nausea or vomiting). (Patient not taking: Reported on 11/05/2022) 30 tablet 1   pseudoephedrine-acetaminophen (TYLENOL SINUS) 30-500 MG TABS tablet Take 1 tablet by mouth in the  morning and at bedtime.     No current facility-administered medications for this visit.   Facility-Administered Medications Ordered in Other Visits  Medication Dose Route Frequency Provider Last Rate Last Admin   heparin lock flush 100 unit/mL  500 Units Intracatheter Once PRN Michaelyn Barter, MD        REVIEW OF SYSTEMS:   Pertinent information mentioned in HPI All other systems were reviewed with the patient and are negative.  PHYSICAL EXAMINATION: ECOG PERFORMANCE STATUS: 1 - Symptomatic but completely ambulatory  Vitals:   03/26/23 0856 03/26/23 0905  BP: (!) 160/100 (!) 142/90  Pulse: 95   Resp: 20   Temp: 97.6 F (36.4 C)   SpO2: 98%       Filed Weights   03/26/23 0856  Weight: 241 lb (109.3 kg)       GENERAL:alert, no distress and comfortable SKIN: skin color, texture, turgor are normal, no rashes or significant lesions EYES: normal, conjunctiva are pink and non-injected, sclera clear OROPHARYNX:no exudate, no erythema and lips, buccal mucosa, and tongue normal  NECK: supple, thyroid normal size, non-tender, without nodularity LYMPH:  no palpable lymphadenopathy in the cervical, axillary or inguinal LUNGS: clear to auscultation and percussion with normal breathing effort HEART: regular rate & rhythm and no murmurs and no lower extremity edema ABDOMEN:abdomen soft, non-tender and normal bowel sounds Musculoskeletal:no cyanosis of digits and no clubbing  PSYCH: alert & oriented x 3 with fluent speech NEURO: no focal motor/sensory deficits  LABORATORY DATA:  I have reviewed the data as listed Lab Results  Component Value Date   WBC 4.9 03/26/2023   HGB 9.4 (L) 03/26/2023   HCT 29.1 (L) 03/26/2023   MCV 119.8 (H) 03/26/2023   PLT 204 03/26/2023   Recent Labs    03/02/23 1018 03/08/23 0957 03/26/23 0841  NA 136 135 134*  K 3.9 4.1 3.8  CL 100 98 104  CO2 25 26 23   GLUCOSE 102* 104* 103*  BUN 24* 23* 13  CREATININE 0.57 0.74 0.58  CALCIUM 9.4  9.5 8.8*  GFRNONAA >60 >60 >60  PROT 6.9 6.8 6.8  ALBUMIN 4.0 4.1 3.6  AST 61* 34 32  ALT 113* 54* 25  ALKPHOS 94 87 110  BILITOT 0.8 0.8 0.6    RADIOGRAPHIC STUDIES: I have personally reviewed the radiological images as listed and agreed with the findings in the report. CT CHEST ABDOMEN PELVIS W CONTRAST Result Date: 02/26/2023 CLINICAL DATA:  Cholangiocarcinoma, assess treatment response. * Tracking Code: BO * EXAM: CT CHEST, ABDOMEN, AND PELVIS WITH CONTRAST TECHNIQUE: Multidetector CT imaging of the chest, abdomen and pelvis was performed following the standard protocol during bolus administration of intravenous contrast. RADIATION DOSE REDUCTION: This exam was performed according to the departmental dose-optimization program which includes automated exposure control, adjustment of the mA and/or kV according to patient size and/or use of iterative reconstruction technique. CONTRAST:  OMNIPAQUE IOHEXOL 300 MG/ML  SOLN COMPARISON:  Multiple priors including most recent CT November 30, 2022 FINDINGS: CT CHEST FINDINGS Cardiovascular: Right chest Port-A-Cath with tip at the superior cavoatrial junction. Normal caliber thoracic aorta. No central pulmonary embolus on this nondedicated study. Normal size heart. No significant pericardial effusion/thickening. Mediastinum/Nodes: No suspicious thyroid nodule. No pathologically enlarged mediastinal, hilar or axillary lymph nodes. The esophagus is grossly unremarkable. Lungs/Pleura: Innumerable hematologic loosely distributed solid pulmonary nodules are not significantly changed from prior examination. No new suspicious pulmonary nodule identified. For reference: -nodule in the paramedian right lower lobe measures 18 mm on image 96/4, unchanged. -bilobed pulmonary nodule in the posterior right lower lobe measures 2.2 cm on image 119/4, unchanged. No pleural effusion.  No pneumothorax. Musculoskeletal: Sclerotic osseous metastasis in the T3 vertebral body is  similar prior examination. No new suspicious osseous lesion identified. CT ABDOMEN PELVIS FINDINGS Hepatobiliary: Dominant heterogeneous lesion in the central left lobe of the liver measures 7.0 x 5.6 cm on image 62/2 previously 7.3 x 6.1 cm. Decreased size of the hypodense heterogeneous 11 mm lesion in the inferior right lobe  of the liver on image 71/2 previously 15 mm. Stable tiny hypodense 4 mm lesion in the anterior left lobe of the liver on image 56/2. No new suspicious hepatic lesion. High density material in the gallbladder again noted. Pancreas: No pancreatic ductal dilation or evidence of acute inflammation. Spleen: No splenomegaly. Adrenals/Urinary Tract: Bilateral adrenal glands appear normal. No hydronephrosis. Kidneys demonstrate symmetric enhancement. Urinary bladder is unremarkable for degree of distension. Stomach/Bowel: No radiopaque enteric contrast material was administered. Stomach is minimally distended limiting evaluation. No pathologic dilation of small or large bowel. Normal appendix. Left-sided colonic diverticulosis without findings of acute diverticulitis. Vascular/Lymphatic: Normal caliber abdominal aorta. Smooth IVC contours. The portal, splenic and superior mesenteric veins are patent. Similar to slightly decreased left periaortic lymph nodes the largest measures 7 mm in short axis on image 69/2 previously 8 mm. Reproductive: Uterus and bilateral adnexa are unremarkable. Other: New tiny 1-2 mm soft tissue nodule anterior to the left lobe of the liver on image 57/2. No significant abdominopelvic free fluid. Musculoskeletal: Multilevel degenerative change of the spine. IMPRESSION: 1. Decreased size of the dominant lesion in the central left lobe of the liver and in the 11 mm lesion in the inferior right lobe of the liver. No new suspicious hepatic lesion identified. 2. Stable pulmonary metastasis. No new suspicious pulmonary nodule identified. 3. Sclerotic osseous metastasis in the T3  vertebral body is similar prior examination. No new suspicious osseous lesion identified. 4. New tiny 1-2 mm soft tissue nodule anterior to the left lobe of the liver, nonspecific and possibly reflecting a tiny peritoneal implant. No ascites. Suggest continued attention on follow-up imaging. 5. Stable to slightly decreased size of the left periaortic lymph nodes. 6. Left-sided colonic diverticulosis without findings of acute diverticulitis. Electronically Signed   By: Maudry Mayhew M.D.   On: 02/26/2023 15:23

## 2023-03-27 ENCOUNTER — Other Ambulatory Visit: Payer: Self-pay

## 2023-03-27 LAB — T4: T4, Total: 7.9 ug/dL (ref 4.5–12.0)

## 2023-03-27 LAB — CANCER ANTIGEN 19-9: CA 19-9: 167 U/mL — ABNORMAL HIGH (ref 0–35)

## 2023-03-28 ENCOUNTER — Other Ambulatory Visit: Payer: Self-pay

## 2023-04-02 ENCOUNTER — Inpatient Hospital Stay: Payer: BC Managed Care – PPO | Attending: Internal Medicine

## 2023-04-02 ENCOUNTER — Inpatient Hospital Stay: Payer: BC Managed Care – PPO

## 2023-04-02 VITALS — BP 143/91 | HR 89 | Temp 97.9°F | Resp 20 | Wt 241.1 lb

## 2023-04-02 DIAGNOSIS — C221 Intrahepatic bile duct carcinoma: Secondary | ICD-10-CM | POA: Diagnosis not present

## 2023-04-02 DIAGNOSIS — Z8505 Personal history of malignant neoplasm of liver: Secondary | ICD-10-CM | POA: Diagnosis not present

## 2023-04-02 DIAGNOSIS — Z79899 Other long term (current) drug therapy: Secondary | ICD-10-CM | POA: Insufficient documentation

## 2023-04-02 DIAGNOSIS — T451X5A Adverse effect of antineoplastic and immunosuppressive drugs, initial encounter: Secondary | ICD-10-CM | POA: Insufficient documentation

## 2023-04-02 DIAGNOSIS — D6481 Anemia due to antineoplastic chemotherapy: Secondary | ICD-10-CM | POA: Diagnosis not present

## 2023-04-02 DIAGNOSIS — Z5111 Encounter for antineoplastic chemotherapy: Secondary | ICD-10-CM | POA: Diagnosis not present

## 2023-04-02 DIAGNOSIS — C7800 Secondary malignant neoplasm of unspecified lung: Secondary | ICD-10-CM | POA: Diagnosis not present

## 2023-04-02 DIAGNOSIS — D869 Sarcoidosis, unspecified: Secondary | ICD-10-CM | POA: Insufficient documentation

## 2023-04-02 DIAGNOSIS — I10 Essential (primary) hypertension: Secondary | ICD-10-CM | POA: Insufficient documentation

## 2023-04-02 DIAGNOSIS — C7801 Secondary malignant neoplasm of right lung: Secondary | ICD-10-CM

## 2023-04-02 LAB — CBC WITH DIFFERENTIAL (CANCER CENTER ONLY)
Abs Immature Granulocytes: 0.01 10*3/uL (ref 0.00–0.07)
Basophils Absolute: 0 10*3/uL (ref 0.0–0.1)
Basophils Relative: 0 %
Eosinophils Absolute: 0 10*3/uL (ref 0.0–0.5)
Eosinophils Relative: 1 %
HCT: 31.9 % — ABNORMAL LOW (ref 36.0–46.0)
Hemoglobin: 10.5 g/dL — ABNORMAL LOW (ref 12.0–15.0)
Immature Granulocytes: 0 %
Lymphocytes Relative: 14 %
Lymphs Abs: 0.7 10*3/uL (ref 0.7–4.0)
MCH: 38.6 pg — ABNORMAL HIGH (ref 26.0–34.0)
MCHC: 32.9 g/dL (ref 30.0–36.0)
MCV: 117.3 fL — ABNORMAL HIGH (ref 80.0–100.0)
Monocytes Absolute: 0.5 10*3/uL (ref 0.1–1.0)
Monocytes Relative: 11 %
Neutro Abs: 3.4 10*3/uL (ref 1.7–7.7)
Neutrophils Relative %: 74 %
Platelet Count: 227 10*3/uL (ref 150–400)
RBC: 2.72 MIL/uL — ABNORMAL LOW (ref 3.87–5.11)
RDW: 14.9 % (ref 11.5–15.5)
WBC Count: 4.6 10*3/uL (ref 4.0–10.5)
nRBC: 0 % (ref 0.0–0.2)

## 2023-04-02 LAB — CMP (CANCER CENTER ONLY)
ALT: 23 U/L (ref 0–44)
AST: 33 U/L (ref 15–41)
Albumin: 3.7 g/dL (ref 3.5–5.0)
Alkaline Phosphatase: 110 U/L (ref 38–126)
Anion gap: 9 (ref 5–15)
BUN: 13 mg/dL (ref 6–20)
CO2: 24 mmol/L (ref 22–32)
Calcium: 8.9 mg/dL (ref 8.9–10.3)
Chloride: 101 mmol/L (ref 98–111)
Creatinine: 0.74 mg/dL (ref 0.44–1.00)
GFR, Estimated: >60 mL/min (ref >60)
Glucose, Bld: 103 mg/dL — ABNORMAL HIGH (ref 70–99)
Potassium: 3.7 mmol/L (ref 3.5–5.1)
Sodium: 134 mmol/L — ABNORMAL LOW (ref 135–145)
Total Bilirubin: 0.7 mg/dL (ref 0.0–1.2)
Total Protein: 7 g/dL (ref 6.5–8.1)

## 2023-04-02 MED ORDER — SODIUM CHLORIDE 0.9 % IV SOLN
INTRAVENOUS | Status: DC
  Filled 2023-04-02: qty 250

## 2023-04-02 MED ORDER — HEPARIN SOD (PORK) LOCK FLUSH 100 UNIT/ML IV SOLN
500.0000 [IU] | Freq: Once | INTRAVENOUS | Status: AC | PRN
Start: 2023-04-02 — End: 2023-04-02
  Administered 2023-04-02: 500 [IU]
  Filled 2023-04-02: qty 5

## 2023-04-02 MED ORDER — SODIUM CHLORIDE 0.9% FLUSH
3.0000 mL | INTRAVENOUS | Status: DC | PRN
  Administered 2023-04-02: 10 mL
  Filled 2023-04-02: qty 3

## 2023-04-02 MED ORDER — SODIUM CHLORIDE 0.9 % IV SOLN
1000.0000 mg/m2 | Freq: Once | INTRAVENOUS | Status: AC
  Administered 2023-04-02: 2318 mg via INTRAVENOUS
  Filled 2023-04-02: qty 60.96

## 2023-04-02 MED ORDER — PROCHLORPERAZINE MALEATE 10 MG PO TABS
10.0000 mg | ORAL_TABLET | Freq: Once | ORAL | Status: AC
  Administered 2023-04-02: 10 mg via ORAL
  Filled 2023-04-02: qty 1

## 2023-04-05 ENCOUNTER — Encounter: Payer: Self-pay | Admitting: Internal Medicine

## 2023-04-09 ENCOUNTER — Encounter: Payer: Self-pay | Admitting: Internal Medicine

## 2023-04-09 ENCOUNTER — Telehealth: Payer: Self-pay

## 2023-04-09 ENCOUNTER — Inpatient Hospital Stay (HOSPITAL_BASED_OUTPATIENT_CLINIC_OR_DEPARTMENT_OTHER): Payer: BC Managed Care – PPO | Admitting: Internal Medicine

## 2023-04-09 ENCOUNTER — Inpatient Hospital Stay: Payer: BC Managed Care – PPO

## 2023-04-09 ENCOUNTER — Inpatient Hospital Stay: Payer: BC Managed Care – PPO | Admitting: Oncology

## 2023-04-09 ENCOUNTER — Other Ambulatory Visit: Payer: Self-pay

## 2023-04-09 VITALS — BP 140/90 | HR 90 | Temp 97.7°F | Resp 16 | Wt 241.0 lb

## 2023-04-09 VITALS — BP 125/78 | HR 88 | Resp 16

## 2023-04-09 DIAGNOSIS — D6481 Anemia due to antineoplastic chemotherapy: Secondary | ICD-10-CM | POA: Diagnosis not present

## 2023-04-09 DIAGNOSIS — Z5111 Encounter for antineoplastic chemotherapy: Secondary | ICD-10-CM | POA: Diagnosis not present

## 2023-04-09 DIAGNOSIS — C221 Intrahepatic bile duct carcinoma: Secondary | ICD-10-CM

## 2023-04-09 DIAGNOSIS — C7801 Secondary malignant neoplasm of right lung: Secondary | ICD-10-CM

## 2023-04-09 DIAGNOSIS — C7802 Secondary malignant neoplasm of left lung: Secondary | ICD-10-CM

## 2023-04-09 DIAGNOSIS — T451X5A Adverse effect of antineoplastic and immunosuppressive drugs, initial encounter: Secondary | ICD-10-CM | POA: Diagnosis not present

## 2023-04-09 DIAGNOSIS — C7800 Secondary malignant neoplasm of unspecified lung: Secondary | ICD-10-CM | POA: Diagnosis not present

## 2023-04-09 DIAGNOSIS — Z8505 Personal history of malignant neoplasm of liver: Secondary | ICD-10-CM | POA: Diagnosis not present

## 2023-04-09 DIAGNOSIS — D869 Sarcoidosis, unspecified: Secondary | ICD-10-CM | POA: Diagnosis not present

## 2023-04-09 DIAGNOSIS — Z79899 Other long term (current) drug therapy: Secondary | ICD-10-CM | POA: Diagnosis not present

## 2023-04-09 DIAGNOSIS — I10 Essential (primary) hypertension: Secondary | ICD-10-CM | POA: Diagnosis not present

## 2023-04-09 LAB — CBC WITH DIFFERENTIAL (CANCER CENTER ONLY)
Abs Immature Granulocytes: 0.04 10*3/uL (ref 0.00–0.07)
Basophils Absolute: 0 10*3/uL (ref 0.0–0.1)
Basophils Relative: 1 %
Eosinophils Absolute: 0.1 10*3/uL (ref 0.0–0.5)
Eosinophils Relative: 2 %
HCT: 31.7 % — ABNORMAL LOW (ref 36.0–46.0)
Hemoglobin: 10.3 g/dL — ABNORMAL LOW (ref 12.0–15.0)
Immature Granulocytes: 1 %
Lymphocytes Relative: 21 %
Lymphs Abs: 0.8 10*3/uL (ref 0.7–4.0)
MCH: 37.9 pg — ABNORMAL HIGH (ref 26.0–34.0)
MCHC: 32.5 g/dL (ref 30.0–36.0)
MCV: 116.5 fL — ABNORMAL HIGH (ref 80.0–100.0)
Monocytes Absolute: 0.4 10*3/uL (ref 0.1–1.0)
Monocytes Relative: 10 %
Neutro Abs: 2.4 10*3/uL (ref 1.7–7.7)
Neutrophils Relative %: 65 %
Platelet Count: 121 10*3/uL — ABNORMAL LOW (ref 150–400)
RBC: 2.72 MIL/uL — ABNORMAL LOW (ref 3.87–5.11)
RDW: 14 % (ref 11.5–15.5)
WBC Count: 3.7 10*3/uL — ABNORMAL LOW (ref 4.0–10.5)
nRBC: 0 % (ref 0.0–0.2)

## 2023-04-09 LAB — CMP (CANCER CENTER ONLY)
ALT: 57 U/L — ABNORMAL HIGH (ref 0–44)
AST: 50 U/L — ABNORMAL HIGH (ref 15–41)
Albumin: 3.6 g/dL (ref 3.5–5.0)
Alkaline Phosphatase: 119 U/L (ref 38–126)
Anion gap: 8 (ref 5–15)
BUN: 12 mg/dL (ref 6–20)
CO2: 24 mmol/L (ref 22–32)
Calcium: 8.9 mg/dL (ref 8.9–10.3)
Chloride: 103 mmol/L (ref 98–111)
Creatinine: 0.66 mg/dL (ref 0.44–1.00)
GFR, Estimated: >60 mL/min (ref >60)
Glucose, Bld: 100 mg/dL — ABNORMAL HIGH (ref 70–99)
Potassium: 4 mmol/L (ref 3.5–5.1)
Sodium: 135 mmol/L (ref 135–145)
Total Bilirubin: 0.5 mg/dL (ref 0.0–1.2)
Total Protein: 7 g/dL (ref 6.5–8.1)

## 2023-04-09 MED ORDER — GEMCITABINE HCL CHEMO INJECTION 1 GM/26.3ML
1000.0000 mg/m2 | Freq: Once | INTRAVENOUS | Status: AC
  Administered 2023-04-09: 2318 mg via INTRAVENOUS
  Filled 2023-04-09: qty 60.96

## 2023-04-09 MED ORDER — SODIUM CHLORIDE 0.9% FLUSH
10.0000 mL | INTRAVENOUS | Status: DC | PRN
  Administered 2023-04-09: 10 mL
  Filled 2023-04-09: qty 10

## 2023-04-09 MED ORDER — STERILE WATER FOR INJECTION IJ SOLN
5.0000 mL | Freq: Four times a day (QID) | ORAL | 3 refills | Status: DC | PRN
Start: 2023-04-09 — End: 2023-05-21
  Filled 2023-04-09: qty 480, 12d supply, fill #0

## 2023-04-09 MED ORDER — HEPARIN SOD (PORK) LOCK FLUSH 100 UNIT/ML IV SOLN
500.0000 [IU] | Freq: Once | INTRAVENOUS | Status: AC | PRN
  Administered 2023-04-09: 500 [IU]
  Filled 2023-04-09: qty 5

## 2023-04-09 MED ORDER — SODIUM CHLORIDE 0.9 % IV SOLN
INTRAVENOUS | Status: DC
  Filled 2023-04-09: qty 250

## 2023-04-09 MED ORDER — PROCHLORPERAZINE MALEATE 10 MG PO TABS
10.0000 mg | ORAL_TABLET | Freq: Once | ORAL | Status: AC
Start: 2023-04-09 — End: 2023-04-09
  Administered 2023-04-09: 10 mg via ORAL
  Filled 2023-04-09: qty 1

## 2023-04-09 NOTE — Progress Notes (Signed)
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 Her cough has worsened, the shortness of breath is about the same. Her knee is still bothering her. She is having some sores in her mouth and bleeding when brushing, so I told her that I could send over some magic mouthwash to our pharmacy and she said she would pick it up when leaving our office today.

## 2023-04-09 NOTE — Telephone Encounter (Signed)
Faxed over the Magic Mouthwash to the [facility], and told patient that she should be able to pick it up from pharmacy after leaving our office. Patient agreed with the plan and was thankful.

## 2023-04-09 NOTE — Progress Notes (Signed)
[facility] CONSULT NOTE  Patient Care Team: Reubin Milan, MD as PCP - General (Internal Medicine) Glory Buff, RN as Oncology Nurse Navigator Michaelyn Barter, MD as Consulting Physician (Oncology)   CANCER STAGING   Cancer Staging  Cholangiocarcinoma [facility]) Staging form: Intrahepatic Bile Duct, AJCC 8th Edition - Clinical stage from 08/26/2022: Stage IV (cM1) - Signed by Michaelyn Barter, MD on 09/03/2022 Stage prefix: Initial diagnosis   Treatment Cisplatin 25 mg/m/gemcitabine 1000 mg/m/Durvalumab -completed 8 cycles on 03/04/2023 Maintenance Durvalumab/gemcitabine-started 03/26/2023  ASSESSMENT & PLAN:  Vicki Phillips 58 y.o. 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  # Intrahepatic Cholangiocarcinoma, stage IV - s/p liver biopsy on 08/26/2022 showed metastatic moderately differentiated adenocarcinoma.  IHC stain positive for CK7 and CK20.  TTF-1, PAX8, GATA3 and CDX2 are negative.  Differentials include cholangiocarcinoma and pancreatic carcinoma.   Plan- -PD-L1 0%.  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 -s/p cisplatin, gemcitabine and Durvalumab every 3 weeks for 8 cycles on 03/04/2023. -Care coordination with Dr. Hulen Luster at [facility].  Recommending extrapolation of data from keynote 966 and consideration of durvalumab/gemcitabine maintenance. -Labs reviewed and acceptable for treatment.  Will proceed with Durvalumab 1500 mg and gemcitabine 1000 mg/m day 1 and day 8 every 4 weeks.  Plan to repeat CT imaging beginning of April 2025. -Will continue to trend CA 19-9.  Currently overall stable.  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 -Improving since off steroid.  Will monitor for now.  # Transaminitis -Mild.  Treatment related.  Monitor.  # Chemotherapy related  anemia -Iron panel consistent with anemia of chronic disease - s/p  1 U PRBC on 01/05/23. - s/p Retacrit 40,000 unit (started on 12/18/2022) x 3 doses.   # Shortness of breath -Stable.  Continue with albuterol inhaler  # Right knee pain -MRI right knee showing horizontal tear in the posterior horn median meniscus and moderate-sized Baker's cyst with suspected leak or rupture, tricompartmental osteoarthritis.   -Seen by Raechel Chute.  Had cortisone shot with mild improvement.    # Hypertension -Continue with amlodipine 5 mg daily.   # Cough -Continue with Tussionex as needed.  # Poor appetite -Discussed about nutrition consult and appetite stimulant.  Would like to hold off  # History of sarcoidosis -Management per pulmonary -Never required treatment.  Orders Placed This Encounter  Procedures   CBC with Differential (Cancer Center Only)    Standing Status:   Future    Expected Date:   04/23/2023    Expiration Date:   04/22/2024   CMP (Cancer Center only)    Standing Status:   Future    Expected Date:   04/23/2023    Expiration Date:   04/22/2024   T4    Standing Status:   Future    Expected Date:   04/23/2023    Expiration Date:   04/22/2024   TSH    Standing Status:   Future    Expected Date:   04/23/2023    Expiration Date:   04/22/2024   CBC with Differential (Cancer Center Only)    Standing Status:   Future    Expected Date:   04/30/2023    Expiration Date:   04/29/2024   CMP (Cancer Center only)    Standing Status:   Future    Expected Date:   04/30/2023    Expiration Date:   04/29/2024   RTC in 2 weeks  for MD visit, labs, Durvalumab and gemcitabine  The total time spent in the appointment was 30 minutes encounter with patients including review of chart and various tests results, discussions about plan of care and coordination of care plan   All questions were answered. The patient knows to call the clinic with any problems, questions or concerns. No barriers to learning was detected.  Michaelyn Barter, MD 2/14/20259:54 AM   HISTORY OF  PRESENTING ILLNESS:  Vicki Phillips 58 y.o. 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  Patient reports she presented to ED with complaints of chest pain.  Had chest x-ray which showed pulmonary nodules was referred to pulmonary.  Records not available.  Was then seen by Dr. Aundria Rud on 08/05/2022.  CT chest showed innumerable pulmonary nodules of varying sizes throughout the lung.  Largest spiculated appearing nodule of anterior right upper lobe measuring 2 x 2 cm, at least 1 ill-defined hypodense lesion of the anterior left lobe of the liver.  Despite stated history of sarcoidosis, there are no specific or characteristic features of sarcoidosis with respect to distribution, morphology, associated fibrotic change, nor associated mediastinal lymphadenopathy. 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  She has history of sarcoidosis but was never on treatment.  Reports that she does not see doctors on a regular basis because she is had bad experiences.  Interval history Patient seen today as follow-up now on maintenance gemcitabine and Durvalumab combination.  Her last week has been rough.  She is starting to feel more tired since she has been off the Decadron.  Last week her nausea was the worst.  Using Zofran.  Continues to have issues with constipation and diarrhea which tends to alternate.  Chronic cough.  He uses Tussionex at night.  Does not want to use any other cough syrups during the day.  Shortness of breath is stable.   I have reviewed her chart and materials related to her cancer extensively and collaborated history with the patient. Summary of oncologic history is as follows: Oncology History  Cholangiocarcinoma (HCC)  08/26/2022 Cancer Staging    Staging form: Intrahepatic Bile Duct, AJCC 8th Edition - Clinical stage from 08/26/2022: Stage IV (cM1) - Signed by Michaelyn Barter, MD on 09/03/2022 Stage prefix: Initial diagnosis   09/03/2022 Initial Diagnosis   Cholangiocarcinoma (HCC)   09/18/2022 -  Chemotherapy   Patient is on Treatment Plan : BILIARY TRACT Cisplatin + Gemcitabine D1,8 + Durvalumab (1500) D1 q21d / Durvalumab (1500) q28d     Metastasis to lung (HCC)  09/03/2022 Initial Diagnosis   Metastasis to lung (HCC)   09/18/2022 -  Chemotherapy   Patient is on Treatment Plan : BILIARY TRACT Cisplatin + Gemcitabine D1,8 + Durvalumab (1500) D1 q21d / Durvalumab (1500) q28d       MEDICAL HISTORY:  Past Medical History:  Diagnosis Date   Allergy    Bile duct cancer (HCC)    Sarcoidosis 2011    SURGICAL HISTORY: Past Surgical History:  Procedure Laterality Date   CESAREAN SECTION     x 2   COLONOSCOPY WITH PROPOFOL N/A 08/10/2019   Procedure: COLONOSCOPY WITH PROPOFOL;  Surgeon: Midge Minium, MD;  Location: [facility];  Service: Endoscopy;  Laterality: N/A;  priority 4   IR IMAGING GUIDED PORT INSERTION  09/11/2022  LUNG BIOPSY      SOCIAL HISTORY: Social History   Socioeconomic History   Marital status: Single    Spouse name: Not on file   Number of children: 2   Years of education: Not on file   Highest education level: Not on file  Occupational History   Not on file  Tobacco Use   Smoking status: Never   Smokeless tobacco: Never  Vaping Use   Vaping status: Never Used  Substance and Sexual Activity   Alcohol use: Not Currently    Alcohol/week: 4.0 standard drinks of alcohol    Types: 4 Glasses of wine per week   Drug use: No   Sexual activity: Yes    Partners: Male    Birth control/protection: Surgical  Other Topics Concern   Not on file  Social History Narrative   Not on file   Social Drivers of Health   Financial Resource Strain: Low Risk  (09/18/2022)   Overall Financial Resource  Strain (CARDIA)    Difficulty of Paying Living Expenses: Not hard at all  Food Insecurity: No Food Insecurity (08/24/2022)   Hunger Vital Sign    Worried About Running Out of Food in the Last Year: Never true    Ran Out of Food in the Last Year: Never true  Transportation Needs: No Transportation Needs (08/24/2022)   PRAPARE - Administrator, Civil Service (Medical): No    Lack of Transportation (Non-Medical): No  Physical Activity: Not on file  Stress: Not on file  Social Connections: Not on file  Intimate Partner Violence: Not At Risk (08/24/2022)   Humiliation, Afraid, Rape, and Kick questionnaire    Fear of Current or Ex-Partner: No    Emotionally Abused: No    Physically Abused: No    Sexually Abused: No    FAMILY HISTORY: Family History  Problem Relation Age of Onset   Stroke Mother    Heart disease Mother    Heart attack Father 64   Heart disease Father    Kidney disease Sister     ALLERGIES:  is allergic to other.  MEDICATIONS:  Current Outpatient Medications  Medication Sig Dispense Refill   albuterol (VENTOLIN HFA) 108 (90 Base) MCG/ACT inhaler Inhale 2 puffs into the lungs every 6 (six) hours as needed for wheezing or shortness of breath. 8 g 0   amLODipine (NORVASC) 5 MG tablet Take 1 tablet (5 mg total) by mouth daily. 90 tablet 1   chlorpheniramine-HYDROcodone (TUSSIONEX) 10-8 MG/5ML Take 5 mLs by mouth at bedtime as needed for cough. 140 mL 0   ondansetron (ZOFRAN) 8 MG tablet Take 1 tablet (8 mg total) by mouth every 8 (eight) hours as needed for nausea or vomiting. Start on the third day after cisplatin. 30 tablet 1   prochlorperazine (COMPAZINE) 10 MG tablet Take 1 tablet (10 mg total) by mouth every 6 (six) hours as needed (Nausea or vomiting). 30 tablet 1   dexamethasone (DECADRON) 4 MG tablet Take 2 tablets (8 mg) by mouth daily x 3 days starting the day after cisplatin chemotherapy. Take with food. (Patient not taking: Reported on 04/09/2023) 30  tablet 0   loratadine (CLARITIN) 10 MG tablet Take 10 mg by mouth daily. (Patient not taking: Reported on 04/09/2023)     magic mouthwash (multi-ingredient) oral suspension Swish and swallow 5-10 mLs 4 (four) times daily as needed. 480 mL 3   pseudoephedrine-acetaminophen (TYLENOL SINUS) 30-500 MG TABS tablet Take 1 tablet by mouth in  the morning and at bedtime.     No current facility-administered medications for this visit.   Facility-Administered Medications Ordered in Other Visits  Medication Dose Route Frequency Provider Last Rate Last Admin   gemcitabine (GEMZAR) 2,318 mg in sodium chloride 0.9 % 250 mL chemo infusion  1,000 mg/m2 (Treatment Plan Recorded) Intravenous Once Michaelyn Barter, MD       heparin lock flush 100 unit/mL  500 Units Intracatheter Once PRN Michaelyn Barter, MD       prochlorperazine (COMPAZINE) tablet 10 mg  10 mg Oral Once Michaelyn Barter, MD        REVIEW OF SYSTEMS:   Pertinent information mentioned in HPI All other systems were reviewed with the patient and are negative.  PHYSICAL EXAMINATION: ECOG PERFORMANCE STATUS: 1 - Symptomatic but completely ambulatory  Vitals:   04/09/23 0912  BP: (!) 140/90  Pulse: 90  Resp: 16  Temp: 97.7 F (36.5 C)  SpO2: 99%      Filed Weights   04/09/23 0912  Weight: 241 lb (109.3 kg)       GENERAL:alert, no distress and comfortable SKIN: skin color, texture, turgor are normal, no rashes or significant lesions EYES: normal, conjunctiva are pink and non-injected, sclera clear OROPHARYNX:no exudate, no erythema and lips, buccal mucosa, and tongue normal  NECK: supple, thyroid normal size, non-tender, without nodularity LYMPH:  no palpable lymphadenopathy in the cervical, axillary or inguinal LUNGS: clear to auscultation and percussion with normal breathing effort HEART: regular rate & rhythm and no murmurs and no lower extremity edema ABDOMEN:abdomen soft, non-tender and normal bowel  sounds Musculoskeletal:no cyanosis of digits and no clubbing  PSYCH: alert & oriented x 3 with fluent speech NEURO: no focal motor/sensory deficits  LABORATORY DATA:  I have reviewed the data as listed Lab Results  Component Value Date   WBC 3.7 (L) 04/09/2023   HGB 10.3 (L) 04/09/2023   HCT 31.7 (L) 04/09/2023   MCV 116.5 (H) 04/09/2023   PLT 121 (L) 04/09/2023   Recent Labs    03/26/23 0841 04/02/23 0824 04/09/23 0856  NA 134* 134* 135  K 3.8 3.7 4.0  CL 104 101 103  CO2 23 24 24   GLUCOSE 103* 103* 100*  BUN 13 13 12   CREATININE 0.58 0.74 0.66  CALCIUM 8.8* 8.9 8.9  GFRNONAA >60 >60 >60  PROT 6.8 7.0 7.0  ALBUMIN 3.6 3.7 3.6  AST 32 33 50*  ALT 25 23 57*  ALKPHOS 110 110 119  BILITOT 0.6 0.7 0.5    RADIOGRAPHIC STUDIES: I have personally reviewed the radiological images as listed and agreed with the findings in the report. No results found.

## 2023-04-10 ENCOUNTER — Other Ambulatory Visit: Payer: Self-pay

## 2023-04-12 ENCOUNTER — Other Ambulatory Visit: Payer: Self-pay

## 2023-04-14 ENCOUNTER — Encounter: Payer: Self-pay | Admitting: Internal Medicine

## 2023-04-22 ENCOUNTER — Encounter: Payer: Self-pay | Admitting: Internal Medicine

## 2023-04-23 ENCOUNTER — Other Ambulatory Visit: Payer: Self-pay

## 2023-04-23 ENCOUNTER — Telehealth: Payer: Self-pay

## 2023-04-23 ENCOUNTER — Inpatient Hospital Stay: Payer: BC Managed Care – PPO

## 2023-04-23 ENCOUNTER — Encounter: Payer: Self-pay | Admitting: Internal Medicine

## 2023-04-23 ENCOUNTER — Inpatient Hospital Stay (HOSPITAL_BASED_OUTPATIENT_CLINIC_OR_DEPARTMENT_OTHER): Payer: BC Managed Care – PPO | Admitting: Internal Medicine

## 2023-04-23 VITALS — BP 141/87 | HR 87 | Resp 16

## 2023-04-23 VITALS — BP 144/88 | HR 87 | Temp 97.7°F | Resp 16 | Wt 241.0 lb

## 2023-04-23 DIAGNOSIS — Z5111 Encounter for antineoplastic chemotherapy: Secondary | ICD-10-CM | POA: Diagnosis not present

## 2023-04-23 DIAGNOSIS — C7801 Secondary malignant neoplasm of right lung: Secondary | ICD-10-CM

## 2023-04-23 DIAGNOSIS — Z5112 Encounter for antineoplastic immunotherapy: Secondary | ICD-10-CM | POA: Diagnosis not present

## 2023-04-23 DIAGNOSIS — C7802 Secondary malignant neoplasm of left lung: Secondary | ICD-10-CM

## 2023-04-23 DIAGNOSIS — C7800 Secondary malignant neoplasm of unspecified lung: Secondary | ICD-10-CM | POA: Diagnosis not present

## 2023-04-23 DIAGNOSIS — D6481 Anemia due to antineoplastic chemotherapy: Secondary | ICD-10-CM | POA: Diagnosis not present

## 2023-04-23 DIAGNOSIS — Z8505 Personal history of malignant neoplasm of liver: Secondary | ICD-10-CM | POA: Diagnosis not present

## 2023-04-23 DIAGNOSIS — D869 Sarcoidosis, unspecified: Secondary | ICD-10-CM | POA: Diagnosis not present

## 2023-04-23 DIAGNOSIS — I10 Essential (primary) hypertension: Secondary | ICD-10-CM | POA: Diagnosis not present

## 2023-04-23 DIAGNOSIS — C221 Intrahepatic bile duct carcinoma: Secondary | ICD-10-CM

## 2023-04-23 DIAGNOSIS — Z79899 Other long term (current) drug therapy: Secondary | ICD-10-CM | POA: Diagnosis not present

## 2023-04-23 DIAGNOSIS — T451X5A Adverse effect of antineoplastic and immunosuppressive drugs, initial encounter: Secondary | ICD-10-CM | POA: Diagnosis not present

## 2023-04-23 LAB — CBC WITH DIFFERENTIAL (CANCER CENTER ONLY)
Abs Immature Granulocytes: 0.02 10*3/uL (ref 0.00–0.07)
Basophils Absolute: 0 10*3/uL (ref 0.0–0.1)
Basophils Relative: 1 %
Eosinophils Absolute: 0.2 10*3/uL (ref 0.0–0.5)
Eosinophils Relative: 4 %
HCT: 34.8 % — ABNORMAL LOW (ref 36.0–46.0)
Hemoglobin: 11.5 g/dL — ABNORMAL LOW (ref 12.0–15.0)
Immature Granulocytes: 1 %
Lymphocytes Relative: 12 %
Lymphs Abs: 0.5 10*3/uL — ABNORMAL LOW (ref 0.7–4.0)
MCH: 38.5 pg — ABNORMAL HIGH (ref 26.0–34.0)
MCHC: 33 g/dL (ref 30.0–36.0)
MCV: 116.4 fL — ABNORMAL HIGH (ref 80.0–100.0)
Monocytes Absolute: 0.4 10*3/uL (ref 0.1–1.0)
Monocytes Relative: 10 %
Neutro Abs: 3.2 10*3/uL (ref 1.7–7.7)
Neutrophils Relative %: 72 %
Platelet Count: 221 10*3/uL (ref 150–400)
RBC: 2.99 MIL/uL — ABNORMAL LOW (ref 3.87–5.11)
RDW: 14.7 % (ref 11.5–15.5)
WBC Count: 4.3 10*3/uL (ref 4.0–10.5)
nRBC: 0 % (ref 0.0–0.2)

## 2023-04-23 LAB — CMP (CANCER CENTER ONLY)
ALT: 34 U/L (ref 0–44)
AST: 41 U/L (ref 15–41)
Albumin: 3.9 g/dL (ref 3.5–5.0)
Alkaline Phosphatase: 111 U/L (ref 38–126)
Anion gap: 11 (ref 5–15)
BUN: 12 mg/dL (ref 6–20)
CO2: 24 mmol/L (ref 22–32)
Calcium: 9 mg/dL (ref 8.9–10.3)
Chloride: 105 mmol/L (ref 98–111)
Creatinine: 0.62 mg/dL (ref 0.44–1.00)
GFR, Estimated: >60 mL/min (ref >60)
Glucose, Bld: 106 mg/dL — ABNORMAL HIGH (ref 70–99)
Potassium: 3.8 mmol/L (ref 3.5–5.1)
Sodium: 140 mmol/L (ref 135–145)
Total Bilirubin: 0.5 mg/dL (ref 0.0–1.2)
Total Protein: 7 g/dL (ref 6.5–8.1)

## 2023-04-23 LAB — TSH: TSH: 1.804 u[IU]/mL (ref 0.350–4.500)

## 2023-04-23 MED ORDER — STERILE WATER FOR INJECTION IJ SOLN
5.0000 mL | Freq: Four times a day (QID) | ORAL | 3 refills | Status: DC | PRN
Start: 2023-04-23 — End: 2023-05-21
  Filled 2023-04-23 – 2023-04-27 (×2): qty 480, 12d supply, fill #0

## 2023-04-23 MED ORDER — HEPARIN SOD (PORK) LOCK FLUSH 100 UNIT/ML IV SOLN
500.0000 [IU] | Freq: Once | INTRAVENOUS | Status: AC | PRN
  Administered 2023-04-23: 500 [IU]
  Filled 2023-04-23: qty 5

## 2023-04-23 MED ORDER — HYDROCOD POLI-CHLORPHE POLI ER 10-8 MG/5ML PO SUER: 5.0000 mL | Freq: Every evening | ORAL | 0 refills | Status: DC | PRN

## 2023-04-23 MED ORDER — PROCHLORPERAZINE MALEATE 10 MG PO TABS
10.0000 mg | ORAL_TABLET | Freq: Once | ORAL | Status: AC
  Administered 2023-04-23: 10 mg via ORAL
  Filled 2023-04-23: qty 1

## 2023-04-23 MED ORDER — SODIUM CHLORIDE 0.9 % IV SOLN
1500.0000 mg | Freq: Once | INTRAVENOUS | Status: AC
  Administered 2023-04-23: 1500 mg via INTRAVENOUS
  Filled 2023-04-23: qty 30

## 2023-04-23 MED ORDER — SODIUM CHLORIDE 0.9 % IV SOLN
Freq: Once | INTRAVENOUS | Status: AC
Start: 2023-04-23 — End: 2023-04-23
  Filled 2023-04-23: qty 250

## 2023-04-23 MED ORDER — SODIUM CHLORIDE 0.9 % IV SOLN
1000.0000 mg/m2 | Freq: Once | INTRAVENOUS | Status: AC
  Administered 2023-04-23: 2318 mg via INTRAVENOUS
  Filled 2023-04-23: qty 60.96

## 2023-04-23 MED ORDER — SODIUM CHLORIDE 0.9% FLUSH
10.0000 mL | INTRAVENOUS | Status: DC | PRN
  Filled 2023-04-23: qty 10

## 2023-04-23 NOTE — Progress Notes (Signed)
 Patient is still having knee pain, and the shortness of breath, which she states that they have both got worse. She needs a refill on her Tussionex cough syrup. She didn't ever pick up the Magic Mouth wash prescription that I sent over our pharmacy, she didn't know where to go, I told her I can resend the script if need be and show her exactly where it is at.

## 2023-04-23 NOTE — Progress Notes (Signed)
 [facility] CONSULT NOTE  Patient Care Team: Reubin Milan, MD as PCP - General (Internal Medicine) Glory Buff, RN as Oncology Nurse Navigator Michaelyn Barter, MD as Consulting Physician (Oncology)   CANCER STAGING   Cancer Staging  Cholangiocarcinoma [facility]) Staging form: Intrahepatic Bile Duct, AJCC 8th Edition - Clinical stage from 08/26/2022: Stage IV (cM1) - Signed by Michaelyn Barter, MD on 09/03/2022 Stage prefix: Initial diagnosis   Treatment Cisplatin 25 mg/m/gemcitabine 1000 mg/m/Durvalumab -completed 8 cycles on 03/04/2023 Maintenance Durvalumab/gemcitabine-started 03/26/2023  ASSESSMENT & PLAN:  Vicki Phillips 58 y.o. 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  # Intrahepatic Cholangiocarcinoma, stage IV - s/p liver biopsy on 08/26/2022 showed metastatic moderately differentiated adenocarcinoma.  IHC stain positive for CK7 and CK20.  TTF-1, PAX8, GATA3 and CDX2 are negative.  Differentials include cholangiocarcinoma and pancreatic carcinoma.   Plan- -PD-L1 0%.  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 -s/p cisplatin, gemcitabine and Durvalumab every 3 weeks for 8 cycles on 03/04/2023. -Care coordination with Dr. Hulen Luster at [facility].  Recommending extrapolation of data from keynote 966 and consideration of durvalumab/gemcitabine maintenance. -CBC within parameters.  CMP is pending.  If within parameters, Will proceed with Durvalumab 1500 mg and gemcitabine 1000 mg/m day 1 and day 8 every 4 weeks.   -Follow-up in 4 weeks for maintenance Durvalumab and gemcitabine day 1 and day 8.   -Plan to repeat CT imaging beginning of April 2025. -Will continue to trend CA 19-9.  Currently overall stable.  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 -Improving since off steroid.  Will monitor for now.  # Transaminitis -Mild.  Treatment related.   Monitor.  # Chemotherapy related  anemia -Iron panel consistent with anemia of chronic disease - s/p 1 U PRBC on 01/05/23. - s/p Retacrit 40,000 unit (started on 12/18/2022) x 3 doses.   # Shortness of breath -Stable.  Continue with albuterol inhaler  # Right knee pain -MRI right knee showing horizontal tear in the posterior horn median meniscus and moderate-sized Baker's cyst with suspected leak or rupture, tricompartmental osteoarthritis.   -Seen by Raechel Chute.  Had cortisone shot with mild improvement.    # Hypertension -Continue with amlodipine 5 mg daily.   # Cough -Continue with Tussionex as needed.  # Poor appetite -Discussed about nutrition consult and appetite stimulant.  Would like to hold off  # History of sarcoidosis -Management per pulmonary -Never required treatment.  Orders Placed This Encounter  Procedures   CBC with Differential (Cancer Center Only)    Standing Status:   Future    Expected Date:   05/20/2023    Expiration Date:   05/19/2024   CMP (Cancer Center only)    Standing Status:   Future    Expected Date:   05/20/2023    Expiration Date:   05/19/2024   T4    Standing Status:   Future    Expected Date:   05/20/2023    Expiration Date:   05/19/2024   TSH    Standing Status:   Future    Expected Date:   05/20/2023    Expiration Date:   05/19/2024   CBC with Differential (Cancer Center Only)    Standing Status:   Future    Expected Date:   05/27/2023    Expiration Date:   05/26/2024   CMP (Cancer Center only)    Standing Status:   Future  Expected Date:   05/27/2023    Expiration Date:   05/26/2024   RTC in 2 weeks for MD visit, labs, Durvalumab and gemcitabine  The total time spent in the appointment was 30 minutes encounter with patients including review of chart and various tests results, discussions about plan of care and coordination of care plan   All questions were answered. The patient knows to call the clinic with any problems, questions or  concerns. No barriers to learning was detected.  Michaelyn Barter, MD 2/28/202510:02 AM   HISTORY OF PRESENTING ILLNESS:  Vicki Phillips 58 y.o. 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  Patient reports she presented to ED with complaints of chest pain.  Had chest x-ray which showed pulmonary nodules was referred to pulmonary.  Records not available.  Was then seen by Dr. Aundria Rud on 08/05/2022.  CT chest showed innumerable pulmonary nodules of varying sizes throughout the lung.  Largest spiculated appearing nodule of anterior right upper lobe measuring 2 x 2 cm, at least 1 ill-defined hypodense lesion of the anterior left lobe of the liver.  Despite stated history of sarcoidosis, there are no specific or characteristic features of sarcoidosis with respect to distribution, morphology, associated fibrotic change, nor associated mediastinal lymphadenopathy. 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  She has history of sarcoidosis but was never on treatment.  Reports that she does not see doctors on a regular basis because she is had bad experiences.  Interval history Patient seen today as follow-up now on maintenance gemcitabine and Durvalumab combination.  Patient reports increasing knee pain.  She has been walking more recently.  Her shortness of breath has worsened which is mainly on exertion.  Using albuterol inhaler as needed.  We discussed about CT chest which she would like to hold off at this time.  Continues to have fluctuations between diarrhea and constipation with her bowel movements.  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  Mild nausea manageable with antiemetics.   I have reviewed her chart and materials related to her cancer extensively and  collaborated history with the patient. Summary of oncologic history is as follows: Oncology History  Cholangiocarcinoma (HCC)  08/26/2022 Cancer Staging   Staging form: Intrahepatic Bile Duct, AJCC 8th Edition - Clinical stage from 08/26/2022: Stage IV (cM1) - Signed by Michaelyn Barter, MD on 09/03/2022 Stage prefix: Initial diagnosis   09/03/2022 Initial Diagnosis   Cholangiocarcinoma (HCC)   09/18/2022 -  Chemotherapy   Patient is on Treatment Plan : BILIARY TRACT Cisplatin + Gemcitabine D1,8 + Durvalumab (1500) D1 q21d / Durvalumab (1500) q28d     Metastasis to lung (HCC)  09/03/2022 Initial Diagnosis   Metastasis to lung (HCC)   09/18/2022 -  Chemotherapy   Patient is on Treatment Plan : BILIARY TRACT Cisplatin + Gemcitabine D1,8 + Durvalumab (1500) D1 q21d / Durvalumab (1500) q28d       MEDICAL HISTORY:  Past Medical History:  Diagnosis Date   Allergy    Bile duct cancer (HCC)    Sarcoidosis 2011    SURGICAL HISTORY: Past Surgical History:  Procedure Laterality Date   CESAREAN SECTION     x 2   COLONOSCOPY WITH PROPOFOL N/A 08/10/2019   Procedure: COLONOSCOPY WITH PROPOFOL;  Surgeon: Midge Minium, MD;  Location: [facility];  Service:  Endoscopy;  Laterality: N/A;  priority 4   IR IMAGING GUIDED PORT INSERTION  09/11/2022   LUNG BIOPSY      SOCIAL HISTORY: Social History   Socioeconomic History   Marital status: Single    Spouse name: Not on file   Number of children: 2   Years of education: Not on file   Highest education level: Not on file  Occupational History   Not on file  Tobacco Use   Smoking status: Never   Smokeless tobacco: Never  Vaping Use   Vaping status: Never Used  Substance and Sexual Activity   Alcohol use: Not Currently    Alcohol/week: 4.0 standard drinks of alcohol    Types: 4 Glasses of wine per week   Drug use: No   Sexual activity: Yes    Partners: Male    Birth control/protection: Surgical  Other Topics Concern   Not on file   Social History Narrative   Not on file   Social Drivers of Health   Financial Resource Strain: Low Risk  (09/18/2022)   Overall Financial Resource Strain (CARDIA)    Difficulty of Paying Living Expenses: Not hard at all  Food Insecurity: No Food Insecurity (08/24/2022)   Hunger Vital Sign    Worried About Running Out of Food in the Last Year: Never true    Ran Out of Food in the Last Year: Never true  Transportation Needs: No Transportation Needs (08/24/2022)   PRAPARE - Administrator, Civil Service (Medical): No    Lack of Transportation (Non-Medical): No  Physical Activity: Not on file  Stress: Not on file  Social Connections: Not on file  Intimate Partner Violence: Not At Risk (08/24/2022)   Humiliation, Afraid, Rape, and Kick questionnaire    Fear of Current or Ex-Partner: No    Emotionally Abused: No    Physically Abused: No    Sexually Abused: No    FAMILY HISTORY: Family History  Problem Relation Age of Onset   Stroke Mother    Heart disease Mother    Heart attack Father 71   Heart disease Father    Kidney disease Sister     ALLERGIES:  is allergic to other.  MEDICATIONS:  Current Outpatient Medications  Medication Sig Dispense Refill   albuterol (VENTOLIN HFA) 108 (90 Base) MCG/ACT inhaler Inhale 2 puffs into the lungs every 6 (six) hours as needed for wheezing or shortness of breath. 8 g 0   amLODipine (NORVASC) 5 MG tablet Take 1 tablet (5 mg total) by mouth daily. 90 tablet 1   chlorpheniramine-HYDROcodone (TUSSIONEX) 10-8 MG/5ML Take 5 mLs by mouth at bedtime as needed for cough. 140 mL 0   magic mouthwash (multi-ingredient) oral suspension Swish and swallow 5-10 mLs 4 (four) times daily as needed. 480 mL 3   ondansetron (ZOFRAN) 8 MG tablet Take 1 tablet (8 mg total) by mouth every 8 (eight) hours as needed for nausea or vomiting. Start on the third day after cisplatin. 30 tablet 1   prochlorperazine (COMPAZINE) 10 MG tablet Take 1 tablet (10 mg  total) by mouth every 6 (six) hours as needed (Nausea or vomiting). 30 tablet 1   pseudoephedrine-acetaminophen (TYLENOL SINUS) 30-500 MG TABS tablet Take 1 tablet by mouth in the morning and at bedtime.     dexamethasone (DECADRON) 4 MG tablet Take 2 tablets (8 mg) by mouth daily x 3 days starting the day after cisplatin chemotherapy. Take with food. (Patient not taking: Reported on  04/23/2023) 30 tablet 0   loratadine (CLARITIN) 10 MG tablet Take 10 mg by mouth daily. (Patient not taking: Reported on 04/23/2023)     No current facility-administered medications for this visit.    REVIEW OF SYSTEMS:   Pertinent information mentioned in HPI All other systems were reviewed with the patient and are negative.  PHYSICAL EXAMINATION: ECOG PERFORMANCE STATUS: 1 - Symptomatic but completely ambulatory  Vitals:   04/23/23 0941  BP: (!) 144/88  Pulse: 87  Resp: 16  Temp: 97.7 F (36.5 C)  SpO2: 97%      Filed Weights   04/23/23 0941  Weight: 241 lb (109.3 kg)       GENERAL:alert, no distress and comfortable SKIN: skin color, texture, turgor are normal, no rashes or significant lesions EYES: normal, conjunctiva are pink and non-injected, sclera clear OROPHARYNX:no exudate, no erythema and lips, buccal mucosa, and tongue normal  NECK: supple, thyroid normal size, non-tender, without nodularity LYMPH:  no palpable lymphadenopathy in the cervical, axillary or inguinal LUNGS: clear to auscultation and percussion with normal breathing effort HEART: regular rate & rhythm and no murmurs and no lower extremity edema ABDOMEN:abdomen soft, non-tender and normal bowel sounds Musculoskeletal:no cyanosis of digits and no clubbing  PSYCH: alert & oriented x 3 with fluent speech NEURO: no focal motor/sensory deficits  LABORATORY DATA:  I have reviewed the data as listed Lab Results  Component Value Date   WBC 4.3 04/23/2023   HGB 11.5 (L) 04/23/2023   HCT 34.8 (L) 04/23/2023   MCV 116.4  (H) 04/23/2023   PLT 221 04/23/2023   Recent Labs    03/26/23 0841 04/02/23 0824 04/09/23 0856  NA 134* 134* 135  K 3.8 3.7 4.0  CL 104 101 103  CO2 23 24 24   GLUCOSE 103* 103* 100*  BUN 13 13 12   CREATININE 0.58 0.74 0.66  CALCIUM 8.8* 8.9 8.9  GFRNONAA >60 >60 >60  PROT 6.8 7.0 7.0  ALBUMIN 3.6 3.7 3.6  AST 32 33 50*  ALT 25 23 57*  ALKPHOS 110 110 119  BILITOT 0.6 0.7 0.5    RADIOGRAPHIC STUDIES: I have personally reviewed the radiological images as listed and agreed with the findings in the report. No results found.

## 2023-04-23 NOTE — Telephone Encounter (Signed)
 Faxed over another script for Magic Mouthwash to the [facility] just now. The patient didn't know where the pharmacy was so the script that I faxed on 04/09/2023, she never went to pick up. I don't know if anyone was able to point her in the right direction.

## 2023-04-24 ENCOUNTER — Other Ambulatory Visit: Payer: Self-pay

## 2023-04-24 LAB — CANCER ANTIGEN 19-9: CA 19-9: 242 U/mL — ABNORMAL HIGH (ref 0–35)

## 2023-04-24 LAB — T4: T4, Total: 8.4 ug/dL (ref 4.5–12.0)

## 2023-04-26 ENCOUNTER — Encounter: Payer: Self-pay | Admitting: Internal Medicine

## 2023-04-26 ENCOUNTER — Other Ambulatory Visit: Payer: Self-pay

## 2023-04-26 DIAGNOSIS — T451X5A Adverse effect of antineoplastic and immunosuppressive drugs, initial encounter: Secondary | ICD-10-CM

## 2023-04-26 DIAGNOSIS — R509 Fever, unspecified: Secondary | ICD-10-CM

## 2023-04-26 DIAGNOSIS — C221 Intrahepatic bile duct carcinoma: Secondary | ICD-10-CM

## 2023-04-27 ENCOUNTER — Ambulatory Visit
Admission: RE | Admit: 2023-04-27 | Discharge: 2023-04-27 | Disposition: A | Discharge status: Home / Self Care | Source: Ambulatory Visit | Attending: Internal Medicine | Admitting: Internal Medicine

## 2023-04-27 ENCOUNTER — Inpatient Hospital Stay

## 2023-04-27 ENCOUNTER — Telehealth: Payer: Self-pay | Admitting: *Deleted

## 2023-04-27 ENCOUNTER — Other Ambulatory Visit: Payer: Self-pay

## 2023-04-27 ENCOUNTER — Encounter: Payer: Self-pay | Admitting: Internal Medicine

## 2023-04-27 ENCOUNTER — Ambulatory Visit
Admission: RE | Admit: 2023-04-27 | Discharge: 2023-04-27 | Disposition: A | Discharge status: Home / Self Care | Source: Ambulatory Visit | Attending: Internal Medicine

## 2023-04-27 ENCOUNTER — Inpatient Hospital Stay (HOSPITAL_BASED_OUTPATIENT_CLINIC_OR_DEPARTMENT_OTHER): Admitting: Internal Medicine

## 2023-04-27 ENCOUNTER — Inpatient Hospital Stay: Attending: Internal Medicine

## 2023-04-27 VITALS — BP 140/90 | HR 98 | Temp 98.6°F | Resp 16

## 2023-04-27 DIAGNOSIS — R509 Fever, unspecified: Secondary | ICD-10-CM | POA: Diagnosis not present

## 2023-04-27 DIAGNOSIS — C7801 Secondary malignant neoplasm of right lung: Secondary | ICD-10-CM | POA: Insufficient documentation

## 2023-04-27 DIAGNOSIS — R978 Other abnormal tumor markers: Secondary | ICD-10-CM | POA: Insufficient documentation

## 2023-04-27 DIAGNOSIS — R918 Other nonspecific abnormal finding of lung field: Secondary | ICD-10-CM | POA: Diagnosis not present

## 2023-04-27 DIAGNOSIS — C7802 Secondary malignant neoplasm of left lung: Secondary | ICD-10-CM | POA: Insufficient documentation

## 2023-04-27 DIAGNOSIS — C7800 Secondary malignant neoplasm of unspecified lung: Secondary | ICD-10-CM | POA: Insufficient documentation

## 2023-04-27 DIAGNOSIS — D6481 Anemia due to antineoplastic chemotherapy: Secondary | ICD-10-CM | POA: Insufficient documentation

## 2023-04-27 DIAGNOSIS — Z5111 Encounter for antineoplastic chemotherapy: Secondary | ICD-10-CM | POA: Insufficient documentation

## 2023-04-27 DIAGNOSIS — C221 Intrahepatic bile duct carcinoma: Secondary | ICD-10-CM

## 2023-04-27 DIAGNOSIS — R059 Cough, unspecified: Secondary | ICD-10-CM | POA: Diagnosis not present

## 2023-04-27 DIAGNOSIS — T451X5A Adverse effect of antineoplastic and immunosuppressive drugs, initial encounter: Secondary | ICD-10-CM | POA: Diagnosis not present

## 2023-04-27 DIAGNOSIS — Z79899 Other long term (current) drug therapy: Secondary | ICD-10-CM | POA: Insufficient documentation

## 2023-04-27 DIAGNOSIS — R051 Acute cough: Secondary | ICD-10-CM

## 2023-04-27 LAB — RESPIRATORY PANEL BY PCR

## 2023-04-27 LAB — CBC WITH DIFFERENTIAL (CANCER CENTER ONLY)
Abs Immature Granulocytes: 0.01 10*3/uL (ref 0.00–0.07)
Basophils Absolute: 0 10*3/uL (ref 0.0–0.1)
Basophils Relative: 1 %
Eosinophils Absolute: 0.1 10*3/uL (ref 0.0–0.5)
Eosinophils Relative: 4 %
HCT: 33.7 % — ABNORMAL LOW (ref 36.0–46.0)
Hemoglobin: 11.3 g/dL — ABNORMAL LOW (ref 12.0–15.0)
Immature Granulocytes: 0 %
Lymphocytes Relative: 18 %
Lymphs Abs: 0.5 10*3/uL — ABNORMAL LOW (ref 0.7–4.0)
MCH: 38.2 pg — ABNORMAL HIGH (ref 26.0–34.0)
MCHC: 33.5 g/dL (ref 30.0–36.0)
MCV: 113.9 fL — ABNORMAL HIGH (ref 80.0–100.0)
Monocytes Absolute: 0.1 10*3/uL (ref 0.1–1.0)
Monocytes Relative: 5 %
Neutro Abs: 2.1 10*3/uL (ref 1.7–7.7)
Neutrophils Relative %: 72 %
Platelet Count: 229 10*3/uL (ref 150–400)
RBC: 2.96 MIL/uL — ABNORMAL LOW (ref 3.87–5.11)
RDW: 13.5 % (ref 11.5–15.5)
WBC Count: 2.8 10*3/uL — ABNORMAL LOW (ref 4.0–10.5)
nRBC: 0 % (ref 0.0–0.2)

## 2023-04-27 LAB — CMP (CANCER CENTER ONLY)
ALT: 106 U/L — ABNORMAL HIGH (ref 0–44)
AST: 155 U/L — ABNORMAL HIGH (ref 15–41)
Albumin: 3.7 g/dL (ref 3.5–5.0)
Alkaline Phosphatase: 116 U/L (ref 38–126)
Anion gap: 11 (ref 5–15)
BUN: 11 mg/dL (ref 6–20)
CO2: 24 mmol/L (ref 22–32)
Calcium: 8.8 mg/dL — ABNORMAL LOW (ref 8.9–10.3)
Chloride: 101 mmol/L (ref 98–111)
Creatinine: 0.58 mg/dL (ref 0.44–1.00)
GFR, Estimated: >60 mL/min (ref >60)
Glucose, Bld: 98 mg/dL (ref 70–99)
Potassium: 4 mmol/L (ref 3.5–5.1)
Sodium: 136 mmol/L (ref 135–145)
Total Bilirubin: 0.9 mg/dL (ref 0.0–1.2)
Total Protein: 7.1 g/dL (ref 6.5–8.1)

## 2023-04-27 MED ORDER — PROCHLORPERAZINE MALEATE 10 MG PO TABS: 10.0000 mg | ORAL_TABLET | Freq: Four times a day (QID) | ORAL | 1 refills | Status: DC | PRN

## 2023-04-27 MED ORDER — ONDANSETRON HCL 8 MG PO TABS: 8.0000 mg | ORAL_TABLET | Freq: Three times a day (TID) | ORAL | 1 refills | Status: DC | PRN

## 2023-04-27 NOTE — Telephone Encounter (Signed)
 Patient sent mychart msg to request Rf of compazine. She reported having a "rough weekend with feeling sick, fever, shivers" pt is out of compazine.   I called patient to get further details of her concerns.  Pt reports symptoms of chills, nausea/vomiting started on Friday. Fevers got up to 102 by Sunday. Pt has been taking tylenol and temps have leveled to about 99. Taking compazine as directed, but is now out of the antiemetics. She has been primarily taking compazine for the nausea. Pt is currently at work, but can come to for smc evaluation by 11 today.

## 2023-04-27 NOTE — Progress Notes (Signed)
 [facility] CONSULT NOTE  Patient Care Team: Reubin Milan, MD as PCP - General (Internal Medicine) Glory Buff, RN as Oncology Nurse Navigator Michaelyn Barter, MD as Consulting Physician (Oncology)   CANCER STAGING   Cancer Staging  Cholangiocarcinoma [facility]) Staging form: Intrahepatic Bile Duct, AJCC 8th Edition - Clinical stage from 08/26/2022: Stage IV (cM1) - Signed by Michaelyn Barter, MD on 09/03/2022 Stage prefix: Initial diagnosis   Treatment Cisplatin 25 mg/m/gemcitabine 1000 mg/m/Durvalumab -completed 8 cycles on 03/04/2023 Maintenance Durvalumab/gemcitabine-started 03/26/2023  ASSESSMENT & PLAN:  Vicki Phillips 58 y.o. 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  # Fever # Nausea -Symptoms started Friday evening.  Tmax of 102.  She has been taking Tylenol twice a day.  Temperature last night was 99.8.  Has baseline shortness of breath with no worsening. -WBC 2.8 noted.  ANC is normal.  Mild transaminitis. -Check respiratory panel, chest x-ray. -Patient is eating better since yesterday.  Hold fluids today. -Discussed about postponing the treatment with Gemzar this week.  Patient would like to keep treatment as scheduled and reassess on Friday which I am okay with. -I have advised her to stop taking Tylenol and monitor her temperature to avoid any masking of fevers.  # Intrahepatic Cholangiocarcinoma, stage IV - s/p liver biopsy on 08/26/2022 showed metastatic moderately differentiated adenocarcinoma.  IHC stain positive for CK7 and CK20.  TTF-1, PAX8, GATA3 and CDX2 are negative.  Differentials include cholangiocarcinoma and pancreatic carcinoma.   Plan- -PD-L1 0%.  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 -s/p cisplatin, gemcitabine and Durvalumab every 3 weeks for 8 cycles on 03/04/2023. -Care coordination with Dr. Hulen Luster at [facility].  Recommending extrapolation of  data from keynote 966 and consideration of durvalumab/gemcitabine maintenance. -CA 19-9 242.  Has been slowly trending up.  Will push up for scans sometime in March.  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 -Improving since off steroid.  Will monitor for now.  # Transaminitis -Mild.  Treatment related.  Monitor.  # Chemotherapy related  anemia -Iron panel consistent with anemia of chronic disease - s/p 1 U PRBC on 01/05/23. - s/p Retacrit 40,000 unit (started on 12/18/2022) x 3 doses.   # Shortness of breath -Stable.  Continue with albuterol inhaler  # Right knee pain -MRI right knee showing horizontal tear in the posterior horn median meniscus and moderate-sized Baker's cyst with suspected leak or rupture, tricompartmental osteoarthritis.   -Seen by Raechel Chute.  Had cortisone shot with mild improvement.    # Hypertension -Continue with amlodipine 5 mg daily.   # Cough -Continue with Tussionex as needed.  # Poor appetite -Discussed about nutrition consult and appetite stimulant.  Would like to hold off  # History of sarcoidosis -Management per pulmonary -Never required treatment.  Orders Placed This Encounter  Procedures   Respiratory (~20 pathogens) panel by PCR    Standing Status:   Future    Number of Occurrences:   1    Expected Date:   04/27/2023    Expiration Date:   04/26/2024   DG Chest 2 View    Standing Status:   Future    Expected Date:   04/27/2023    Expiration Date:   04/26/2024    Reason for Exam (SYMPTOM  OR DIAGNOSIS REQUIRED):   fevers; cough. pt on chemotherapy treatment; Cholangiocarcinoma    Preferred imaging location?:   [facility]   RTC  in 1 week for MD visit, labs, Gemzar as scheduled.  The total time spent in the appointment was 30 minutes encounter with patients including review of chart and various tests results, discussions about plan of care and coordination of care plan   All questions were  answered. The patient knows to call the clinic with any problems, questions or concerns. No barriers to learning was detected.  Michaelyn Barter, MD 3/4/202512:05 PM   HISTORY OF PRESENTING ILLNESS:  Vicki Phillips 58 y.o. 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  Patient reports she presented to ED with complaints of chest pain.  Had chest x-ray which showed pulmonary nodules was referred to pulmonary.  Records not available.  Was then seen by Dr. Aundria Rud on 08/05/2022.  CT chest showed innumerable pulmonary nodules of varying sizes throughout the lung.  Largest spiculated appearing nodule of anterior right upper lobe measuring 2 x 2 cm, at least 1 ill-defined hypodense lesion of the anterior left lobe of the liver.  Despite stated history of sarcoidosis, there are no specific or characteristic features of sarcoidosis with respect to distribution, morphology, associated fibrotic change, nor associated mediastinal lymphadenopathy. 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  She has history of sarcoidosis but was never on treatment.  Reports that she does not see doctors on a regular basis because she is had bad experiences.  Interval history Patient seen today as follow-up now on maintenance gemcitabine and Durvalumab combination. She started having symptoms of fevers Friday evening.  Has been having nausea with poor p.o. intake.  Tmax of 102.  She is taking Tylenol twice a day.  Her temperature last night was 99.8.  She is denying any worsening of her shortness of breath from baseline.  Denies any urinary symptoms.   I have reviewed her chart and materials related to her cancer extensively and collaborated history with the patient. Summary of oncologic  history is as follows: Oncology History  Cholangiocarcinoma (HCC)  08/26/2022 Cancer Staging   Staging form: Intrahepatic Bile Duct, AJCC 8th Edition - Clinical stage from 08/26/2022: Stage IV (cM1) - Signed by Michaelyn Barter, MD on 09/03/2022 Stage prefix: Initial diagnosis   09/03/2022 Initial Diagnosis   Cholangiocarcinoma (HCC)   09/18/2022 -  Chemotherapy   Patient is on Treatment Plan : BILIARY TRACT Cisplatin + Gemcitabine D1,8 + Durvalumab (1500) D1 q21d / Durvalumab (1500) q28d     Metastasis to lung (HCC)  09/03/2022 Initial Diagnosis   Metastasis to lung (HCC)   09/18/2022 -  Chemotherapy   Patient is on Treatment Plan : BILIARY TRACT Cisplatin + Gemcitabine D1,8 + Durvalumab (1500) D1 q21d / Durvalumab (1500) q28d       MEDICAL HISTORY:  Past Medical History:  Diagnosis Date   Allergy    Bile duct cancer (HCC)    Sarcoidosis 2011    SURGICAL HISTORY: Past Surgical History:  Procedure Laterality Date   CESAREAN SECTION     x 2   COLONOSCOPY WITH PROPOFOL N/A 08/10/2019   Procedure: COLONOSCOPY WITH PROPOFOL;  Surgeon: Midge Minium, MD;  Location: [facility];  Service: Endoscopy;  Laterality: N/A;  priority 4   IR IMAGING GUIDED PORT INSERTION  09/11/2022   LUNG BIOPSY      SOCIAL HISTORY: Social History   Socioeconomic History  Marital status: Single    Spouse name: Not on file   Number of children: 2   Years of education: Not on file   Highest education level: Not on file  Occupational History   Not on file  Tobacco Use   Smoking status: Never   Smokeless tobacco: Never  Vaping Use   Vaping status: Never Used  Substance and Sexual Activity   Alcohol use: Not Currently    Alcohol/week: 4.0 standard drinks of alcohol    Types: 4 Glasses of wine per week   Drug use: No   Sexual activity: Yes    Partners: Male    Birth control/protection: Surgical  Other Topics Concern   Not on file  Social History Narrative   Not on file   Social Drivers  of Health   Financial Resource Strain: Low Risk  (09/18/2022)   Overall Financial Resource Strain (CARDIA)    Difficulty of Paying Living Expenses: Not hard at all  Food Insecurity: No Food Insecurity (08/24/2022)   Hunger Vital Sign    Worried About Running Out of Food in the Last Year: Never true    Ran Out of Food in the Last Year: Never true  Transportation Needs: No Transportation Needs (08/24/2022)   PRAPARE - Administrator, Civil Service (Medical): No    Lack of Transportation (Non-Medical): No  Physical Activity: Not on file  Stress: Not on file  Social Connections: Not on file  Intimate Partner Violence: Not At Risk (08/24/2022)   Humiliation, Afraid, Rape, and Kick questionnaire    Fear of Current or Ex-Partner: No    Emotionally Abused: No    Physically Abused: No    Sexually Abused: No    FAMILY HISTORY: Family History  Problem Relation Age of Onset   Stroke Mother    Heart disease Mother    Heart attack Father 62   Heart disease Father    Kidney disease Sister     ALLERGIES:  is allergic to other.  MEDICATIONS:  Current Outpatient Medications  Medication Sig Dispense Refill   albuterol (VENTOLIN HFA) 108 (90 Base) MCG/ACT inhaler Inhale 2 puffs into the lungs every 6 (six) hours as needed for wheezing or shortness of breath. 8 g 0   amLODipine (NORVASC) 5 MG tablet Take 1 tablet (5 mg total) by mouth daily. 90 tablet 1   chlorpheniramine-HYDROcodone (TUSSIONEX) 10-8 MG/5ML Take 5 mLs by mouth at bedtime as needed for cough. 140 mL 0   dexamethasone (DECADRON) 4 MG tablet Take 2 tablets (8 mg) by mouth daily x 3 days starting the day after cisplatin chemotherapy. Take with food. (Patient not taking: Reported on 04/23/2023) 30 tablet 0   loratadine (CLARITIN) 10 MG tablet Take 10 mg by mouth daily. (Patient not taking: Reported on 04/23/2023)     magic mouthwash (multi-ingredient) oral suspension Swish and swallow 5-10 mLs 4 (four) times daily as needed. 480  mL 3   magic mouthwash (multi-ingredient) oral suspension Swish and swallow 5-10 mLs 4 (four) times daily as needed. 480 mL 3   ondansetron (ZOFRAN) 8 MG tablet Take 1 tablet (8 mg total) by mouth every 8 (eight) hours as needed for nausea or vomiting. 30 tablet 1   prochlorperazine (COMPAZINE) 10 MG tablet Take 1 tablet (10 mg total) by mouth every 6 (six) hours as needed (Nausea or vomiting). 30 tablet 1   pseudoephedrine-acetaminophen (TYLENOL SINUS) 30-500 MG TABS tablet Take 1 tablet by mouth in the morning and  at bedtime.     No current facility-administered medications for this visit.    REVIEW OF SYSTEMS:   Pertinent information mentioned in HPI All other systems were reviewed with the patient and are negative.  PHYSICAL EXAMINATION: ECOG PERFORMANCE STATUS: 1 - Symptomatic but completely ambulatory  Vitals:   04/27/23 1130  BP: (!) 140/90  Pulse: 98  Resp: 16  Temp: 98.6 F (37 C)  SpO2: 98%      There were no vitals filed for this visit.      GENERAL:alert, no distress and comfortable SKIN: skin color, texture, turgor are normal, no rashes or significant lesions EYES: normal, conjunctiva are pink and non-injected, sclera clear OROPHARYNX:no exudate, no erythema and lips, buccal mucosa, and tongue normal  NECK: supple, thyroid normal size, non-tender, without nodularity LYMPH:  no palpable lymphadenopathy in the cervical, axillary or inguinal LUNGS: clear to auscultation and percussion with normal breathing effort HEART: regular rate & rhythm and no murmurs and no lower extremity edema ABDOMEN:abdomen soft, non-tender and normal bowel sounds Musculoskeletal:no cyanosis of digits and no clubbing  PSYCH: alert & oriented x 3 with fluent speech NEURO: no focal motor/sensory deficits  LABORATORY DATA:  I have reviewed the data as listed Lab Results  Component Value Date   WBC 2.8 (L) 04/27/2023   HGB 11.3 (L) 04/27/2023   HCT 33.7 (L) 04/27/2023   MCV 113.9  (H) 04/27/2023   PLT 229 04/27/2023   Recent Labs    04/09/23 0856 04/23/23 0925 04/27/23 1114  NA 135 140 136  K 4.0 3.8 4.0  CL 103 105 101  CO2 24 24 24   GLUCOSE 100* 106* 98  BUN 12 12 11   CREATININE 0.66 0.62 0.58  CALCIUM 8.9 9.0 8.8*  GFRNONAA >60 >60 >60  PROT 7.0 7.0 7.1  ALBUMIN 3.6 3.9 3.7  AST 50* 41 155*  ALT 57* 34 106*  ALKPHOS 119 111 116  BILITOT 0.5 0.5 0.9    RADIOGRAPHIC STUDIES: I have personally reviewed the radiological images as listed and agreed with the findings in the report. No results found.

## 2023-04-27 NOTE — Telephone Encounter (Signed)
 Spoke to pt via telephone. See phone note.

## 2023-04-30 ENCOUNTER — Inpatient Hospital Stay

## 2023-04-30 ENCOUNTER — Inpatient Hospital Stay: Admitting: Internal Medicine

## 2023-04-30 ENCOUNTER — Inpatient Hospital Stay: Payer: BC Managed Care – PPO

## 2023-04-30 ENCOUNTER — Encounter: Payer: Self-pay | Admitting: Internal Medicine

## 2023-04-30 VITALS — BP 151/94 | HR 97

## 2023-04-30 VITALS — BP 140/90 | HR 94 | Temp 98.0°F | Resp 14 | Wt 254.0 lb

## 2023-04-30 DIAGNOSIS — C221 Intrahepatic bile duct carcinoma: Secondary | ICD-10-CM | POA: Diagnosis not present

## 2023-04-30 DIAGNOSIS — C7801 Secondary malignant neoplasm of right lung: Secondary | ICD-10-CM | POA: Diagnosis not present

## 2023-04-30 DIAGNOSIS — Z5111 Encounter for antineoplastic chemotherapy: Secondary | ICD-10-CM | POA: Diagnosis not present

## 2023-04-30 DIAGNOSIS — C7802 Secondary malignant neoplasm of left lung: Secondary | ICD-10-CM

## 2023-04-30 DIAGNOSIS — Z79899 Other long term (current) drug therapy: Secondary | ICD-10-CM | POA: Diagnosis not present

## 2023-04-30 DIAGNOSIS — C7800 Secondary malignant neoplasm of unspecified lung: Secondary | ICD-10-CM | POA: Diagnosis not present

## 2023-04-30 DIAGNOSIS — T451X5A Adverse effect of antineoplastic and immunosuppressive drugs, initial encounter: Secondary | ICD-10-CM | POA: Diagnosis not present

## 2023-04-30 DIAGNOSIS — D6481 Anemia due to antineoplastic chemotherapy: Secondary | ICD-10-CM | POA: Diagnosis not present

## 2023-04-30 DIAGNOSIS — R978 Other abnormal tumor markers: Secondary | ICD-10-CM | POA: Diagnosis not present

## 2023-04-30 LAB — CMP (CANCER CENTER ONLY)
ALT: 78 U/L — ABNORMAL HIGH (ref 0–44)
AST: 85 U/L — ABNORMAL HIGH (ref 15–41)
Albumin: 3.9 g/dL (ref 3.5–5.0)
Alkaline Phosphatase: 143 U/L — ABNORMAL HIGH (ref 38–126)
Anion gap: 11 (ref 5–15)
BUN: 10 mg/dL (ref 6–20)
CO2: 25 mmol/L (ref 22–32)
Calcium: 9 mg/dL (ref 8.9–10.3)
Chloride: 101 mmol/L (ref 98–111)
Creatinine: 0.79 mg/dL (ref 0.44–1.00)
GFR, Estimated: >60 mL/min (ref >60)
Glucose, Bld: 95 mg/dL (ref 70–99)
Potassium: 3.8 mmol/L (ref 3.5–5.1)
Sodium: 137 mmol/L (ref 135–145)
Total Bilirubin: 0.8 mg/dL (ref 0.0–1.2)
Total Protein: 7.4 g/dL (ref 6.5–8.1)

## 2023-04-30 LAB — CBC WITH DIFFERENTIAL (CANCER CENTER ONLY)
Abs Immature Granulocytes: 0.02 10*3/uL (ref 0.00–0.07)
Basophils Absolute: 0 10*3/uL (ref 0.0–0.1)
Basophils Relative: 0 %
Eosinophils Absolute: 0 10*3/uL (ref 0.0–0.5)
Eosinophils Relative: 1 %
HCT: 35.4 % — ABNORMAL LOW (ref 36.0–46.0)
Hemoglobin: 11.7 g/dL — ABNORMAL LOW (ref 12.0–15.0)
Immature Granulocytes: 1 %
Lymphocytes Relative: 30 %
Lymphs Abs: 1.1 10*3/uL (ref 0.7–4.0)
MCH: 37.4 pg — ABNORMAL HIGH (ref 26.0–34.0)
MCHC: 33.1 g/dL (ref 30.0–36.0)
MCV: 113.1 fL — ABNORMAL HIGH (ref 80.0–100.0)
Monocytes Absolute: 0.4 10*3/uL (ref 0.1–1.0)
Monocytes Relative: 12 %
Neutro Abs: 2.1 10*3/uL (ref 1.7–7.7)
Neutrophils Relative %: 56 %
Platelet Count: 168 10*3/uL (ref 150–400)
RBC: 3.13 MIL/uL — ABNORMAL LOW (ref 3.87–5.11)
RDW: 13.5 % (ref 11.5–15.5)
WBC Count: 3.7 10*3/uL — ABNORMAL LOW (ref 4.0–10.5)
nRBC: 0 % (ref 0.0–0.2)

## 2023-04-30 MED ORDER — SODIUM CHLORIDE 0.9 % IV SOLN
Freq: Once | INTRAVENOUS | Status: AC
  Filled 2023-04-30: qty 250

## 2023-04-30 MED ORDER — SODIUM CHLORIDE 0.9 % IV SOLN
1000.0000 mg/m2 | Freq: Once | INTRAVENOUS | Status: AC
  Administered 2023-04-30: 2318 mg via INTRAVENOUS
  Filled 2023-04-30: qty 60.96

## 2023-04-30 MED ORDER — PROCHLORPERAZINE MALEATE 10 MG PO TABS
10.0000 mg | ORAL_TABLET | Freq: Once | ORAL | Status: AC
  Administered 2023-04-30: 10 mg via ORAL
  Filled 2023-04-30: qty 1

## 2023-04-30 MED ORDER — HEPARIN SOD (PORK) LOCK FLUSH 100 UNIT/ML IV SOLN
500.0000 [IU] | Freq: Once | INTRAVENOUS | Status: AC | PRN
  Administered 2023-04-30: 500 [IU]
  Filled 2023-04-30: qty 5

## 2023-04-30 NOTE — Patient Instructions (Signed)
 [facility] - A DEPT OF [facility]  Discharge Instructions: Thank you for choosing [facility] to provide your oncology and hematology care.  If you have a lab appointment with the Cancer Center, please go directly to the Cancer Center and check in at the registration area.  Wear comfortable clothing and clothing appropriate for easy access to any Portacath or PICC line.   We strive to give you quality time with your provider. You may need to reschedule your appointment if you arrive late (15 or more minutes).  Arriving late affects you and other patients whose appointments are after yours.  Also, if you miss three or more appointments without notifying the office, you may be dismissed from the clinic at the provider's discretion.      For prescription refill requests, have your pharmacy contact our office and allow 72 hours for refills to be completed.    Today you received the following chemotherapy and/or immunotherapy agents Gemzar      To help prevent nausea and vomiting after your treatment, we encourage you to take your nausea medication as directed.  BELOW ARE SYMPTOMS THAT SHOULD BE REPORTED IMMEDIATELY: *FEVER GREATER THAN 100.4 F (38 C) OR HIGHER *CHILLS OR SWEATING *NAUSEA AND VOMITING THAT IS NOT CONTROLLED WITH YOUR NAUSEA MEDICATION *UNUSUAL SHORTNESS OF BREATH *UNUSUAL BRUISING OR BLEEDING *URINARY PROBLEMS (pain or burning when urinating, or frequent urination) *BOWEL PROBLEMS (unusual diarrhea, constipation, pain near the anus) TENDERNESS IN MOUTH AND THROAT WITH OR WITHOUT PRESENCE OF ULCERS (sore throat, sores in mouth, or a toothache) UNUSUAL RASH, SWELLING OR PAIN  UNUSUAL VAGINAL DISCHARGE OR ITCHING   Items with * indicate a potential emergency and should be followed up as soon as possible or go to the Emergency Department if any problems should occur.  Please show the CHEMOTHERAPY ALERT CARD or IMMUNOTHERAPY ALERT  CARD at check-in to the Emergency Department and triage nurse.  Should you have questions after your visit or need to cancel or reschedule your appointment, please contact [facility] - A DEPT OF [facility]  [phone] and follow the prompts.  Office hours are 8:00 a.m. to 4:30 p.m. Monday - Friday. Please note that voicemails left after 4:00 p.m. may not be returned until the following business day.  We are closed weekends and major holidays. You have access to a nurse at all times for urgent questions. Please call the main number to the clinic [phone] and follow the prompts.  For any non-urgent questions, you may also contact your provider using MyChart. We now offer e-Visits for anyone 60 and older to request care online for non-urgent symptoms. For details visit [email or web address].   Also download the MyChart app! Go to the app store, search "MyChart", open the app, select [place], and log in with your MyChart username and password.

## 2023-04-30 NOTE — Progress Notes (Signed)
 No blood return noted during port labs. Dr. Alena Bills aware and per MD, ok to proceed with Gemzar administration despite no blood return. Port flushes easily.

## 2023-04-30 NOTE — Progress Notes (Signed)
 [facility] CONSULT NOTE  Patient Care Team: Reubin Milan, MD as PCP - General (Internal Medicine) Glory Buff, RN as Oncology Nurse Navigator Michaelyn Barter, MD as Consulting Physician (Oncology)   CANCER STAGING   Cancer Staging  Cholangiocarcinoma [facility]) Staging form: Intrahepatic Bile Duct, AJCC 8th Edition - Clinical stage from 08/26/2022: Stage IV (cM1) - Signed by Michaelyn Barter, MD on 09/03/2022 Stage prefix: Initial diagnosis   Treatment Cisplatin 25 mg/m/gemcitabine 1000 mg/m/Durvalumab -completed 8 cycles on 03/04/2023 Maintenance Durvalumab/gemcitabine-started 03/26/2023  ASSESSMENT & PLAN:  Vicki Phillips 58 y.o. 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  # Intrahepatic Cholangiocarcinoma, stage IV - s/p liver biopsy on 08/26/2022 showed metastatic moderately differentiated adenocarcinoma.  IHC stain positive for CK7 and CK20.  TTF-1, PAX8, GATA3 and CDX2 are negative.  Differentials include cholangiocarcinoma and pancreatic carcinoma.   Plan- -PD-L1 0%.  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 -s/p cisplatin, gemcitabine and Durvalumab every 3 weeks for 8 cycles on 03/04/2023. -Care coordination with Dr. Hulen Luster at [facility].  Recommending extrapolation of data from keynote 966 and consideration of durvalumab/gemcitabine maintenance. -Last week patient developed fever and nausea.  Respiratory viral panel was negative.  Chest x-ray with no evidence of pneumonia.  No more fevers.  She has been feeling better.  Raises a concern about possibility of Gemzar induced fever. -Labs reviewed and acceptable for treatment.  AST and ALT are trending down.  Total bili is normal.  Will proceed with day 8 of Gemzar 1000 mg/m today.  Follow-up in 2 weeks for Durvalumab and gemcitabine. -CA 19-9 242.  Has been slowly trending up.  We have moved up her CT imaging to March 14.  #  Cushingoid features -Likely iatrogenic from Decadron use with chemotherapy. -Improving since off steroid.  Will monitor for now.  # Transaminitis -Mild.  Treatment related.  Monitor.  # Chemotherapy related  anemia -Iron panel consistent with anemia of chronic disease - s/p 1 U PRBC on 01/05/23. - s/p Retacrit 40,000 unit (started on 12/18/2022) x 3 doses.   # Shortness of breath -Stable.  Continue with albuterol inhaler  # Right knee pain -MRI right knee showing horizontal tear in the posterior horn median meniscus and moderate-sized Baker's cyst with suspected leak or rupture, tricompartmental osteoarthritis.   -Seen by Raechel Chute.  Had cortisone shot with mild improvement.    # Hypertension -Continue with amlodipine 5 mg daily.   # Cough -Continue with Tussionex as needed.  # Poor appetite -Discussed about nutrition consult and appetite stimulant.  Would like to hold off  # History of sarcoidosis -Management per pulmonary -Never required treatment.  Orders Placed This Encounter  Procedures   CBC with Differential (Cancer Center Only)    Standing Status:   Future    Expected Date:   06/18/2023    Expiration Date:   06/17/2024   CMP (Cancer Center only)    Standing Status:   Future    Expected Date:   06/18/2023    Expiration Date:   06/17/2024   T4    Standing Status:   Future    Expected Date:   06/18/2023    Expiration Date:   06/17/2024   TSH    Standing Status:   Future    Expected Date:   06/18/2023    Expiration Date:   06/17/2024   CBC with Differential (Cancer Center Only)    Standing Status:  Future    Expected Date:   06/25/2023    Expiration Date:   06/24/2024   CMP (Cancer Center only)    Standing Status:   Future    Expected Date:   06/25/2023    Expiration Date:   06/24/2024   RTC in 2 weeks for MD visit, labs, Durvalumab and gemcitabine.  The total time spent in the appointment was 30 minutes encounter with patients including review of chart and various  tests results, discussions about plan of care and coordination of care plan   All questions were answered. The patient knows to call the clinic with any problems, questions or concerns. No barriers to learning was detected.  Michaelyn Barter, MD 3/7/20252:54 PM   HISTORY OF PRESENTING ILLNESS:  Vicki Phillips 58 y.o. 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  Patient reports she presented to ED with complaints of chest pain.  Had chest x-ray which showed pulmonary nodules was referred to pulmonary.  Records not available.  Was then seen by Dr. Aundria Rud on 08/05/2022.  CT chest showed innumerable pulmonary nodules of varying sizes throughout the lung.  Largest spiculated appearing nodule of anterior right upper lobe measuring 2 x 2 cm, at least 1 ill-defined hypodense lesion of the anterior left lobe of the liver.  Despite stated history of sarcoidosis, there are no specific or characteristic features of sarcoidosis with respect to distribution, morphology, associated fibrotic change, nor associated mediastinal lymphadenopathy. 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  She has history of sarcoidosis but was never on treatment.  Reports that she does not see doctors on a regular basis because she is had bad experiences.  Interval history Patient seen today as follow-up now on maintenance gemcitabine and Durvalumab combination. Patient is feeling much better today.  No more fevers.  Continues to have chronic shortness of breath which is unchanged.  Chronic cough which has worsened.   I have reviewed her chart and materials related to her cancer extensively and collaborated history with the patient. Summary of oncologic history is as follows: Oncology  History  Cholangiocarcinoma (HCC)  08/26/2022 Cancer Staging   Staging form: Intrahepatic Bile Duct, AJCC 8th Edition - Clinical stage from 08/26/2022: Stage IV (cM1) - Signed by Michaelyn Barter, MD on 09/03/2022 Stage prefix: Initial diagnosis   09/03/2022 Initial Diagnosis   Cholangiocarcinoma (HCC)   09/18/2022 -  Chemotherapy   Patient is on Treatment Plan : BILIARY TRACT Cisplatin + Gemcitabine D1,8 + Durvalumab (1500) D1 q21d / Durvalumab (1500) q28d     Metastasis to lung (HCC)  09/03/2022 Initial Diagnosis   Metastasis to lung (HCC)   09/18/2022 -  Chemotherapy   Patient is on Treatment Plan : BILIARY TRACT Cisplatin + Gemcitabine D1,8 + Durvalumab (1500) D1 q21d / Durvalumab (1500) q28d       MEDICAL HISTORY:  Past Medical History:  Diagnosis Date   Allergy    Bile duct cancer (HCC)    Sarcoidosis 2011    SURGICAL HISTORY: Past Surgical History:  Procedure Laterality Date   CESAREAN SECTION     x 2   COLONOSCOPY WITH PROPOFOL N/A 08/10/2019   Procedure: COLONOSCOPY WITH PROPOFOL;  Surgeon: Midge Minium, MD;  Location: [facility];  Service: Endoscopy;  Laterality: N/A;  priority 4   IR IMAGING  GUIDED PORT INSERTION  09/11/2022   LUNG BIOPSY      SOCIAL HISTORY: Social History   Socioeconomic History   Marital status: Single    Spouse name: Not on file   Number of children: 2   Years of education: Not on file   Highest education level: Not on file  Occupational History   Not on file  Tobacco Use   Smoking status: Never   Smokeless tobacco: Never  Vaping Use   Vaping status: Never Used  Substance and Sexual Activity   Alcohol use: Not Currently    Alcohol/week: 4.0 standard drinks of alcohol    Types: 4 Glasses of wine per week   Drug use: No   Sexual activity: Yes    Partners: Male    Birth control/protection: Surgical  Other Topics Concern   Not on file  Social History Narrative   Not on file   Social Drivers of Health   Financial Resource  Strain: Low Risk  (09/18/2022)   Overall Financial Resource Strain (CARDIA)    Difficulty of Paying Living Expenses: Not hard at all  Food Insecurity: No Food Insecurity (08/24/2022)   Hunger Vital Sign    Worried About Running Out of Food in the Last Year: Never true    Ran Out of Food in the Last Year: Never true  Transportation Needs: No Transportation Needs (08/24/2022)   PRAPARE - Administrator, Civil Service (Medical): No    Lack of Transportation (Non-Medical): No  Physical Activity: Not on file  Stress: Not on file  Social Connections: Not on file  Intimate Partner Violence: Not At Risk (08/24/2022)   Humiliation, Afraid, Rape, and Kick questionnaire    Fear of Current or Ex-Partner: No    Emotionally Abused: No    Physically Abused: No    Sexually Abused: No    FAMILY HISTORY: Family History  Problem Relation Age of Onset   Stroke Mother    Heart disease Mother    Heart attack Father 39   Heart disease Father    Kidney disease Sister     ALLERGIES:  is allergic to other.  MEDICATIONS:  Current Outpatient Medications  Medication Sig Dispense Refill   albuterol (VENTOLIN HFA) 108 (90 Base) MCG/ACT inhaler Inhale 2 puffs into the lungs every 6 (six) hours as needed for wheezing or shortness of breath. 8 g 0   amLODipine (NORVASC) 5 MG tablet Take 1 tablet (5 mg total) by mouth daily. 90 tablet 1   chlorpheniramine-HYDROcodone (TUSSIONEX) 10-8 MG/5ML Take 5 mLs by mouth at bedtime as needed for cough. 140 mL 0   dexamethasone (DECADRON) 4 MG tablet Take 2 tablets (8 mg) by mouth daily x 3 days starting the day after cisplatin chemotherapy. Take with food. (Patient not taking: Reported on 04/23/2023) 30 tablet 0   loratadine (CLARITIN) 10 MG tablet Take 10 mg by mouth daily. (Patient not taking: Reported on 04/23/2023)     magic mouthwash (multi-ingredient) oral suspension Swish and swallow 5-10 mLs 4 (four) times daily as needed. 480 mL 3   magic mouthwash  (multi-ingredient) oral suspension Swish and swallow 5-10 mLs 4 (four) times daily as needed. 480 mL 3   ondansetron (ZOFRAN) 8 MG tablet Take 1 tablet (8 mg total) by mouth every 8 (eight) hours as needed for nausea or vomiting. 30 tablet 1   prochlorperazine (COMPAZINE) 10 MG tablet Take 1 tablet (10 mg total) by mouth every 6 (six) hours as needed (  Nausea or vomiting). 30 tablet 1   pseudoephedrine-acetaminophen (TYLENOL SINUS) 30-500 MG TABS tablet Take 1 tablet by mouth in the morning and at bedtime.     No current facility-administered medications for this visit.    REVIEW OF SYSTEMS:   Pertinent information mentioned in HPI All other systems were reviewed with the patient and are negative.  PHYSICAL EXAMINATION: ECOG PERFORMANCE STATUS: 1 - Symptomatic but completely ambulatory  Vitals:   04/30/23 1418  BP: (!) 140/90  Pulse: 94  Resp: 14  Temp: 98 F (36.7 C)  SpO2: 98%      Filed Weights   04/30/23 1418  Weight: 254 lb (115.2 kg)        GENERAL:alert, no distress and comfortable SKIN: skin color, texture, turgor are normal, no rashes or significant lesions EYES: normal, conjunctiva are pink and non-injected, sclera clear OROPHARYNX:no exudate, no erythema and lips, buccal mucosa, and tongue normal  NECK: supple, thyroid normal size, non-tender, without nodularity LYMPH:  no palpable lymphadenopathy in the cervical, axillary or inguinal LUNGS: clear to auscultation and percussion with normal breathing effort HEART: regular rate & rhythm and no murmurs and no lower extremity edema ABDOMEN:abdomen soft, non-tender and normal bowel sounds Musculoskeletal:no cyanosis of digits and no clubbing  PSYCH: alert & oriented x 3 with fluent speech NEURO: no focal motor/sensory deficits  LABORATORY DATA:  I have reviewed the data as listed Lab Results  Component Value Date   WBC 3.7 (L) 04/30/2023   HGB 11.7 (L) 04/30/2023   HCT 35.4 (L) 04/30/2023   MCV 113.1 (H)  04/30/2023   PLT 168 04/30/2023   Recent Labs    04/23/23 0925 04/27/23 1114 04/30/23 1342  NA 140 136 137  K 3.8 4.0 3.8  CL 105 101 101  CO2 24 24 25   GLUCOSE 106* 98 95  BUN 12 11 10   CREATININE 0.62 0.58 0.79  CALCIUM 9.0 8.8* 9.0  GFRNONAA >60 >60 >60  PROT 7.0 7.1 7.4  ALBUMIN 3.9 3.7 3.9  AST 41 155* 85*  ALT 34 106* 78*  ALKPHOS 111 116 143*  BILITOT 0.5 0.9 0.8    RADIOGRAPHIC STUDIES: I have personally reviewed the radiological images as listed and agreed with the findings in the report. DG Chest 2 View Result Date: 04/27/2023 CLINICAL DATA:  Cough, fever. History of metastatic cholangiocarcinoma. EXAM: CHEST - 2 VIEW COMPARISON:  October 29, 2022. FINDINGS: The heart size and mediastinal contours are within normal limits. Right internal jugular Port-A-Cath is unchanged. Grossly stable bilateral pulmonary nodules are noted consistent with metastatic disease. No definite consolidative process. The visualized skeletal structures are unremarkable. IMPRESSION: Stable bilateral pulmonary nodules consistent with metastatic disease. No other significant pulmonary abnormality seen. Electronically Signed   By: Lupita Raider M.D.   On: 04/27/2023 18:22

## 2023-04-30 NOTE — Progress Notes (Signed)
 Patient had X-ray on 04/27/2023. Her cough has got worse

## 2023-05-01 ENCOUNTER — Other Ambulatory Visit: Payer: Self-pay

## 2023-05-07 ENCOUNTER — Ambulatory Visit (HOSPITAL_COMMUNITY)

## 2023-05-10 ENCOUNTER — Other Ambulatory Visit: Payer: Self-pay

## 2023-05-10 DIAGNOSIS — C221 Intrahepatic bile duct carcinoma: Secondary | ICD-10-CM

## 2023-05-10 DIAGNOSIS — C7801 Secondary malignant neoplasm of right lung: Secondary | ICD-10-CM

## 2023-05-10 MED ORDER — PROCHLORPERAZINE MALEATE 10 MG PO TABS: 10.0000 mg | ORAL_TABLET | Freq: Four times a day (QID) | ORAL | 1 refills | Status: DC | PRN

## 2023-05-11 ENCOUNTER — Ambulatory Visit
Admission: RE | Admit: 2023-05-11 | Discharge: 2023-05-11 | Disposition: A | Discharge status: Home / Self Care | Source: Ambulatory Visit | Attending: Internal Medicine | Admitting: Internal Medicine

## 2023-05-11 DIAGNOSIS — Z5112 Encounter for antineoplastic immunotherapy: Secondary | ICD-10-CM | POA: Diagnosis not present

## 2023-05-11 DIAGNOSIS — C7802 Secondary malignant neoplasm of left lung: Secondary | ICD-10-CM | POA: Insufficient documentation

## 2023-05-11 DIAGNOSIS — C221 Intrahepatic bile duct carcinoma: Secondary | ICD-10-CM | POA: Insufficient documentation

## 2023-05-11 DIAGNOSIS — Z5111 Encounter for antineoplastic chemotherapy: Secondary | ICD-10-CM | POA: Diagnosis not present

## 2023-05-11 DIAGNOSIS — C7801 Secondary malignant neoplasm of right lung: Secondary | ICD-10-CM | POA: Insufficient documentation

## 2023-05-11 DIAGNOSIS — R932 Abnormal findings on diagnostic imaging of liver and biliary tract: Secondary | ICD-10-CM | POA: Diagnosis not present

## 2023-05-11 MED ORDER — IOHEXOL 300 MG/ML  SOLN
100.0000 mL | Freq: Once | INTRAMUSCULAR | Status: AC | PRN
  Administered 2023-05-11: 100 mL via INTRAVENOUS

## 2023-05-13 ENCOUNTER — Encounter: Payer: Self-pay | Admitting: Internal Medicine

## 2023-05-14 ENCOUNTER — Telehealth: Payer: Self-pay | Admitting: *Deleted

## 2023-05-17 ENCOUNTER — Encounter: Payer: Self-pay | Admitting: Internal Medicine

## 2023-05-17 ENCOUNTER — Telehealth: Payer: Self-pay

## 2023-05-17 NOTE — Telephone Encounter (Signed)
 See phone note. Dr. Mervyn Skeeters will wait to next apt to discuss w/pt

## 2023-05-17 NOTE — Telephone Encounter (Signed)
 Called patient and spoke with her about the paper work she wanted Dr. Alena Bills to fill out for insurance purposes. Dr. Alena Bills wanted to know if patient could wait until her next appointment with her in April. She told me that was fine.

## 2023-05-18 ENCOUNTER — Telehealth: Payer: Self-pay | Admitting: *Deleted

## 2023-05-18 NOTE — Telephone Encounter (Signed)
 You can speak to a doctor about results and you have to give there info/ they already sent the papers to the MD and she can call o speak to MD if they want with ref> [id number] and the phone number [phone]

## 2023-05-20 ENCOUNTER — Other Ambulatory Visit: Payer: BC Managed Care – PPO

## 2023-05-20 ENCOUNTER — Ambulatory Visit: Payer: BC Managed Care – PPO | Admitting: Nurse Practitioner

## 2023-05-20 ENCOUNTER — Ambulatory Visit: Payer: BC Managed Care – PPO

## 2023-05-21 ENCOUNTER — Telehealth: Payer: Self-pay | Admitting: Pharmacist

## 2023-05-21 ENCOUNTER — Inpatient Hospital Stay: Admitting: Nurse Practitioner

## 2023-05-21 ENCOUNTER — Inpatient Hospital Stay

## 2023-05-21 ENCOUNTER — Encounter: Payer: Self-pay | Admitting: Nurse Practitioner

## 2023-05-21 VITALS — BP 148/88 | HR 93 | Temp 97.6°F | Resp 16 | Wt 236.0 lb

## 2023-05-21 DIAGNOSIS — T451X5A Adverse effect of antineoplastic and immunosuppressive drugs, initial encounter: Secondary | ICD-10-CM | POA: Diagnosis not present

## 2023-05-21 DIAGNOSIS — Z5111 Encounter for antineoplastic chemotherapy: Secondary | ICD-10-CM | POA: Diagnosis not present

## 2023-05-21 DIAGNOSIS — C7801 Secondary malignant neoplasm of right lung: Secondary | ICD-10-CM

## 2023-05-21 DIAGNOSIS — C7800 Secondary malignant neoplasm of unspecified lung: Secondary | ICD-10-CM | POA: Diagnosis not present

## 2023-05-21 DIAGNOSIS — C7802 Secondary malignant neoplasm of left lung: Secondary | ICD-10-CM | POA: Diagnosis not present

## 2023-05-21 DIAGNOSIS — R978 Other abnormal tumor markers: Secondary | ICD-10-CM | POA: Diagnosis not present

## 2023-05-21 DIAGNOSIS — Z79899 Other long term (current) drug therapy: Secondary | ICD-10-CM | POA: Diagnosis not present

## 2023-05-21 DIAGNOSIS — C221 Intrahepatic bile duct carcinoma: Secondary | ICD-10-CM | POA: Diagnosis not present

## 2023-05-21 DIAGNOSIS — D6481 Anemia due to antineoplastic chemotherapy: Secondary | ICD-10-CM | POA: Diagnosis not present

## 2023-05-21 LAB — CBC WITH DIFFERENTIAL (CANCER CENTER ONLY)
Abs Immature Granulocytes: 0.01 10*3/uL (ref 0.00–0.07)
Basophils Absolute: 0 10*3/uL (ref 0.0–0.1)
Basophils Relative: 1 %
Eosinophils Absolute: 0.1 10*3/uL (ref 0.0–0.5)
Eosinophils Relative: 3 %
HCT: 37.8 % (ref 36.0–46.0)
Hemoglobin: 12.5 g/dL (ref 12.0–15.0)
Immature Granulocytes: 0 %
Lymphocytes Relative: 14 %
Lymphs Abs: 0.5 10*3/uL — ABNORMAL LOW (ref 0.7–4.0)
MCH: 37.9 pg — ABNORMAL HIGH (ref 26.0–34.0)
MCHC: 33.1 g/dL (ref 30.0–36.0)
MCV: 114.5 fL — ABNORMAL HIGH (ref 80.0–100.0)
Monocytes Absolute: 0.5 10*3/uL (ref 0.1–1.0)
Monocytes Relative: 14 %
Neutro Abs: 2.4 10*3/uL (ref 1.7–7.7)
Neutrophils Relative %: 68 %
Platelet Count: 240 10*3/uL (ref 150–400)
RBC: 3.3 MIL/uL — ABNORMAL LOW (ref 3.87–5.11)
RDW: 13.6 % (ref 11.5–15.5)
WBC Count: 3.5 10*3/uL — ABNORMAL LOW (ref 4.0–10.5)
nRBC: 0 % (ref 0.0–0.2)

## 2023-05-21 LAB — CMP (CANCER CENTER ONLY)
ALT: 27 U/L (ref 0–44)
AST: 48 U/L — ABNORMAL HIGH (ref 15–41)
Albumin: 3.9 g/dL (ref 3.5–5.0)
Alkaline Phosphatase: 132 U/L — ABNORMAL HIGH (ref 38–126)
Anion gap: 9 (ref 5–15)
BUN: 10 mg/dL (ref 6–20)
CO2: 24 mmol/L (ref 22–32)
Calcium: 8.8 mg/dL — ABNORMAL LOW (ref 8.9–10.3)
Chloride: 103 mmol/L (ref 98–111)
Creatinine: 0.75 mg/dL (ref 0.44–1.00)
GFR, Estimated: >60 mL/min (ref >60)
Glucose, Bld: 107 mg/dL — ABNORMAL HIGH (ref 70–99)
Potassium: 3.9 mmol/L (ref 3.5–5.1)
Sodium: 136 mmol/L (ref 135–145)
Total Bilirubin: 0.7 mg/dL (ref 0.0–1.2)
Total Protein: 7.4 g/dL (ref 6.5–8.1)

## 2023-05-21 LAB — TSH: TSH: 3.387 u[IU]/mL (ref 0.350–4.500)

## 2023-05-21 MED ORDER — HEPARIN SOD (PORK) LOCK FLUSH 100 UNIT/ML IV SOLN
500.0000 [IU] | Freq: Once | INTRAVENOUS | Status: AC
  Administered 2023-05-21: 500 [IU] via INTRAVENOUS
  Filled 2023-05-21: qty 5

## 2023-05-21 NOTE — Telephone Encounter (Signed)
 Clinical Pharmacist Practitioner Encounter   Received new prescription for Lytgobi (futibatinib) for the treatment of progressive intrahepatic cholangiocarcinoma with and FGFR2 rearrangement,  planned duration until disease progression or unacceptable drug toxicity.  Phosphorus levels will ned to be monitored while on therapy. Prescription dose and frequency assessed.   Current medication list in Epic reviewed, no DDIs with futibatinib identified.   Evaluated chart and no patient barriers to medication adherence identified.   Prescription has been e-scribed to the [facility] for benefits analysis and approval.  Oral Oncology Clinic will continue to follow for insurance authorization, copayment issues, initial counseling and start date.   Remi Haggard, PharmD, BCOP, CPP Hematology/Oncology Clinical Pharmacist [facility] Oral Chemotherapy Navigation Clinic [phone]  05/21/2023 3:58 PM

## 2023-05-21 NOTE — Progress Notes (Signed)
 OFF PATHWAY REGIMEN - Other  No Change  Continue With Treatment as Ordered.  Original Decision Date/Time: 09/04/2022 15:04   [id number]:Cisplatin IV D1,8 + Durvalumab 1,500 mg IV D1 + Gemcitabine IV D1,8 q21 Days for up to 8 Cycles Followed by Durvalumab 1,500 mg IV D1 q28 Days:   Cycles 1 through up to 8: A cycle is every 21 days:     Durvalumab      Gemcitabine      Cisplatin    Cycles 9 and beyond: A cycle is every 28 days:     Durvalumab   **Always confirm dose/schedule in your pharmacy ordering system**  Patient Characteristics: Intent of Therapy: Non-Curative / Palliative Intent, Discussed with Patient

## 2023-05-21 NOTE — Progress Notes (Signed)
 Patient had a scan on 05/11/2023. Her cough has worsened, she is starting to have a weaker appetite due to having so much nausea, which she is taking her zofran. She has found a couple of new chemo therapy drugs that she wanted Dr. Alena Bills to look into if possible, ZIIHERA (zanidatamab-hrii), [facility] therapy.

## 2023-05-21 NOTE — Progress Notes (Signed)
 [facility] CONSULT NOTE  Patient Care Team: Reubin Milan, MD as PCP - General (Internal Medicine) Glory Buff, RN as Oncology Nurse Navigator Michaelyn Barter, MD as Consulting Physician (Oncology)  CANCER STAGING   Cancer Staging  Cholangiocarcinoma [facility]) Staging form: Intrahepatic Bile Duct, AJCC 8th Edition - Clinical stage from 08/26/2022: Stage IV (cM1) - Signed by Michaelyn Barter, MD on 09/03/2022 Stage prefix: Initial diagnosis  Treatment Cisplatin 25 mg/m/gemcitabine 1000 mg/m/Durvalumab -completed 8 cycles on 03/04/2023 Maintenance Durvalumab/gemcitabine-started 03/26/2023  ASSESSMENT & PLAN:  Vicki Phillips 58 y.o. female with pmh of sarcoidosis, not on treatment, was referred to oncology for stage IV intrahepatic cholangiocarcinoma.  # Intrahepatic Cholangiocarcinoma, stage IV - s/p liver biopsy on 08/26/2022 showed metastatic moderately differentiated adenocarcinoma.  IHC stain positive for CK7 and CK20.  TTF-1, PAX8, GATA3 and CDX2 are negative.  Differentials include cholangiocarcinoma and pancreatic carcinoma.  -PD-L1 0%.  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 -s/p cisplatin, gemcitabine and Durvalumab every 3 weeks for 8 cycles on 03/04/2023. -Care coordination with Dr. Hulen Luster at [facility].  Recommending extrapolation of data from keynote 966 and consideration of durvalumab/gemcitabine maintenance. - Currently s/p D8 of maintenance gemcitabine-durvalumab.  - CA 19-9 242.  Has been slowly trending up.   - CT C/A/P 05/11/23 was independently reviewed by myself and with patient. Unfortunately imaging shows progression of disease in the lungs. Otherwise stable and no evidence of new disease.  - Plan to hold gemcitabine today - Given her FGFR2 gene fusion, recommend Futibatinib. We briefly discussed that this is an oral chemotherapy. Today we discussed that this is an oral chemotherapy and we would recommend 20 mg once a day until disease  progression or unacceptable toxicity. We reviewed potential side effects including changes to skin or nails, hair thinning or loss, changes to blood levels, including changes in blood levels and electrolytes including phosphate changes as well as ocular toxicity. Risk of constipation, weight loss, diarrhea, nausea, vomiting, mouth sores, infections, fatigue, aches and pains. Patient verbalizes understanding and wishes to proceed.  - I've reached out to Dr Hulen Luster at [facility] who agrees with recommendation and is also able to see patient if needed. Patient declines - I've reached out to [employer] to assist with oral chemotherapy. Will plan for patient to see Dr Alena Bills back next week.   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 -Improving since off steroid.  Will monitor for now.  # Transaminitis - improved. Treatment related.  Monitor.  # Chemotherapy related  anemia -Iron panel consistent with anemia of chronic disease - s/p 1 U PRBC on 01/05/23. - s/p Retacrit 40,000 unit (started on 12/18/2022) x 3 doses.   # Shortness of breath -Stable.  Continue with albuterol inhaler  # Right knee pain -MRI right knee showing horizontal tear in the posterior horn median meniscus and moderate-sized Baker's cyst with suspected leak or rupture, tricompartmental osteoarthritis.   -Seen by Raechel Chute.  Had cortisone shot with mild improvement.    # Hypertension -Continue with amlodipine 5 mg daily.   # Cough -Continue with Tussionex as needed.  # Poor appetite -Discussed about nutrition consult and appetite stimulant.  Would like to hold off  # History of sarcoidosis -Management per pulmonary -Never required treatment.  # Goals of care - treatment given with palliative intent  No orders of the defined types were placed in this encounter.  Disposition:  Deaccess. No treatment today F/u in 1 week with Dr Alena Bills- [place]  The total time spent in the  appointment was 60 minutes encounter with patients including review of chart and various tests results, discussions about plan of care and coordination of care plan   All questions were answered. The patient knows to call the clinic with any problems, questions or concerns. No barriers to learning was detected.  Alinda Dooms, NP 05/21/2023   HISTORY OF PRESENTING ILLNESS:  Vicki Phillips 58 y.o. 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  Patient reports she presented to ED with complaints of chest pain.  Had chest x-ray which showed pulmonary nodules was referred to pulmonary.  Records not available.  Was then seen by Dr. Aundria Rud on 08/05/2022.  CT chest showed innumerable pulmonary nodules of varying sizes throughout the lung.  Largest spiculated appearing nodule of anterior right upper lobe measuring 2 x 2 cm, at least 1 ill-defined hypodense lesion of the anterior left lobe of the liver.  Despite stated history of sarcoidosis, there are no specific or characteristic features of sarcoidosis with respect to distribution, morphology, associated fibrotic change, nor associated mediastinal lymphadenopathy. 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  She has history of sarcoidosis but was never on treatment.  Reports that she does not see doctors on a regular basis because she is had bad experiences.  Interval history Patient seen today as follow-up now on maintenance gemcitabine and Durvalumab combination. Patient is feeling much better today.  No more fevers.  Continues to have chronic shortness of breath which is unchanged.  Chronic cough which has worsened.   I have reviewed her chart and materials related to her cancer extensively and collaborated  history with the patient. Summary of oncologic history is as follows: Oncology History  Cholangiocarcinoma (HCC)  08/26/2022 Cancer Staging   Staging form: Intrahepatic Bile Duct, AJCC 8th Edition - Clinical stage from 08/26/2022: Stage IV (cM1) - Signed by Michaelyn Barter, MD on 09/03/2022 Stage prefix: Initial diagnosis   09/03/2022 Initial Diagnosis   Cholangiocarcinoma (HCC)   09/18/2022 -  Chemotherapy   Patient is on Treatment Plan : BILIARY TRACT Cisplatin + Gemcitabine D1,8 + Durvalumab (1500) D1 q21d / Durvalumab (1500) q28d     Metastasis to lung (HCC)  09/03/2022 Initial Diagnosis   Metastasis to lung (HCC)   09/18/2022 -  Chemotherapy   Patient is on Treatment Plan : BILIARY TRACT Cisplatin + Gemcitabine D1,8 + Durvalumab (1500) D1 q21d / Durvalumab (1500) q28d       MEDICAL HISTORY:  Past Medical History:  Diagnosis Date   Allergy    Bile duct cancer (HCC)    Sarcoidosis 2011    SURGICAL HISTORY: Past Surgical History:  Procedure Laterality Date   CESAREAN SECTION     x 2   COLONOSCOPY WITH PROPOFOL N/A 08/10/2019   Procedure: COLONOSCOPY WITH PROPOFOL;  Surgeon: Midge Minium, MD;  Location: [facility];  Service: Endoscopy;  Laterality: N/A;  priority 4   IR IMAGING GUIDED PORT INSERTION  09/11/2022   LUNG BIOPSY      SOCIAL HISTORY: Social History   Socioeconomic History   Marital status: Single    Spouse name: Not on file   Number of children: 2   Years of education: Not on file   Highest education level: Not on file  Occupational History  Not on file  Tobacco Use   Smoking status: Never   Smokeless tobacco: Never  Vaping Use   Vaping status: Never Used  Substance and Sexual Activity   Alcohol use: Not Currently    Alcohol/week: 4.0 standard drinks of alcohol    Types: 4 Glasses of wine per week   Drug use: No   Sexual activity: Yes    Partners: Male    Birth control/protection: Surgical  Other Topics Concern   Not on file  Social  History Narrative   Not on file   Social Drivers of Health   Financial Resource Strain: Low Risk  (09/18/2022)   Overall Financial Resource Strain (CARDIA)    Difficulty of Paying Living Expenses: Not hard at all  Food Insecurity: No Food Insecurity (08/24/2022)   Hunger Vital Sign    Worried About Running Out of Food in the Last Year: Never true    Ran Out of Food in the Last Year: Never true  Transportation Needs: No Transportation Needs (08/24/2022)   PRAPARE - Administrator, Civil Service (Medical): No    Lack of Transportation (Non-Medical): No  Physical Activity: Not on file  Stress: Not on file  Social Connections: Not on file  Intimate Partner Violence: Not At Risk (08/24/2022)   Humiliation, Afraid, Rape, and Kick questionnaire    Fear of Current or Ex-Partner: No    Emotionally Abused: No    Physically Abused: No    Sexually Abused: No    FAMILY HISTORY: Family History  Problem Relation Age of Onset   Stroke Mother    Heart disease Mother    Heart attack Father 61   Heart disease Father    Kidney disease Sister     ALLERGIES:  is allergic to other.  MEDICATIONS:  Current Outpatient Medications  Medication Sig Dispense Refill   albuterol (VENTOLIN HFA) 108 (90 Base) MCG/ACT inhaler Inhale 2 puffs into the lungs every 6 (six) hours as needed for wheezing or shortness of breath. 8 g 0   amLODipine (NORVASC) 5 MG tablet Take 1 tablet (5 mg total) by mouth daily. 90 tablet 1   chlorpheniramine-HYDROcodone (TUSSIONEX) 10-8 MG/5ML Take 5 mLs by mouth at bedtime as needed for cough. 140 mL 0   ondansetron (ZOFRAN) 8 MG tablet Take 1 tablet (8 mg total) by mouth every 8 (eight) hours as needed for nausea or vomiting. 30 tablet 1   prochlorperazine (COMPAZINE) 10 MG tablet Take 1 tablet (10 mg total) by mouth every 6 (six) hours as needed (Nausea or vomiting). 30 tablet 1   pseudoephedrine-acetaminophen (TYLENOL SINUS) 30-500 MG TABS tablet Take 1 tablet by mouth  in the morning and at bedtime.     dexamethasone (DECADRON) 4 MG tablet Take 2 tablets (8 mg) by mouth daily x 3 days starting the day after cisplatin chemotherapy. Take with food. (Patient not taking: Reported on 05/21/2023) 30 tablet 0   loratadine (CLARITIN) 10 MG tablet Take 10 mg by mouth daily. (Patient not taking: Reported on 04/09/2023)     No current facility-administered medications for this visit.    REVIEW OF SYSTEMS:   Pertinent information mentioned in HPI All other systems were reviewed with the patient and are negative.  PHYSICAL EXAMINATION: ECOG PERFORMANCE STATUS: 1 - Symptomatic but completely ambulatory  Vitals:   05/21/23 0852  BP: (!) 148/88  Pulse: 93  Resp: 16  Temp: 97.6 F (36.4 C)  SpO2: 97%      Filed  Weights   05/21/23 0852  Weight: 236 lb (107 kg)        GENERAL:alert, no distress and comfortable SKIN: skin color, texture, turgor are normal, no rashes or significant lesions EYES: normal, conjunctiva are pink and non-injected, sclera clear OROPHARYNX:no exudate, no erythema and lips, buccal mucosa, and tongue normal  NECK: supple, thyroid normal size, non-tender, without nodularity LYMPH:  no palpable lymphadenopathy in the cervical, axillary or inguinal LUNGS: clear to auscultation and percussion with normal breathing effort HEART: regular rate & rhythm and no murmurs and no lower extremity edema ABDOMEN:abdomen soft, non-tender and normal bowel sounds Musculoskeletal:no cyanosis of digits and no clubbing  PSYCH: alert & oriented x 3 with fluent speech NEURO: no focal motor/sensory deficits  LABORATORY DATA:  I have reviewed the data as listed Lab Results  Component Value Date   WBC 3.5 (L) 05/21/2023   HGB 12.5 05/21/2023   HCT 37.8 05/21/2023   MCV 114.5 (H) 05/21/2023   PLT 240 05/21/2023   Recent Labs    04/27/23 1114 04/30/23 1342 05/21/23 0838  NA 136 137 136  K 4.0 3.8 3.9  CL 101 101 103  CO2 24 25 24   GLUCOSE 98  95 107*  BUN 11 10 10   CREATININE 0.58 0.79 0.75  CALCIUM 8.8* 9.0 8.8*  GFRNONAA >60 >60 >60  PROT 7.1 7.4 7.4  ALBUMIN 3.7 3.9 3.9  AST 155* 85* 48*  ALT 106* 78* 27  ALKPHOS 116 143* 132*  BILITOT 0.9 0.8 0.7    RADIOGRAPHIC STUDIES: I have personally reviewed the radiological images as listed and agreed with the findings in the report. CT CHEST ABDOMEN PELVIS W CONTRAST Result Date: 05/19/2023 CLINICAL DATA:  Cholangiocarcinoma staging EXAM: CT CHEST, ABDOMEN, AND PELVIS WITH CONTRAST TECHNIQUE: Multidetector CT imaging of the chest, abdomen and pelvis was performed following the standard protocol during bolus administration of intravenous contrast. RADIATION DOSE REDUCTION: This exam was performed according to the departmental dose-optimization program which includes automated exposure control, adjustment of the mA and/or kV according to patient size and/or use of iterative reconstruction technique. CONTRAST:  OMNIPAQUE IOHEXOL 300 MG/ML  SOLN COMPARISON:  CT chest February 26, 2023 CT chest November 30, 2022 FINDINGS: CT CHEST FINDINGS Cardiovascular: No significant vascular findings. Normal heart size. No pericardial effusion. Mediastinum/Nodes: No enlarged mediastinal, hilar, or axillary lymph nodes. Thyroid gland, trachea, and esophagus demonstrate no significant findings. Lungs/Pleura: Comparison with prior examination accounting for differences in techniques there is no significant change in the numerous bilateral pulmonary nodules. The largest of the nodules in the anterior segment of the right upper lobe measures 1.9 x 2.1 cm unchanged since prior examination. The largest of the nodule in the left upper lobe image 51 measures 1.5 x 1.3 cm unchanged since prior examination. The largest of the nodules in the inferior margin of the right upper lobe posterior segment image 63 measures 1.7 x 1.4 cm and appears larger than on prior examination when it measured 1.3 x 1.2 cm. Nodule in the  left upper lobe image 65 measures 2.1 x 1.5 cm, appears larger than prior examination when it measured 1.5 by 1.3 cm Nodule in the right middle lobe measures 1.7 by 1.6 cm and appears larger than prior examination when it measured 1.1 x 1.5 cm Nodule in the anterior margin of the right lower lobe measures 1.8 x 1.9 cm compared with prior examination 1.5 x 1.5 cm. Multiple other nodules are also increased in size since prior examination  and in number. Bilobed nodule of the l right lower lobe measured at 21 mm appears unchanged since prior. Findings correlate with progression of metastatic lung disease. Musculoskeletal: Persistent metastatic lesion within the T3 vertebral body. CT ABDOMEN PELVIS FINDINGS Hepatobiliary: Comparison with prior examination accounting for differences in techniques there is no significant change in the lesion within the medial segment of the left lobe of the liver. Measuring 6.8 by 5.9 cm. With calcifications. The previously described small subcapsular or pericapsular nodule adjacent to the left lobe of the liver is not identified on these images. The previously described 3.6 mm hypodense nodule of the left lobe of the liver appears unchanged since prior examination. The previously described 11 mm hypodense nodule in the right lobe of the liver segment 5 is unchanged since prior examination. No other focal hepatic lesions or biliary dilatation. Gallbladder unremarkable. Pancreas: Unremarkable. No pancreatic ductal dilatation or surrounding inflammatory changes. Spleen: Normal in size without focal abnormality. Adrenals/Urinary Tract: Adrenal glands are unremarkable. Kidneys are normal, without renal calculi, focal lesion, or hydronephrosis. Bladder is unremarkable. Stomach/Bowel: Stomach is within normal limits. Appendix appears normal. No evidence of bowel wall thickening, distention, or inflammatory changes. No change colonic diverticulosis compared with prior examination.  Vascular/Lymphatic: No significant vascular findings are present. No enlarged abdominal or pelvic lymph nodes. Reproductive: Uterus and bilateral adnexa are unremarkable. Other: Stable small anterior abdominal wall umbilical hernia containing fat only. Musculoskeletal: IMPRESSION: *Progression of metastatic lung disease. *Stable metastatic lesion within the T3 vertebral body. *Stable hepatic lesions. *No other significant changes. Electronically Signed   By: Shaaron Adler M.D.   On: 05/19/2023 11:01   DG Chest 2 View Result Date: 04/27/2023 CLINICAL DATA:  Cough, fever. History of metastatic cholangiocarcinoma. EXAM: CHEST - 2 VIEW COMPARISON:  October 29, 2022. FINDINGS: The heart size and mediastinal contours are within normal limits. Right internal jugular Port-A-Cath is unchanged. Grossly stable bilateral pulmonary nodules are noted consistent with metastatic disease. No definite consolidative process. The visualized skeletal structures are unremarkable. IMPRESSION: Stable bilateral pulmonary nodules consistent with metastatic disease. No other significant pulmonary abnormality seen. Electronically Signed   By: Lupita Raider M.D.   On: 04/27/2023 18:22

## 2023-05-21 NOTE — Progress Notes (Signed)
 DISCONTINUE OFF PATHWAY REGIMEN - Other   [id number]:Cisplatin IV D1,8 + Durvalumab 1,500 mg IV D1 + Gemcitabine IV D1,8 q21 Days for up to 8 Cycles Followed by Durvalumab 1,500 mg IV D1 q28 Days:   Cycles 1 through up to 8: A cycle is every 21 days:     Durvalumab      Gemcitabine      Cisplatin    Cycles 9 and beyond: A cycle is every 28 days:     Durvalumab   **Always confirm dose/schedule in your pharmacy ordering system**  PRIOR TREATMENT: Cisplatin IV D1,8 + Durvalumab 1,500 mg IV D1 + Gemcitabine IV D1,8 q21 Days for up to 8 Cycles Followed by Durvalumab 1,500 mg IV D1 q28 Days  START OFF PATHWAY REGIMEN - Other   [id number]:Futibatinib 20 mg PO Daily D1-28 q28 Days:   A cycle is every 28 days:     Futibatinib   **Always confirm dose/schedule in your pharmacy ordering system**  Patient Characteristics: Intent of Therapy: Non-Curative / Palliative Intent, Discussed with Patient

## 2023-05-22 LAB — T4: T4, Total: 8.9 ug/dL (ref 4.5–12.0)

## 2023-05-22 LAB — CANCER ANTIGEN 19-9: CA 19-9: 395 U/mL — ABNORMAL HIGH (ref 0–35)

## 2023-05-24 ENCOUNTER — Telehealth: Payer: Self-pay | Admitting: Pharmacy Technician

## 2023-05-24 ENCOUNTER — Other Ambulatory Visit: Payer: Self-pay

## 2023-05-24 ENCOUNTER — Other Ambulatory Visit (HOSPITAL_COMMUNITY): Payer: Self-pay

## 2023-05-24 ENCOUNTER — Encounter: Payer: Self-pay | Admitting: Internal Medicine

## 2023-05-24 NOTE — Telephone Encounter (Signed)
 Oral Oncology Patient Advocate Encounter   Received notification that prior authorization for Lytgobi is required.   PA submitted on 05/24/2023 Key [id number] Status is pending     [name], CPhT Oncology Pharmacy Patient Advocate [facility] Direct Number: [phone] Fax: [phone]

## 2023-05-25 ENCOUNTER — Other Ambulatory Visit: Payer: Self-pay | Admitting: Pharmacist

## 2023-05-25 ENCOUNTER — Other Ambulatory Visit: Payer: Self-pay | Admitting: Nurse Practitioner

## 2023-05-25 ENCOUNTER — Encounter: Payer: Self-pay | Admitting: Internal Medicine

## 2023-05-25 ENCOUNTER — Other Ambulatory Visit (HOSPITAL_COMMUNITY): Payer: Self-pay

## 2023-05-25 ENCOUNTER — Telehealth: Payer: Self-pay | Admitting: Nurse Practitioner

## 2023-05-25 DIAGNOSIS — C221 Intrahepatic bile duct carcinoma: Secondary | ICD-10-CM

## 2023-05-25 MED ORDER — FUTIBATINIB (20 MG DAILY DOSE) 4 MG PO TBPK
20.0000 mg | ORAL_TABLET | Freq: Every day | ORAL | 0 refills | Status: DC
  Filled 2023-05-26: qty 140, 28d supply, fill #0

## 2023-05-25 NOTE — Telephone Encounter (Signed)
 Oral Oncology Patient Advocate Encounter  Prior Authorization for Vicki Phillips has been approved.    PA# [id number] Effective dates: 05/25/2023 through 05/24/2024  Patients co-pay is $100.    [name], CPhT Oncology Pharmacy Patient Advocate [facility] Direct Number: [phone] Fax: [phone]

## 2023-05-25 NOTE — Telephone Encounter (Signed)
 Spoke to patient by phone. Reviewed that I had discussed recent findings with Dr Hulen Luster who recommended futibatinib as next treatment. She had also offered in person appointment or virtual if patient desired. Order for Futibatinib signed and sent to pharmacy. Patient estimated to receive medication Friday. Advised to hold off on starting until she has seen Dr Alena Bills. She does have an eye doctor and has had recent eye exam.

## 2023-05-26 ENCOUNTER — Telehealth: Payer: Self-pay | Admitting: *Deleted

## 2023-05-26 ENCOUNTER — Telehealth: Payer: Self-pay | Admitting: Pharmacy Technician

## 2023-05-26 ENCOUNTER — Other Ambulatory Visit: Payer: Self-pay | Admitting: Pharmacy Technician

## 2023-05-26 ENCOUNTER — Other Ambulatory Visit (HOSPITAL_COMMUNITY): Payer: Self-pay

## 2023-05-26 ENCOUNTER — Encounter: Payer: Self-pay | Admitting: Internal Medicine

## 2023-05-26 ENCOUNTER — Other Ambulatory Visit: Payer: Self-pay

## 2023-05-26 NOTE — Progress Notes (Signed)
 Specialty Pharmacy Initial Fill Coordination Note  Vicki Phillips is a 58 y.o. female contacted today regarding refills of specialty medication(s) Futibatinib (LYTGOBI) .  Patient requested Delivery  on 05/27/23  to verified address [address] [place] [address]   Medication will be filled on 05/26/2023.   Patient is aware of $0 copayment. Bill copay card  Ella Bodo, CPhT Oncology Pharmacy Patient Advocate [facility] Direct Number: [phone] Fax: [phone]

## 2023-05-26 NOTE — Telephone Encounter (Signed)
 Clinical Pharmacist Practitioner Encounter   [facility] (Specialty) to deliver medication by Friday 05/28/23, she knows to hold starting until after her appt with Dr. Alena Bills.   Patient Education I spoke with patient for overview of new oral chemotherapy medication: Lytgobi (futibatinib) for the treatment of progressive intrahepatic cholangiocarcinoma with and FGFR2 rearrangement,  planned duration until disease progression or unacceptable drug toxicity.   Counseled patient on administration, dosing, side effects, monitoring, drug-food interactions, safe handling, storage, and disposal. Patient will take 5 tablets (20 mg total) by mouth daily.   Side effects include but not limited to: hyperphosphatemia, diarrhea or constipation, fatigue, mouth irritation, and eye disorders.    Patient is aware she will need a baseline phos checked and continued monitoring. She reports a recent eye exam that can be used as a baseline, but she will need eye exam, including optical coherence tomography (OCT), every 2 months for the first 6 months, and then every 3 months thereafter.  Reviewed with patient importance of keeping a medication schedule and plan for any missed doses.  After discussion with patient no patient barriers to medication adherence identified.   Ms. Gerbino voiced understanding and appreciation. All questions answered. Medication handout provided.  Provided patient with Oral Chemotherapy Navigation Clinic phone number. Patient knows to call the office with questions or concerns. Oral Chemotherapy Navigation Clinic will continue to follow.  Remi Haggard, PharmD, BCOP, CPP Hematology/Oncology Clinical Pharmacist [facility] Oral Chemotherapy Navigation Clinic [phone]  05/26/2023 10:39 AM

## 2023-05-26 NOTE — Progress Notes (Signed)
 Patient education documented in EPIC note on 05/26/23.

## 2023-05-26 NOTE — Telephone Encounter (Signed)
 We received a 2nd prescription request form for her medication so I called and spoke with the [facility] pharmacy and it isn't even time to refill the medication.Marland Kitchen

## 2023-05-26 NOTE — Telephone Encounter (Signed)
 She has a time for next tues at 5 to 6 pm or she has next wed, 4:30 to 5. I told them that MD on vacation and will come back tom. And ask her about this.

## 2023-05-26 NOTE — Telephone Encounter (Signed)
 Oral Oncology Patient Advocate Encounter   Was successful in obtaining a copay card for Lytgobi.  This copay card will make the patients copay $0.  The billing information is as follows and has been shared with [facility].   RxBin: [id number] PCN: [id number] Member ID: [id number] Group ID: [id number]   [name], CPhT Oncology Pharmacy Patient Advocate [facility] Direct Number: [phone] Fax: [phone]

## 2023-05-27 ENCOUNTER — Encounter: Payer: Self-pay | Admitting: Internal Medicine

## 2023-05-27 ENCOUNTER — Other Ambulatory Visit: Payer: BC Managed Care – PPO

## 2023-05-27 ENCOUNTER — Ambulatory Visit: Payer: BC Managed Care – PPO

## 2023-05-27 DIAGNOSIS — C221 Intrahepatic bile duct carcinoma: Secondary | ICD-10-CM | POA: Diagnosis not present

## 2023-05-28 ENCOUNTER — Encounter: Payer: Self-pay | Admitting: Internal Medicine

## 2023-05-28 ENCOUNTER — Telehealth: Payer: Self-pay

## 2023-05-28 ENCOUNTER — Ambulatory Visit: Payer: BC Managed Care – PPO

## 2023-05-28 ENCOUNTER — Ambulatory Visit

## 2023-05-28 ENCOUNTER — Other Ambulatory Visit: Payer: Self-pay

## 2023-05-28 ENCOUNTER — Inpatient Hospital Stay: Attending: Internal Medicine | Admitting: Internal Medicine

## 2023-05-28 ENCOUNTER — Other Ambulatory Visit (HOSPITAL_COMMUNITY): Payer: Self-pay

## 2023-05-28 ENCOUNTER — Other Ambulatory Visit

## 2023-05-28 ENCOUNTER — Telehealth: Payer: Self-pay | Admitting: Pharmacy Technician

## 2023-05-28 ENCOUNTER — Inpatient Hospital Stay

## 2023-05-28 ENCOUNTER — Telehealth: Payer: Self-pay | Admitting: *Deleted

## 2023-05-28 VITALS — BP 145/80 | HR 101 | Temp 97.4°F | Resp 18 | Wt 230.0 lb

## 2023-05-28 DIAGNOSIS — I10 Essential (primary) hypertension: Secondary | ICD-10-CM | POA: Diagnosis not present

## 2023-05-28 DIAGNOSIS — C787 Secondary malignant neoplasm of liver and intrahepatic bile duct: Secondary | ICD-10-CM | POA: Diagnosis not present

## 2023-05-28 DIAGNOSIS — Z79899 Other long term (current) drug therapy: Secondary | ICD-10-CM | POA: Diagnosis not present

## 2023-05-28 DIAGNOSIS — C221 Intrahepatic bile duct carcinoma: Secondary | ICD-10-CM | POA: Insufficient documentation

## 2023-05-28 DIAGNOSIS — C7801 Secondary malignant neoplasm of right lung: Secondary | ICD-10-CM

## 2023-05-28 DIAGNOSIS — D6481 Anemia due to antineoplastic chemotherapy: Secondary | ICD-10-CM | POA: Insufficient documentation

## 2023-05-28 DIAGNOSIS — Z5111 Encounter for antineoplastic chemotherapy: Secondary | ICD-10-CM | POA: Diagnosis not present

## 2023-05-28 DIAGNOSIS — C7802 Secondary malignant neoplasm of left lung: Secondary | ICD-10-CM

## 2023-05-28 DIAGNOSIS — C7951 Secondary malignant neoplasm of bone: Secondary | ICD-10-CM | POA: Insufficient documentation

## 2023-05-28 DIAGNOSIS — R630 Anorexia: Secondary | ICD-10-CM

## 2023-05-28 MED ORDER — SODIUM CHLORIDE 0.9% FLUSH
10.0000 mL | Freq: Once | INTRAVENOUS | Status: AC
  Administered 2023-05-28: 10 mL via INTRAVENOUS
  Filled 2023-05-28: qty 10

## 2023-05-28 MED ORDER — ONDANSETRON HCL 4 MG/2ML IJ SOLN
4.0000 mg | Freq: Once | INTRAMUSCULAR | Status: AC
  Administered 2023-05-28: 4 mg via INTRAVENOUS
  Filled 2023-05-28: qty 2

## 2023-05-28 MED ORDER — OLANZAPINE 10 MG PO TABS: 10.0000 mg | ORAL_TABLET | Freq: Every day | ORAL | 0 refills | Status: DC

## 2023-05-28 MED ORDER — HEPARIN SOD (PORK) LOCK FLUSH 100 UNIT/ML IV SOLN
500.0000 [IU] | Freq: Once | INTRAVENOUS | Status: AC
  Administered 2023-05-28: 500 [IU] via INTRAVENOUS
  Filled 2023-05-28: qty 5

## 2023-05-28 MED ORDER — SODIUM CHLORIDE 0.9 % IV SOLN
Freq: Once | INTRAVENOUS | Status: AC
Start: 2023-05-28 — End: 2023-05-28
  Filled 2023-05-28: qty 250

## 2023-05-28 NOTE — Telephone Encounter (Signed)
 I filled out the [employer] paper work, I am just waiting on Dr. Alena Bills to sign more of the paper work when she comes back on Monday, 05/31/2023.

## 2023-05-28 NOTE — Telephone Encounter (Signed)
 Md will discuss w/patient at apt today.

## 2023-05-28 NOTE — Telephone Encounter (Addendum)
 Oral Oncology Patient Advocate Encounter  Dr. Alena Bills informed me that per patient's visit at [facility] the provider there as well as herself want patient to start on 16 mg dose first and then increase to 20 mg if pt tolerates medication well.  I informed provider that this medication comes in tabs of 4 mg so pt can take four tablets for now. This will prevent [place] from having to go through insurance again to get dose approved since pt has already received medication (20mg  dose).  Dr. Alena Bills stated that she will inform pt to only take four of the 4 mg tablets (16mg  total) instead of 5 tablets until her next visit.  [name], CPhT Oncology Pharmacy Patient Advocate [facility] Direct Number: [phone] Fax: [phone]

## 2023-05-28 NOTE — Telephone Encounter (Signed)
 Vicki Phillips with [employer] her intake # is [id number]. The number to [employer] [phone] with ext #[phone]. I called her and it went to email and I said the fax number is [phone]

## 2023-05-28 NOTE — Progress Notes (Addendum)
 [facility] CONSULT NOTE  Patient Care Team: Sheron Dixons, MD as PCP - General (Internal Medicine) Drake Gens, RN as Oncology Nurse Navigator Loreatha Rodney, MD as Consulting Physician (Oncology)   CANCER STAGING   Cancer Staging  Cholangiocarcinoma [facility]) Staging form: Intrahepatic Bile Duct, AJCC 8th Edition - Clinical stage from 08/26/2022: Stage IV (cM1) - Signed by Loreatha Rodney, MD on 09/03/2022 Stage prefix: Initial diagnosis   Treatment Cisplatin 25 mg/m/gemcitabine 1000 mg/m/Durvalumab -completed 8 cycles on 03/04/2023 Maintenance Durvalumab/gemcitabine-started 03/26/2023  ASSESSMENT & PLAN:  Vicki Phillips 58 y.o. 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  # Intrahepatic Cholangiocarcinoma, stage IV - s/p liver biopsy on 08/26/2022 showed metastatic moderately differentiated adenocarcinoma.  IHC stain positive for CK7 and CK20.  TTF-1, PAX8, GATA3 and CDX2 are negative.  Differentials include cholangiocarcinoma and pancreatic carcinoma.   -PD-L1 0%.  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  -s/p cisplatin, gemcitabine and Durvalumab every 3 weeks for 8 cycles.  Then on maintenance Durvalumab and gemcitabine until March 2025. -With rising CA 19-9, CT chest abdomen pelvis was done which unfortunately showed progression of the lung nodules.  -Based on FGFR 2 fusion rearrangement, plan is to start her on futibatinib 16 mg once daily.  Labs reviewed and acceptable.  Phosphorus 4.0.  She was also seen by Dr. Carlon Chester at [facility].  Appreciate recommendations.  Side effects were rediscussed such as hyperphosphatemia, hair loss, diarrhea, skin rash, dry eyes, 8% risk of retinal epithelial pigment detachment, dry skin, constipation, diarrhea, decreased appetite, muscle spasms, numbness.  Per FDA package insert, there is a recommendation for OCT testing of the macula.   Per patient, she had regular ophthalmology exam done with [facility].  I am not sure if she had OCT testing done.  Have placed a call.  I have also advised the patient to establish with ophthalmologist team at [facility] to allow for close care coordination.  She will reach out to their office and will inform me when she can have the OCT testing done as a baseline.    -She will need OCT testing and eye exam every 2 months for 6 months and then every 3 months.  There is no clear-cut guidelines on the phosphorus monitoring.  Will monitor every 2 weeks for now.  FOENIX-CCA2 trial Results Between June 08, 2016, and January 21, 2018, a total of 103 patients were enrolled and received futibatinib. A total of 43 of 103 patients (42%; 95% confidence interval, 32 to 52) had a response, and the median duration of response was 9.7 months. Responses were consistent across patient subgroups, including patients with heavily pretreated disease, older adults, and patients who had co-occurring TP53 mutations. At a median follow-up of 17.1 months, the median progression-free survival was 9.0 months and overall survival was 21.7 months. Common treatment-related grade 3 adverse events were hyperphosphatemia (in 30% of the patients), an increased aspartate aminotransferase level (in 7%), stomatitis (in 6%), and fatigue (in 6%). Treatment-related adverse events led to permanent discontinuation of futibatinib in 2% of the patients. No treatment-related deaths occurred. Quality of life was maintained throughout treatment.  # Dehydration -Proceed with IV fluids 1 L.  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 -Improving since off steroid.  Will monitor for now.  # Transaminitis -Mild.  Treatment related.  Monitor.  # Chemotherapy related  anemia -Iron panel consistent with anemia of  chronic disease - s/p 1 U PRBC on 01/05/23.  # Shortness of breath -Stable.  Continue with  albuterol inhaler  # Right knee pain -MRI right knee showing horizontal tear in the posterior horn median meniscus and moderate-sized Baker's cyst with suspected leak or rupture, tricompartmental osteoarthritis.   -Seen by [facility].  Had cortisone shot with mild improvement.    # Hypertension -Continue with amlodipine 5 mg daily.   # Cough -Continue with Tussionex as needed.  # Poor appetite -Patient is open to start appetite stimulant.  Sent prescription for olanzapine 10 mg once daily.  # History of sarcoidosis -Management per pulmonary -Never required treatment.  Orders Placed This Encounter  Procedures   CBC with Differential (Cancer Center Only)    Standing Status:   Future    Expected Date:   06/11/2023    Expiration Date:   05/27/2024   CMP (Cancer Center only)    Standing Status:   Future    Expected Date:   06/11/2023    Expiration Date:   05/27/2024   RTC in 2 weeks for MD visit, labs.  The total time spent in the appointment was 30 minutes encounter with patients including review of chart and various tests results, discussions about plan of care and coordination of care plan   All questions were answered. The patient knows to call the clinic with any problems, questions or concerns. No barriers to learning was detected.  Loreatha Rodney, MD 4/4/20251:17 PM   HISTORY OF PRESENTING ILLNESS:  Vicki Phillips 58 y.o. 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  Patient reports she presented to ED with complaints of chest pain.  Had chest x-ray which showed pulmonary nodules was referred to pulmonary.  Records not available.  Was then seen by Dr. Darnelle Elders on 08/05/2022.  CT chest showed innumerable pulmonary nodules of varying sizes throughout the lung.  Largest spiculated appearing nodule of anterior right upper lobe measuring 2 x 2 cm, at least 1 ill-defined hypodense lesion of the  anterior left lobe of the liver.  Despite stated history of sarcoidosis, there are no specific or characteristic features of sarcoidosis with respect to distribution, morphology, associated fibrotic change, nor associated mediastinal lymphadenopathy. 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  She has history of sarcoidosis but was never on treatment.  Reports that she does not see doctors on a regular basis because she is had bad experiences.  Interval history Patient seen today as follow-up now on maintenance gemcitabine and Durvalumab combination. Patient is feeling much better today.  No more fevers.  Continues to have chronic shortness of breath which is unchanged.  Chronic cough which has worsened.   I have reviewed her chart and materials related to her cancer extensively and collaborated history with the patient. Summary of oncologic history is as follows: Oncology History  Cholangiocarcinoma (HCC)  08/26/2022 Cancer Staging   Staging form: Intrahepatic Bile Duct, AJCC 8th Edition - Clinical stage from 08/26/2022: Stage IV (cM1) - Signed by Loreatha Rodney, MD on 09/03/2022 Stage prefix: Initial diagnosis   09/03/2022 Initial Diagnosis   Cholangiocarcinoma (HCC)   09/18/2022 - 04/30/2023 Chemotherapy   Patient is on Treatment Plan : BILIARY TRACT Cisplatin + Gemcitabine D1,8 + Durvalumab (1500) D1 q21d / Durvalumab (1500) q28d     Metastasis  to lung (HCC)  09/03/2022 Initial Diagnosis   Metastasis to lung (HCC)   09/18/2022 - 04/30/2023 Chemotherapy   Patient is on Treatment Plan : BILIARY TRACT Cisplatin + Gemcitabine D1,8 + Durvalumab (1500) D1 q21d / Durvalumab (1500) q28d       MEDICAL HISTORY:  Past Medical History:  Diagnosis Date   Allergy    Bile duct cancer (HCC)    Sarcoidosis 2011    SURGICAL HISTORY: Past  Surgical History:  Procedure Laterality Date   CESAREAN SECTION     x 2   COLONOSCOPY WITH PROPOFOL N/A 08/10/2019   Procedure: COLONOSCOPY WITH PROPOFOL;  Surgeon: Marnee Sink, MD;  Location: [facility];  Service: Endoscopy;  Laterality: N/A;  priority 4   IR IMAGING GUIDED PORT INSERTION  09/11/2022   LUNG BIOPSY      SOCIAL HISTORY: Social History   Socioeconomic History   Marital status: Single    Spouse name: Not on file   Number of children: 2   Years of education: Not on file   Highest education level: Not on file  Occupational History   Not on file  Tobacco Use   Smoking status: Never   Smokeless tobacco: Never  Vaping Use   Vaping status: Never Used  Substance and Sexual Activity   Alcohol use: Not Currently    Alcohol/week: 4.0 standard drinks of alcohol    Types: 4 Glasses of wine per week   Drug use: No   Sexual activity: Yes    Partners: Male    Birth control/protection: Surgical  Other Topics Concern   Not on file  Social History Narrative   Not on file   Social Drivers of Health   Financial Resource Strain: Low Risk  (09/18/2022)   Overall Financial Resource Strain (CARDIA)    Difficulty of Paying Living Expenses: Not hard at all  Food Insecurity: No Food Insecurity (08/24/2022)   Hunger Vital Sign    Worried About Running Out of Food in the Last Year: Never true    Ran Out of Food in the Last Year: Never true  Transportation Needs: No Transportation Needs (08/24/2022)   PRAPARE - Administrator, Civil Service (Medical): No    Lack of Transportation (Non-Medical): No  Physical Activity: Not on file  Stress: Not on file  Social Connections: Not on file  Intimate Partner Violence: Not At Risk (08/24/2022)   Humiliation, Afraid, Rape, and Kick questionnaire    Fear of Current or Ex-Partner: No    Emotionally Abused: No    Physically Abused: No    Sexually Abused: No    FAMILY HISTORY: Family History  Problem Relation Age of  Onset   Stroke Mother    Heart disease Mother    Heart attack Father 68   Heart disease Father    Kidney disease Sister     ALLERGIES:  is allergic to other.  MEDICATIONS:  Current Outpatient Medications  Medication Sig Dispense Refill   OLANZapine (ZYPREXA) 10 MG tablet Take 1 tablet (10 mg total) by mouth at bedtime. 30 tablet 0   albuterol (VENTOLIN HFA) 108 (90 Base) MCG/ACT inhaler Inhale 2 puffs into the lungs every 6 (six) hours as needed for wheezing or shortness of breath. 8 g 0   amLODipine (NORVASC) 5 MG tablet Take 1 tablet (5 mg total) by mouth daily. 90 tablet 1   chlorpheniramine-HYDROcodone (TUSSIONEX) 10-8 MG/5ML Take 5 mLs by mouth at bedtime as needed for cough. 140  mL 0   futibatinib, 20 mg daily dose, (LYTGOBI, 20 MG DAILY DOSE,) 4 MG tablet Take 5 tablets (20 mg total) by mouth daily. 140 tablet 0   loratadine (CLARITIN) 10 MG tablet Take 10 mg by mouth daily. (Patient not taking: Reported on 04/09/2023)     pseudoephedrine-acetaminophen (TYLENOL SINUS) 30-500 MG TABS tablet Take 1 tablet by mouth in the morning and at bedtime.     No current facility-administered medications for this visit.    REVIEW OF SYSTEMS:   Pertinent information mentioned in HPI All other systems were reviewed with the patient and are negative.  PHYSICAL EXAMINATION: ECOG PERFORMANCE STATUS: 1 - Symptomatic but completely ambulatory  Vitals:   05/28/23 0959  BP: (!) 145/80  Pulse: (!) 101  Resp: 18  Temp: (!) 97.4 F (36.3 C)  SpO2: 99%      Filed Weights   05/28/23 0959  Weight: 230 lb (104.3 kg)        GENERAL:alert, no distress and comfortable SKIN: skin color, texture, turgor are normal, no rashes or significant lesions EYES: normal, conjunctiva are pink and non-injected, sclera clear OROPHARYNX:no exudate, no erythema and lips, buccal mucosa, and tongue normal  NECK: supple, thyroid normal size, non-tender, without nodularity LYMPH:  no palpable  lymphadenopathy in the cervical, axillary or inguinal LUNGS: clear to auscultation and percussion with normal breathing effort HEART: regular rate & rhythm and no murmurs and no lower extremity edema ABDOMEN:abdomen soft, non-tender and normal bowel sounds Musculoskeletal:no cyanosis of digits and no clubbing  PSYCH: alert & oriented x 3 with fluent speech NEURO: no focal motor/sensory deficits  LABORATORY DATA:  I have reviewed the data as listed Lab Results  Component Value Date   WBC 3.5 (L) 05/21/2023   HGB 12.5 05/21/2023   HCT 37.8 05/21/2023   MCV 114.5 (H) 05/21/2023   PLT 240 05/21/2023   Recent Labs    04/27/23 1114 04/30/23 1342 05/21/23 0838  NA 136 137 136  K 4.0 3.8 3.9  CL 101 101 103  CO2 24 25 24   GLUCOSE 98 95 107*  BUN 11 10 10   CREATININE 0.58 0.79 0.75  CALCIUM 8.8* 9.0 8.8*  GFRNONAA >60 >60 >60  PROT 7.1 7.4 7.4  ALBUMIN 3.7 3.9 3.9  AST 155* 85* 48*  ALT 106* 78* 27  ALKPHOS 116 143* 132*  BILITOT 0.9 0.8 0.7    RADIOGRAPHIC STUDIES: I have personally reviewed the radiological images as listed and agreed with the findings in the report. CT CHEST ABDOMEN PELVIS W CONTRAST Result Date: 05/19/2023 CLINICAL DATA:  Cholangiocarcinoma staging EXAM: CT CHEST, ABDOMEN, AND PELVIS WITH CONTRAST TECHNIQUE: Multidetector CT imaging of the chest, abdomen and pelvis was performed following the standard protocol during bolus administration of intravenous contrast. RADIATION DOSE REDUCTION: This exam was performed according to the departmental dose-optimization program which includes automated exposure control, adjustment of the mA and/or kV according to patient size and/or use of iterative reconstruction technique. CONTRAST:  OMNIPAQUE IOHEXOL 300 MG/ML  SOLN COMPARISON:  CT chest February 26, 2023 CT chest November 30, 2022 FINDINGS: CT CHEST FINDINGS Cardiovascular: No significant vascular findings. Normal heart size. No pericardial effusion.  Mediastinum/Nodes: No enlarged mediastinal, hilar, or axillary lymph nodes. Thyroid gland, trachea, and esophagus demonstrate no significant findings. Lungs/Pleura: Comparison with prior examination accounting for differences in techniques there is no significant change in the numerous bilateral pulmonary nodules. The largest of the nodules in the anterior segment of the right upper lobe  measures 1.9 x 2.1 cm unchanged since prior examination. The largest of the nodule in the left upper lobe image 51 measures 1.5 x 1.3 cm unchanged since prior examination. The largest of the nodules in the inferior margin of the right upper lobe posterior segment image 63 measures 1.7 x 1.4 cm and appears larger than on prior examination when it measured 1.3 x 1.2 cm. Nodule in the left upper lobe image 65 measures 2.1 x 1.5 cm, appears larger than prior examination when it measured 1.5 by 1.3 cm Nodule in the right middle lobe measures 1.7 by 1.6 cm and appears larger than prior examination when it measured 1.1 x 1.5 cm Nodule in the anterior margin of the right lower lobe measures 1.8 x 1.9 cm compared with prior examination 1.5 x 1.5 cm. Multiple other nodules are also increased in size since prior examination and in number. Bilobed nodule of the l right lower lobe measured at 21 mm appears unchanged since prior. Findings correlate with progression of metastatic lung disease. Musculoskeletal: Persistent metastatic lesion within the T3 vertebral body. CT ABDOMEN PELVIS FINDINGS Hepatobiliary: Comparison with prior examination accounting for differences in techniques there is no significant change in the lesion within the medial segment of the left lobe of the liver. Measuring 6.8 by 5.9 cm. With calcifications. The previously described small subcapsular or pericapsular nodule adjacent to the left lobe of the liver is not identified on these images. The previously described 3.6 mm hypodense nodule of the left lobe of the liver  appears unchanged since prior examination. The previously described 11 mm hypodense nodule in the right lobe of the liver segment 5 is unchanged since prior examination. No other focal hepatic lesions or biliary dilatation. Gallbladder unremarkable. Pancreas: Unremarkable. No pancreatic ductal dilatation or surrounding inflammatory changes. Spleen: Normal in size without focal abnormality. Adrenals/Urinary Tract: Adrenal glands are unremarkable. Kidneys are normal, without renal calculi, focal lesion, or hydronephrosis. Bladder is unremarkable. Stomach/Bowel: Stomach is within normal limits. Appendix appears normal. No evidence of bowel wall thickening, distention, or inflammatory changes. No change colonic diverticulosis compared with prior examination. Vascular/Lymphatic: No significant vascular findings are present. No enlarged abdominal or pelvic lymph nodes. Reproductive: Uterus and bilateral adnexa are unremarkable. Other: Stable small anterior abdominal wall umbilical hernia containing fat only. Musculoskeletal: IMPRESSION: *Progression of metastatic lung disease. *Stable metastatic lesion within the T3 vertebral body. *Stable hepatic lesions. *No other significant changes. Electronically Signed   By: Fredrich Jefferson M.D.   On: 05/19/2023 11:01

## 2023-05-28 NOTE — Progress Notes (Signed)
 Patient went to the [facility] doctor yesterday and they said that she should start with just taking 3 of the pills instead of the 4 it tells her to. Blood work tested for phosphorous once a month. Next Gen trial. Zinc and vitamin E.  She is feeling dehydrated and thinks that fluids would help her. She is starting to lose weight, due to not having an appetite, so she would like to discuss a medication to help with appetite.

## 2023-05-28 NOTE — Patient Instructions (Signed)
 [facility] - A DEPT OF [facility]  Discharge Instructions: Thank you for choosing [facility] to provide your oncology and hematology care.  If you have a lab appointment with the Cancer Center, please go directly to the Cancer Center and check in at the registration area.  Wear comfortable clothing and clothing appropriate for easy access to any Portacath or PICC line.   We strive to give you quality time with your provider. You may need to reschedule your appointment if you arrive late (15 or more minutes).  Arriving late affects you and other patients whose appointments are after yours.  Also, if you miss three or more appointments without notifying the office, you may be dismissed from the clinic at the provider's discretion.      For prescription refill requests, have your pharmacy contact our office and allow 72 hours for refills to be completed.    Today you received Zofran  To help prevent nausea and vomiting after your treatment, we encourage you to take your nausea medication as directed.  BELOW ARE SYMPTOMS THAT SHOULD BE REPORTED IMMEDIATELY: *FEVER GREATER THAN 100.4 F (38 C) OR HIGHER *CHILLS OR SWEATING *NAUSEA AND VOMITING THAT IS NOT CONTROLLED WITH YOUR NAUSEA MEDICATION *UNUSUAL SHORTNESS OF BREATH *UNUSUAL BRUISING OR BLEEDING *URINARY PROBLEMS (pain or burning when urinating, or frequent urination) *BOWEL PROBLEMS (unusual diarrhea, constipation, pain near the anus) TENDERNESS IN MOUTH AND THROAT WITH OR WITHOUT PRESENCE OF ULCERS (sore throat, sores in mouth, or a toothache) UNUSUAL RASH, SWELLING OR PAIN  UNUSUAL VAGINAL DISCHARGE OR ITCHING   Items with * indicate a potential emergency and should be followed up as soon as possible or go to the Emergency Department if any problems should occur.  Please show the CHEMOTHERAPY ALERT CARD or IMMUNOTHERAPY ALERT CARD at check-in to the Emergency Department and triage  nurse.  Should you have questions after your visit or need to cancel or reschedule your appointment, please contact [facility] - A DEPT OF [facility]  [phone] and follow the prompts.  Office hours are 8:00 a.m. to 4:30 p.m. Monday - Friday. Please note that voicemails left after 4:00 p.m. may not be returned until the following business day.  We are closed weekends and major holidays. You have access to a nurse at all times for urgent questions. Please call the main number to the clinic [phone] and follow the prompts.  For any non-urgent questions, you may also contact your provider using MyChart. We now offer e-Visits for anyone 28 and older to request care online for non-urgent symptoms. For details visit [email or web address].   Also download the MyChart app! Go to the app store, search "MyChart", open the app, select [facility], and log in with your MyChart username and password.

## 2023-05-31 ENCOUNTER — Other Ambulatory Visit: Payer: Self-pay

## 2023-06-02 ENCOUNTER — Telehealth: Payer: Self-pay

## 2023-06-02 NOTE — Telephone Encounter (Signed)
 Dr. Alan Ripper last office visit note has been faxed over to the [facility].

## 2023-06-02 NOTE — Telephone Encounter (Signed)
 Called and spoke with patient in regards to being able to call and cancel her [facility] appointment on Monday. Dr. Alena Bills said she talked with the specialist in [facility] and they are in close association with the Doctor at [facility]. Patient understands and agrees with this plan.

## 2023-06-04 ENCOUNTER — Telehealth: Payer: Self-pay

## 2023-06-04 ENCOUNTER — Other Ambulatory Visit: Payer: Self-pay

## 2023-06-04 DIAGNOSIS — R110 Nausea: Secondary | ICD-10-CM

## 2023-06-04 MED ORDER — PROCHLORPERAZINE MALEATE 10 MG PO TABS: 10.0000 mg | ORAL_TABLET | Freq: Four times a day (QID) | ORAL | 2 refills | Status: DC | PRN

## 2023-06-04 NOTE — Telephone Encounter (Signed)
 Nausea prescription update

## 2023-06-07 ENCOUNTER — Telehealth: Payer: Self-pay | Admitting: *Deleted

## 2023-06-07 NOTE — Telephone Encounter (Signed)
 Per pt's request. Changed leave of absence begin date to 06/14/23. orginal date to start was 06/18/23. Pt was unable to work on 06/14/23 due to ongoing weakness. Faxed [employer] LOA addendum and last office note to [employer]. Fax confirmation [email or web address].

## 2023-06-08 ENCOUNTER — Encounter: Payer: Self-pay | Admitting: Internal Medicine

## 2023-06-11 ENCOUNTER — Telehealth: Payer: Self-pay

## 2023-06-11 NOTE — Telephone Encounter (Signed)
 Spoke with Vicki Phillips in regards to all the paper work that we have been working on for her insurance/disability, and let her know that Dr. Aris Bel finished updating her last Dr. Visit progress note, so I went ahead and faxed the note to the correct destination. I asked [name] if she was doing ok with the new chemo treatment pill that she takes at home and she told me that she was doing good, and she had no new questions or concerns for the doctor at this time.

## 2023-06-14 ENCOUNTER — Inpatient Hospital Stay (HOSPITAL_BASED_OUTPATIENT_CLINIC_OR_DEPARTMENT_OTHER): Admitting: Internal Medicine

## 2023-06-14 ENCOUNTER — Encounter: Payer: Self-pay | Admitting: Internal Medicine

## 2023-06-14 ENCOUNTER — Inpatient Hospital Stay

## 2023-06-14 ENCOUNTER — Telehealth: Payer: Self-pay | Admitting: *Deleted

## 2023-06-14 VITALS — BP 120/88 | HR 90 | Temp 98.0°F | Resp 16 | Wt 232.0 lb

## 2023-06-14 DIAGNOSIS — C221 Intrahepatic bile duct carcinoma: Secondary | ICD-10-CM | POA: Diagnosis not present

## 2023-06-14 DIAGNOSIS — Z5111 Encounter for antineoplastic chemotherapy: Secondary | ICD-10-CM | POA: Diagnosis not present

## 2023-06-14 DIAGNOSIS — D6481 Anemia due to antineoplastic chemotherapy: Secondary | ICD-10-CM | POA: Diagnosis not present

## 2023-06-14 DIAGNOSIS — C7951 Secondary malignant neoplasm of bone: Secondary | ICD-10-CM | POA: Diagnosis not present

## 2023-06-14 DIAGNOSIS — Z79899 Other long term (current) drug therapy: Secondary | ICD-10-CM

## 2023-06-14 DIAGNOSIS — C787 Secondary malignant neoplasm of liver and intrahepatic bile duct: Secondary | ICD-10-CM | POA: Diagnosis not present

## 2023-06-14 DIAGNOSIS — I10 Essential (primary) hypertension: Secondary | ICD-10-CM | POA: Diagnosis not present

## 2023-06-14 LAB — CMP (CANCER CENTER ONLY)
ALT: 36 U/L (ref 0–44)
AST: 41 U/L (ref 15–41)
Albumin: 3.8 g/dL (ref 3.5–5.0)
Alkaline Phosphatase: 119 U/L (ref 38–126)
Anion gap: 12 (ref 5–15)
BUN: 10 mg/dL (ref 6–20)
CO2: 22 mmol/L (ref 22–32)
Calcium: 9.8 mg/dL (ref 8.9–10.3)
Chloride: 102 mmol/L (ref 98–111)
Creatinine: 0.7 mg/dL (ref 0.44–1.00)
GFR, Estimated: >60 mL/min (ref >60)
Glucose, Bld: 111 mg/dL — ABNORMAL HIGH (ref 70–99)
Potassium: 4 mmol/L (ref 3.5–5.1)
Sodium: 136 mmol/L (ref 135–145)
Total Bilirubin: 0.6 mg/dL (ref 0.0–1.2)
Total Protein: 7.5 g/dL (ref 6.5–8.1)

## 2023-06-14 LAB — CBC WITH DIFFERENTIAL (CANCER CENTER ONLY)
Abs Immature Granulocytes: 0.02 10*3/uL (ref 0.00–0.07)
Basophils Absolute: 0 10*3/uL (ref 0.0–0.1)
Basophils Relative: 1 %
Eosinophils Absolute: 0.2 10*3/uL (ref 0.0–0.5)
Eosinophils Relative: 4 %
HCT: 40.6 % (ref 36.0–46.0)
Hemoglobin: 13.7 g/dL (ref 12.0–15.0)
Immature Granulocytes: 1 %
Lymphocytes Relative: 16 %
Lymphs Abs: 0.7 10*3/uL (ref 0.7–4.0)
MCH: 36.7 pg — ABNORMAL HIGH (ref 26.0–34.0)
MCHC: 33.7 g/dL (ref 30.0–36.0)
MCV: 108.8 fL — ABNORMAL HIGH (ref 80.0–100.0)
Monocytes Absolute: 0.4 10*3/uL (ref 0.1–1.0)
Monocytes Relative: 11 %
Neutro Abs: 2.8 10*3/uL (ref 1.7–7.7)
Neutrophils Relative %: 67 %
Platelet Count: 199 10*3/uL (ref 150–400)
RBC: 3.73 MIL/uL — ABNORMAL LOW (ref 3.87–5.11)
RDW: 12.2 % (ref 11.5–15.5)
WBC Count: 4.1 10*3/uL (ref 4.0–10.5)
nRBC: 0 % (ref 0.0–0.2)

## 2023-06-14 LAB — PHOSPHORUS: Phosphorus: 7.5 mg/dL — ABNORMAL HIGH (ref 2.5–4.6)

## 2023-06-14 MED ORDER — CALCIUM ACETATE (PHOS BINDER) 667 MG PO TABS: 1334.0000 mg | ORAL_TABLET | Freq: Three times a day (TID) | ORAL | 2 refills | Status: DC

## 2023-06-14 NOTE — Progress Notes (Signed)
 [facility] CONSULT NOTE  Patient Care Team: Drake Gens, RN as Oncology Nurse Navigator Loreatha Rodney, MD as Consulting Physician (Oncology)   CANCER STAGING   Cancer Staging  Cholangiocarcinoma [facility]) Staging form: Intrahepatic Bile Duct, AJCC 8th Edition - Clinical stage from 08/26/2022: Stage IV (cM1) - Signed by Loreatha Rodney, MD on 09/03/2022 Stage prefix: Initial diagnosis   Treatment Cisplatin  25 mg/m/gemcitabine  1000 mg/m/Durvalumab  -completed 8 cycles on 03/04/2023 Maintenance Durvalumab /gemcitabine -started 03/26/2023  ASSESSMENT & PLAN:  Vicki Phillips 58 y.o. 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  # Intrahepatic Cholangiocarcinoma, stage IV - s/p liver biopsy on 08/26/2022 showed metastatic moderately differentiated adenocarcinoma.  IHC stain positive for CK7 and CK20.  TTF-1, PAX8, GATA3 and CDX2 are negative.  Differentials include cholangiocarcinoma and pancreatic carcinoma.   -PD-L1 0%.  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  -s/p cisplatin , gemcitabine  and Durvalumab  every 3 weeks for 8 cycles.  Then on maintenance Durvalumab  and gemcitabine  until March 2025. -With rising CA 19-9, CT chest abdomen pelvis was done which unfortunately showed progression of the lung nodules.  - Based on FGFR 2 gene rearrangement, plan was to start futibatinib  16 mg.  Patient decided to start at full dose 20 mg on 06/04/2023.  Phosphorus is elevated to 7.5.  Dosing guidelines reviewed.  Will decrease dose to 16 mg once daily.  Weekly phosphorus level check.  Started on PhosLo  2 tabs with every meals.  If phosphorus level goes below 7 in the next 2 weeks, we will continue with futibatinib  16 mg once daily.  -She will need OCT testing and eye exam every 2 months for 6 months and then every 3 months.    # Hyperphosphatemia -Secondary to futibatinib .  Management as  above  # Nausea -We have discussed about use of antiemetics.  Patient has all the medication she needs and will use as needed.  # Diarrhea -Chronic.  Imodium as needed.  Would like to hold off on Lomotil.  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 -Improving since off steroid.  Will monitor for now.  # Chemotherapy related  anemia -Iron panel consistent with anemia of chronic disease - s/p 1 U PRBC on 01/05/23.  # Shortness of breath -Stable.  Continue with albuterol  inhaler  # Right knee pain -MRI right knee showing horizontal tear in the posterior horn median meniscus and moderate-sized Baker's cyst with suspected leak or rupture, tricompartmental osteoarthritis.   -Seen by [facility] [place].  Had cortisone shot with mild improvement.    # Hypertension -Continue with amlodipine  5 mg daily.   # Cough -Continue with Tussionex as needed.  Improved with futibatinib .  # Poor appetite -Patient is open to start appetite stimulant.  Sent prescription for olanzapine  10 mg once daily.  # History of sarcoidosis -Management per pulmonary -Never required treatment.  Orders Placed This Encounter  Procedures   CBC with Differential (Cancer Center Only)    Standing Status:   Future    Expected Date:   07/12/2023    Expiration Date:   06/13/2024   CMP (Cancer Center only)    Standing Status:   Future    Expected Date:   07/12/2023    Expiration Date:   06/13/2024   Cancer antigen 19-9    Standing Status:   Future    Expected Date:   07/12/2023    Expiration Date:   06/13/2024   Phosphorus  Standing Status:   Future    Expected Date:   07/12/2023    Expiration Date:   06/13/2024   CBC with Differential (Cancer Center Only)    Standing Status:   Future    Expected Date:   06/24/2023    Expiration Date:   06/13/2024   CMP (Cancer Center only)    Standing Status:   Future    Expected Date:   06/24/2023    Expiration Date:   06/13/2024   Phosphorus     Standing Status:   Future    Expected Date:   06/24/2023    Expiration Date:   06/13/2024   Weekly labs Follow-up with MD in 4 weeks.  The total time spent in the appointment was 30 minutes encounter with patients including review of chart and various tests results, discussions about plan of care and coordination of care plan   All questions were answered. The patient knows to call the clinic with any problems, questions or concerns. No barriers to learning was detected.  Loreatha Rodney, MD 4/21/202512:37 PM   HISTORY OF PRESENTING ILLNESS:  Vicki Phillips 58 y.o. 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  Patient reports she presented to ED with complaints of chest pain.  Had chest x-ray which showed pulmonary nodules was referred to pulmonary.  Records not available.  Was then seen by Dr. Darnelle Elders on 08/05/2022.  CT chest showed innumerable pulmonary nodules of varying sizes throughout the lung.  Largest spiculated appearing nodule of anterior right upper lobe measuring 2 x 2 cm, at least 1 ill-defined hypodense lesion of the anterior left lobe of the liver.  Despite stated history of sarcoidosis, there are no specific or characteristic features of sarcoidosis with respect to distribution, morphology, associated fibrotic change, nor associated mediastinal lymphadenopathy. 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  She has history of sarcoidosis but was never on treatment.  Reports that she does not see doctors on a regular basis because she is had bad experiences.  Interval history Patient was seen today as follow-up for toxicity check on futibatinib . She has been taking it for close to 2 weeks now.  She does report nausea which is unchanged since  starting the new medication.  Reports diarrhea.  Had about 4-5 episodes.  Has been using Imodium.  Wants to hold off on Lomotil.  Cough is improved since starting futibatinib .   I have reviewed her chart and materials related to her cancer extensively and collaborated history with the patient. Summary of oncologic history is as follows: Oncology History  Cholangiocarcinoma (HCC)  08/26/2022 Cancer Staging   Staging form: Intrahepatic Bile Duct, AJCC 8th Edition - Clinical stage from 08/26/2022: Stage IV (cM1) - Signed by Loreatha Rodney, MD on 09/03/2022 Stage prefix: Initial diagnosis   09/03/2022 Initial Diagnosis   Cholangiocarcinoma (HCC)   09/18/2022 - 04/30/2023 Chemotherapy   Patient is on Treatment Plan : BILIARY TRACT Cisplatin  + Gemcitabine  D1,8 + Durvalumab  (1500) D1 q21d / Durvalumab  (1500) q28d     Metastasis to lung (HCC)  09/03/2022 Initial Diagnosis   Metastasis to lung (HCC)   09/18/2022 - 04/30/2023 Chemotherapy   Patient is on Treatment Plan : BILIARY TRACT Cisplatin  + Gemcitabine  D1,8 + Durvalumab  (1500) D1 q21d / Durvalumab  (1500) q28d  MEDICAL HISTORY:  Past Medical History:  Diagnosis Date   Allergy    Bile duct cancer (HCC)    Sarcoidosis 2011    SURGICAL HISTORY: Past Surgical History:  Procedure Laterality Date   CESAREAN SECTION     x 2   COLONOSCOPY WITH PROPOFOL  N/A 08/10/2019   Procedure: COLONOSCOPY WITH PROPOFOL ;  Surgeon: Marnee Sink, MD;  Location: [facility];  Service: Endoscopy;  Laterality: N/A;  priority 4   IR IMAGING GUIDED PORT INSERTION  09/11/2022   LUNG BIOPSY      SOCIAL HISTORY: Social History   Socioeconomic History   Marital status: Single    Spouse name: Not on file   Number of children: 2   Years of education: Not on file   Highest education level: Not on file  Occupational History   Not on file  Tobacco Use   Smoking status: Never   Smokeless tobacco: Never  Vaping Use   Vaping status: Never Used   Substance and Sexual Activity   Alcohol use: Not Currently    Alcohol/week: 4.0 standard drinks of alcohol    Types: 4 Glasses of wine per week   Drug use: No   Sexual activity: Yes    Partners: Male    Birth control/protection: Surgical  Other Topics Concern   Not on file  Social History Narrative   Not on file   Social Drivers of Health   Financial Resource Strain: Low Risk  (09/18/2022)   Overall Financial Resource Strain (CARDIA)    Difficulty of Paying Living Expenses: Not hard at all  Food Insecurity: No Food Insecurity (08/24/2022)   Hunger Vital Sign    Worried About Running Out of Food in the Last Year: Never true    Ran Out of Food in the Last Year: Never true  Transportation Needs: No Transportation Needs (08/24/2022)   PRAPARE - Administrator, Civil Service (Medical): No    Lack of Transportation (Non-Medical): No  Physical Activity: Not on file  Stress: Not on file  Social Connections: Not on file  Intimate Partner Violence: Not At Risk (08/24/2022)   Humiliation, Afraid, Rape, and Kick questionnaire    Fear of Current or Ex-Partner: No    Emotionally Abused: No    Physically Abused: No    Sexually Abused: No    FAMILY HISTORY: Family History  Problem Relation Age of Onset   Stroke Mother    Heart disease Mother    Heart attack Father 23   Heart disease Father    Kidney disease Sister     ALLERGIES:  is allergic to other.  MEDICATIONS:  Current Outpatient Medications  Medication Sig Dispense Refill   albuterol  (VENTOLIN  HFA) 108 (90 Base) MCG/ACT inhaler Inhale 2 puffs into the lungs every 6 (six) hours as needed for wheezing or shortness of breath. 8 g 0   amLODipine  (NORVASC ) 5 MG tablet Take 1 tablet (5 mg total) by mouth daily. 90 tablet 1   chlorpheniramine-HYDROcodone (TUSSIONEX) 10-8 MG/5ML Take 5 mLs by mouth at bedtime as needed for cough. 140 mL 0   futibatinib , 20 mg daily dose, (LYTGOBI , 20 MG DAILY DOSE,) 4 MG tablet Take 5  tablets (20 mg total) by mouth daily. 140 tablet 0   prochlorperazine  (COMPAZINE ) 10 MG tablet Take 1 tablet (10 mg total) by mouth every 6 (six) hours as needed for nausea or vomiting. 30 tablet 2   pseudoephedrine-acetaminophen  (TYLENOL  SINUS) 30-500 MG TABS tablet Take 1  tablet by mouth in the morning and at bedtime.     loratadine (CLARITIN) 10 MG tablet Take 10 mg by mouth daily. (Patient not taking: Reported on 06/14/2023)     OLANZapine  (ZYPREXA ) 10 MG tablet Take 1 tablet (10 mg total) by mouth at bedtime. (Patient not taking: Reported on 06/14/2023) 30 tablet 0   No current facility-administered medications for this visit.    REVIEW OF SYSTEMS:   Pertinent information mentioned in HPI All other systems were reviewed with the patient and are negative.  PHYSICAL EXAMINATION: ECOG PERFORMANCE STATUS: 1 - Symptomatic but completely ambulatory  Vitals:   06/14/23 1045  BP: 120/88  Pulse: 90  Resp: 16  Temp: 98 F (36.7 C)  SpO2: 96%      Filed Weights   06/14/23 1045  Weight: 232 lb (105.2 kg)        GENERAL:alert, no distress and comfortable SKIN: skin color, texture, turgor are normal, no rashes or significant lesions EYES: normal, conjunctiva are pink and non-injected, sclera clear OROPHARYNX:no exudate, no erythema and lips, buccal mucosa, and tongue normal  NECK: supple, thyroid  normal size, non-tender, without nodularity LYMPH:  no palpable lymphadenopathy in the cervical, axillary or inguinal LUNGS: clear to auscultation and percussion with normal breathing effort HEART: regular rate & rhythm and no murmurs and no lower extremity edema ABDOMEN:abdomen soft, non-tender and normal bowel sounds Musculoskeletal:no cyanosis of digits and no clubbing  PSYCH: alert & oriented x 3 with fluent speech NEURO: no focal motor/sensory deficits  LABORATORY DATA:  I have reviewed the data as listed Lab Results  Component Value Date   WBC 4.1 06/14/2023   HGB 13.7  06/14/2023   HCT 40.6 06/14/2023   MCV 108.8 (H) 06/14/2023   PLT 199 06/14/2023   Recent Labs    04/30/23 1342 05/21/23 0838 06/14/23 1001  NA 137 136 136  K 3.8 3.9 4.0  CL 101 103 102  CO2 25 24 22   GLUCOSE 95 107* 111*  BUN 10 10 10   CREATININE 0.79 0.75 0.70  CALCIUM  9.0 8.8* 9.8  GFRNONAA >60 >60 >60  PROT 7.4 7.4 7.5  ALBUMIN 3.9 3.9 3.8  AST 85* 48* 41  ALT 78* 27 36  ALKPHOS 143* 132* 119  BILITOT 0.8 0.7 0.6    RADIOGRAPHIC STUDIES: I have personally reviewed the radiological images as listed and agreed with the findings in the report. No results found.

## 2023-06-14 NOTE — Telephone Encounter (Signed)
 Apts made and pt informed to dose reduce the Lytogobi to 4 tablets instead of 5. She was instructed to pick up the Phoslo  and take 2 tablets by mouth (three times a day with meals). She gave verbal understanding of the plan of care

## 2023-06-14 NOTE — Telephone Encounter (Signed)
-----   Message from Loreatha Rodney sent at 06/14/2023 12:03 PM EDT ----- Phosphorus level is elevated to 7.5. Will need to start him on phosphate binders.  I am sending a prescription for PhosLo  2 tabs with every meals.  Schedule for weekly phosphorus level check.  4/28 - lab check Week of May 5 - when she comes back from [place] May 15 - phosphorus  Patient should dose reduce to 4 tablets. (Currently on 5 tabs).  Please inform the patient. ----- Message ----- From: Dannis Dy, Lab In [place] Sent: 06/14/2023  10:17 AM EDT To: Loreatha Rodney, MD

## 2023-06-14 NOTE — Progress Notes (Signed)
 Decreasing appetite due to continuous nausea that is not relieved by meds.  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  Continued diarrhea with up to 4 loose stools a day.

## 2023-06-15 LAB — CANCER ANTIGEN 19-9: CA 19-9: 457 U/mL — ABNORMAL HIGH (ref 0–35)

## 2023-06-15 NOTE — Addendum Note (Signed)
 Addended by: Caelyn Route on: 06/15/2023 02:18 PM   Modules accepted: Orders

## 2023-06-17 ENCOUNTER — Other Ambulatory Visit: Payer: Self-pay

## 2023-06-17 ENCOUNTER — Other Ambulatory Visit (HOSPITAL_COMMUNITY): Payer: Self-pay

## 2023-06-17 ENCOUNTER — Other Ambulatory Visit: Payer: Self-pay | Admitting: Internal Medicine

## 2023-06-17 MED ORDER — FUTIBATINIB (20 MG DAILY DOSE) 4 MG PO TBPK
16.0000 mg | ORAL_TABLET | Freq: Every day | ORAL | 0 refills | Status: DC
  Filled 2023-06-17: qty 105, 26d supply, fill #0

## 2023-06-17 NOTE — Progress Notes (Signed)
 Specialty Pharmacy Ongoing Clinical Assessment Note  Vicki Phillips is a 58 y.o. female who is being followed by the specialty pharmacy service for RxSp Oncology   Patient's specialty medication(s) reviewed today: Futibatinib  (LYTGOBI )   Missed doses in the last 4 weeks: 0   Patient/Caregiver did not have any additional questions or concerns.   Therapeutic benefit summary: Unable to assess   Adverse events/side effects summary: Experienced adverse events/side effects (Increased phosphate, taking calcium  acetate to address.)   Patient's therapy is appropriate to: Continue    Goals Addressed             This Visit's Progress    Slow Disease Progression       Patient is unable to be assessed as therapy was recently initiated. Patient will maintain adherence         Follow up:  3 months  Carlson Belland M Nakeia Calvi Specialty Pharmacist

## 2023-06-17 NOTE — Progress Notes (Signed)
 Specialty Pharmacy Refill Coordination Note  Vicki Phillips is a 58 y.o. female contacted today regarding refills of specialty medication(s) Futibatinib  (LYTGOBI )   Patient requested Delivery   Delivery date: 06/24/23   Verified address: [address] [place] [address]   Medication will be filled on 06/23/23.

## 2023-06-18 ENCOUNTER — Other Ambulatory Visit: Payer: Self-pay

## 2023-06-18 ENCOUNTER — Ambulatory Visit: Admitting: Internal Medicine

## 2023-06-18 ENCOUNTER — Ambulatory Visit

## 2023-06-18 ENCOUNTER — Other Ambulatory Visit (HOSPITAL_COMMUNITY): Payer: Self-pay

## 2023-06-18 ENCOUNTER — Other Ambulatory Visit

## 2023-06-21 ENCOUNTER — Inpatient Hospital Stay

## 2023-06-21 ENCOUNTER — Telehealth: Payer: Self-pay | Admitting: *Deleted

## 2023-06-21 DIAGNOSIS — D6481 Anemia due to antineoplastic chemotherapy: Secondary | ICD-10-CM | POA: Diagnosis not present

## 2023-06-21 DIAGNOSIS — Z79899 Other long term (current) drug therapy: Secondary | ICD-10-CM | POA: Diagnosis not present

## 2023-06-21 DIAGNOSIS — C787 Secondary malignant neoplasm of liver and intrahepatic bile duct: Secondary | ICD-10-CM | POA: Diagnosis not present

## 2023-06-21 DIAGNOSIS — C221 Intrahepatic bile duct carcinoma: Secondary | ICD-10-CM

## 2023-06-21 DIAGNOSIS — C7951 Secondary malignant neoplasm of bone: Secondary | ICD-10-CM | POA: Diagnosis not present

## 2023-06-21 DIAGNOSIS — I10 Essential (primary) hypertension: Secondary | ICD-10-CM | POA: Diagnosis not present

## 2023-06-21 DIAGNOSIS — Z5111 Encounter for antineoplastic chemotherapy: Secondary | ICD-10-CM | POA: Diagnosis not present

## 2023-06-21 LAB — PHOSPHORUS: Phosphorus: 7.3 mg/dL — ABNORMAL HIGH (ref 2.5–4.6)

## 2023-06-21 NOTE — Telephone Encounter (Signed)
 Spoke with patient. She is taking Phoslo  as directed with meals and the futibatinib  4 tablets/daily as directed. Dr. Aris Bel notified. Pt instructed to continue with same dose and to keep apt for repeat lab on Thursday 06/24/23

## 2023-06-21 NOTE — Telephone Encounter (Signed)
-----   Message from Loreatha Rodney sent at 06/21/2023  2:27 PM EDT ----- I reviewed her labs.  Her phosphorus is about the same.   Could we please make sure she has been taking PhosLo  with meals and has cut down futibatinib  to 4 tablets. Thanks ----- Message ----- From: Dannis Dy, Lab In [place] Sent: 06/21/2023   1:01 PM EDT To: Loreatha Rodney, MD

## 2023-06-23 ENCOUNTER — Encounter: Payer: Self-pay | Admitting: Internal Medicine

## 2023-06-23 ENCOUNTER — Other Ambulatory Visit: Payer: Self-pay

## 2023-06-23 MED ORDER — ONDANSETRON HCL 8 MG PO TABS: 8.0000 mg | ORAL_TABLET | Freq: Three times a day (TID) | ORAL | 0 refills | Status: DC | PRN

## 2023-06-23 NOTE — Telephone Encounter (Signed)
 Called and spoke with patient in regards to the [facility] refill request for the compazine . So Vicki Phillips told me that she has plenty of that, what she really needed is to get her Vicki Phillips  refilled. I told her that I would put it in, and then after getting off the phone with patient she sends me a message letting me know: This is not on my list but it works better than the Vicki Phillips for my nausea. May I please get a refill?". So I sent Dr. Aris Bel a secure chat asking her about the Vicki Phillips  and she sent me back a thumbs up, so I sent the patient a MyChart message letting her know that the medication should be at her pharmacy sometime today. She is going to [place] for a couple of weeks, so I told her to have fun and to reach out to us  if she needed anything.

## 2023-06-24 ENCOUNTER — Inpatient Hospital Stay: Attending: Internal Medicine

## 2023-06-24 DIAGNOSIS — R5383 Other fatigue: Secondary | ICD-10-CM | POA: Diagnosis not present

## 2023-06-24 DIAGNOSIS — R059 Cough, unspecified: Secondary | ICD-10-CM | POA: Diagnosis not present

## 2023-06-24 DIAGNOSIS — Z79899 Other long term (current) drug therapy: Secondary | ICD-10-CM | POA: Diagnosis not present

## 2023-06-24 DIAGNOSIS — D869 Sarcoidosis, unspecified: Secondary | ICD-10-CM | POA: Insufficient documentation

## 2023-06-24 DIAGNOSIS — R110 Nausea: Secondary | ICD-10-CM | POA: Diagnosis not present

## 2023-06-24 DIAGNOSIS — Z7901 Long term (current) use of anticoagulants: Secondary | ICD-10-CM | POA: Insufficient documentation

## 2023-06-24 DIAGNOSIS — M2550 Pain in unspecified joint: Secondary | ICD-10-CM | POA: Diagnosis not present

## 2023-06-24 DIAGNOSIS — Z9221 Personal history of antineoplastic chemotherapy: Secondary | ICD-10-CM | POA: Diagnosis not present

## 2023-06-24 DIAGNOSIS — C221 Intrahepatic bile duct carcinoma: Secondary | ICD-10-CM | POA: Diagnosis not present

## 2023-06-24 DIAGNOSIS — I824Z2 Acute embolism and thrombosis of unspecified deep veins of left distal lower extremity: Secondary | ICD-10-CM | POA: Diagnosis not present

## 2023-06-24 DIAGNOSIS — R630 Anorexia: Secondary | ICD-10-CM | POA: Insufficient documentation

## 2023-06-24 DIAGNOSIS — R197 Diarrhea, unspecified: Secondary | ICD-10-CM | POA: Insufficient documentation

## 2023-06-24 DIAGNOSIS — M79662 Pain in left lower leg: Secondary | ICD-10-CM | POA: Diagnosis not present

## 2023-06-24 DIAGNOSIS — I10 Essential (primary) hypertension: Secondary | ICD-10-CM | POA: Diagnosis not present

## 2023-06-24 DIAGNOSIS — M549 Dorsalgia, unspecified: Secondary | ICD-10-CM | POA: Diagnosis not present

## 2023-06-24 LAB — CMP (CANCER CENTER ONLY)
ALT: 34 U/L (ref 0–44)
AST: 44 U/L — ABNORMAL HIGH (ref 15–41)
Albumin: 3.6 g/dL (ref 3.5–5.0)
Alkaline Phosphatase: 104 U/L (ref 38–126)
Anion gap: 11 (ref 5–15)
BUN: 12 mg/dL (ref 6–20)
CO2: 24 mmol/L (ref 22–32)
Calcium: 9.2 mg/dL (ref 8.9–10.3)
Chloride: 103 mmol/L (ref 98–111)
Creatinine: 0.96 mg/dL (ref 0.44–1.00)
GFR, Estimated: >60 mL/min (ref >60)
Glucose, Bld: 115 mg/dL — ABNORMAL HIGH (ref 70–99)
Potassium: 4 mmol/L (ref 3.5–5.1)
Sodium: 138 mmol/L (ref 135–145)
Total Bilirubin: 0.5 mg/dL (ref 0.0–1.2)
Total Protein: 7.1 g/dL (ref 6.5–8.1)

## 2023-06-24 LAB — CBC WITH DIFFERENTIAL (CANCER CENTER ONLY)
Abs Immature Granulocytes: 0.01 10*3/uL (ref 0.00–0.07)
Basophils Absolute: 0 10*3/uL (ref 0.0–0.1)
Basophils Relative: 1 %
Eosinophils Absolute: 0.2 10*3/uL (ref 0.0–0.5)
Eosinophils Relative: 6 %
HCT: 37 % (ref 36.0–46.0)
Hemoglobin: 12.6 g/dL (ref 12.0–15.0)
Immature Granulocytes: 0 %
Lymphocytes Relative: 22 %
Lymphs Abs: 0.8 10*3/uL (ref 0.7–4.0)
MCH: 36.5 pg — ABNORMAL HIGH (ref 26.0–34.0)
MCHC: 34.1 g/dL (ref 30.0–36.0)
MCV: 107.2 fL — ABNORMAL HIGH (ref 80.0–100.0)
Monocytes Absolute: 0.4 10*3/uL (ref 0.1–1.0)
Monocytes Relative: 10 %
Neutro Abs: 2.3 10*3/uL (ref 1.7–7.7)
Neutrophils Relative %: 61 %
Platelet Count: 167 10*3/uL (ref 150–400)
RBC: 3.45 MIL/uL — ABNORMAL LOW (ref 3.87–5.11)
RDW: 12.4 % (ref 11.5–15.5)
WBC Count: 3.7 10*3/uL — ABNORMAL LOW (ref 4.0–10.5)
nRBC: 0 % (ref 0.0–0.2)

## 2023-06-24 LAB — PHOSPHORUS: Phosphorus: 6.6 mg/dL — ABNORMAL HIGH (ref 2.5–4.6)

## 2023-06-25 ENCOUNTER — Ambulatory Visit

## 2023-06-25 ENCOUNTER — Other Ambulatory Visit

## 2023-07-02 ENCOUNTER — Inpatient Hospital Stay

## 2023-07-02 DIAGNOSIS — Z79899 Other long term (current) drug therapy: Secondary | ICD-10-CM | POA: Diagnosis not present

## 2023-07-02 DIAGNOSIS — R110 Nausea: Secondary | ICD-10-CM | POA: Diagnosis not present

## 2023-07-02 DIAGNOSIS — I10 Essential (primary) hypertension: Secondary | ICD-10-CM | POA: Diagnosis not present

## 2023-07-02 DIAGNOSIS — Z7901 Long term (current) use of anticoagulants: Secondary | ICD-10-CM | POA: Diagnosis not present

## 2023-07-02 DIAGNOSIS — D869 Sarcoidosis, unspecified: Secondary | ICD-10-CM | POA: Diagnosis not present

## 2023-07-02 DIAGNOSIS — R059 Cough, unspecified: Secondary | ICD-10-CM | POA: Diagnosis not present

## 2023-07-02 DIAGNOSIS — M549 Dorsalgia, unspecified: Secondary | ICD-10-CM | POA: Diagnosis not present

## 2023-07-02 DIAGNOSIS — C221 Intrahepatic bile duct carcinoma: Secondary | ICD-10-CM

## 2023-07-02 DIAGNOSIS — M2550 Pain in unspecified joint: Secondary | ICD-10-CM | POA: Diagnosis not present

## 2023-07-02 DIAGNOSIS — R630 Anorexia: Secondary | ICD-10-CM | POA: Diagnosis not present

## 2023-07-02 DIAGNOSIS — I824Z2 Acute embolism and thrombosis of unspecified deep veins of left distal lower extremity: Secondary | ICD-10-CM | POA: Diagnosis not present

## 2023-07-02 DIAGNOSIS — R5383 Other fatigue: Secondary | ICD-10-CM | POA: Diagnosis not present

## 2023-07-02 DIAGNOSIS — M79662 Pain in left lower leg: Secondary | ICD-10-CM | POA: Diagnosis not present

## 2023-07-02 DIAGNOSIS — R197 Diarrhea, unspecified: Secondary | ICD-10-CM | POA: Diagnosis not present

## 2023-07-02 DIAGNOSIS — Z9221 Personal history of antineoplastic chemotherapy: Secondary | ICD-10-CM | POA: Diagnosis not present

## 2023-07-02 LAB — PHOSPHORUS: Phosphorus: 4.5 mg/dL (ref 2.5–4.6)

## 2023-07-07 ENCOUNTER — Other Ambulatory Visit: Payer: Self-pay | Admitting: *Deleted

## 2023-07-08 ENCOUNTER — Inpatient Hospital Stay

## 2023-07-08 DIAGNOSIS — R630 Anorexia: Secondary | ICD-10-CM | POA: Diagnosis not present

## 2023-07-08 DIAGNOSIS — R059 Cough, unspecified: Secondary | ICD-10-CM | POA: Diagnosis not present

## 2023-07-08 DIAGNOSIS — R197 Diarrhea, unspecified: Secondary | ICD-10-CM | POA: Diagnosis not present

## 2023-07-08 DIAGNOSIS — M79662 Pain in left lower leg: Secondary | ICD-10-CM | POA: Diagnosis not present

## 2023-07-08 DIAGNOSIS — C221 Intrahepatic bile duct carcinoma: Secondary | ICD-10-CM

## 2023-07-08 DIAGNOSIS — M2550 Pain in unspecified joint: Secondary | ICD-10-CM | POA: Diagnosis not present

## 2023-07-08 DIAGNOSIS — I10 Essential (primary) hypertension: Secondary | ICD-10-CM | POA: Diagnosis not present

## 2023-07-08 DIAGNOSIS — Z9221 Personal history of antineoplastic chemotherapy: Secondary | ICD-10-CM | POA: Diagnosis not present

## 2023-07-08 DIAGNOSIS — Z7901 Long term (current) use of anticoagulants: Secondary | ICD-10-CM | POA: Diagnosis not present

## 2023-07-08 DIAGNOSIS — I824Z2 Acute embolism and thrombosis of unspecified deep veins of left distal lower extremity: Secondary | ICD-10-CM | POA: Diagnosis not present

## 2023-07-08 DIAGNOSIS — Z79899 Other long term (current) drug therapy: Secondary | ICD-10-CM

## 2023-07-08 DIAGNOSIS — R5383 Other fatigue: Secondary | ICD-10-CM | POA: Diagnosis not present

## 2023-07-08 DIAGNOSIS — D869 Sarcoidosis, unspecified: Secondary | ICD-10-CM | POA: Diagnosis not present

## 2023-07-08 DIAGNOSIS — R110 Nausea: Secondary | ICD-10-CM | POA: Diagnosis not present

## 2023-07-08 DIAGNOSIS — M549 Dorsalgia, unspecified: Secondary | ICD-10-CM | POA: Diagnosis not present

## 2023-07-08 LAB — PHOSPHORUS: Phosphorus: 6.2 mg/dL — ABNORMAL HIGH (ref 2.5–4.6)

## 2023-07-12 ENCOUNTER — Other Ambulatory Visit: Payer: Self-pay

## 2023-07-12 ENCOUNTER — Other Ambulatory Visit: Payer: Self-pay | Admitting: Pharmacy Technician

## 2023-07-12 ENCOUNTER — Encounter: Payer: Self-pay | Admitting: Internal Medicine

## 2023-07-12 ENCOUNTER — Other Ambulatory Visit: Payer: Self-pay | Admitting: Internal Medicine

## 2023-07-12 ENCOUNTER — Emergency Department

## 2023-07-12 ENCOUNTER — Encounter: Payer: Self-pay | Admitting: Emergency Medicine

## 2023-07-12 ENCOUNTER — Inpatient Hospital Stay

## 2023-07-12 ENCOUNTER — Inpatient Hospital Stay: Admitting: Internal Medicine

## 2023-07-12 ENCOUNTER — Emergency Department
Admission: EM | Admit: 2023-07-12 | Discharge: 2023-07-12 | Disposition: A | Discharge status: Home / Self Care | Attending: Emergency Medicine | Admitting: Emergency Medicine

## 2023-07-12 ENCOUNTER — Other Ambulatory Visit

## 2023-07-12 VITALS — BP 111/75 | HR 95 | Temp 98.1°F | Ht 69.0 in | Wt 229.1 lb

## 2023-07-12 DIAGNOSIS — C221 Intrahepatic bile duct carcinoma: Secondary | ICD-10-CM

## 2023-07-12 DIAGNOSIS — Z9221 Personal history of antineoplastic chemotherapy: Secondary | ICD-10-CM | POA: Diagnosis not present

## 2023-07-12 DIAGNOSIS — R110 Nausea: Secondary | ICD-10-CM | POA: Diagnosis not present

## 2023-07-12 DIAGNOSIS — Z95828 Presence of other vascular implants and grafts: Secondary | ICD-10-CM

## 2023-07-12 DIAGNOSIS — M79662 Pain in left lower leg: Secondary | ICD-10-CM

## 2023-07-12 DIAGNOSIS — M549 Dorsalgia, unspecified: Secondary | ICD-10-CM | POA: Diagnosis not present

## 2023-07-12 DIAGNOSIS — Z79899 Other long term (current) drug therapy: Secondary | ICD-10-CM | POA: Diagnosis not present

## 2023-07-12 DIAGNOSIS — R059 Cough, unspecified: Secondary | ICD-10-CM | POA: Diagnosis not present

## 2023-07-12 DIAGNOSIS — R197 Diarrhea, unspecified: Secondary | ICD-10-CM | POA: Diagnosis not present

## 2023-07-12 DIAGNOSIS — R630 Anorexia: Secondary | ICD-10-CM | POA: Diagnosis not present

## 2023-07-12 DIAGNOSIS — Z7901 Long term (current) use of anticoagulants: Secondary | ICD-10-CM | POA: Diagnosis not present

## 2023-07-12 DIAGNOSIS — I824Z2 Acute embolism and thrombosis of unspecified deep veins of left distal lower extremity: Secondary | ICD-10-CM | POA: Insufficient documentation

## 2023-07-12 DIAGNOSIS — D869 Sarcoidosis, unspecified: Secondary | ICD-10-CM | POA: Diagnosis not present

## 2023-07-12 DIAGNOSIS — R5383 Other fatigue: Secondary | ICD-10-CM | POA: Diagnosis not present

## 2023-07-12 DIAGNOSIS — M2550 Pain in unspecified joint: Secondary | ICD-10-CM | POA: Diagnosis not present

## 2023-07-12 DIAGNOSIS — M7989 Other specified soft tissue disorders: Secondary | ICD-10-CM | POA: Diagnosis not present

## 2023-07-12 DIAGNOSIS — I10 Essential (primary) hypertension: Secondary | ICD-10-CM | POA: Diagnosis not present

## 2023-07-12 DIAGNOSIS — I82402 Acute embolism and thrombosis of unspecified deep veins of left lower extremity: Secondary | ICD-10-CM | POA: Diagnosis not present

## 2023-07-12 LAB — CMP (CANCER CENTER ONLY)
ALT: 31 U/L (ref 0–44)
AST: 50 U/L — ABNORMAL HIGH (ref 15–41)
Albumin: 3.8 g/dL (ref 3.5–5.0)
Alkaline Phosphatase: 113 U/L (ref 38–126)
Anion gap: 10 (ref 5–15)
BUN: 12 mg/dL (ref 6–20)
CO2: 23 mmol/L (ref 22–32)
Calcium: 8.8 mg/dL — ABNORMAL LOW (ref 8.9–10.3)
Chloride: 103 mmol/L (ref 98–111)
Creatinine: 0.89 mg/dL (ref 0.44–1.00)
GFR, Estimated: >60 mL/min (ref >60)
Glucose, Bld: 105 mg/dL — ABNORMAL HIGH (ref 70–99)
Potassium: 3.5 mmol/L (ref 3.5–5.1)
Sodium: 136 mmol/L (ref 135–145)
Total Bilirubin: 1 mg/dL (ref 0.0–1.2)
Total Protein: 7.4 g/dL (ref 6.5–8.1)

## 2023-07-12 LAB — CBC WITH DIFFERENTIAL (CANCER CENTER ONLY)
Abs Immature Granulocytes: 0.01 10*3/uL (ref 0.00–0.07)
Basophils Absolute: 0 10*3/uL (ref 0.0–0.1)
Basophils Relative: 1 %
Eosinophils Absolute: 0.1 10*3/uL (ref 0.0–0.5)
Eosinophils Relative: 4 %
HCT: 38.1 % (ref 36.0–46.0)
Hemoglobin: 12.8 g/dL (ref 12.0–15.0)
Immature Granulocytes: 0 %
Lymphocytes Relative: 28 %
Lymphs Abs: 1 10*3/uL (ref 0.7–4.0)
MCH: 35.7 pg — ABNORMAL HIGH (ref 26.0–34.0)
MCHC: 33.6 g/dL (ref 30.0–36.0)
MCV: 106.1 fL — ABNORMAL HIGH (ref 80.0–100.0)
Monocytes Absolute: 0.4 10*3/uL (ref 0.1–1.0)
Monocytes Relative: 10 %
Neutro Abs: 2 10*3/uL (ref 1.7–7.7)
Neutrophils Relative %: 57 %
Platelet Count: 178 10*3/uL (ref 150–400)
RBC: 3.59 MIL/uL — ABNORMAL LOW (ref 3.87–5.11)
RDW: 12.5 % (ref 11.5–15.5)
WBC Count: 3.5 10*3/uL — ABNORMAL LOW (ref 4.0–10.5)
nRBC: 0 % (ref 0.0–0.2)

## 2023-07-12 LAB — PHOSPHORUS: Phosphorus: 6.1 mg/dL — ABNORMAL HIGH (ref 2.5–4.6)

## 2023-07-12 MED ORDER — APIXABAN (ELIQUIS) VTE STARTER PACK (10MG AND 5MG)
ORAL_TABLET | ORAL | 0 refills | Status: DC
Start: 2023-07-12 — End: 2023-08-16

## 2023-07-12 MED ORDER — HEPARIN SOD (PORK) LOCK FLUSH 100 UNIT/ML IV SOLN
500.0000 [IU] | Freq: Once | INTRAVENOUS | Status: AC
Start: 2023-07-12 — End: 2023-07-12
  Administered 2023-07-12: 500 [IU] via INTRAVENOUS
  Filled 2023-07-12: qty 5

## 2023-07-12 MED ORDER — APIXABAN 5 MG PO TABS
10.0000 mg | ORAL_TABLET | Freq: Once | ORAL | Status: AC
  Administered 2023-07-12: 10 mg via ORAL
  Filled 2023-07-12: qty 2

## 2023-07-12 MED ORDER — SODIUM CHLORIDE 0.9% FLUSH
10.0000 mL | INTRAVENOUS | Status: DC | PRN
  Administered 2023-07-12: 10 mL via INTRAVENOUS
  Filled 2023-07-12: qty 10

## 2023-07-12 MED ORDER — ONDANSETRON HCL 8 MG PO TABS: 8.0000 mg | ORAL_TABLET | Freq: Three times a day (TID) | ORAL | 0 refills | Status: DC | PRN

## 2023-07-12 NOTE — Progress Notes (Signed)
 C/o left leg pain. Could be from a lot of walking while in [place]. Also, having back pain, not sure why, x2 weeks.  Pt down to 3 tablets of the lytgobi  due to hand and feet pain. Does she need to go back to 4 pills.  Refill zofran , pended. She states the compazine  gives her anxiety.   No appetite, no supplement drinks. Having dry mouth. Having nausea, vomiting and diarrhea, has 3-4 Bms per day. The anti-diarrhea gave her constipation.  She is having some DOE.

## 2023-07-12 NOTE — Progress Notes (Signed)
 Pt being sent to ED post visit at [facility]. Celled Amber in Triage in ED. Reported Dx, recent travel and family Hx of DVT's. Left leg is swollen, tender and warm to touch.

## 2023-07-12 NOTE — Assessment & Plan Note (Addendum)
#   Intrahepatic Cholangiocarcinoma, stage IV- - s/p liver biopsy on 08/26/2022 showed metastatic moderately differentiated adenocarcinoma.  IHC stain positive for CK7 and CK20.  TTF-1, PAX8, GATA3 and CDX2 are negative.  Differentials include cholangiocarcinoma and pancreatic carcinoma. -PD-L1 0%.  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 stage IV intrahepatic cholangiocarcinoma- Cisplatin  25 mg/m/gemcitabine  1000 mg/m/Durvalumab  -completed 8 cycles on 03/04/2023; Maintenance Durvalumab /gemcitabine -started 03/26/2023- l March 2025- ith rising CA 19-9, CT chest abdomen pelvis was done which unfortunately showed progression of the lung nodules. Full dose 20 mg on 06/04/2023.     - Based on FGFR 2 gene rearrangement onfutibatinib 12 mg. Weekly phosphorus level check.  Started on PhosLo  2 tabs with every meals.  She will need OCT testing and eye exam every 2 months for 6 months and then every 3 months.    # LEFT lower extreimity swelling-  LE Dopplers- STAT-significantly concern for DVT-given the active malignancy chemotherapy physical exam and also recent travel-recommend Evaluation in emergency room and recommend anticoagulation of possible DVT.   # Hyperphosphatemia: -Secondary to futibatinib .  Management as above. Will check weekly cbc.    # Nausea/poor appetite- : recommend zofran /compazine  [anxiety]; recommend commplaince with zyprexa - 10 mg at bedtime-    # Diarrhea: -Chronic.  Imodium as needed.  Would like to hold off on Lomotil.   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 -Improving since off steroid.  Will monitor for now.  # Hypertension: -Continue with amlodipine  5 mg daily.    # Cough: -Continue with Tussionex as needed.  Improved with futibatinib .   # Poor appetite: -Patient is open to start appetite stimulant.  Sent prescription for olanzapine  10 mg once daily.   # History of sarcoidosis- -Management per pulmonary; -Never required  treatment.  # IV access; port flush q 2 M  4/19-  # DISPOSITION:  # send to ER for LE Dopplers- STAT # follow up in 1 month- MD: labs- cbc/cmp; ca- 19-9-Dr.B  # 40 minutes face-to-face with the patient discussing the above plan of care; more than 50% of time spent on prognosis/ natural history; counseling and coordination.  Addendum: MAY 19th, 2025- Ultrasound exam done in the emergency room showed DVT of the left lower extremity.  Patient started on anticoagulation.  Patient will follow up in clinic in 1 month.  Recheck weekly phosphorus levels.Aaron Aas

## 2023-07-12 NOTE — ED Triage Notes (Signed)
 Pt to ED via POV. Pt was sent in for possible DVT. Pt states that she just flew back from [place]. Pt is having swelling in her left leg and redness. Pt does not have a hx/o blood clots and is not in blood thinners. Pt denies injury to her leg.

## 2023-07-12 NOTE — Patient Instructions (Signed)

## 2023-07-12 NOTE — Progress Notes (Signed)
 [facility] CONSULT NOTE  Patient Care Team: Sheron Dixons, MD as PCP - General (Internal Medicine) Drake Gens, RN as Oncology Nurse Navigator Loreatha Rodney, MD as Consulting Physician (Oncology)  CHIEF COMPLAINTS/PURPOSE OF CONSULTATION: cholangiocarcinoma  Oncology History Overview Note   Cisplatin  25 mg/m/gemcitabine  1000 mg/m/Durvalumab  -completed 8 cycles on 03/04/2023 Maintenance Durvalumab /gemcitabine -started 03/26/2023   ASSESSMENT & PLAN:  Vicki Phillips 58 y.o. 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   # Intrahepatic Cholangiocarcinoma, stage IV - s/p liver biopsy on 08/26/2022 showed metastatic moderately differentiated adenocarcinoma.  IHC stain positive for CK7 and CK20.  TTF-1, PAX8, GATA3 and CDX2 are negative.  Differentials include cholangiocarcinoma and pancreatic carcinoma.    -PD-L1 0%.  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   -s/p cisplatin , gemcitabine  and Durvalumab  every 3 weeks for 8 cycles.  Then on maintenance Durvalumab  and gemcitabine  until March 2025. -With rising CA 19-9, CT chest abdomen pelvis was done which unfortunately showed progression of the lung nodules.   - Based on FGFR 2 gene rearrangement, plan was to start futibatinib  16 mg.  Patient decided to start at full dose 20 mg on 06/04/2023.  Phosphorus is elevated to 7.5.  Dosing guidelines reviewed.  Will decrease dose to 16 mg once daily.  Weekly phosphorus level check.  Started on PhosLo  2 tabs with every meals.  If phosphorus level goes below 7 in the next 2 weeks, we will continue with futibatinib  16 mg once daily.     Intrahepatic cholangiocarcinoma (HCC)  08/26/2022 Cancer Staging   Staging form: Intrahepatic Bile Duct, AJCC 8th Edition - Clinical stage from 08/26/2022: Stage IV (cM1) - Signed by Loreatha Rodney, MD on 09/03/2022 Stage prefix: Initial diagnosis    09/03/2022 Initial Diagnosis   Cholangiocarcinoma (HCC)   09/18/2022 - 04/30/2023 Chemotherapy   Patient is on Treatment Plan : BILIARY TRACT Cisplatin  + Gemcitabine  D1,8 + Durvalumab  (1500) D1 q21d / Durvalumab  (1500) q28d     Metastasis to lung (HCC)  09/03/2022 Initial Diagnosis   Metastasis to lung (HCC)   09/18/2022 - 04/30/2023 Chemotherapy   Patient is on Treatment Plan : BILIARY TRACT Cisplatin  + Gemcitabine  D1,8 + Durvalumab  (1500) D1 q21d / Durvalumab  (1500) q28d      HISTORY OF PRESENTING ILLNESS: Patient ambulating-independently. Accompanied by family- son.    Vicki Phillips 58 y.o.  female pleasant patient with a history of intrahepatic stage IV cholangiocarcinoma with metastasis to lungs is currently on- FGFR inhibitor is here for a follow up.  Currently on 3 pills sec to joint pains.  However noted to have swelling and pain of Left lower leg. Patiet just flew back from [place] last week. No cough or dyspnea. No palpitations.   Review of Systems  Constitutional:  Positive for malaise/fatigue. Negative for chills, diaphoresis, fever and weight loss.  HENT:  Negative for nosebleeds and sore throat.   Eyes:  Negative for double vision.  Respiratory:  Negative for cough, hemoptysis, sputum production, shortness of breath and wheezing.   Cardiovascular:  Positive for leg swelling. Negative for chest pain, palpitations and orthopnea.  Gastrointestinal:  Negative for abdominal pain, blood in stool, constipation, diarrhea, heartburn, melena, nausea and vomiting.  Genitourinary:  Negative for dysuria, frequency and urgency.  Musculoskeletal:  Positive for back pain and joint pain.  Skin: Negative.  Negative for itching and rash.  Neurological:  Negative for dizziness, tingling, focal weakness, weakness and headaches.  Endo/Heme/Allergies:  Does not bruise/bleed easily.  Psychiatric/Behavioral:  Negative for depression. The patient is not nervous/anxious and does not have  insomnia.     MEDICAL HISTORY:  Past Medical History:  Diagnosis Date   Allergy    Bile duct cancer (HCC)    Sarcoidosis 2011    SURGICAL HISTORY: Past Surgical History:  Procedure Laterality Date   CESAREAN SECTION     x 2   COLONOSCOPY WITH PROPOFOL  N/A 08/10/2019   Procedure: COLONOSCOPY WITH PROPOFOL ;  Surgeon: Marnee Sink, MD;  Location: [facility];  Service: Endoscopy;  Laterality: N/A;  priority 4   IR IMAGING GUIDED PORT INSERTION  09/11/2022   LUNG BIOPSY      SOCIAL HISTORY: Social History   Socioeconomic History   Marital status: Single    Spouse name: Not on file   Number of children: 2   Years of education: Not on file   Highest education level: Not on file  Occupational History   Not on file  Tobacco Use   Smoking status: Never   Smokeless tobacco: Never  Vaping Use   Vaping status: Never Used  Substance and Sexual Activity   Alcohol use: Not Currently    Alcohol/week: 4.0 standard drinks of alcohol    Types: 4 Glasses of wine per week   Drug use: No   Sexual activity: Yes    Partners: Male    Birth control/protection: Surgical  Other Topics Concern   Not on file  Social History Narrative   Not on file   Social Drivers of Health   Financial Resource Strain: Low Risk  (09/18/2022)   Overall Financial Resource Strain (CARDIA)    Difficulty of Paying Living Expenses: Not hard at all  Food Insecurity: No Food Insecurity (08/24/2022)   Hunger Vital Sign    Worried About Running Out of Food in the Last Year: Never true    Ran Out of Food in the Last Year: Never true  Transportation Needs: No Transportation Needs (08/24/2022)   PRAPARE - Administrator, Civil Service (Medical): No    Lack of Transportation (Non-Medical): No  Physical Activity: Not on file  Stress: Not on file  Social Connections: Not on file  Intimate Partner Violence: Not At Risk (08/24/2022)   Humiliation, Afraid, Rape, and Kick questionnaire    Fear of Current  or Ex-Partner: No    Emotionally Abused: No    Physically Abused: No    Sexually Abused: No    FAMILY HISTORY: Family History  Problem Relation Age of Onset   Stroke Mother    Heart disease Mother    Heart attack Father 31   Heart disease Father    Kidney disease Sister     ALLERGIES:  is allergic to other.  MEDICATIONS:  Current Outpatient Medications  Medication Sig Dispense Refill   albuterol  (VENTOLIN  HFA) 108 (90 Base) MCG/ACT inhaler Inhale 2 puffs into the lungs every 6 (six) hours as needed for wheezing or shortness of breath. 8 g 0   amLODipine  (NORVASC ) 5 MG tablet Take 1 tablet (5 mg total) by mouth daily. 90 tablet 1   calcium  acetate (PHOSLO ) 667 MG tablet Take 2 tablets (1,334 mg total) by mouth 3 (three) times daily with meals. 180 tablet 2   chlorpheniramine-HYDROcodone (TUSSIONEX) 10-8 MG/5ML Take 5 mLs by mouth at bedtime as needed for cough. 140 mL 0   prochlorperazine  (COMPAZINE ) 10 MG tablet Take 1 tablet (10 mg total) by mouth  every 6 (six) hours as needed for nausea or vomiting. 30 tablet 2   pseudoephedrine-acetaminophen  (TYLENOL  SINUS) 30-500 MG TABS tablet Take 1 tablet by mouth in the morning and at bedtime.     APIXABAN  (ELIQUIS ) VTE STARTER PACK (10MG  AND 5MG ) Take as directed on package: start with two-5mg  tablets twice daily for 7 days. On day 8, switch to one-5mg  tablet twice daily. 74 each 0   futibatinib , 12 mg daily dose, (LYTGOBI , 12 MG DAILY DOSE,) 4 MG tablet Take 3 tablets (12 mg total) by mouth daily. 84 tablet 0   loratadine (CLARITIN) 10 MG tablet Take 10 mg by mouth daily. (Patient not taking: Reported on 04/09/2023)     OLANZapine  (ZYPREXA ) 10 MG tablet Take 1 tablet (10 mg total) by mouth at bedtime. (Patient not taking: Reported on 06/14/2023) 30 tablet 0   ondansetron  (ZOFRAN ) 8 MG tablet Take 1 tablet (8 mg total) by mouth every 8 (eight) hours as needed for nausea or vomiting. 90 tablet 0   No current facility-administered medications  for this visit.    PHYSICAL EXAMINATION:   Vitals:   07/12/23 1429  BP: 111/75  Pulse: 95  Temp: 98.1 F (36.7 C)  SpO2: 97%   Filed Weights   07/12/23 1429  Weight: 229 lb 1.6 oz (103.9 kg)    LEFT LOWER extremity- Swelling and tenderness.   Physical Exam Vitals and nursing note reviewed.  HENT:     Head: Normocephalic and atraumatic.     Mouth/Throat:     Pharynx: Oropharynx is clear.  Eyes:     Extraocular Movements: Extraocular movements intact.     Pupils: Pupils are equal, round, and reactive to light.  Cardiovascular:     Rate and Rhythm: Normal rate and regular rhythm.  Pulmonary:     Comments: Decreased breath sounds bilaterally.  Abdominal:     Palpations: Abdomen is soft.  Musculoskeletal:        General: Normal range of motion.     Cervical back: Normal range of motion.  Skin:    General: Skin is warm.  Neurological:     General: No focal deficit present.     Mental Status: She is alert and oriented to person, place, and time.  Psychiatric:        Behavior: Behavior normal.        Judgment: Judgment normal.     LABORATORY DATA:  I have reviewed the data as listed Lab Results  Component Value Date   WBC 3.5 (L) 07/12/2023   HGB 12.8 07/12/2023   HCT 38.1 07/12/2023   MCV 106.1 (H) 07/12/2023   PLT 178 07/12/2023   Recent Labs    06/14/23 1001 06/24/23 1048 07/12/23 1429  NA 136 138 136  K 4.0 4.0 3.5  CL 102 103 103  CO2 22 24 23   GLUCOSE 111* 115* 105*  BUN 10 12 12   CREATININE 0.70 0.96 0.89  CALCIUM  9.8 9.2 8.8*  GFRNONAA >60 >60 >60  PROT 7.5 7.1 7.4  ALBUMIN 3.8 3.6 3.8  AST 41 44* 50*  ALT 36 34 31  ALKPHOS 119 104 113  BILITOT 0.6 0.5 1.0    RADIOGRAPHIC STUDIES: I have personally reviewed the radiological images as listed and agreed with the findings in the report. US  Venous Img Lower Unilateral Left Result Date: 07/12/2023 CLINICAL DATA:  Pain and edema EXAM: LEFT LOWER EXTREMITY VENOUS DOPPLER ULTRASOUND  TECHNIQUE: Gray-scale sonography with graded compression, as well as color Doppler and  duplex ultrasound were performed to evaluate the lower extremity deep venous systems from the level of the common femoral vein and including the common femoral, femoral, profunda femoral, popliteal and calf veins including the posterior tibial, peroneal and gastrocnemius veins when visible. The superficial great saphenous vein was also interrogated. Spectral Doppler was utilized to evaluate flow at rest and with distal augmentation maneuvers in the common femoral, femoral and popliteal veins. COMPARISON:  None Available. FINDINGS: Contralateral Common Femoral Vein: Respiratory phasicity is normal and symmetric with the symptomatic side. No evidence of thrombus. Normal compressibility. Common Femoral Vein: No evidence of thrombus. Normal compressibility, respiratory phasicity and response to augmentation. Saphenofemoral Junction: No evidence of thrombus. Normal compressibility and flow on color Doppler imaging. Profunda Femoral Vein: No evidence of thrombus. Normal compressibility and flow on color Doppler imaging. Femoral Vein: No evidence of thrombus. Normal compressibility, respiratory phasicity and response to augmentation. Popliteal Vein: Noncompressible distally. Vascular Doppler flow is present. Calf Veins: Noncompressible peroneal vein. Vascular Doppler flow is present. The posterior tibial veins are normally compressible without thrombus. Superficial Great Saphenous Vein: No evidence of thrombus. Normal compressibility. Other Findings:  None. IMPRESSION: Positive for nonocclusive, deep venous thrombosis in the left popliteal and peroneal veins. Electronically Signed   By: Rance Burrows M.D.   On: 07/12/2023 19:19     Intrahepatic cholangiocarcinoma (HCC) # Intrahepatic Cholangiocarcinoma, stage IV- - s/p liver biopsy on 08/26/2022 showed metastatic moderately differentiated adenocarcinoma.  IHC stain positive for CK7  and CK20.  TTF-1, PAX8, GATA3 and CDX2 are negative.  Differentials include cholangiocarcinoma and pancreatic carcinoma. -PD-L1 0%.  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 stage IV intrahepatic cholangiocarcinoma- Cisplatin  25 mg/m/gemcitabine  1000 mg/m/Durvalumab  -completed 8 cycles on 03/04/2023; Maintenance Durvalumab /gemcitabine -started 03/26/2023- l March 2025- ith rising CA 19-9, CT chest abdomen pelvis was done which unfortunately showed progression of the lung nodules. Full dose 20 mg on 06/04/2023.     - Based on FGFR 2 gene rearrangement onfutibatinib 12 mg. Weekly phosphorus level check.  Started on PhosLo  2 tabs with every meals.  She will need OCT testing and eye exam every 2 months for 6 months and then every 3 months.    # LEFT lower extreimity swelling-  LE Dopplers- STAT-significantly concern for DVT-given the active malignancy chemotherapy physical exam and also recent travel-recommend Evaluation in emergency room and recommend anticoagulation of possible DVT.   # Hyperphosphatemia: -Secondary to futibatinib .  Management as above. Will check weekly cbc.    # Nausea/poor appetite- : recommend zofran /compazine  [anxiety]; recommend commplaince with zyprexa - 10 mg at bedtime-    # Diarrhea: -Chronic.  Imodium as needed.  Would like to hold off on Lomotil.   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 -Improving since off steroid.  Will monitor for now.  # Hypertension: -Continue with amlodipine  5 mg daily.    # Cough: -Continue with Tussionex as needed.  Improved with futibatinib .   # Poor appetite: -Patient is open to start appetite stimulant.  Sent prescription for olanzapine  10 mg once daily.   # History of sarcoidosis- -Management per pulmonary; -Never required treatment.  # IV access; port flush q 2 M  4/19-  # DISPOSITION:  # send to ER for LE Dopplers- STAT # follow up in 1 month- MD: labs- cbc/cmp; ca-  19-9-Dr.B  # 40 minutes face-to-face with the patient discussing the above plan of care; more than 50% of time spent on prognosis/ natural history; counseling and coordination.  Addendum: MAY 19th, 2025- Ultrasound exam done in the emergency room showed DVT of the left lower extremity.  Patient started on anticoagulation.  Patient will follow up in clinic in 1 month.  Recheck weekly phosphorus levels..      Above plan of care was discussed with patient/family in detail.  My contact information was given to the patient/family.       Gwyn Leos, MD 07/13/2023 8:41 PM

## 2023-07-12 NOTE — ED Provider Notes (Signed)
 [facility] Provider Note    Event Date/Time   First MD Initiated Contact with Patient 07/12/23 1827     (approximate)   History   Leg Swelling   HPI Vicki Phillips is a 58 y.o. female presenting today for leg swelling.  Patient noted 1 week ago having mild pain in her left lower leg.  Denied any trauma.  Started having swelling in her left leg as well.  Seen by her oncology team who recommended she come to the ED to be evaluated for DVT.  Also recently had a flight from [place] back to the [place]  which preceded the symptoms.  Patient has cholangiocarcinoma and actively on chemotherapy.  No prior history of blood clots.  Not on a blood thinner.     Physical Exam   Triage Vital Signs: ED Triage Vitals [07/12/23 1625]  Encounter Vitals Group     BP (!) 138/90     Systolic BP Percentile      Diastolic BP Percentile      Pulse Rate 91     Resp 16     Temp 97.8 F (36.6 C)     Temp Source Oral     SpO2 98 %     Weight 225 lb (102.1 kg)     Height 5\' 9"  (1.753 m)     Head Circumference      Peak Flow      Pain Score 7     Pain Loc      Pain Education      Exclude from Growth Chart     Most recent vital signs: Vitals:   07/12/23 1625  BP: (!) 138/90  Pulse: 91  Resp: 16  Temp: 97.8 F (36.6 C)  SpO2: 98%   I have reviewed the vital signs. General:  Awake, alert, no acute distress. Head:  Normocephalic, Atraumatic. EENT:  PERRL, EOMI, Oral mucosa pink and moist, Neck is supple. Cardiovascular: Regular rate, 2+ distal pulses. Respiratory:  Normal respiratory effort, symmetrical expansion, no distress.   Extremities:  Moving all four extremities through full ROM without pain.  Mild tenderness palpation in the left lateral lower leg.  No obvious discernible swelling in comparison to her right leg.  No erythema Neuro:  Alert and oriented.  Interacting appropriately.   Skin:  Warm, dry, no rash.   Psych: Appropriate affect.    ED Results /  Procedures / Treatments   Labs (all labs ordered are listed, but only abnormal results are displayed) Labs Reviewed - No data to display   EKG    RADIOLOGY Independently interpreted ultrasound with evidence of nonocclusive DVTs in the left popliteal and peroneal veins.   PROCEDURES:  Critical Care performed: No  Procedures   MEDICATIONS ORDERED IN ED: Medications  apixaban  (ELIQUIS ) tablet 10 mg (has no administration in time range)     IMPRESSION / MDM / ASSESSMENT AND PLAN / ED COURSE  I reviewed the triage vital signs and the nursing notes.                              Differential diagnosis includes, but is not limited to, DVT, soft tissue hematoma, localized inflammation  Patient's presentation is most consistent with acute complicated illness / injury requiring diagnostic workup.  Patient is a 58 year old female with history of cancer on chemotherapy and recent long flight presenting today for left leg pain and swelling.  Ultrasound ordered  to evaluate for DVT.  No concern on exam for infection such as cellulitis.  Ultrasound shows evidence of nonocclusive DVT in the left popliteal and peroneal veins.  Overall, symptoms are very minimal with no proximal DVTs or occlusive DVTs present.  Patient will be started on Eliquis .  Sent a message to her oncologist for follow-up.  Also gave her referral for vascular surgery follow-up.  She has no difficulty breathing at this time.  Safe for discharge and given strict return precautions.     FINAL CLINICAL IMPRESSION(S) / ED DIAGNOSES   Final diagnoses:  DVT, lower extremity, distal, acute, left (HCC)     Rx / DC Orders   ED Discharge Orders          Ordered    APIXABAN  (ELIQUIS ) VTE STARTER PACK (10MG  AND 5MG )       Note to Pharmacy: If starter pack unavailable, substitute with seventy-four 5 mg apixaban  tabs following the above SIG directions.   07/12/23 2006    Ambulatory referral to Vascular Surgery        Comments: DVT, active cancer on chemotherapy   07/12/23 2006             Note:  This document was prepared using Dragon voice recognition software and may include unintentional dictation errors.   Kandee Orion, MD 07/12/23 2007

## 2023-07-12 NOTE — Discharge Instructions (Signed)
 Please follow-up with your oncology team as well as the vascular surgery team for ongoing outpatient managing of your DVT in your left leg.  Please return immediately to the emergency department if you have any difficulty breathing or sharp chest pains.  You can keep the leg elevated and wear compression stockings as needed to help with swelling as well.

## 2023-07-12 NOTE — Progress Notes (Signed)
 Specialty Pharmacy Refill Coordination Note  Vicki Phillips is a 58 y.o. female contacted today regarding refills of specialty medication(s) Futibatinib  (LYTGOBI )   Patient requested (Patient-Rptd) Delivery   Delivery date: (Patient-Rptd) 07/30/23   Verified address: (Patient-Rptd) [address], [place]   Medication will be filled on 07/29/23.   RR sent to MD

## 2023-07-13 ENCOUNTER — Telehealth: Payer: Self-pay | Admitting: Internal Medicine

## 2023-07-13 ENCOUNTER — Other Ambulatory Visit: Payer: Self-pay

## 2023-07-13 ENCOUNTER — Other Ambulatory Visit (HOSPITAL_COMMUNITY): Payer: Self-pay

## 2023-07-13 ENCOUNTER — Encounter: Payer: Self-pay | Admitting: Internal Medicine

## 2023-07-13 LAB — CANCER ANTIGEN 19-9: CA 19-9: 114 U/mL — ABNORMAL HIGH (ref 0–35)

## 2023-07-13 MED ORDER — FUTIBATINIB (12 MG DAILY DOSE) 4 MG PO TBPK
12.0000 mg | ORAL_TABLET | Freq: Every day | ORAL | 0 refills | Status: DC
  Filled 2023-07-13: qty 84, 28d supply, fill #0

## 2023-07-13 MED ORDER — FUTIBATINIB (20 MG DAILY DOSE) 4 MG PO TBPK
12.0000 mg | ORAL_TABLET | Freq: Every day | ORAL | 0 refills | Status: DC
  Filled ????-??-??: fill #0

## 2023-07-13 NOTE — Telephone Encounter (Signed)
 Called the patient to check on the status of the DVT-patient currently on anticoagulation.  Noted to have mild pain.  Recommend leg elevation and also icing for now.  Recommend follow-up on a weekly basis with cancer center-for lab phosphorus levels.  Please order weekly x 4  GB

## 2023-07-13 NOTE — Addendum Note (Signed)
 Addended by: Carrieann Spielberg N on: 07/13/2023 02:00 PM   Modules accepted: Orders

## 2023-07-13 NOTE — Telephone Encounter (Signed)
 Rx resent to match the current dose to the right dose pack. Previous rx was written for a 12mg  dose with a 20mg  dose pack. New rx has 12mg  dose and 12mg  dose pack matched.

## 2023-07-14 ENCOUNTER — Other Ambulatory Visit: Payer: Self-pay

## 2023-07-14 ENCOUNTER — Inpatient Hospital Stay

## 2023-07-14 DIAGNOSIS — C221 Intrahepatic bile duct carcinoma: Secondary | ICD-10-CM

## 2023-07-14 NOTE — Telephone Encounter (Signed)
 Labs are in

## 2023-07-20 ENCOUNTER — Other Ambulatory Visit: Payer: Self-pay

## 2023-07-20 DIAGNOSIS — R110 Nausea: Secondary | ICD-10-CM

## 2023-07-21 ENCOUNTER — Inpatient Hospital Stay

## 2023-07-21 DIAGNOSIS — M2550 Pain in unspecified joint: Secondary | ICD-10-CM | POA: Diagnosis not present

## 2023-07-21 DIAGNOSIS — R197 Diarrhea, unspecified: Secondary | ICD-10-CM | POA: Diagnosis not present

## 2023-07-21 DIAGNOSIS — Z79899 Other long term (current) drug therapy: Secondary | ICD-10-CM | POA: Diagnosis not present

## 2023-07-21 DIAGNOSIS — R059 Cough, unspecified: Secondary | ICD-10-CM | POA: Diagnosis not present

## 2023-07-21 DIAGNOSIS — I10 Essential (primary) hypertension: Secondary | ICD-10-CM | POA: Diagnosis not present

## 2023-07-21 DIAGNOSIS — R630 Anorexia: Secondary | ICD-10-CM | POA: Diagnosis not present

## 2023-07-21 DIAGNOSIS — M549 Dorsalgia, unspecified: Secondary | ICD-10-CM | POA: Diagnosis not present

## 2023-07-21 DIAGNOSIS — I824Z2 Acute embolism and thrombosis of unspecified deep veins of left distal lower extremity: Secondary | ICD-10-CM | POA: Diagnosis not present

## 2023-07-21 DIAGNOSIS — C221 Intrahepatic bile duct carcinoma: Secondary | ICD-10-CM

## 2023-07-21 DIAGNOSIS — Z9221 Personal history of antineoplastic chemotherapy: Secondary | ICD-10-CM | POA: Diagnosis not present

## 2023-07-21 DIAGNOSIS — R110 Nausea: Secondary | ICD-10-CM | POA: Diagnosis not present

## 2023-07-21 DIAGNOSIS — D869 Sarcoidosis, unspecified: Secondary | ICD-10-CM | POA: Diagnosis not present

## 2023-07-21 DIAGNOSIS — M79662 Pain in left lower leg: Secondary | ICD-10-CM | POA: Diagnosis not present

## 2023-07-21 DIAGNOSIS — R5383 Other fatigue: Secondary | ICD-10-CM | POA: Diagnosis not present

## 2023-07-21 DIAGNOSIS — Z7901 Long term (current) use of anticoagulants: Secondary | ICD-10-CM | POA: Diagnosis not present

## 2023-07-21 LAB — PHOSPHORUS: Phosphorus: 4.9 mg/dL — ABNORMAL HIGH (ref 2.5–4.6)

## 2023-07-22 ENCOUNTER — Encounter: Payer: Self-pay | Admitting: Internal Medicine

## 2023-07-22 ENCOUNTER — Inpatient Hospital Stay

## 2023-07-22 NOTE — Progress Notes (Signed)
 Nutrition Follow-up:  Patient identified on Malnutrition Screening report for poor po intake, weight loss  Patient with stage IV cholangiocarcinoma.  Patient on futibatinib .  Recent DVT following trip to [place].  Spoke with patient via phone.  Reports that appetite and nausea have improved after taking olanzapine  for about 3 days.  Reports that she usually has an egg for breakfast.  Lunch is soup or sandwich and dinner is [place] food or [place] food or something similar.  Snacks on protein bars in between meals.  Reports that overall volume of food is less but feeling better.      Medications: phoslo , olanzapine , zofran   Labs: phos 4.9 (5/28)  Anthropometrics:   Weight 225 lb on 5/19 04/23/23 241 lb 243 lb on 10/15/22 247 lb on 09/17/22  6% weight loss in the last 3 months   NUTRITION DIAGNOSIS: Inadequate oral intake decreased but now improving   INTERVENTION:  Continue olanzapine  as improving appetite and nausea Continue increased calories and protein rich foods  Patient has RD contact information and will contact if needed. Feels comfortable with nutrition at this time, no concerns      NEXT VISIT: no follow-up RD available if needed  Edge Mauger B. Zollie Hipp, CSO, LDN Registered Dietitian [phone]

## 2023-07-23 ENCOUNTER — Other Ambulatory Visit: Payer: Self-pay | Admitting: *Deleted

## 2023-07-23 DIAGNOSIS — C221 Intrahepatic bile duct carcinoma: Secondary | ICD-10-CM

## 2023-07-23 IMAGING — CR DG CHEST 2V
2 series · 2 of 2 positions shown · non-contrast
Comparison: 11/21/2008

CLINICAL DATA: Cough, low-grade fever

EXAM:
CHEST - 2 VIEW

[chest pa]
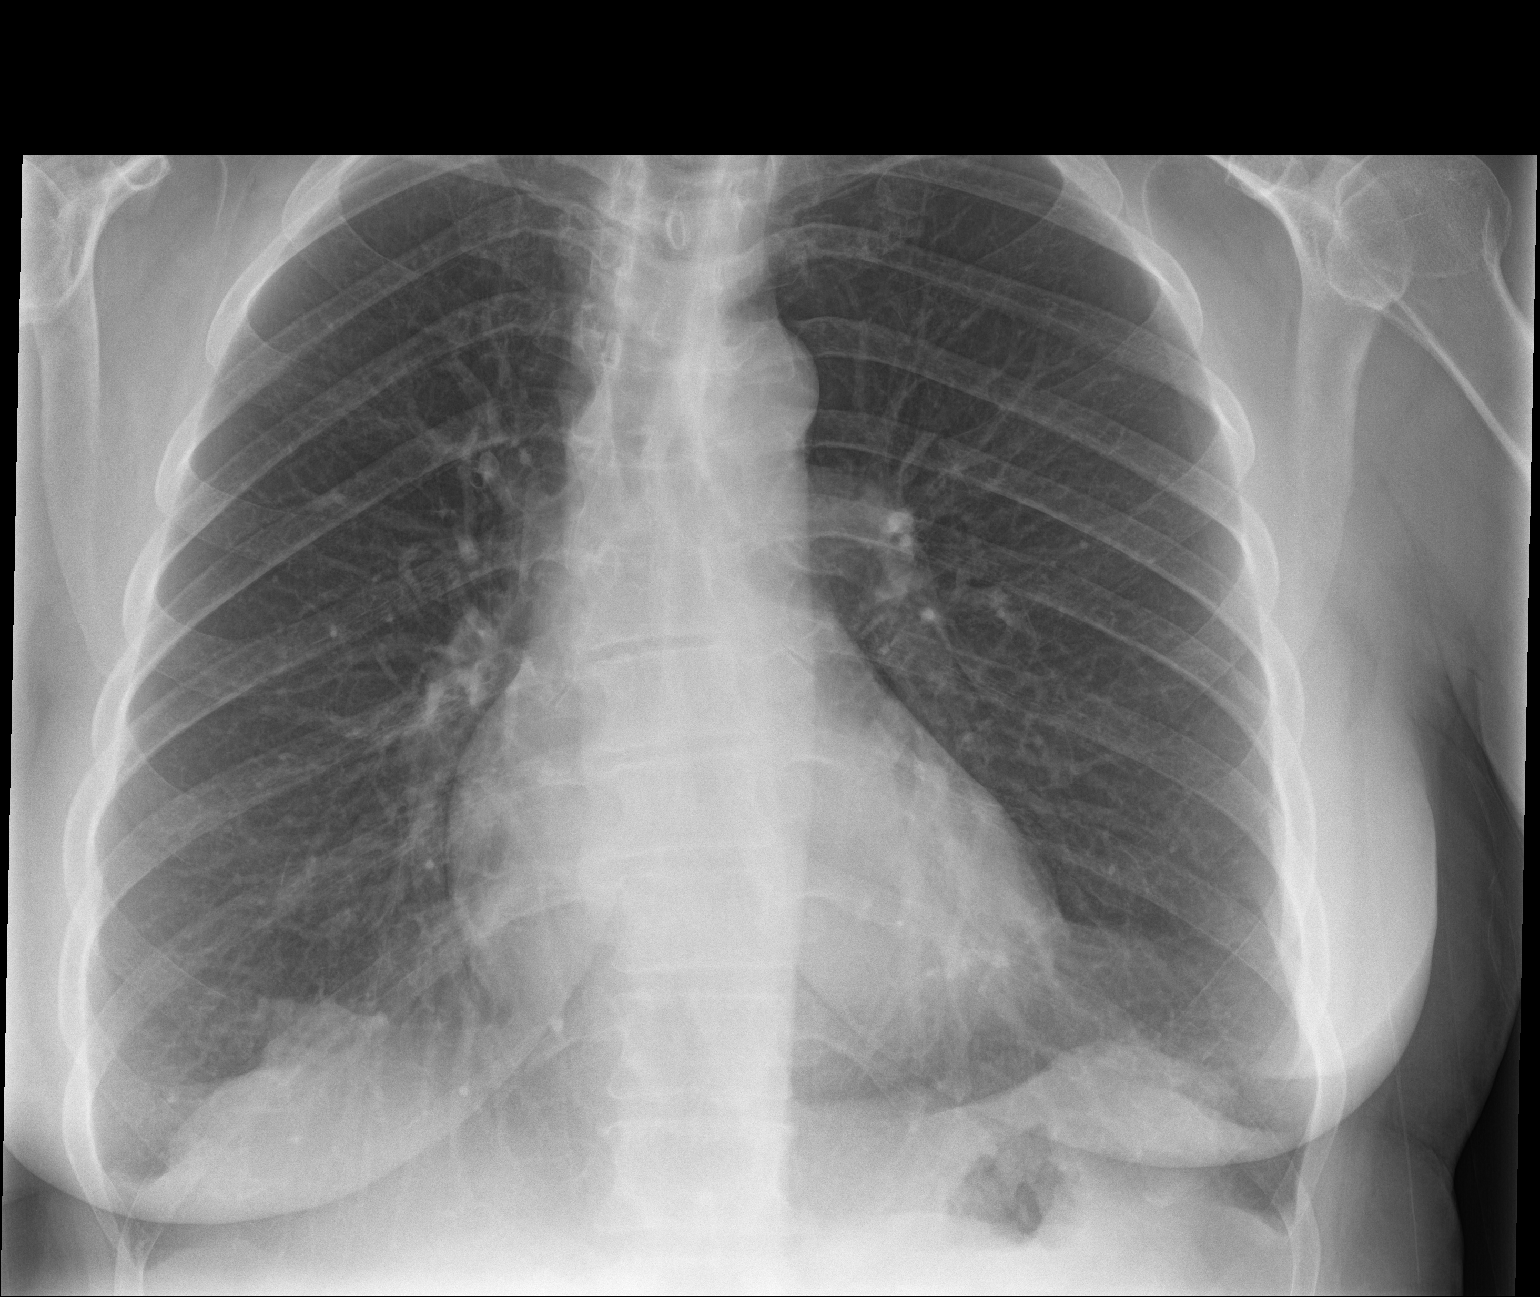

[chest lat]
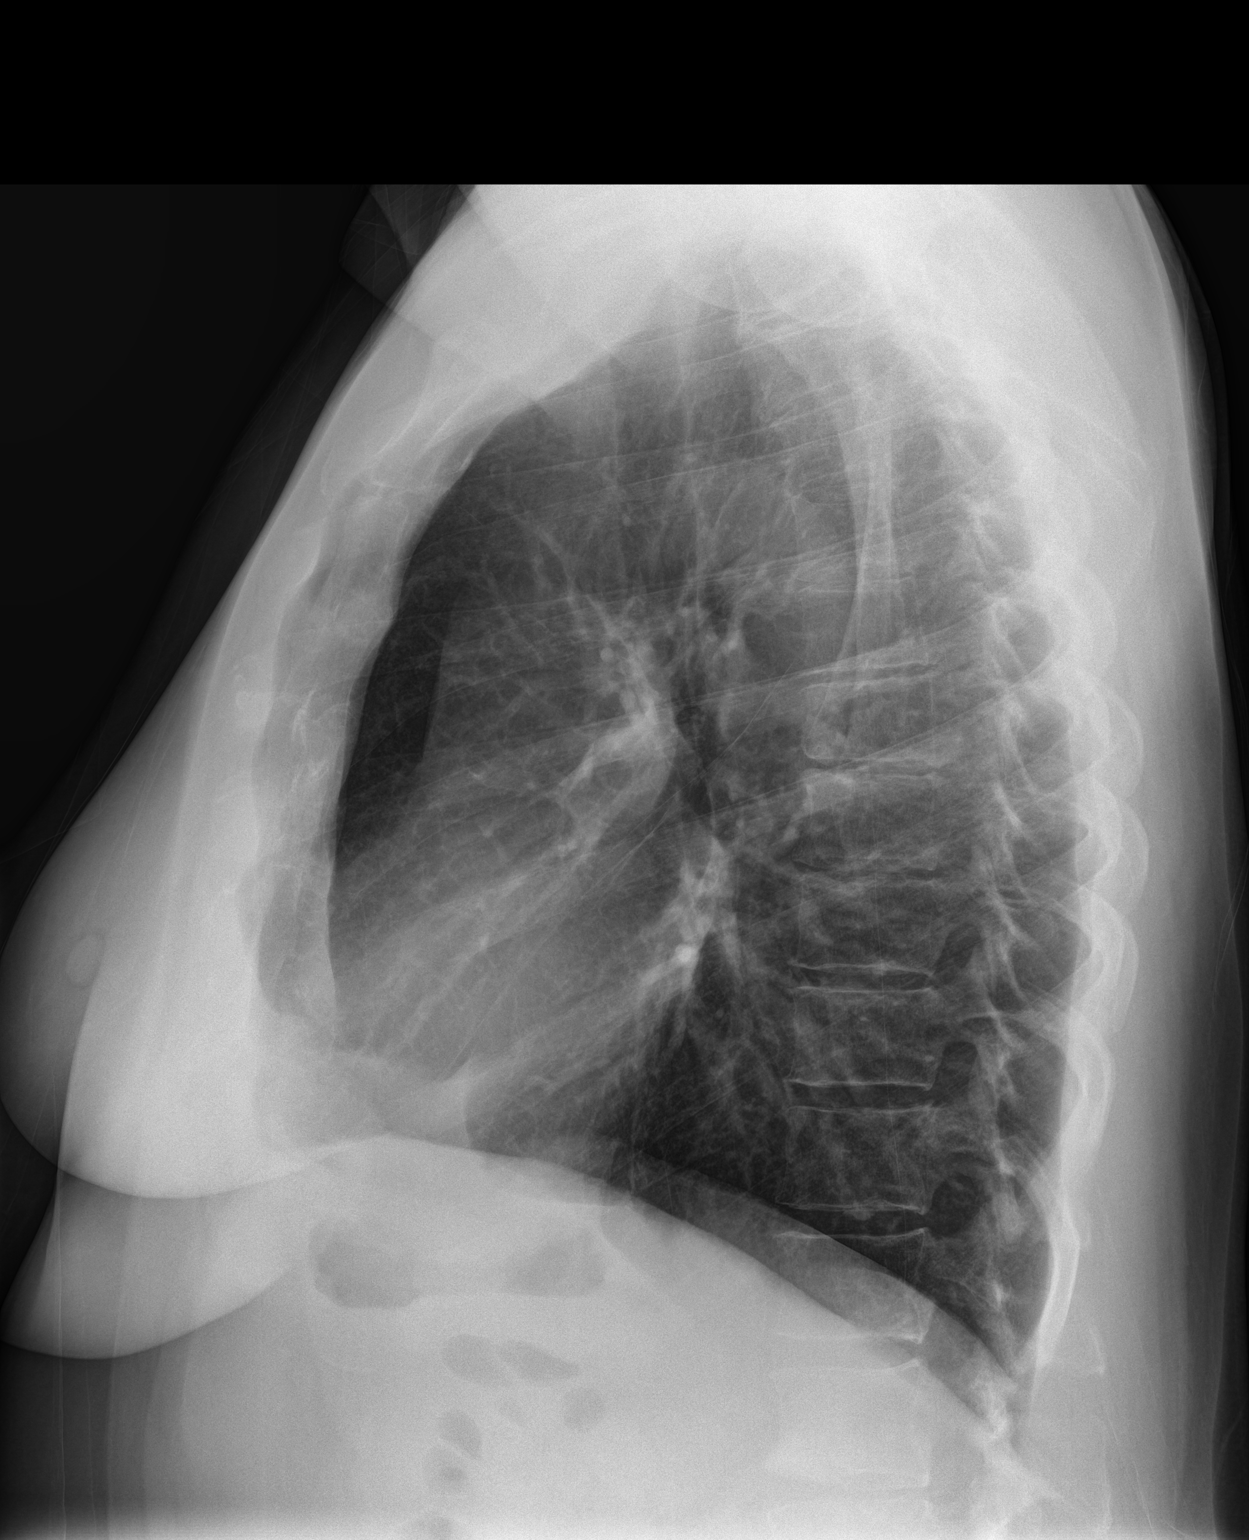

[2 of 2 positions shown; findings below may reference images not displayed]

FINDINGS: Heart and mediastinal contours are within normal limits. No focal
opacities or effusions. No acute bony abnormality.
IMPRESSION: No active cardiopulmonary disease.

## 2023-07-26 ENCOUNTER — Telehealth: Payer: Self-pay | Admitting: *Deleted

## 2023-07-26 DIAGNOSIS — Z79899 Other long term (current) drug therapy: Secondary | ICD-10-CM | POA: Diagnosis not present

## 2023-07-26 DIAGNOSIS — H35342 Macular cyst, hole, or pseudohole, left eye: Secondary | ICD-10-CM | POA: Diagnosis not present

## 2023-07-26 DIAGNOSIS — H35372 Puckering of macula, left eye: Secondary | ICD-10-CM | POA: Diagnosis not present

## 2023-07-26 DIAGNOSIS — H16223 Keratoconjunctivitis sicca, not specified as Sjogren's, bilateral: Secondary | ICD-10-CM | POA: Diagnosis not present

## 2023-07-26 DIAGNOSIS — H35722 Serous detachment of retinal pigment epithelium, left eye: Secondary | ICD-10-CM | POA: Diagnosis not present

## 2023-07-26 DIAGNOSIS — H0288A Meibomian gland dysfunction right eye, upper and lower eyelids: Secondary | ICD-10-CM | POA: Diagnosis not present

## 2023-07-26 DIAGNOSIS — L718 Other rosacea: Secondary | ICD-10-CM | POA: Diagnosis not present

## 2023-07-26 DIAGNOSIS — H0288B Meibomian gland dysfunction left eye, upper and lower eyelids: Secondary | ICD-10-CM | POA: Diagnosis not present

## 2023-07-26 NOTE — Telephone Encounter (Signed)
 MD at [facility] , the patient was a Vicki Phillips patient and she wanted to speak to you today  about her exam findings with eye exam and she is on Lytgobi . MD Leward Record at cell phone is [phone] she wants to talk to her before the patient

## 2023-07-27 ENCOUNTER — Inpatient Hospital Stay (HOSPITAL_BASED_OUTPATIENT_CLINIC_OR_DEPARTMENT_OTHER): Admitting: Nurse Practitioner

## 2023-07-27 ENCOUNTER — Encounter: Payer: Self-pay | Admitting: Nurse Practitioner

## 2023-07-27 ENCOUNTER — Inpatient Hospital Stay: Attending: Internal Medicine

## 2023-07-27 VITALS — BP 116/94 | HR 103 | Temp 96.7°F | Resp 16 | Wt 228.0 lb

## 2023-07-27 DIAGNOSIS — R300 Dysuria: Secondary | ICD-10-CM | POA: Insufficient documentation

## 2023-07-27 DIAGNOSIS — Z86718 Personal history of other venous thrombosis and embolism: Secondary | ICD-10-CM | POA: Diagnosis not present

## 2023-07-27 DIAGNOSIS — Z7901 Long term (current) use of anticoagulants: Secondary | ICD-10-CM | POA: Diagnosis not present

## 2023-07-27 DIAGNOSIS — R197 Diarrhea, unspecified: Secondary | ICD-10-CM | POA: Diagnosis not present

## 2023-07-27 DIAGNOSIS — Z5111 Encounter for antineoplastic chemotherapy: Secondary | ICD-10-CM | POA: Diagnosis not present

## 2023-07-27 DIAGNOSIS — R059 Cough, unspecified: Secondary | ICD-10-CM | POA: Insufficient documentation

## 2023-07-27 DIAGNOSIS — C221 Intrahepatic bile duct carcinoma: Secondary | ICD-10-CM | POA: Insufficient documentation

## 2023-07-27 DIAGNOSIS — Z9221 Personal history of antineoplastic chemotherapy: Secondary | ICD-10-CM | POA: Diagnosis not present

## 2023-07-27 DIAGNOSIS — C7800 Secondary malignant neoplasm of unspecified lung: Secondary | ICD-10-CM | POA: Diagnosis not present

## 2023-07-27 DIAGNOSIS — D869 Sarcoidosis, unspecified: Secondary | ICD-10-CM | POA: Insufficient documentation

## 2023-07-27 DIAGNOSIS — Z79899 Other long term (current) drug therapy: Secondary | ICD-10-CM | POA: Diagnosis not present

## 2023-07-27 DIAGNOSIS — I10 Essential (primary) hypertension: Secondary | ICD-10-CM | POA: Diagnosis not present

## 2023-07-27 DIAGNOSIS — R630 Anorexia: Secondary | ICD-10-CM | POA: Diagnosis not present

## 2023-07-27 LAB — CMP (CANCER CENTER ONLY)
ALT: 19 U/L (ref 0–44)
AST: 30 U/L (ref 15–41)
Albumin: 3.7 g/dL (ref 3.5–5.0)
Alkaline Phosphatase: 116 U/L (ref 38–126)
Anion gap: 11 (ref 5–15)
BUN: 14 mg/dL (ref 6–20)
CO2: 21 mmol/L — ABNORMAL LOW (ref 22–32)
Calcium: 9 mg/dL (ref 8.9–10.3)
Chloride: 105 mmol/L (ref 98–111)
Creatinine: 0.81 mg/dL (ref 0.44–1.00)
GFR, Estimated: >60 mL/min (ref >60)
Glucose, Bld: 102 mg/dL — ABNORMAL HIGH (ref 70–99)
Potassium: 3.8 mmol/L (ref 3.5–5.1)
Sodium: 137 mmol/L (ref 135–145)
Total Bilirubin: 0.9 mg/dL (ref 0.0–1.2)
Total Protein: 7.2 g/dL (ref 6.5–8.1)

## 2023-07-27 LAB — URINALYSIS, COMPLETE (UACMP) WITH MICROSCOPIC
Bacteria, UA: NONE SEEN
Bilirubin Urine: NEGATIVE
Glucose, UA: NEGATIVE mg/dL
Hgb urine dipstick: NEGATIVE
Ketones, ur: NEGATIVE mg/dL
Nitrite: NEGATIVE
Protein, ur: NEGATIVE mg/dL
Specific Gravity, Urine: 1.027 (ref 1.005–1.030)
pH: 5 (ref 5.0–8.0)

## 2023-07-27 LAB — CBC WITH DIFFERENTIAL (CANCER CENTER ONLY)
Abs Immature Granulocytes: 0 10*3/uL (ref 0.00–0.07)
Basophils Absolute: 0 10*3/uL (ref 0.0–0.1)
Basophils Relative: 1 %
Eosinophils Absolute: 0.2 10*3/uL (ref 0.0–0.5)
Eosinophils Relative: 5 %
HCT: 36.9 % (ref 36.0–46.0)
Hemoglobin: 12.2 g/dL (ref 12.0–15.0)
Immature Granulocytes: 0 %
Lymphocytes Relative: 28 %
Lymphs Abs: 0.9 10*3/uL (ref 0.7–4.0)
MCH: 35.3 pg — ABNORMAL HIGH (ref 26.0–34.0)
MCHC: 33.1 g/dL (ref 30.0–36.0)
MCV: 106.6 fL — ABNORMAL HIGH (ref 80.0–100.0)
Monocytes Absolute: 0.3 10*3/uL (ref 0.1–1.0)
Monocytes Relative: 8 %
Neutro Abs: 1.9 10*3/uL (ref 1.7–7.7)
Neutrophils Relative %: 58 %
Platelet Count: 206 10*3/uL (ref 150–400)
RBC: 3.46 MIL/uL — ABNORMAL LOW (ref 3.87–5.11)
RDW: 12.3 % (ref 11.5–15.5)
WBC Count: 3.3 10*3/uL — ABNORMAL LOW (ref 4.0–10.5)
nRBC: 0 % (ref 0.0–0.2)

## 2023-07-27 LAB — PHOSPHORUS: Phosphorus: 5 mg/dL — ABNORMAL HIGH (ref 2.5–4.6)

## 2023-07-27 NOTE — Progress Notes (Signed)
 [facility] CONSULT NOTE  Patient Care Team: Sheron Dixons, MD as PCP - General (Internal Medicine) Drake Gens, RN as Oncology Nurse Navigator Gwyn Leos, MD as Consulting Physician (Oncology)  CHIEF COMPLAINTS/PURPOSE OF CONSULTATION: cholangiocarcinoma  Oncology History Overview Note   Cisplatin  25 mg/m/gemcitabine  1000 mg/m/Durvalumab  -completed 8 cycles on 03/04/2023 Maintenance Durvalumab /gemcitabine -started 03/26/2023   ASSESSMENT & PLAN:  Vicki Phillips 58 y.o. 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   # Intrahepatic Cholangiocarcinoma, stage IV - s/p liver biopsy on 08/26/2022 showed metastatic moderately differentiated adenocarcinoma.  IHC stain positive for CK7 and CK20.  TTF-1, PAX8, GATA3 and CDX2 are negative.  Differentials include cholangiocarcinoma and pancreatic carcinoma.    -PD-L1 0%.  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   -s/p cisplatin , gemcitabine  and Durvalumab  every 3 weeks for 8 cycles.  Then on maintenance Durvalumab  and gemcitabine  until March 2025. -With rising CA 19-9, CT chest abdomen pelvis was done which unfortunately showed progression of the lung nodules.   - Based on FGFR 2 gene rearrangement, plan was to start futibatinib  16 mg.  Patient decided to start at full dose 20 mg on 06/04/2023.  Phosphorus is elevated to 7.5.  Dosing guidelines reviewed.  Will decrease dose to 16 mg once daily.  Weekly phosphorus level check.  Started on PhosLo  2 tabs with every meals.  If phosphorus level goes below 7 in the next 2 weeks, we will continue with futibatinib  16 mg once daily.     Intrahepatic cholangiocarcinoma (HCC)  08/26/2022 Cancer Staging   Staging form: Intrahepatic Bile Duct, AJCC 8th Edition - Clinical stage from 08/26/2022: Stage IV (cM1) - Signed by Loreatha Rodney, MD on 09/03/2022 Stage prefix: Initial  diagnosis   09/03/2022 Initial Diagnosis   Cholangiocarcinoma (HCC)   09/18/2022 - 04/30/2023 Chemotherapy   Patient is on Treatment Plan : BILIARY TRACT Cisplatin  + Gemcitabine  D1,8 + Durvalumab  (1500) D1 q21d / Durvalumab  (1500) q28d     Metastasis to lung (HCC)  09/03/2022 Initial Diagnosis   Metastasis to lung (HCC)   09/18/2022 - 04/30/2023 Chemotherapy   Patient is on Treatment Plan : BILIARY TRACT Cisplatin  + Gemcitabine  D1,8 + Durvalumab  (1500) D1 q21d / Durvalumab  (1500) q28d      HISTORY OF PRESENTING ILLNESS: Patient ambulating independently. Unaccompanied.  Vicki Phillips 57 y.o.  female pleasant patient with a history of intrahepatic stage IV cholangiocarcinoma with metastasis to lungs is currently on- FGFR inhibitor is here for a follow up.Dose reduced fgfr-I d/t joint pains and fatigue. She is compliant with blood thinner. She would like to hold futibatinib    Review of Systems  Constitutional:  Positive for malaise/fatigue. Negative for chills, diaphoresis, fever and weight loss.  HENT:  Negative for nosebleeds and sore throat.   Eyes:  Negative for double vision.  Respiratory:  Negative for cough, hemoptysis, sputum production, shortness of breath and wheezing.   Cardiovascular:  Positive for leg swelling. Negative for chest pain, palpitations and orthopnea.  Gastrointestinal:  Negative for abdominal pain, blood in stool, constipation, diarrhea, heartburn, melena, nausea and vomiting.  Genitourinary:  Negative for dysuria, frequency and urgency.  Musculoskeletal:  Positive for back pain and joint pain.  Skin: Negative.  Negative for itching and rash.  Neurological:  Negative for dizziness, tingling, focal weakness, weakness and headaches.  Endo/Heme/Allergies:  Does not bruise/bleed easily.  Psychiatric/Behavioral:  Negative for depression. The patient is not nervous/anxious and does  not have insomnia.     MEDICAL HISTORY:  Past Medical History:  Diagnosis Date    Allergy    Bile duct cancer (HCC)    Sarcoidosis 2011    SURGICAL HISTORY: Past Surgical History:  Procedure Laterality Date   CESAREAN SECTION     x 2   COLONOSCOPY WITH PROPOFOL  N/A 08/10/2019   Procedure: COLONOSCOPY WITH PROPOFOL ;  Surgeon: Marnee Sink, MD;  Location: [facility];  Service: Endoscopy;  Laterality: N/A;  priority 4   IR IMAGING GUIDED PORT INSERTION  09/11/2022   LUNG BIOPSY      SOCIAL HISTORY: Social History   Socioeconomic History   Marital status: Single    Spouse name: Not on file   Number of children: 2   Years of education: Not on file   Highest education level: Not on file  Occupational History   Not on file  Tobacco Use   Smoking status: Never   Smokeless tobacco: Never  Vaping Use   Vaping status: Never Used  Substance and Sexual Activity   Alcohol use: Not Currently    Alcohol/week: 4.0 standard drinks of alcohol    Types: 4 Glasses of wine per week   Drug use: No   Sexual activity: Yes    Partners: Male    Birth control/protection: Surgical  Other Topics Concern   Not on file  Social History Narrative   Not on file   Social Drivers of Health   Financial Resource Strain: Low Risk  (09/18/2022)   Overall Financial Resource Strain (CARDIA)    Difficulty of Paying Living Expenses: Not hard at all  Food Insecurity: No Food Insecurity (08/24/2022)   Hunger Vital Sign    Worried About Running Out of Food in the Last Year: Never true    Ran Out of Food in the Last Year: Never true  Transportation Needs: No Transportation Needs (08/24/2022)   PRAPARE - Administrator, Civil Service (Medical): No    Lack of Transportation (Non-Medical): No  Physical Activity: Not on file  Stress: Not on file  Social Connections: Not on file  Intimate Partner Violence: Not At Risk (08/24/2022)   Humiliation, Afraid, Rape, and Kick questionnaire    Fear of Current or Ex-Partner: No    Emotionally Abused: No    Physically Abused: No     Sexually Abused: No    FAMILY HISTORY: Family History  Problem Relation Age of Onset   Stroke Mother    Heart disease Mother    Heart attack Father 56   Heart disease Father    Kidney disease Sister     ALLERGIES:  is allergic to other.  MEDICATIONS:  Current Outpatient Medications  Medication Sig Dispense Refill   amLODipine  (NORVASC ) 5 MG tablet Take 1 tablet (5 mg total) by mouth daily. 90 tablet 1   APIXABAN  (ELIQUIS ) VTE STARTER PACK (10MG  AND 5MG ) Take as directed on package: start with two-5mg  tablets twice daily for 7 days. On day 8, switch to one-5mg  tablet twice daily. 74 each 0   calcium  acetate (PHOSLO ) 667 MG tablet Take 2 tablets (1,334 mg total) by mouth 3 (three) times daily with meals. 180 tablet 2   futibatinib , 12 mg daily dose, (LYTGOBI , 12 MG DAILY DOSE,) 4 MG tablet Take 3 tablets (12 mg total) by mouth daily. 84 tablet 0   loratadine (CLARITIN) 10 MG tablet Take 10 mg by mouth daily.     OLANZapine  (ZYPREXA ) 10 MG tablet  Take 1 tablet (10 mg total) by mouth at bedtime. 30 tablet 0   albuterol  (VENTOLIN  HFA) 108 (90 Base) MCG/ACT inhaler Inhale 2 puffs into the lungs every 6 (six) hours as needed for wheezing or shortness of breath. (Patient not taking: Reported on 07/27/2023) 8 g 0   chlorpheniramine-HYDROcodone (TUSSIONEX) 10-8 MG/5ML Take 5 mLs by mouth at bedtime as needed for cough. (Patient not taking: Reported on 07/27/2023) 140 mL 0   ondansetron  (ZOFRAN ) 8 MG tablet Take 1 tablet (8 mg total) by mouth every 8 (eight) hours as needed for nausea or vomiting. (Patient not taking: Reported on 07/27/2023) 90 tablet 0   prochlorperazine  (COMPAZINE ) 10 MG tablet Take 1 tablet (10 mg total) by mouth every 6 (six) hours as needed for nausea or vomiting. (Patient not taking: Reported on 07/27/2023) 30 tablet 2   pseudoephedrine-acetaminophen  (TYLENOL  SINUS) 30-500 MG TABS tablet Take 1 tablet by mouth in the morning and at bedtime.     No current facility-administered  medications for this visit.    PHYSICAL EXAMINATION: Vitals:   07/27/23 1526  BP: (!) 116/94  Pulse: (!) 103  Resp: 16  Temp: (!) 96.7 F (35.9 C)  SpO2: 97%   Filed Weights   07/27/23 1526  Weight: 228 lb (103.4 kg)   Physical Exam Vitals reviewed.  HENT:     Head: Normocephalic and atraumatic.     Mouth/Throat:     Pharynx: Oropharynx is clear.  Pulmonary:     Comments: Decreased breath sounds bilaterally.  Abdominal:     General: There is no distension.     Palpations: Abdomen is soft.  Musculoskeletal:        General: Normal range of motion.  Skin:    General: Skin is warm.     Coloration: Skin is not pale.  Neurological:     Mental Status: She is alert and oriented to person, place, and time.  Psychiatric:        Mood and Affect: Mood normal.        Behavior: Behavior normal.     LABORATORY DATA:  I have reviewed the data as listed Lab Results  Component Value Date   WBC 3.3 (L) 07/27/2023   HGB 12.2 07/27/2023   HCT 36.9 07/27/2023   MCV 106.6 (H) 07/27/2023   PLT 206 07/27/2023   Recent Labs    06/24/23 1048 07/12/23 1429 07/27/23 1446  NA 138 136 137  K 4.0 3.5 3.8  CL 103 103 105  CO2 24 23 21*  GLUCOSE 115* 105* 102*  BUN 12 12 14   CREATININE 0.96 0.89 0.81  CALCIUM  9.2 8.8* 9.0  GFRNONAA >60 >60 >60  PROT 7.1 7.4 7.2  ALBUMIN 3.6 3.8 3.7  AST 44* 50* 30  ALT 34 31 19  ALKPHOS 104 113 116  BILITOT 0.5 1.0 0.9    RADIOGRAPHIC STUDIES: I have personally reviewed the radiological images as listed and agreed with the findings in the report. US  Venous Img Lower Unilateral Left Result Date: 07/12/2023 CLINICAL DATA:  Pain and edema EXAM: LEFT LOWER EXTREMITY VENOUS DOPPLER ULTRASOUND TECHNIQUE: Gray-scale sonography with graded compression, as well as color Doppler and duplex ultrasound were performed to evaluate the lower extremity deep venous systems from the level of the common femoral vein and including the common femoral, femoral,  profunda femoral, popliteal and calf veins including the posterior tibial, peroneal and gastrocnemius veins when visible. The superficial great saphenous vein was also interrogated. Spectral Doppler was utilized  to evaluate flow at rest and with distal augmentation maneuvers in the common femoral, femoral and popliteal veins. COMPARISON:  None Available. FINDINGS: Contralateral Common Femoral Vein: Respiratory phasicity is normal and symmetric with the symptomatic side. No evidence of thrombus. Normal compressibility. Common Femoral Vein: No evidence of thrombus. Normal compressibility, respiratory phasicity and response to augmentation. Saphenofemoral Junction: No evidence of thrombus. Normal compressibility and flow on color Doppler imaging. Profunda Femoral Vein: No evidence of thrombus. Normal compressibility and flow on color Doppler imaging. Femoral Vein: No evidence of thrombus. Normal compressibility, respiratory phasicity and response to augmentation. Popliteal Vein: Noncompressible distally. Vascular Doppler flow is present. Calf Veins: Noncompressible peroneal vein. Vascular Doppler flow is present. The posterior tibial veins are normally compressible without thrombus. Superficial Great Saphenous Vein: No evidence of thrombus. Normal compressibility. Other Findings:  None. IMPRESSION: Positive for nonocclusive, deep venous thrombosis in the left popliteal and peroneal veins. Electronically Signed   By: Rance Burrows M.D.   On: 07/12/2023 19:19    Intrahepatic cholangiocarcinoma -  # Intrahepatic Cholangiocarcinoma, stage IV- s/p liver biopsy on 08/26/2022 showed metastatic moderately differentiated adenocarcinoma.  IHC stain positive for CK7 and CK20.  TTF-1, PAX8, GATA3 and CDX2 are negative. Differentials include cholangiocarcinoma and pancreatic carcinoma. -PD-L1 0%.  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 stage IV intrahepatic cholangiocarcinoma- Cisplatin  25  mg/m/gemcitabine  1000 mg/m/Durvalumab  -completed 8 cycles on 03/04/2023; Maintenance Durvalumab /gemcitabine -started 03/26/2023- l March 2025- ith rising CA 19-9, CT chest abdomen pelvis was done which unfortunately showed progression of the lung nodules. Full dose 20 mg on 06/04/2023. Dose reduced to 12 mg day d/t side effects.   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 She is having floaters and other ocular symptoms as well. Recommendation was to hold medication. Patient in agreement with this due to ocular findings as well as aches, pains, and fatigue. Plan is for her to see her eye doctor in 4 weeks to recheck retinal findings and we can see her back at that time.   - She has been checking phosphorus levels weekly due to use of futibatinib . On PhosLo  2 tabs with every meal.   # LEFT lower extremity swelling-  u/s positive for nonocclusive DVT of left popliteal and peroneal veins. Started on anti-coagulation. Continue.   # Hyperphosphatemia: Secondary to futibatinib . Management as above.    # Nausea/poor appetite- Recommend zofran /compazine  [anxiety]; recommend compliance w/ zyprexa  10 mg at bedtime.    # Diarrhea: Chronic. Imodium as needed. Would like to hold off on Lomotil.  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 Improving since off steroid. Will monitor for now.  # Hypertension: Continue with amlodipine  5 mg daily.   # Cough: Continue with Tussionex as needed.  Improved with futibatinib . Monitor.   # Poor appetite: Patient is open to start appetite stimulant. Sent prescription for olanzapine  10 mg once daily. Weight stable.   # History of sarcoidosis: Management per pulmonary. Never required treatment.  # Dysuria- UA today pending.   # IV access; port flush q54m  4/19-   DISPOSITION:  Cancel tomorrow's appt Weekly lab appt to check phosphorus OR port/lab (pt pref) 4-5 weeks- port/labs, Dr  Valentine Gasmen- [place]  No problem-specific Assessment & Plan notes found for this encounter.  Above plan of care was discussed with patient/family in detail.  My contact information was given to the patient/family.   Nelda Balsam, NP 07/27/2023

## 2023-07-27 NOTE — Progress Notes (Signed)
 right leg having pain and swelling in right leg now and warm to touch per patient. Reports pain upon urination.  Pt states she is having difficulty with all the pain in her joints and jaw line, as well as dental and vision issues.  Pt is requesting if she could possibly "take a break from chemo pill for awhile".

## 2023-07-28 ENCOUNTER — Encounter: Payer: Self-pay | Admitting: Nurse Practitioner

## 2023-07-28 ENCOUNTER — Inpatient Hospital Stay

## 2023-07-28 ENCOUNTER — Other Ambulatory Visit: Payer: Self-pay | Admitting: *Deleted

## 2023-07-28 ENCOUNTER — Ambulatory Visit
Admission: RE | Admit: 2023-07-28 | Discharge: 2023-07-28 | Disposition: A | Discharge status: Home / Self Care | Source: Ambulatory Visit | Attending: Nurse Practitioner | Admitting: Nurse Practitioner

## 2023-07-28 DIAGNOSIS — M79604 Pain in right leg: Secondary | ICD-10-CM | POA: Insufficient documentation

## 2023-07-28 DIAGNOSIS — Z86718 Personal history of other venous thrombosis and embolism: Secondary | ICD-10-CM | POA: Insufficient documentation

## 2023-07-28 LAB — CANCER ANTIGEN 19-9: CA 19-9: 92 U/mL — ABNORMAL HIGH (ref 0–35)

## 2023-07-29 ENCOUNTER — Other Ambulatory Visit: Payer: Self-pay | Admitting: Nurse Practitioner

## 2023-07-29 ENCOUNTER — Other Ambulatory Visit: Payer: Self-pay

## 2023-07-29 LAB — URINE CULTURE: Culture: >=100000 — AB

## 2023-07-29 MED ORDER — NITROFURANTOIN MONOHYD MACRO 100 MG PO CAPS: 100.0000 mg | ORAL_CAPSULE | Freq: Two times a day (BID) | ORAL | 0 refills | Status: AC

## 2023-07-29 NOTE — Progress Notes (Signed)
 Called patient. Reviewed results of us  which was negative for dvt. UA positive for UTI. Culture > 100k colonies. Macrobid sent to pharmacy. If leg swelling/warmth worsens, recommend in person evaluation.

## 2023-08-04 ENCOUNTER — Inpatient Hospital Stay

## 2023-08-04 DIAGNOSIS — R059 Cough, unspecified: Secondary | ICD-10-CM | POA: Diagnosis not present

## 2023-08-04 DIAGNOSIS — Z79899 Other long term (current) drug therapy: Secondary | ICD-10-CM | POA: Diagnosis not present

## 2023-08-04 DIAGNOSIS — C221 Intrahepatic bile duct carcinoma: Secondary | ICD-10-CM | POA: Diagnosis not present

## 2023-08-04 DIAGNOSIS — Z9221 Personal history of antineoplastic chemotherapy: Secondary | ICD-10-CM | POA: Diagnosis not present

## 2023-08-04 DIAGNOSIS — Z7901 Long term (current) use of anticoagulants: Secondary | ICD-10-CM | POA: Diagnosis not present

## 2023-08-04 DIAGNOSIS — Z86718 Personal history of other venous thrombosis and embolism: Secondary | ICD-10-CM | POA: Diagnosis not present

## 2023-08-04 DIAGNOSIS — R197 Diarrhea, unspecified: Secondary | ICD-10-CM | POA: Diagnosis not present

## 2023-08-04 DIAGNOSIS — Z5111 Encounter for antineoplastic chemotherapy: Secondary | ICD-10-CM

## 2023-08-04 DIAGNOSIS — R630 Anorexia: Secondary | ICD-10-CM | POA: Diagnosis not present

## 2023-08-04 DIAGNOSIS — C7800 Secondary malignant neoplasm of unspecified lung: Secondary | ICD-10-CM | POA: Diagnosis not present

## 2023-08-04 DIAGNOSIS — I10 Essential (primary) hypertension: Secondary | ICD-10-CM | POA: Diagnosis not present

## 2023-08-04 DIAGNOSIS — D869 Sarcoidosis, unspecified: Secondary | ICD-10-CM | POA: Diagnosis not present

## 2023-08-04 LAB — PHOSPHORUS: Phosphorus: 2.1 mg/dL — ABNORMAL LOW (ref 2.5–4.6)

## 2023-08-09 ENCOUNTER — Other Ambulatory Visit: Payer: Self-pay | Admitting: *Deleted

## 2023-08-09 ENCOUNTER — Encounter: Payer: Self-pay | Admitting: Internal Medicine

## 2023-08-09 MED ORDER — OLANZAPINE 10 MG PO TABS: 10.0000 mg | ORAL_TABLET | Freq: Every day | ORAL | 0 refills | Status: DC

## 2023-08-11 ENCOUNTER — Inpatient Hospital Stay

## 2023-08-11 DIAGNOSIS — Z7901 Long term (current) use of anticoagulants: Secondary | ICD-10-CM | POA: Diagnosis not present

## 2023-08-11 DIAGNOSIS — I10 Essential (primary) hypertension: Secondary | ICD-10-CM | POA: Diagnosis not present

## 2023-08-11 DIAGNOSIS — D869 Sarcoidosis, unspecified: Secondary | ICD-10-CM | POA: Diagnosis not present

## 2023-08-11 DIAGNOSIS — Z79899 Other long term (current) drug therapy: Secondary | ICD-10-CM | POA: Diagnosis not present

## 2023-08-11 DIAGNOSIS — Z86718 Personal history of other venous thrombosis and embolism: Secondary | ICD-10-CM | POA: Diagnosis not present

## 2023-08-11 DIAGNOSIS — C221 Intrahepatic bile duct carcinoma: Secondary | ICD-10-CM

## 2023-08-11 DIAGNOSIS — Z5111 Encounter for antineoplastic chemotherapy: Secondary | ICD-10-CM

## 2023-08-11 DIAGNOSIS — R630 Anorexia: Secondary | ICD-10-CM | POA: Diagnosis not present

## 2023-08-11 DIAGNOSIS — R059 Cough, unspecified: Secondary | ICD-10-CM | POA: Diagnosis not present

## 2023-08-11 DIAGNOSIS — C7800 Secondary malignant neoplasm of unspecified lung: Secondary | ICD-10-CM | POA: Diagnosis not present

## 2023-08-11 DIAGNOSIS — R197 Diarrhea, unspecified: Secondary | ICD-10-CM | POA: Diagnosis not present

## 2023-08-11 DIAGNOSIS — Z9221 Personal history of antineoplastic chemotherapy: Secondary | ICD-10-CM | POA: Diagnosis not present

## 2023-08-11 LAB — CMP (CANCER CENTER ONLY)
ALT: 21 U/L (ref 0–44)
AST: 37 U/L (ref 15–41)
Albumin: 4 g/dL (ref 3.5–5.0)
Alkaline Phosphatase: 116 U/L (ref 38–126)
Anion gap: 10 (ref 5–15)
BUN: 21 mg/dL — ABNORMAL HIGH (ref 6–20)
CO2: 22 mmol/L (ref 22–32)
Calcium: 9 mg/dL (ref 8.9–10.3)
Chloride: 107 mmol/L (ref 98–111)
Creatinine: 0.98 mg/dL (ref 0.44–1.00)
GFR, Estimated: >60 mL/min (ref >60)
Glucose, Bld: 117 mg/dL — ABNORMAL HIGH (ref 70–99)
Potassium: 4 mmol/L (ref 3.5–5.1)
Sodium: 139 mmol/L (ref 135–145)
Total Bilirubin: 1.2 mg/dL (ref 0.0–1.2)
Total Protein: 7.4 g/dL (ref 6.5–8.1)

## 2023-08-11 LAB — CBC WITH DIFFERENTIAL (CANCER CENTER ONLY)
Abs Immature Granulocytes: 0.01 10*3/uL (ref 0.00–0.07)
Basophils Absolute: 0 10*3/uL (ref 0.0–0.1)
Basophils Relative: 1 %
Eosinophils Absolute: 0.2 10*3/uL (ref 0.0–0.5)
Eosinophils Relative: 5 %
HCT: 39.4 % (ref 36.0–46.0)
Hemoglobin: 13.3 g/dL (ref 12.0–15.0)
Immature Granulocytes: 0 %
Lymphocytes Relative: 19 %
Lymphs Abs: 0.7 10*3/uL (ref 0.7–4.0)
MCH: 35.7 pg — ABNORMAL HIGH (ref 26.0–34.0)
MCHC: 33.8 g/dL (ref 30.0–36.0)
MCV: 105.6 fL — ABNORMAL HIGH (ref 80.0–100.0)
Monocytes Absolute: 0.3 10*3/uL (ref 0.1–1.0)
Monocytes Relative: 9 %
Neutro Abs: 2.4 10*3/uL (ref 1.7–7.7)
Neutrophils Relative %: 66 %
Platelet Count: 189 10*3/uL (ref 150–400)
RBC: 3.73 MIL/uL — ABNORMAL LOW (ref 3.87–5.11)
RDW: 13.3 % (ref 11.5–15.5)
WBC Count: 3.7 10*3/uL — ABNORMAL LOW (ref 4.0–10.5)
nRBC: 0 % (ref 0.0–0.2)

## 2023-08-11 LAB — PHOSPHORUS: Phosphorus: 3.2 mg/dL (ref 2.5–4.6)

## 2023-08-12 ENCOUNTER — Other Ambulatory Visit (INDEPENDENT_AMBULATORY_CARE_PROVIDER_SITE_OTHER): Payer: Self-pay | Admitting: Nurse Practitioner

## 2023-08-12 DIAGNOSIS — I82432 Acute embolism and thrombosis of left popliteal vein: Secondary | ICD-10-CM

## 2023-08-12 LAB — CANCER ANTIGEN 19-9: CA 19-9: 130 U/mL — ABNORMAL HIGH (ref 0–35)

## 2023-08-13 ENCOUNTER — Ambulatory Visit: Admitting: Nurse Practitioner

## 2023-08-13 ENCOUNTER — Ambulatory Visit: Admitting: Internal Medicine

## 2023-08-13 ENCOUNTER — Other Ambulatory Visit

## 2023-08-16 ENCOUNTER — Ambulatory Visit (INDEPENDENT_AMBULATORY_CARE_PROVIDER_SITE_OTHER): Payer: Self-pay | Admitting: Nurse Practitioner

## 2023-08-16 ENCOUNTER — Encounter: Payer: Self-pay | Admitting: Internal Medicine

## 2023-08-16 ENCOUNTER — Encounter (INDEPENDENT_AMBULATORY_CARE_PROVIDER_SITE_OTHER): Payer: Self-pay | Admitting: Nurse Practitioner

## 2023-08-16 ENCOUNTER — Ambulatory Visit (INDEPENDENT_AMBULATORY_CARE_PROVIDER_SITE_OTHER): Payer: Self-pay

## 2023-08-16 VITALS — BP 122/85 | HR 99 | Ht 69.0 in | Wt 225.8 lb

## 2023-08-16 DIAGNOSIS — I82532 Chronic embolism and thrombosis of left popliteal vein: Secondary | ICD-10-CM | POA: Diagnosis not present

## 2023-08-16 DIAGNOSIS — I82432 Acute embolism and thrombosis of left popliteal vein: Secondary | ICD-10-CM | POA: Diagnosis not present

## 2023-08-16 MED ORDER — APIXABAN 5 MG PO TABS: 5.0000 mg | ORAL_TABLET | Freq: Two times a day (BID) | ORAL | 5 refills | Status: DC

## 2023-08-16 NOTE — Progress Notes (Signed)
 Subjective:    Patient ID: Vicki Phillips, female    DOB: [birth year], 58 y.o.   MRN: [id number] Chief Complaint  Patient presents with   Follow-up    Vicki Phillips is a 58 year old female who presents today for evaluation of left lower extremity DVT.  The DVT was initially diagnosed on 07/12/2023.  The patient is currently undergoing treatment for cholangiocarcinoma.  The patient noted symptoms following a trip to [place] which is about 8 and half hours flight.  She also did extensive walking during this trip as well.  During a visit to the emergency room it was noted that she had a popliteal and peroneal DVT in the left lower extremity.  She has been on Eliquis  for the last month but ran out about 3 days ago.  She denies any postphlebitic symptoms.  She denies any significant pain or discomfort to the left lower extremity.  She also has a family history of blood clots as her mother had 1 as well.  Today, noninvasive studies show resolution of the left lower extremity DVT.    Review of Systems  Cardiovascular:  Negative for leg swelling.  Hematological:  Bruises/bleeds easily.  All other systems reviewed and are negative.      Objective:   Physical Exam Vitals reviewed.  HENT:     Head: Normocephalic.   Cardiovascular:     Rate and Rhythm: Normal rate.  Pulmonary:     Effort: Pulmonary effort is normal.   Musculoskeletal:     Right lower leg: No edema.     Left lower leg: No edema.   Skin:    General: Skin is warm and dry.   Neurological:     Mental Status: She is alert and oriented to person, place, and time.   Psychiatric:        Mood and Affect: Mood normal.        Behavior: Behavior normal.        Thought Content: Thought content normal.        Judgment: Judgment normal.     BP 122/85   Pulse 99   Ht 5' 9 (1.753 m)   Wt 225 lb 12.8 oz (102.4 kg)   LMP 08/21/2014 (Approximate)   BMI 33.34 kg/m   Past Medical History:  Diagnosis Date   Allergy     Bile duct cancer (HCC)    Sarcoidosis 2011    Social History   Socioeconomic History   Marital status: Single    Spouse name: Not on file   Number of children: 2   Years of education: Not on file   Highest education level: Not on file  Occupational History   Not on file  Tobacco Use   Smoking status: Never   Smokeless tobacco: Never  Vaping Use   Vaping status: Never Used  Substance and Sexual Activity   Alcohol use: Not Currently    Alcohol/week: 4.0 standard drinks of alcohol    Types: 4 Glasses of wine per week   Drug use: No   Sexual activity: Yes    Partners: Male    Birth control/protection: Surgical  Other Topics Concern   Not on file  Social History Narrative   Not on file   Social Drivers of Health   Financial Resource Strain: Low Risk  (09/18/2022)   Overall Financial Resource Strain (CARDIA)    Difficulty of Paying Living Expenses: Not hard at all  Food Insecurity: No Food Insecurity (08/24/2022)   Hunger Vital  Sign    Worried About Programme researcher, broadcasting/film/video in the Last Year: Never true    Ran Out of Food in the Last Year: Never true  Transportation Needs: No Transportation Needs (08/24/2022)   PRAPARE - Administrator, Civil Service (Medical): No    Lack of Transportation (Non-Medical): No  Physical Activity: Not on file  Stress: Not on file  Social Connections: Not on file  Intimate Partner Violence: Not At Risk (08/24/2022)   Humiliation, Afraid, Rape, and Kick questionnaire    Fear of Current or Ex-Partner: No    Emotionally Abused: No    Physically Abused: No    Sexually Abused: No    Past Surgical History:  Procedure Laterality Date   CESAREAN SECTION     x 2   COLONOSCOPY WITH PROPOFOL  N/A 08/10/2019   Procedure: COLONOSCOPY WITH PROPOFOL ;  Surgeon: Jinny Carmine, MD;  Location: [facility];  Service: Endoscopy;  Laterality: N/A;  priority 4   IR IMAGING GUIDED PORT INSERTION  09/11/2022   LUNG BIOPSY      Family History   Problem Relation Age of Onset   Stroke Mother    Heart disease Mother    Heart attack Father 29   Heart disease Father    Kidney disease Sister     Allergies  Allergen Reactions   Other Cough    Hay fever       Latest Ref Rng & Units 08/11/2023    2:04 PM 07/27/2023    2:46 PM 07/12/2023    2:29 PM  CBC  WBC 4.0 - 10.5 K/uL 3.7  3.3  3.5   Hemoglobin 12.0 - 15.0 g/dL 86.6  87.7  87.1   Hematocrit 36.0 - 46.0 % 39.4  36.9  38.1   Platelets 150 - 400 K/uL 189  206  178       CMP     Component Value Date/Time   NA 139 08/11/2023 1404   NA 136 07/31/2019 1021   NA 141 05/27/2013 0034   K 4.0 08/11/2023 1404   K 3.3 (L) 05/27/2013 0034   CL 107 08/11/2023 1404   CL 109 (H) 05/27/2013 0034   CO2 22 08/11/2023 1404   CO2 23 05/27/2013 0034   GLUCOSE 117 (H) 08/11/2023 1404   GLUCOSE 98 05/27/2013 0034   BUN 21 (H) 08/11/2023 1404   BUN 19 07/31/2019 1021   BUN 19 (H) 05/27/2013 0034   CREATININE 0.98 08/11/2023 1404   CREATININE 1.22 05/27/2013 0034   CALCIUM  9.0 08/11/2023 1404   CALCIUM  8.2 (L) 05/27/2013 0034   PROT 7.4 08/11/2023 1404   PROT 7.1 07/31/2019 1021   PROT 7.3 05/27/2013 0034   ALBUMIN 4.0 08/11/2023 1404   ALBUMIN 4.8 07/31/2019 1021   ALBUMIN 3.6 05/27/2013 0034   AST 37 08/11/2023 1404   ALT 21 08/11/2023 1404   ALT 21 05/27/2013 0034   ALKPHOS 116 08/11/2023 1404   ALKPHOS 53 05/27/2013 0034   BILITOT 1.2 08/11/2023 1404   GFRNONAA >60 08/11/2023 1404   GFRNONAA 52 (L) 05/27/2013 0034     No results found.     Assessment & Plan:   1. Chronic deep vein thrombosis (DVT) of popliteal vein of left lower extremity (HCC) (Primary) Today noninvasive studies show resolution of the DVT in her left lower extremity.  While the patient's DVT was felt to be provoked, she is currently on active treatment for cholangiocarcinoma.  Based on this we feel  it would be beneficial for her to remain on full anticoagulation for 6 months.  Following this, it  is recommended that she move to a maintenance dose of 2.5 mg while she is undergoing cancer treatment.  Will plan on having the patient return in 5 months for reevaluation.  We also discussed possible postphlebitic symptoms which the patient is not exhibiting at this time.  She is advised to utilize medical grade compression, elevation and activity if she begins to swell experience some lower extremity edema.   Current Outpatient Medications on File Prior to Visit  Medication Sig Dispense Refill   amLODipine  (NORVASC ) 5 MG tablet Take 1 tablet (5 mg total) by mouth daily. 90 tablet 1   loratadine (CLARITIN) 10 MG tablet Take 10 mg by mouth daily.     OLANZapine  (ZYPREXA ) 10 MG tablet Take 1 tablet (10 mg total) by mouth at bedtime. 30 tablet 0   ondansetron  (ZOFRAN ) 8 MG tablet Take 1 tablet (8 mg total) by mouth every 8 (eight) hours as needed for nausea or vomiting. 90 tablet 0   calcium  acetate (PHOSLO ) 667 MG tablet Take 2 tablets (1,334 mg total) by mouth 3 (three) times daily with meals. (Patient not taking: Reported on 08/16/2023) 180 tablet 2   futibatinib , 12 mg daily dose, (LYTGOBI , 12 MG DAILY DOSE,) 4 MG tablet Take 3 tablets (12 mg total) by mouth daily. (Patient not taking: Reported on 08/16/2023) 84 tablet 0   [DISCONTINUED] fluticasone  (FLONASE ) 50 MCG/ACT nasal spray Place 2 sprays into both nostrils daily. 16 g 0   No current facility-administered medications on file prior to visit.    There are no Patient Instructions on file for this visit. No follow-ups on file.   Retal Tonkinson E Rodolphe Edmonston, NP

## 2023-08-17 ENCOUNTER — Other Ambulatory Visit: Payer: Self-pay | Admitting: *Deleted

## 2023-08-18 ENCOUNTER — Inpatient Hospital Stay

## 2023-08-19 ENCOUNTER — Encounter: Payer: Self-pay | Admitting: Internal Medicine

## 2023-08-24 ENCOUNTER — Other Ambulatory Visit: Payer: Self-pay | Admitting: Internal Medicine

## 2023-08-24 ENCOUNTER — Other Ambulatory Visit (HOSPITAL_COMMUNITY): Payer: Self-pay

## 2023-08-24 DIAGNOSIS — C221 Intrahepatic bile duct carcinoma: Secondary | ICD-10-CM

## 2023-08-25 ENCOUNTER — Other Ambulatory Visit: Payer: Self-pay

## 2023-08-25 ENCOUNTER — Encounter: Payer: Self-pay | Admitting: Internal Medicine

## 2023-08-25 ENCOUNTER — Inpatient Hospital Stay

## 2023-08-25 MED ORDER — FUTIBATINIB (12 MG DAILY DOSE) 4 MG PO TBPK
12.0000 mg | ORAL_TABLET | Freq: Every day | ORAL | 0 refills | Status: DC
  Filled 2023-08-25 – 2023-08-26 (×2): qty 84, 28d supply, fill #0

## 2023-08-26 ENCOUNTER — Other Ambulatory Visit (HOSPITAL_COMMUNITY): Payer: Self-pay

## 2023-08-31 ENCOUNTER — Other Ambulatory Visit: Payer: Self-pay | Admitting: *Deleted

## 2023-08-31 DIAGNOSIS — C221 Intrahepatic bile duct carcinoma: Secondary | ICD-10-CM

## 2023-09-01 ENCOUNTER — Inpatient Hospital Stay

## 2023-09-01 ENCOUNTER — Encounter: Payer: Self-pay | Admitting: Internal Medicine

## 2023-09-01 ENCOUNTER — Inpatient Hospital Stay: Attending: Internal Medicine

## 2023-09-01 ENCOUNTER — Inpatient Hospital Stay (HOSPITAL_BASED_OUTPATIENT_CLINIC_OR_DEPARTMENT_OTHER): Admitting: Internal Medicine

## 2023-09-01 VITALS — BP 115/83 | HR 105 | Temp 96.6°F | Resp 16 | Ht 69.0 in | Wt 226.5 lb

## 2023-09-01 DIAGNOSIS — Z5111 Encounter for antineoplastic chemotherapy: Secondary | ICD-10-CM | POA: Diagnosis not present

## 2023-09-01 DIAGNOSIS — Z95828 Presence of other vascular implants and grafts: Secondary | ICD-10-CM

## 2023-09-01 DIAGNOSIS — C221 Intrahepatic bile duct carcinoma: Secondary | ICD-10-CM

## 2023-09-01 DIAGNOSIS — C7801 Secondary malignant neoplasm of right lung: Secondary | ICD-10-CM | POA: Diagnosis not present

## 2023-09-01 DIAGNOSIS — Z7689 Persons encountering health services in other specified circumstances: Secondary | ICD-10-CM | POA: Diagnosis not present

## 2023-09-01 DIAGNOSIS — Z79899 Other long term (current) drug therapy: Secondary | ICD-10-CM | POA: Diagnosis not present

## 2023-09-01 DIAGNOSIS — D869 Sarcoidosis, unspecified: Secondary | ICD-10-CM | POA: Insufficient documentation

## 2023-09-01 DIAGNOSIS — C7802 Secondary malignant neoplasm of left lung: Secondary | ICD-10-CM | POA: Diagnosis not present

## 2023-09-01 DIAGNOSIS — I10 Essential (primary) hypertension: Secondary | ICD-10-CM | POA: Insufficient documentation

## 2023-09-01 DIAGNOSIS — Z7901 Long term (current) use of anticoagulants: Secondary | ICD-10-CM | POA: Diagnosis not present

## 2023-09-01 DIAGNOSIS — I825Z2 Chronic embolism and thrombosis of unspecified deep veins of left distal lower extremity: Secondary | ICD-10-CM | POA: Diagnosis not present

## 2023-09-01 LAB — CMP (CANCER CENTER ONLY)
ALT: 22 U/L (ref 0–44)
AST: 49 U/L — ABNORMAL HIGH (ref 15–41)
Albumin: 3.2 g/dL — ABNORMAL LOW (ref 3.5–5.0)
Alkaline Phosphatase: 154 U/L — ABNORMAL HIGH (ref 38–126)
Anion gap: 8 (ref 5–15)
BUN: 12 mg/dL (ref 6–20)
CO2: 23 mmol/L (ref 22–32)
Calcium: 8.7 mg/dL — ABNORMAL LOW (ref 8.9–10.3)
Chloride: 104 mmol/L (ref 98–111)
Creatinine: 0.75 mg/dL (ref 0.44–1.00)
GFR, Estimated: >60 mL/min (ref >60)
Glucose, Bld: 122 mg/dL — ABNORMAL HIGH (ref 70–99)
Potassium: 3.3 mmol/L — ABNORMAL LOW (ref 3.5–5.1)
Sodium: 135 mmol/L (ref 135–145)
Total Bilirubin: 0.8 mg/dL (ref 0.0–1.2)
Total Protein: 6.9 g/dL (ref 6.5–8.1)

## 2023-09-01 LAB — CBC WITH DIFFERENTIAL (CANCER CENTER ONLY)
Abs Immature Granulocytes: 0.01 K/uL (ref 0.00–0.07)
Basophils Absolute: 0 K/uL (ref 0.0–0.1)
Basophils Relative: 0 %
Eosinophils Absolute: 0 K/uL (ref 0.0–0.5)
Eosinophils Relative: 0 %
HCT: 34.9 % — ABNORMAL LOW (ref 36.0–46.0)
Hemoglobin: 11.8 g/dL — ABNORMAL LOW (ref 12.0–15.0)
Immature Granulocytes: 0 %
Lymphocytes Relative: 15 %
Lymphs Abs: 0.6 K/uL — ABNORMAL LOW (ref 0.7–4.0)
MCH: 35.4 pg — ABNORMAL HIGH (ref 26.0–34.0)
MCHC: 33.8 g/dL (ref 30.0–36.0)
MCV: 104.8 fL — ABNORMAL HIGH (ref 80.0–100.0)
Monocytes Absolute: 0.4 K/uL (ref 0.1–1.0)
Monocytes Relative: 10 %
Neutro Abs: 2.7 K/uL (ref 1.7–7.7)
Neutrophils Relative %: 75 %
Platelet Count: 215 K/uL (ref 150–400)
RBC: 3.33 MIL/uL — ABNORMAL LOW (ref 3.87–5.11)
RDW: 14.9 % (ref 11.5–15.5)
WBC Count: 3.7 K/uL — ABNORMAL LOW (ref 4.0–10.5)
nRBC: 0 % (ref 0.0–0.2)

## 2023-09-01 LAB — PHOSPHORUS: Phosphorus: 2.7 mg/dL (ref 2.5–4.6)

## 2023-09-01 MED ORDER — HEPARIN SOD (PORK) LOCK FLUSH 100 UNIT/ML IV SOLN
500.0000 [IU] | Freq: Once | INTRAVENOUS | Status: AC
  Administered 2023-09-01: 500 [IU] via INTRAVENOUS
  Filled 2023-09-01: qty 5

## 2023-09-01 MED ORDER — SODIUM CHLORIDE 0.9% FLUSH
10.0000 mL | Freq: Once | INTRAVENOUS | Status: AC
  Administered 2023-09-01: 10 mL via INTRAVENOUS
  Filled 2023-09-01: qty 10

## 2023-09-01 NOTE — Assessment & Plan Note (Addendum)
#   Intrahepatic Cholangiocarcinoma, stage IV- - s/p liver biopsy on 08/26/2022 showed metastatic moderately differentiated adenocarcinoma.  IHC stain positive for CK7 and CK20.  TTF-1, PAX8, GATA3 and CDX2 are negative.  Differentials include cholangiocarcinoma and pancreatic carcinoma. -PD-L1 0%.  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 stage IV intrahepatic cholangiocarcinoma- Cisplatin  25 mg/m/gemcitabine  1000 mg/m/Durvalumab  -completed 8 cycles on 03/04/2023; Maintenance Durvalumab /gemcitabine -started 03/26/2023- l March 2025- ith rising CA 19-9, CT chest abdomen pelvis was done which unfortunately showed progression of the lung nodules. Full dose 20 mg on 06/04/2023.  JUNE 2025- Futibatinib  12 mg- ON HOLD due to ocular findings as well as aches, pains, and fatigue.   # Clinically patient noted to have progression of disease-given the worsening cough; likely elevated liver numbers; and also tumor markers.  Will get a CT scan chest and pelvis ASAP to confirm the clinical concerns.  If progression noted recommend gemcitabine  Abraxane chemotherapy [steroid sparing].   # MAY 19th, 2025-  DVT of the left lower extremity-  Eliquis  5 mg twice daily for 6 months; and then recommend 2.5 mg twice daily indefinitely given her underlying cholangiocarcinoma.   # Hyperphosphatemia: -Secondary to futibatinib  this should improve after stopping therapy.   # Nausea/poor appetite- : recommend zofran /compazine  [anxiety]; recommend commplaince with zyprexa - 10 mg at bedtime-    # Diarrhea: -Chronic.  Imodium as needed.  Would like to hold off on Lomotil.   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 -Improving since off steroid.  Will monitor for now.  # Hypertension: -Continue with amlodipine  5 mg daily.    # Cough: -Continue with Tussionex as needed.  Await CT scan- as above.    # Poor appetite: -Patient is open to start appetite stimulant.  Sent prescription for  olanzapine  10 mg once daily.   # History of sarcoidosis- -Management per pulmonary; -Never required treatment.  # IV access; port flush q 2 M  # DISPOSITION:  # CT CAP-ASAP # follow up  TBD-Dr.B ..

## 2023-09-01 NOTE — Progress Notes (Signed)
 DISCONTINUE OFF PATHWAY REGIMEN - Other   [id number]:Futibatinib  20 mg PO Daily D1-28 q28 Days:   A cycle is every 28 days:     Futibatinib    **Always confirm dose/schedule in your pharmacy ordering system**  PRIOR TREATMENT: Futibatinib  20 mg PO Daily D1-28 q28 Days  START OFF PATHWAY REGIMEN - Other   [id number]:Gemcitabine  1,000 mg/m2 IV D1,8,15 + Nab-Paclitaxel 125 mg/m2 IV D1,8,15 q28 Days:   A cycle is every 28 days:     Nab-paclitaxel (protein bound)      Gemcitabine    **Always confirm dose/schedule in your pharmacy ordering system**  Patient Characteristics: Intent of Therapy: Non-Curative / Palliative Intent, Discussed with Patient

## 2023-09-01 NOTE — Progress Notes (Signed)
 Last week had fever 101-102 everyday, felt like covid, exhausted, sinus, cough.  07/28/23 u/s rt lower extremity.   Appetite 75% normal, nausea at times.  Finger and toe nails are coming out.

## 2023-09-01 NOTE — Progress Notes (Signed)
 [facility] CONSULT NOTE  Patient Care Team: Justus Leita DEL, MD as PCP - General (Internal Medicine) Verdene Gills, RN as Oncology Nurse Navigator Rennie Cindy SAUNDERS, MD as Consulting Physician (Oncology)  CHIEF COMPLAINTS/PURPOSE OF CONSULTATION: cholangiocarcinoma  Oncology History Overview Note   Cisplatin  25 mg/m/gemcitabine  1000 mg/m/Durvalumab  -completed 8 cycles on 03/04/2023 Maintenance Durvalumab /gemcitabine -started 03/26/2023   ASSESSMENT & PLAN:  Vicki Phillips 58 y.o. 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   # Intrahepatic Cholangiocarcinoma, stage IV - s/p liver biopsy on 08/26/2022 showed metastatic moderately differentiated adenocarcinoma.  IHC stain positive for CK7 and CK20.  TTF-1, PAX8, GATA3 and CDX2 are negative.  Differentials include cholangiocarcinoma and pancreatic carcinoma.    -PD-L1 0%.  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   -s/p cisplatin , gemcitabine  and Durvalumab  every 3 weeks for 8 cycles.  Then on maintenance Durvalumab  and gemcitabine  until March 2025. -With rising CA 19-9, CT chest abdomen pelvis was done which unfortunately showed progression of the lung nodules.   - Based on FGFR 2 gene rearrangement, plan was to start futibatinib  16 mg.  Patient decided to start at full dose 20 mg on 06/04/2023.  Phosphorus is elevated to 7.5.  Dosing guidelines reviewed.  Will decrease dose to 16 mg once daily.  Weekly phosphorus level check.  Started on PhosLo  2 tabs with every meals.  If phosphorus level goes below 7 in the next 2 weeks, we will continue with futibatinib  16 mg once daily.  # JUNE 2025- HOLD futibatinib  sec to myalgias/arthralgias/ocular symptoms  #   # MAY 19th, 2025- LEFT DVT- on eliquis  [name] ]     Intrahepatic cholangiocarcinoma (HCC)  08/26/2022 Cancer Staging   Staging form: Intrahepatic Bile Duct, AJCC  8th Edition - Clinical stage from 08/26/2022: Stage IV (cM1) - Signed by Clista Bimler, MD on 09/03/2022 Stage prefix: Initial diagnosis   09/03/2022 Initial Diagnosis   Cholangiocarcinoma (HCC)   09/18/2022 - 04/30/2023 Chemotherapy   Patient is on Treatment Plan : BILIARY TRACT Cisplatin  + Gemcitabine  D1,8 + Durvalumab  (1500) D1 q21d / Durvalumab  (1500) q28d     09/02/2023 -  Chemotherapy   Patient is on Treatment Plan : PANCREATIC Abraxane D1,8,15 + Gemcitabine  D1,8,15 q28d     Metastasis to lung (HCC)  09/03/2022 Initial Diagnosis   Metastasis to lung (HCC)   09/18/2022 - 04/30/2023 Chemotherapy   Patient is on Treatment Plan : BILIARY TRACT Cisplatin  + Gemcitabine  D1,8 + Durvalumab  (1500) D1 q21d / Durvalumab  (1500) q28d     09/02/2023 -  Chemotherapy   Patient is on Treatment Plan : PANCREATIC Abraxane D1,8,15 + Gemcitabine  D1,8,15 q28d      HISTORY OF PRESENTING ILLNESS: Patient ambulating-independently. Accompanied by family- son.    Vicki Phillips 58 y.o.  female pleasant patient with a history of intrahepatic stage IV cholangiocarcinoma with metastasis to lungs is currently OFF- FGFR inhibitor- MAY 2025-  DVT of the left lower extremity-  Eliquis  is here for a follow up.  Currently OFF FGFR inhibitor- sec to joint pains/eye symptoms.   Currently on eliquis .   Noted to have worsening cough.  No  dyspnea. No palpitations.   Review of Systems  Constitutional:  Positive for malaise/fatigue. Negative for chills, diaphoresis, fever and weight loss.  HENT:  Negative for nosebleeds and sore throat.   Eyes:  Negative for double vision.  Respiratory:  Negative for cough, hemoptysis, sputum production, shortness of breath  and wheezing.   Cardiovascular:  Positive for leg swelling. Negative for chest pain, palpitations and orthopnea.  Gastrointestinal:  Negative for abdominal pain, blood in stool, constipation, diarrhea, heartburn, melena, nausea and vomiting.  Genitourinary:   Negative for dysuria, frequency and urgency.  Musculoskeletal:  Positive for back pain and joint pain.  Skin: Negative.  Negative for itching and rash.  Neurological:  Negative for dizziness, tingling, focal weakness, weakness and headaches.  Endo/Heme/Allergies:  Does not bruise/bleed easily.  Psychiatric/Behavioral:  Negative for depression. The patient is not nervous/anxious and does not have insomnia.     MEDICAL HISTORY:  Past Medical History:  Diagnosis Date   Allergy    Bile duct cancer (HCC)    Sarcoidosis 2011    SURGICAL HISTORY: Past Surgical History:  Procedure Laterality Date   CESAREAN SECTION     x 2   COLONOSCOPY WITH PROPOFOL  N/A 08/10/2019   Procedure: COLONOSCOPY WITH PROPOFOL ;  Surgeon: Jinny Carmine, MD;  Location: [facility];  Service: Endoscopy;  Laterality: N/A;  priority 4   IR IMAGING GUIDED PORT INSERTION  09/11/2022   LUNG BIOPSY      SOCIAL HISTORY: Social History   Socioeconomic History   Marital status: Single    Spouse name: Not on file   Number of children: 2   Years of education: Not on file   Highest education level: Not on file  Occupational History   Not on file  Tobacco Use   Smoking status: Never   Smokeless tobacco: Never  Vaping Use   Vaping status: Never Used  Substance and Sexual Activity   Alcohol use: Not Currently    Alcohol/week: 4.0 standard drinks of alcohol    Types: 4 Glasses of wine per week   Drug use: No   Sexual activity: Yes    Partners: Male    Birth control/protection: Surgical  Other Topics Concern   Not on file  Social History Narrative   Not on file   Social Drivers of Health   Financial Resource Strain: Low Risk  (09/18/2022)   Overall Financial Resource Strain (CARDIA)    Difficulty of Paying Living Expenses: Not hard at all  Food Insecurity: No Food Insecurity (08/24/2022)   Hunger Vital Sign    Worried About Running Out of Food in the Last Year: Never true    Ran Out of Food in the Last  Year: Never true  Transportation Needs: No Transportation Needs (08/24/2022)   PRAPARE - Administrator, Civil Service (Medical): No    Lack of Transportation (Non-Medical): No  Physical Activity: Not on file  Stress: Not on file  Social Connections: Not on file  Intimate Partner Violence: Not At Risk (08/24/2022)   Humiliation, Afraid, Rape, and Kick questionnaire    Fear of Current or Ex-Partner: No    Emotionally Abused: No    Physically Abused: No    Sexually Abused: No    FAMILY HISTORY: Family History  Problem Relation Age of Onset   Stroke Mother    Heart disease Mother    Heart attack Father 33   Heart disease Father    Kidney disease Sister     ALLERGIES:  is allergic to other.  MEDICATIONS:  Current Outpatient Medications  Medication Sig Dispense Refill   apixaban  (ELIQUIS ) 5 MG TABS tablet Take 1 tablet (5 mg total) by mouth 2 (two) times daily. 60 tablet 5   loratadine (CLARITIN) 10 MG tablet Take 10 mg by mouth  daily.     OLANZapine  (ZYPREXA ) 10 MG tablet Take 1 tablet (10 mg total) by mouth at bedtime. 30 tablet 0   ondansetron  (ZOFRAN ) 8 MG tablet Take 1 tablet (8 mg total) by mouth every 8 (eight) hours as needed for nausea or vomiting. 90 tablet 0   amLODipine  (NORVASC ) 5 MG tablet Take 1 tablet (5 mg total) by mouth daily. (Patient not taking: Reported on 09/01/2023) 90 tablet 1   futibatinib , 12 mg daily dose, (LYTGOBI ) 4 MG tablet Take 3 tablets (12 mg total) by mouth daily. (Patient not taking: Reported on 09/01/2023) 84 tablet 0   No current facility-administered medications for this visit.    PHYSICAL EXAMINATION:   Vitals:   09/01/23 1331 09/01/23 1416  BP: (!) 123/97 115/83  Pulse: (!) 105   Resp: 16   Temp: (!) 96.6 F (35.9 C)   SpO2: 98%     Filed Weights   09/01/23 1331  Weight: 226 lb 8 oz (102.7 kg)    LEFT LOWER extremity- Swelling and tenderness.   Physical Exam Vitals and nursing note reviewed.  HENT:     Head:  Normocephalic and atraumatic.     Mouth/Throat:     Pharynx: Oropharynx is clear.  Eyes:     Extraocular Movements: Extraocular movements intact.     Pupils: Pupils are equal, round, and reactive to light.  Cardiovascular:     Rate and Rhythm: Normal rate and regular rhythm.  Pulmonary:     Comments: Decreased breath sounds bilaterally.  Abdominal:     Palpations: Abdomen is soft.  Musculoskeletal:        General: Normal range of motion.     Cervical back: Normal range of motion.  Skin:    General: Skin is warm.  Neurological:     General: No focal deficit present.     Mental Status: She is alert and oriented to person, place, and time.  Psychiatric:        Behavior: Behavior normal.        Judgment: Judgment normal.     LABORATORY DATA:  I have reviewed the data as listed Lab Results  Component Value Date   WBC 3.7 (L) 09/01/2023   HGB 11.8 (L) 09/01/2023   HCT 34.9 (L) 09/01/2023   MCV 104.8 (H) 09/01/2023   PLT 215 09/01/2023   Recent Labs    07/27/23 1446 08/11/23 1404 09/01/23 1341  NA 137 139 135  K 3.8 4.0 3.3*  CL 105 107 104  CO2 21* 22 23  GLUCOSE 102* 117* 122*  BUN 14 21* 12  CREATININE 0.81 0.98 0.75  CALCIUM  9.0 9.0 8.7*  GFRNONAA >60 >60 >60  PROT 7.2 7.4 6.9  ALBUMIN 3.7 4.0 3.2*  AST 30 37 49*  ALT 19 21 22   ALKPHOS 116 116 154*  BILITOT 0.9 1.2 0.8    RADIOGRAPHIC STUDIES: I have personally reviewed the radiological images as listed and agreed with the findings in the report. VAS US  LOWER EXTREMITY VENOUS (DVT) Result Date: 08/18/2023  Lower Venous DVT Study Patient Name:  [facility]  Date of Exam:   08/16/2023 Medical Rec #: [id number]            Accession #:    [id number] Date of Birth: 08/29/1965           Patient Gender: F Patient Age:   64 years Exam Location:  [facility] Procedure:      VAS US  LOWER EXTREMITY  VENOUS (DVT) Referring Phys: ORVIN DARING  --------------------------------------------------------------------------------  Indications: Hx dvt x 1 month.  Risk Factors: DVT left. Comparison Study: 06/2023 Performing Technologist: Jerel Croak RVT  Examination Guidelines: A complete evaluation includes B-mode imaging, spectral Doppler, color Doppler, and power Doppler as needed of all accessible portions of each vessel. Bilateral testing is considered an integral part of a complete examination. Limited examinations for reoccurring indications may be performed as noted. The reflux portion of the exam is performed with the patient in reverse Trendelenburg.  +-----+---------------+---------+-----------+----------+--------------+ RIGHTCompressibilityPhasicitySpontaneityPropertiesThrombus Aging +-----+---------------+---------+-----------+----------+--------------+ CFV  Full           Yes      Yes                                 +-----+---------------+---------+-----------+----------+--------------+ SFJ  Full           Yes      Yes                                 +-----+---------------+---------+-----------+----------+--------------+   +---------+---------------+---------+-----------+----------+--------------+ LEFT     CompressibilityPhasicitySpontaneityPropertiesThrombus Aging +---------+---------------+---------+-----------+----------+--------------+ CFV      Full           Yes      Yes                                 +---------+---------------+---------+-----------+----------+--------------+ SFJ      Full           Yes      Yes                                 +---------+---------------+---------+-----------+----------+--------------+ FV Prox  Full                                                        +---------+---------------+---------+-----------+----------+--------------+ FV Mid   Full           Yes      Yes                                  +---------+---------------+---------+-----------+----------+--------------+ FV DistalFull                                                        +---------+---------------+---------+-----------+----------+--------------+ PFV      Full           Yes      Yes                                 +---------+---------------+---------+-----------+----------+--------------+ POP      Full           Yes      Yes                                 +---------+---------------+---------+-----------+----------+--------------+  PTV      Full           Yes      Yes                                 +---------+---------------+---------+-----------+----------+--------------+ PERO     Full           Yes      Yes                                 +---------+---------------+---------+-----------+----------+--------------+ Gastroc  Full           Yes      Yes                                 +---------+---------------+---------+-----------+----------+--------------+ GSV      Full           Yes      Yes                                 +---------+---------------+---------+-----------+----------+--------------+ SSV      Full           Yes      Yes                                 +---------+---------------+---------+-----------+----------+--------------+     Summary: RIGHT: - No evidence of common femoral vein obstruction.   LEFT: - No evidence of deep vein thrombosis in the lower extremity. No indirect evidence of obstruction proximal to the inguinal ligament.  - Findings appear improved from previous examination. Findings suggest resolution of previously noted thrombus.  *See table(s) above for measurements and observations. Electronically signed by Cordella Shawl MD on 08/18/2023 at 8:08:40 AM.    Final      Intrahepatic cholangiocarcinoma (HCC) # Intrahepatic Cholangiocarcinoma, stage IV- - s/p liver biopsy on 08/26/2022 showed metastatic moderately differentiated adenocarcinoma.  IHC stain  positive for CK7 and CK20.  TTF-1, PAX8, GATA3 and CDX2 are negative.  Differentials include cholangiocarcinoma and pancreatic carcinoma. -PD-L1 0%.  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 stage IV intrahepatic cholangiocarcinoma- Cisplatin  25 mg/m/gemcitabine  1000 mg/m/Durvalumab  -completed 8 cycles on 03/04/2023; Maintenance Durvalumab /gemcitabine -started 03/26/2023- l March 2025- ith rising CA 19-9, CT chest abdomen pelvis was done which unfortunately showed progression of the lung nodules. Full dose 20 mg on 06/04/2023.  JUNE 2025- Futibatinib  12 mg- ON HOLD due to ocular findings as well as aches, pains, and fatigue.   # Clinically patient noted to have progression of disease-given the worsening cough; likely elevated liver numbers; and also tumor markers.  Will get a CT scan chest and pelvis ASAP to confirm the clinical concerns.  If progression noted recommend gemcitabine  Abraxane chemotherapy [steroid sparing].   # MAY 19th, 2025-  DVT of the left lower extremity-  Eliquis  5 mg twice daily for 6 months; and then recommend 2.5 mg twice daily indefinitely given her underlying cholangiocarcinoma.   # Hyperphosphatemia: -Secondary to futibatinib  this should improve after stopping therapy.   # Nausea/poor appetite- : recommend zofran /compazine  [anxiety]; recommend commplaince with zyprexa - 10 mg at bedtime-    # Diarrhea: -Chronic.  Imodium as needed.  Would like to hold off on Lomotil.   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 -Improving since off steroid.  Will monitor for now.  # Hypertension: -Continue with amlodipine  5 mg daily.    # Cough: -Continue with Tussionex as needed.  Await CT scan- as above.    # Poor appetite: -Patient is open to start appetite stimulant.  Sent prescription for olanzapine  10 mg once daily.   # History of sarcoidosis- -Management per pulmonary; -Never required treatment.  # IV access; port flush q 2  M  # DISPOSITION:  # CT CAP-ASAP # follow up  TBD-Dr.B ..  Above plan of care was discussed with patient/family in detail.  My contact information was given to the patient/family.      Cindy JONELLE Joe, MD 09/01/2023 9:11 PM

## 2023-09-02 ENCOUNTER — Ambulatory Visit
Admission: RE | Admit: 2023-09-02 | Discharge: 2023-09-02 | Disposition: A | Discharge status: Home / Self Care | Source: Ambulatory Visit | Attending: Internal Medicine | Admitting: Internal Medicine

## 2023-09-02 ENCOUNTER — Other Ambulatory Visit: Payer: Self-pay

## 2023-09-02 DIAGNOSIS — C221 Intrahepatic bile duct carcinoma: Secondary | ICD-10-CM | POA: Diagnosis not present

## 2023-09-02 DIAGNOSIS — K5730 Diverticulosis of large intestine without perforation or abscess without bleeding: Secondary | ICD-10-CM | POA: Diagnosis not present

## 2023-09-02 DIAGNOSIS — I700 Atherosclerosis of aorta: Secondary | ICD-10-CM | POA: Diagnosis not present

## 2023-09-02 DIAGNOSIS — R160 Hepatomegaly, not elsewhere classified: Secondary | ICD-10-CM | POA: Diagnosis not present

## 2023-09-02 LAB — CANCER ANTIGEN 19-9: CA 19-9: 281 U/mL — ABNORMAL HIGH (ref 0–35)

## 2023-09-02 MED ORDER — IOHEXOL 300 MG/ML  SOLN
100.0000 mL | Freq: Once | INTRAMUSCULAR | Status: AC | PRN
  Administered 2023-09-02: 100 mL via INTRAVENOUS

## 2023-09-03 ENCOUNTER — Other Ambulatory Visit: Payer: Self-pay

## 2023-09-03 ENCOUNTER — Other Ambulatory Visit: Payer: Self-pay | Admitting: *Deleted

## 2023-09-03 ENCOUNTER — Other Ambulatory Visit: Payer: Self-pay | Admitting: Internal Medicine

## 2023-09-03 DIAGNOSIS — C221 Intrahepatic bile duct carcinoma: Secondary | ICD-10-CM

## 2023-09-03 DIAGNOSIS — C7801 Secondary malignant neoplasm of right lung: Secondary | ICD-10-CM

## 2023-09-03 MED ORDER — PROCHLORPERAZINE MALEATE 10 MG PO TABS: 10.0000 mg | ORAL_TABLET | Freq: Four times a day (QID) | ORAL | 2 refills | Status: AC | PRN

## 2023-09-03 NOTE — Progress Notes (Signed)
 Pharmacist Chemotherapy Monitoring - Initial Assessment    Anticipated start date: 09/17/23   The following has been reviewed per standard work regarding the patient's treatment regimen: The patient's diagnosis, treatment plan and drug doses, and organ/hematologic function Lab orders and baseline tests specific to treatment regimen  The treatment plan start date, drug sequencing, and pre-medications Prior authorization status  Patient's documented medication list, including drug-drug interaction screen and prescriptions for anti-emetics and supportive care specific to the treatment regimen The drug concentrations, fluid compatibility, administration routes, and timing of the medications to be used The patient's access for treatment and lifetime cumulative dose history, if applicable  The patient's medication allergies and previous infusion related reactions, if applicable   Intrahepatic Cholangiocarcinoma, stage IV  Previously on gem/cis/imfinzi  with progression Place on futibatnib 20mg  then dose decreased to 12 mg but dc due to side effects (ocular and fatigue Change to gemzar  / abraxane pallative      Redell JINNY Gaskins, RPH, 09/03/2023  2:42 PM

## 2023-09-03 NOTE — Progress Notes (Signed)
 I spoke to patient regarding the progression noted on the CT scan-recommend gemcitabine  Abraxane.   # Recommend starting chemotherapy-1 week-MD labs CBC CMP; CA 19-9; phos; and as per IS   GB

## 2023-09-06 ENCOUNTER — Other Ambulatory Visit: Payer: Self-pay | Admitting: Internal Medicine

## 2023-09-07 ENCOUNTER — Other Ambulatory Visit (HOSPITAL_COMMUNITY): Payer: Self-pay

## 2023-09-07 ENCOUNTER — Other Ambulatory Visit: Payer: Self-pay

## 2023-09-07 NOTE — Progress Notes (Signed)
 Therapy was discontinued due to disease progression, patient is transitioning to IV chemotherapy. Disenrolling from Specialty Pharmacy services.

## 2023-09-10 ENCOUNTER — Encounter: Payer: Self-pay | Admitting: Internal Medicine

## 2023-09-12 ENCOUNTER — Other Ambulatory Visit: Payer: Self-pay | Admitting: Internal Medicine

## 2023-09-13 ENCOUNTER — Encounter: Payer: Self-pay | Admitting: Internal Medicine

## 2023-09-17 ENCOUNTER — Inpatient Hospital Stay

## 2023-09-17 ENCOUNTER — Inpatient Hospital Stay (HOSPITAL_BASED_OUTPATIENT_CLINIC_OR_DEPARTMENT_OTHER): Admitting: Internal Medicine

## 2023-09-17 ENCOUNTER — Encounter: Payer: Self-pay | Admitting: Internal Medicine

## 2023-09-17 VITALS — BP 130/96 | HR 98

## 2023-09-17 VITALS — BP 128/95 | HR 99 | Temp 95.8°F | Resp 16 | Ht 69.0 in | Wt 224.3 lb

## 2023-09-17 DIAGNOSIS — Z5111 Encounter for antineoplastic chemotherapy: Secondary | ICD-10-CM

## 2023-09-17 DIAGNOSIS — C221 Intrahepatic bile duct carcinoma: Secondary | ICD-10-CM

## 2023-09-17 DIAGNOSIS — I825Z2 Chronic embolism and thrombosis of unspecified deep veins of left distal lower extremity: Secondary | ICD-10-CM | POA: Diagnosis not present

## 2023-09-17 DIAGNOSIS — C7802 Secondary malignant neoplasm of left lung: Secondary | ICD-10-CM | POA: Diagnosis not present

## 2023-09-17 DIAGNOSIS — Z7901 Long term (current) use of anticoagulants: Secondary | ICD-10-CM | POA: Diagnosis not present

## 2023-09-17 DIAGNOSIS — I10 Essential (primary) hypertension: Secondary | ICD-10-CM | POA: Diagnosis not present

## 2023-09-17 DIAGNOSIS — C7801 Secondary malignant neoplasm of right lung: Secondary | ICD-10-CM

## 2023-09-17 DIAGNOSIS — Z79899 Other long term (current) drug therapy: Secondary | ICD-10-CM | POA: Diagnosis not present

## 2023-09-17 DIAGNOSIS — D869 Sarcoidosis, unspecified: Secondary | ICD-10-CM | POA: Diagnosis not present

## 2023-09-17 DIAGNOSIS — Z7689 Persons encountering health services in other specified circumstances: Secondary | ICD-10-CM | POA: Diagnosis not present

## 2023-09-17 LAB — CBC WITH DIFFERENTIAL (CANCER CENTER ONLY)
Abs Immature Granulocytes: 0 K/uL (ref 0.00–0.07)
Basophils Absolute: 0 K/uL (ref 0.0–0.1)
Basophils Relative: 0 %
Eosinophils Absolute: 0 K/uL (ref 0.0–0.5)
Eosinophils Relative: 0 %
HCT: 38.3 % (ref 36.0–46.0)
Hemoglobin: 12.9 g/dL (ref 12.0–15.0)
Immature Granulocytes: 0 %
Lymphocytes Relative: 29 %
Lymphs Abs: 0.6 K/uL — ABNORMAL LOW (ref 0.7–4.0)
MCH: 35.9 pg — ABNORMAL HIGH (ref 26.0–34.0)
MCHC: 33.7 g/dL (ref 30.0–36.0)
MCV: 106.7 fL — ABNORMAL HIGH (ref 80.0–100.0)
Monocytes Absolute: 0.3 K/uL (ref 0.1–1.0)
Monocytes Relative: 14 %
Neutro Abs: 1.2 K/uL — ABNORMAL LOW (ref 1.7–7.7)
Neutrophils Relative %: 57 %
Platelet Count: 205 K/uL (ref 150–400)
RBC: 3.59 MIL/uL — ABNORMAL LOW (ref 3.87–5.11)
RDW: 15.1 % (ref 11.5–15.5)
WBC Count: 2.2 K/uL — ABNORMAL LOW (ref 4.0–10.5)
nRBC: 0 % (ref 0.0–0.2)

## 2023-09-17 LAB — CMP (CANCER CENTER ONLY)
ALT: 42 U/L (ref 0–44)
AST: 66 U/L — ABNORMAL HIGH (ref 15–41)
Albumin: 3.6 g/dL (ref 3.5–5.0)
Alkaline Phosphatase: 117 U/L (ref 38–126)
Anion gap: 9 (ref 5–15)
BUN: 9 mg/dL (ref 6–20)
CO2: 23 mmol/L (ref 22–32)
Calcium: 8.7 mg/dL — ABNORMAL LOW (ref 8.9–10.3)
Chloride: 106 mmol/L (ref 98–111)
Creatinine: 0.66 mg/dL (ref 0.44–1.00)
GFR, Estimated: >60 mL/min (ref >60)
Glucose, Bld: 96 mg/dL (ref 70–99)
Potassium: 3.6 mmol/L (ref 3.5–5.1)
Sodium: 138 mmol/L (ref 135–145)
Total Bilirubin: 0.7 mg/dL (ref 0.0–1.2)
Total Protein: 6.8 g/dL (ref 6.5–8.1)

## 2023-09-17 LAB — PHOSPHORUS: Phosphorus: 3.8 mg/dL (ref 2.5–4.6)

## 2023-09-17 MED ORDER — PROCHLORPERAZINE MALEATE 10 MG PO TABS
10.0000 mg | ORAL_TABLET | Freq: Once | ORAL | Status: AC
  Administered 2023-09-17: 10 mg via ORAL
  Filled 2023-09-17: qty 1

## 2023-09-17 MED ORDER — SODIUM CHLORIDE 0.9 % IV SOLN
1000.0000 mg/m2 | Freq: Once | INTRAVENOUS | Status: AC
  Administered 2023-09-17: 2242 mg via INTRAVENOUS
  Filled 2023-09-17: qty 58.97

## 2023-09-17 MED ORDER — DIPHENOXYLATE-ATROPINE 2.5-0.025 MG PO TABS: 1.0000 | ORAL_TABLET | Freq: Four times a day (QID) | ORAL | 0 refills | Status: AC | PRN

## 2023-09-17 MED ORDER — HEPARIN SOD (PORK) LOCK FLUSH 100 UNIT/ML IV SOLN
500.0000 [IU] | Freq: Once | INTRAVENOUS | Status: AC | PRN
Start: 2023-09-17 — End: 2023-09-17
  Administered 2023-09-17: 500 [IU]
  Filled 2023-09-17: qty 5

## 2023-09-17 MED ORDER — SODIUM CHLORIDE 0.9 % IV SOLN
INTRAVENOUS | Status: DC
Start: 2023-09-17 — End: 2023-09-17
  Filled 2023-09-17: qty 250

## 2023-09-17 MED ORDER — SODIUM CHLORIDE 0.9% FLUSH
10.0000 mL | INTRAVENOUS | Status: DC | PRN
  Administered 2023-09-17: 10 mL
  Filled 2023-09-17: qty 10

## 2023-09-17 MED ORDER — PACLITAXEL PROTEIN-BOUND CHEMO INJECTION 100 MG
125.0000 mg/m2 | Freq: Once | INTRAVENOUS | Status: AC
  Administered 2023-09-17: 300 mg via INTRAVENOUS
  Filled 2023-09-17: qty 60

## 2023-09-17 NOTE — Patient Instructions (Signed)
 [facility] - A DEPT OF [facility]  Discharge Instructions: Thank you for choosing [facility] to provide your oncology and hematology care.  If you have a lab appointment with the Cancer Center, please go directly to the Cancer Center and check in at the registration area.  Wear comfortable clothing and clothing appropriate for easy access to any Portacath or PICC line.   We strive to give you quality time with your provider. You may need to reschedule your appointment if you arrive late (15 or more minutes).  Arriving late affects you and other patients whose appointments are after yours.  Also, if you miss three or more appointments without notifying the office, you may be dismissed from the clinic at the provider's discretion.      For prescription refill requests, have your pharmacy contact our office and allow 72 hours for refills to be completed.    Today you received the following chemotherapy and/or immunotherapy agents abraxane/gemzar     To help prevent nausea and vomiting after your treatment, we encourage you to take your nausea medication as directed.  BELOW ARE SYMPTOMS THAT SHOULD BE REPORTED IMMEDIATELY: *FEVER GREATER THAN 100.4 F (38 C) OR HIGHER *CHILLS OR SWEATING *NAUSEA AND VOMITING THAT IS NOT CONTROLLED WITH YOUR NAUSEA MEDICATION *UNUSUAL SHORTNESS OF BREATH *UNUSUAL BRUISING OR BLEEDING *URINARY PROBLEMS (pain or burning when urinating, or frequent urination) *BOWEL PROBLEMS (unusual diarrhea, constipation, pain near the anus) TENDERNESS IN MOUTH AND THROAT WITH OR WITHOUT PRESENCE OF ULCERS (sore throat, sores in mouth, or a toothache) UNUSUAL RASH, SWELLING OR PAIN  UNUSUAL VAGINAL DISCHARGE OR ITCHING   Items with * indicate a potential emergency and should be followed up as soon as possible or go to the Emergency Department if any problems should occur.  Please show the CHEMOTHERAPY ALERT CARD or  IMMUNOTHERAPY ALERT CARD at check-in to the Emergency Department and triage nurse.  Should you have questions after your visit or need to cancel or reschedule your appointment, please contact [facility] - A DEPT OF [facility]  [phone] and follow the prompts.  Office hours are 8:00 a.m. to 4:30 p.m. Monday - Friday. Please note that voicemails left after 4:00 p.m. may not be returned until the following business day.  We are closed weekends and major holidays. You have access to a nurse at all times for urgent questions. Please call the main number to the clinic [phone] and follow the prompts.  For any non-urgent questions, you may also contact your provider using MyChart. We now offer e-Visits for anyone 24 and older to request care online for non-urgent symptoms. For details visit [email or web address].   Also download the MyChart app! Go to the app store, search MyChart, open the app, select [place], and log in with your MyChart username and password.

## 2023-09-17 NOTE — Progress Notes (Signed)
 [facility] CONSULT NOTE  Patient Care Team: Justus Leita DEL, MD as PCP - General (Internal Medicine) Verdene Gills, RN as Oncology Nurse Navigator Rennie Cindy SAUNDERS, MD as Consulting Physician (Oncology)  CHIEF COMPLAINTS/PURPOSE OF CONSULTATION: cholangiocarcinoma  Oncology History Overview Note   Cisplatin  25 mg/m/gemcitabine  1000 mg/m/Durvalumab  -completed 8 cycles on 03/04/2023 Maintenance Durvalumab /gemcitabine -started 03/26/2023   ASSESSMENT & PLAN:  Vicki Phillips 58 y.o. 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   # Intrahepatic Cholangiocarcinoma, stage IV - s/p liver biopsy on 08/26/2022 showed metastatic moderately differentiated adenocarcinoma.  IHC stain positive for CK7 and CK20.  TTF-1, PAX8, GATA3 and CDX2 are negative.  Differentials include cholangiocarcinoma and pancreatic carcinoma.    -PD-L1 0%.  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   -s/p cisplatin , gemcitabine  and Durvalumab  every 3 weeks for 8 cycles.  Then on maintenance Durvalumab  and gemcitabine  until March 2025. -With rising CA 19-9, CT chest abdomen pelvis was done which unfortunately showed progression of the lung nodules.   - Based on FGFR 2 gene rearrangement, plan was to start futibatinib  16 mg.  Patient decided to start at full dose 20 mg on 06/04/2023.  Phosphorus is elevated to 7.5.  Dosing guidelines reviewed.  Will decrease dose to 16 mg once daily.  Weekly phosphorus level check.  Started on PhosLo  2 tabs with every meals.  If phosphorus level goes below 7 in the next 2 weeks, we will continue with futibatinib  16 mg once daily.   # JUNE 2025- HOLD futibatinib  sec to myalgias/arthralgias/ocular symptoms  # # JULY 14th, 2025-  Innumerable metastatic lung nodules with overall increase in the size of the nodules; Ill-defined mass in the left lobe of the liver with increase  in the calcifications-given the progressive disease recommend gemcitabine  Abraxane chemotherapy [steroid sparing].   # JULY 25th, 2025- Proceed with cycle #1-gemcitabine  Abraxane every 2 weeks;  # MAY 19th, 2025- LEFT DVT- on eliquis  [name] ]     Intrahepatic cholangiocarcinoma (HCC)  08/26/2022 Cancer Staging   Staging form: Intrahepatic Bile Duct, AJCC 8th Edition - Clinical stage from 08/26/2022: Stage IV (cM1) - Signed by Clista Bimler, MD on 09/03/2022 Stage prefix: Initial diagnosis   09/03/2022 Initial Diagnosis   Cholangiocarcinoma (HCC)   09/18/2022 - 04/30/2023 Chemotherapy   Patient is on Treatment Plan : BILIARY TRACT Cisplatin  + Gemcitabine  D1,8 + Durvalumab  (1500) D1 q21d / Durvalumab  (1500) q28d     09/17/2023 -  Chemotherapy   Patient is on Treatment Plan : PANCREATIC Abraxane D1,8,15 + Gemcitabine  D1,8,15 q28d     Metastasis to lung (HCC)  09/03/2022 Initial Diagnosis   Metastasis to lung (HCC)   09/18/2022 - 04/30/2023 Chemotherapy   Patient is on Treatment Plan : BILIARY TRACT Cisplatin  + Gemcitabine  D1,8 + Durvalumab  (1500) D1 q21d / Durvalumab  (1500) q28d     09/17/2023 -  Chemotherapy   Patient is on Treatment Plan : PANCREATIC Abraxane D1,8,15 + Gemcitabine  D1,8,15 q28d      HISTORY OF PRESENTING ILLNESS: Patient ambulating-independently. Accompanied by family- son.    Vicki Phillips 58 y.o.  female pleasant patient with a history of intrahepatic stage IV cholangiocarcinoma with metastasis to lungs is currently OFF- FGFR inhibitor [poor tolerance]- MAY 2025-  DVT of the left lower extremity-  Eliquis  is here for a follow up.  Appetite 50% normal, no supplement drinks, she is having some nausea. Pt states she has had some SOB  and cough for a few months   Currently on eliquis .    No palpitations. Denies any chest pains.   Review of Systems  Constitutional:  Positive for malaise/fatigue. Negative for chills, diaphoresis, fever and weight loss.  HENT:   Negative for nosebleeds and sore throat.   Eyes:  Negative for double vision.  Respiratory:  Negative for cough, hemoptysis, sputum production, shortness of breath and wheezing.   Cardiovascular:  Positive for leg swelling. Negative for chest pain, palpitations and orthopnea.  Gastrointestinal:  Negative for abdominal pain, blood in stool, constipation, diarrhea, heartburn, melena, nausea and vomiting.  Genitourinary:  Negative for dysuria, frequency and urgency.  Musculoskeletal:  Positive for back pain and joint pain.  Skin: Negative.  Negative for itching and rash.  Neurological:  Negative for dizziness, tingling, focal weakness, weakness and headaches.  Endo/Heme/Allergies:  Does not bruise/bleed easily.  Psychiatric/Behavioral:  Negative for depression. The patient is not nervous/anxious and does not have insomnia.     MEDICAL HISTORY:  Past Medical History:  Diagnosis Date   Allergy    Bile duct cancer (HCC)    Sarcoidosis 2011    SURGICAL HISTORY: Past Surgical History:  Procedure Laterality Date   CESAREAN SECTION     x 2   COLONOSCOPY WITH PROPOFOL  N/A 08/10/2019   Procedure: COLONOSCOPY WITH PROPOFOL ;  Surgeon: Jinny Carmine, MD;  Location: [facility];  Service: Endoscopy;  Laterality: N/A;  priority 4   IR IMAGING GUIDED PORT INSERTION  09/11/2022   LUNG BIOPSY      SOCIAL HISTORY: Social History   Socioeconomic History   Marital status: Single    Spouse name: Not on file   Number of children: 2   Years of education: Not on file   Highest education level: Not on file  Occupational History   Not on file  Tobacco Use   Smoking status: Never   Smokeless tobacco: Never  Vaping Use   Vaping status: Never Used  Substance and Sexual Activity   Alcohol use: Not Currently    Alcohol/week: 4.0 standard drinks of alcohol    Types: 4 Glasses of wine per week   Drug use: No   Sexual activity: Yes    Partners: Male    Birth control/protection: Surgical   Other Topics Concern   Not on file  Social History Narrative   Not on file   Social Drivers of Health   Financial Resource Strain: Low Risk  (09/18/2022)   Overall Financial Resource Strain (CARDIA)    Difficulty of Paying Living Expenses: Not hard at all  Food Insecurity: No Food Insecurity (08/24/2022)   Hunger Vital Sign    Worried About Running Out of Food in the Last Year: Never true    Ran Out of Food in the Last Year: Never true  Transportation Needs: No Transportation Needs (08/24/2022)   PRAPARE - Administrator, Civil Service (Medical): No    Lack of Transportation (Non-Medical): No  Physical Activity: Not on file  Stress: Not on file  Social Connections: Not on file  Intimate Partner Violence: Not At Risk (08/24/2022)   Humiliation, Afraid, Rape, and Kick questionnaire    Fear of Current or Ex-Partner: No    Emotionally Abused: No    Physically Abused: No    Sexually Abused: No    FAMILY HISTORY: Family History  Problem Relation Age of Onset   Stroke Mother    Heart disease Mother    Heart attack Father  80   Heart disease Father    Kidney disease Sister     ALLERGIES:  is allergic to other.  MEDICATIONS:  Current Outpatient Medications  Medication Sig Dispense Refill   apixaban  (ELIQUIS ) 5 MG TABS tablet Take 1 tablet (5 mg total) by mouth 2 (two) times daily. 60 tablet 5   diphenoxylate-atropine (LOMOTIL) 2.5-0.025 MG tablet Take 1 tablet by mouth 4 (four) times daily as needed for diarrhea or loose stools. Take it along with immodium 60 tablet 0   loratadine (CLARITIN) 10 MG tablet Take 10 mg by mouth daily.     OLANZapine  (ZYPREXA ) 10 MG tablet TAKE 1 TABLET(10 MG) BY MOUTH AT BEDTIME 30 tablet 0   ondansetron  (ZOFRAN ) 8 MG tablet Take 1 tablet (8 mg total) by mouth every 8 (eight) hours as needed for nausea or vomiting. 90 tablet 0   prochlorperazine  (COMPAZINE ) 10 MG tablet Take 1 tablet (10 mg total) by mouth every 6 (six) hours as needed for  nausea or vomiting. 30 tablet 2   amLODipine  (NORVASC ) 5 MG tablet Take 1 tablet (5 mg total) by mouth daily. (Patient not taking: Reported on 09/17/2023) 90 tablet 1   futibatinib , 12 mg daily dose, (LYTGOBI ) 4 MG tablet Take 3 tablets (12 mg total) by mouth daily. (Patient not taking: Reported on 09/17/2023) 84 tablet 0   No current facility-administered medications for this visit.   Facility-Administered Medications Ordered in Other Visits  Medication Dose Route Frequency Provider Last Rate Last Admin   0.9 %  sodium chloride  infusion   Intravenous Continuous Flordia Kassem R, MD   Stopped at 09/17/23 1223   sodium chloride  flush (NS) 0.9 % injection 10 mL  10 mL Intracatheter PRN Greysin Medlen R, MD   10 mL at 09/17/23 1224    PHYSICAL EXAMINATION:   Vitals:   09/17/23 0847 09/17/23 0905  BP: (!) 142/90 (!) 128/95  Pulse: 99   Resp: 16   Temp: (!) 95.8 F (35.4 C)   SpO2: 94%     Filed Weights   09/17/23 0847  Weight: 224 lb 4.8 oz (101.7 kg)    LEFT LOWER extremity- Swelling and tenderness.   Physical Exam Vitals and nursing note reviewed.  HENT:     Head: Normocephalic and atraumatic.     Mouth/Throat:     Pharynx: Oropharynx is clear.  Eyes:     Extraocular Movements: Extraocular movements intact.     Pupils: Pupils are equal, round, and reactive to light.  Cardiovascular:     Rate and Rhythm: Normal rate and regular rhythm.  Pulmonary:     Comments: Decreased breath sounds bilaterally.  Abdominal:     Palpations: Abdomen is soft.  Musculoskeletal:        General: Normal range of motion.     Cervical back: Normal range of motion.  Skin:    General: Skin is warm.  Neurological:     General: No focal deficit present.     Mental Status: She is alert and oriented to person, place, and time.  Psychiatric:        Behavior: Behavior normal.        Judgment: Judgment normal.     LABORATORY DATA:  I have reviewed the data as listed Lab Results   Component Value Date   WBC 2.2 (L) 09/17/2023   HGB 12.9 09/17/2023   HCT 38.3 09/17/2023   MCV 106.7 (H) 09/17/2023   PLT 205 09/17/2023   Recent Labs  08/11/23 1404 09/01/23 1341 09/17/23 0849  NA 139 135 138  K 4.0 3.3* 3.6  CL 107 104 106  CO2 22 23 23   GLUCOSE 117* 122* 96  BUN 21* 12 9  CREATININE 0.98 0.75 0.66  CALCIUM  9.0 8.7* 8.7*  GFRNONAA >60 >60 >60  PROT 7.4 6.9 6.8  ALBUMIN 4.0 3.2* 3.6  AST 37 49* 66*  ALT 21 22 42  ALKPHOS 116 154* 117  BILITOT 1.2 0.8 0.7    RADIOGRAPHIC STUDIES: I have personally reviewed the radiological images as listed and agreed with the findings in the report. CT CHEST ABDOMEN PELVIS W CONTRAST Result Date: 09/02/2023 CLINICAL DATA:  Cholangiocarcinoma. EXAM: CT CHEST, ABDOMEN, AND PELVIS WITH CONTRAST TECHNIQUE: Multidetector CT imaging of the chest, abdomen and pelvis was performed following the standard protocol during bolus administration of intravenous contrast. RADIATION DOSE REDUCTION: This exam was performed according to the departmental dose-optimization program which includes automated exposure control, adjustment of the mA and/or kV according to patient size and/or use of iterative reconstruction technique. CONTRAST:  OMNIPAQUE  IOHEXOL  300 MG/ML  SOLN COMPARISON:  CT chest abdomen pelvis dated 05/11/2023. FINDINGS: CT CHEST FINDINGS Cardiovascular: There is no cardiomegaly or pericardial effusion. The thoracic aorta is unremarkable. The origins of the great vessels of the aortic arch and the central pulmonary arteries are patent. Right-sided Port-A-Cath with tip in the right atrium close to the cavoatrial junction. Mediastinum/Nodes: No hilar or mediastinal adenopathy. The esophagus and the thyroid  gland are grossly unremarkable. No mediastinal fluid collection. Lungs/Pleura: Innumerable metastatic lung nodules with overall increase in the size of the nodule since the prior CT. No pleural effusion or pneumothorax. The  central airways are patent. Musculoskeletal: Sclerotic osseous lesion in T3 as seen previously. Additional sclerotic lesion in the left T1 pedicle, increased in size since the prior CT. No acute osseous pathology. CT ABDOMEN PELVIS FINDINGS No intra-abdominal free air or free fluid. Hepatobiliary: Ill-defined mass in the left lobe of the liver measures approximately 5.6 x 5.5 cm. There is increase in the calcifications since the prior CT. The gallbladder is unremarkable. Pancreas: Unremarkable. No pancreatic ductal dilatation or surrounding inflammatory changes. Spleen: Normal in size without focal abnormality. Adrenals/Urinary Tract: The adrenal glands unremarkable. The kidneys, visualized ureters, and urinary bladder appear unremarkable. Stomach/Bowel: There is sigmoid diverticulosis. There is no bowel obstruction or active inflammation. The appendix is normal. Vascular/Lymphatic: The abdominal aorta and IVC unremarkable. No portal venous gas. Top-normal retroperitoneal and para-aortic lymph nodes with punctate calcifications measure up to 9 mm in short axis. Reproductive: The uterus is anteverted. No suspicious adnexal masses. Other: Anterior pelvic wall surgical scar. Musculoskeletal: Degenerative changes of the spine. No acute osseous pathology. IMPRESSION: 1. Innumerable metastatic lung nodules with overall increase in the size of the nodules since the prior CT. 2. Ill-defined mass in the left lobe of the liver with increase in the calcifications since the prior CT, possibly related to post treatment changes. 3. Sigmoid diverticulosis. No bowel obstruction. Normal appendix. 4.  Aortic Atherosclerosis (ICD10-I70.0). Electronically Signed   By: Vanetta Chou M.D.   On: 09/02/2023 12:07     Intrahepatic cholangiocarcinoma (HCC) # Intrahepatic Cholangiocarcinoma, stage IV- - s/p liver biopsy on 08/26/2022 showed metastatic moderately differentiated adenocarcinoma.  IHC stain positive for CK7 and CK20.   TTF-1, PAX8, GATA3 and CDX2 are negative.  Differentials include cholangiocarcinoma and pancreatic carcinoma. -PD-L1 0%.  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 stage IV intrahepatic cholangiocarcinoma- Cisplatin  25 mg/m/gemcitabine  1000  mg/m/Durvalumab  -completed 8 cycles on 03/04/2023; Maintenance Durvalumab /gemcitabine - 03/26/2023- l March 2025- ith rising CA 19-9, CT chest abdomen pelvis was done which unfortunately showed progression of the lung nodules. Full dose 20 mg on 06/04/2023.  JUNE 2025- Futibatinib  12 mg- ON HOLD due to ocular findings as well as aches, pains, and fatigue.   # JULY 14th, 2025-  Innumerable metastatic lung nodules with overall increase in the size of the nodules; Ill-defined mass in the left lobe of the liver with increase in the calcifications-given the progressive disease recommend gemcitabine  Abraxane chemotherapy [steroid sparing].   # JULY 25th, 2025- Proceed with cycle #1-gemcitabine  Abraxane every 2 weeks; Labs-CBC/chemistries were reviewed with the patient- ANC 1.2 Growth factor would be given as prophylaxis for chemotherapy-induced neutropenia to prevent febrile neutropenias. Discussed potential side effect- myalgias/arthralgias- # To help prevent pain from the booster injection recommend Claritin  and also can take tylenol   as needed.  Patient currently on Claritin daily for allergies.  # #  I reviewed at length the individual components with chemotherapy; and the schedule in detail.  I also discussed the potential side effects including but not limited to-increasing fatigue, nausea vomiting, diarrhea, hair loss, sores in the mouth, increase risk of infection and also neuropathy.  Also reviewed the multiple strategies to avoid/mitigate similar side effects including preemptive medications-for nausea vomiting.  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  Discussed with Rosa.  # MAY 19th,  2025-  DVT of the left lower extremity-  Eliquis  5 mg twice daily for 6 months; and then recommend 2.5 mg twice daily indefinitely given her underlying cholangiocarcinoma.   # Hyperphosphatemia: -Secondary to futibatinib  this should improve after stopping therapy monitor for now.    # Nausea/poor appetite- : recommend zofran /compazine  [anxiety]; recommend commplaince with zyprexa - 10 mg at bedtime-    # Diarrhea: -Chronic.  Imodium as needed-recommend adding Lomotil given the concern of diarrhea with Abraxane..   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 -Improving since off steroid.  Will monitor for now.  # Hypertension: -Continue with amlodipine  5 mg daily. stable    # Cough: -Continue with Tussionex as needed.  Declines any refills at this time.   # History of sarcoidosis- -Management per pulmonary; -Never required treatment.  # IV access; port flush q 2 M  # ACP:  PS-  # DISPOSITION:  # chemo today; D-3 injection # in 2  week-MD labs CBC CMP; CA 19-9; chemo; ; D-3 injection # follow up  in 4  week-MD labs CBC CMP; CA 19-9; chemo - ; D-3 injection -Dr.B  # 40 minutes face-to-face with the patient discussing the above plan of care; more than 50% of time spent on prognosis/ natural history; counseling and coordination.    Above plan of care was discussed with patient/family in detail.  My contact information was given to the patient/family.      Cindy JONELLE Joe, MD 09/17/2023 3:51 PM

## 2023-09-17 NOTE — Assessment & Plan Note (Addendum)
#   Intrahepatic Cholangiocarcinoma, stage IV- - s/p liver biopsy on 08/26/2022 showed metastatic moderately differentiated adenocarcinoma.  IHC stain positive for CK7 and CK20.  TTF-1, PAX8, GATA3 and CDX2 are negative.  Differentials include cholangiocarcinoma and pancreatic carcinoma. -PD-L1 0%.  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 stage IV intrahepatic cholangiocarcinoma- Cisplatin  25 mg/m/gemcitabine  1000 mg/m/Durvalumab  -completed 8 cycles on 03/04/2023; Maintenance Durvalumab /gemcitabine - 03/26/2023- l March 2025- ith rising CA 19-9, CT chest abdomen pelvis was done which unfortunately showed progression of the lung nodules. Full dose 20 mg on 06/04/2023.  JUNE 2025- Futibatinib  12 mg- ON HOLD due to ocular findings as well as aches, pains, and fatigue.   # JULY 14th, 2025-  Innumerable metastatic lung nodules with overall increase in the size of the nodules; Ill-defined mass in the left lobe of the liver with increase in the calcifications-given the progressive disease recommend gemcitabine  Abraxane chemotherapy [steroid sparing].   # JULY 25th, 2025- Proceed with cycle #1-gemcitabine  Abraxane every 2 weeks; Labs-CBC/chemistries were reviewed with the patient- ANC 1.2 Growth factor would be given as prophylaxis for chemotherapy-induced neutropenia to prevent febrile neutropenias. Discussed potential side effect- myalgias/arthralgias- # To help prevent pain from the booster injection recommend Claritin  and also can take tylenol   as needed.  Patient currently on Claritin daily for allergies.  # #  I reviewed at length the individual components with chemotherapy; and the schedule in detail.  I also discussed the potential side effects including but not limited to-increasing fatigue, nausea vomiting, diarrhea, hair loss, sores in the mouth, increase risk of infection and also neuropathy.  Also reviewed the multiple strategies to avoid/mitigate similar side effects including preemptive  medications-for nausea vomiting.  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  Discussed with Rosa.  # MAY 19th, 2025-  DVT of the left lower extremity-  Eliquis  5 mg twice daily for 6 months; and then recommend 2.5 mg twice daily indefinitely given her underlying cholangiocarcinoma.   # Hyperphosphatemia: -Secondary to futibatinib  this should improve after stopping therapy monitor for now.    # Nausea/poor appetite- : recommend zofran /compazine  [anxiety]; recommend commplaince with zyprexa - 10 mg at bedtime-    # Diarrhea: -Chronic.  Imodium as needed-recommend adding Lomotil given the concern of diarrhea with Abraxane..   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 -Improving since off steroid.  Will monitor for now.  # Hypertension: -Continue with amlodipine  5 mg daily. stable    # Cough: -Continue with Tussionex as needed.  Declines any refills at this time.   # History of sarcoidosis- -Management per pulmonary; -Never required treatment.  # IV access; port flush q 2 M  # JRE:inwz-  PS-  # DISPOSITION:  # chemo today; D-3 injection # in 2  week-MD labs CBC CMP; CA 19-9; chemo; ; D-3 injection # follow up  in 4  week-MD labs CBC CMP; CA 19-9; chemo - ; D-3 injection -Dr.B  # 40 minutes face-to-face with the patient discussing the above plan of care; more than 50% of time spent on prognosis/ natural history; counseling and coordination.

## 2023-09-17 NOTE — Progress Notes (Signed)
 Appetite 50% normal, no supplement drinks, she is having some nausea. Pt states she has had some SOB and cough for a few months.

## 2023-09-20 ENCOUNTER — Ambulatory Visit

## 2023-09-20 ENCOUNTER — Inpatient Hospital Stay

## 2023-09-20 DIAGNOSIS — Z7689 Persons encountering health services in other specified circumstances: Secondary | ICD-10-CM | POA: Diagnosis not present

## 2023-09-20 DIAGNOSIS — C7801 Secondary malignant neoplasm of right lung: Secondary | ICD-10-CM

## 2023-09-20 DIAGNOSIS — I10 Essential (primary) hypertension: Secondary | ICD-10-CM | POA: Diagnosis not present

## 2023-09-20 DIAGNOSIS — Z79899 Other long term (current) drug therapy: Secondary | ICD-10-CM | POA: Diagnosis not present

## 2023-09-20 DIAGNOSIS — Z5111 Encounter for antineoplastic chemotherapy: Secondary | ICD-10-CM | POA: Diagnosis not present

## 2023-09-20 DIAGNOSIS — C221 Intrahepatic bile duct carcinoma: Secondary | ICD-10-CM | POA: Diagnosis not present

## 2023-09-20 DIAGNOSIS — I825Z2 Chronic embolism and thrombosis of unspecified deep veins of left distal lower extremity: Secondary | ICD-10-CM | POA: Diagnosis not present

## 2023-09-20 DIAGNOSIS — C7802 Secondary malignant neoplasm of left lung: Secondary | ICD-10-CM | POA: Diagnosis not present

## 2023-09-20 DIAGNOSIS — Z7901 Long term (current) use of anticoagulants: Secondary | ICD-10-CM | POA: Diagnosis not present

## 2023-09-20 DIAGNOSIS — D869 Sarcoidosis, unspecified: Secondary | ICD-10-CM | POA: Diagnosis not present

## 2023-09-20 MED ORDER — PEGFILGRASTIM-JMDB 6 MG/0.6ML ~~LOC~~ SOSY
6.0000 mg | PREFILLED_SYRINGE | Freq: Once | SUBCUTANEOUS | Status: AC
  Administered 2023-09-20: 6 mg via SUBCUTANEOUS
  Filled 2023-09-20: qty 0.6

## 2023-09-30 DIAGNOSIS — H35372 Puckering of macula, left eye: Secondary | ICD-10-CM | POA: Diagnosis not present

## 2023-09-30 DIAGNOSIS — H0288B Meibomian gland dysfunction left eye, upper and lower eyelids: Secondary | ICD-10-CM | POA: Diagnosis not present

## 2023-09-30 DIAGNOSIS — H35342 Macular cyst, hole, or pseudohole, left eye: Secondary | ICD-10-CM | POA: Diagnosis not present

## 2023-09-30 DIAGNOSIS — H16223 Keratoconjunctivitis sicca, not specified as Sjogren's, bilateral: Secondary | ICD-10-CM | POA: Diagnosis not present

## 2023-09-30 DIAGNOSIS — H0288A Meibomian gland dysfunction right eye, upper and lower eyelids: Secondary | ICD-10-CM | POA: Diagnosis not present

## 2023-09-30 DIAGNOSIS — Z79899 Other long term (current) drug therapy: Secondary | ICD-10-CM | POA: Diagnosis not present

## 2023-09-30 DIAGNOSIS — H35722 Serous detachment of retinal pigment epithelium, left eye: Secondary | ICD-10-CM | POA: Diagnosis not present

## 2023-09-30 DIAGNOSIS — L718 Other rosacea: Secondary | ICD-10-CM | POA: Diagnosis not present

## 2023-10-01 ENCOUNTER — Encounter: Payer: Self-pay | Admitting: Nurse Practitioner

## 2023-10-01 ENCOUNTER — Inpatient Hospital Stay

## 2023-10-01 ENCOUNTER — Inpatient Hospital Stay: Admitting: Nurse Practitioner

## 2023-10-01 ENCOUNTER — Inpatient Hospital Stay: Attending: Internal Medicine

## 2023-10-01 VITALS — BP 130/94 | HR 91 | Temp 97.8°F | Resp 16 | Wt 226.0 lb

## 2023-10-01 VITALS — BP 131/98

## 2023-10-01 DIAGNOSIS — Z7901 Long term (current) use of anticoagulants: Secondary | ICD-10-CM | POA: Insufficient documentation

## 2023-10-01 DIAGNOSIS — Z86718 Personal history of other venous thrombosis and embolism: Secondary | ICD-10-CM | POA: Insufficient documentation

## 2023-10-01 DIAGNOSIS — C7802 Secondary malignant neoplasm of left lung: Secondary | ICD-10-CM

## 2023-10-01 DIAGNOSIS — Z79899 Other long term (current) drug therapy: Secondary | ICD-10-CM | POA: Diagnosis not present

## 2023-10-01 DIAGNOSIS — C7801 Secondary malignant neoplasm of right lung: Secondary | ICD-10-CM

## 2023-10-01 DIAGNOSIS — C221 Intrahepatic bile duct carcinoma: Secondary | ICD-10-CM | POA: Insufficient documentation

## 2023-10-01 DIAGNOSIS — Z5111 Encounter for antineoplastic chemotherapy: Secondary | ICD-10-CM | POA: Diagnosis not present

## 2023-10-01 LAB — CBC WITH DIFFERENTIAL (CANCER CENTER ONLY)
Abs Immature Granulocytes: 0.03 K/uL (ref 0.00–0.07)
Basophils Absolute: 0 K/uL (ref 0.0–0.1)
Basophils Relative: 0 %
Eosinophils Absolute: 0 K/uL (ref 0.0–0.5)
Eosinophils Relative: 0 %
HCT: 36.5 % (ref 36.0–46.0)
Hemoglobin: 12.3 g/dL (ref 12.0–15.0)
Immature Granulocytes: 1 %
Lymphocytes Relative: 15 %
Lymphs Abs: 0.7 K/uL (ref 0.7–4.0)
MCH: 36.6 pg — ABNORMAL HIGH (ref 26.0–34.0)
MCHC: 33.7 g/dL (ref 30.0–36.0)
MCV: 108.6 fL — ABNORMAL HIGH (ref 80.0–100.0)
Monocytes Absolute: 0.3 K/uL (ref 0.1–1.0)
Monocytes Relative: 6 %
Neutro Abs: 3.5 K/uL (ref 1.7–7.7)
Neutrophils Relative %: 78 %
Platelet Count: 163 K/uL (ref 150–400)
RBC: 3.36 MIL/uL — ABNORMAL LOW (ref 3.87–5.11)
RDW: 15.4 % (ref 11.5–15.5)
WBC Count: 4.5 K/uL (ref 4.0–10.5)
nRBC: 0 % (ref 0.0–0.2)

## 2023-10-01 LAB — CMP (CANCER CENTER ONLY)
ALT: 40 U/L (ref 0–44)
AST: 58 U/L — ABNORMAL HIGH (ref 15–41)
Albumin: 3.6 g/dL (ref 3.5–5.0)
Alkaline Phosphatase: 135 U/L — ABNORMAL HIGH (ref 38–126)
Anion gap: 8 (ref 5–15)
BUN: 9 mg/dL (ref 6–20)
CO2: 23 mmol/L (ref 22–32)
Calcium: 8.9 mg/dL (ref 8.9–10.3)
Chloride: 105 mmol/L (ref 98–111)
Creatinine: 0.69 mg/dL (ref 0.44–1.00)
GFR, Estimated: >60 mL/min (ref >60)
Glucose, Bld: 107 mg/dL — ABNORMAL HIGH (ref 70–99)
Potassium: 4.2 mmol/L (ref 3.5–5.1)
Sodium: 136 mmol/L (ref 135–145)
Total Bilirubin: 0.5 mg/dL (ref 0.0–1.2)
Total Protein: 7 g/dL (ref 6.5–8.1)

## 2023-10-01 MED ORDER — SODIUM CHLORIDE 0.9 % IV SOLN
INTRAVENOUS | Status: DC
  Filled 2023-10-01: qty 250

## 2023-10-01 MED ORDER — PACLITAXEL PROTEIN-BOUND CHEMO INJECTION 100 MG
125.0000 mg/m2 | Freq: Once | INTRAVENOUS | Status: AC
  Administered 2023-10-01: 300 mg via INTRAVENOUS
  Filled 2023-10-01: qty 60

## 2023-10-01 MED ORDER — SODIUM CHLORIDE 0.9 % IV SOLN
1000.0000 mg/m2 | Freq: Once | INTRAVENOUS | Status: AC
  Administered 2023-10-01: 2242 mg via INTRAVENOUS
  Filled 2023-10-01: qty 58.97

## 2023-10-01 MED ORDER — PROCHLORPERAZINE MALEATE 10 MG PO TABS
10.0000 mg | ORAL_TABLET | Freq: Once | ORAL | Status: AC
  Administered 2023-10-01: 10 mg via ORAL
  Filled 2023-10-01: qty 1

## 2023-10-01 NOTE — Progress Notes (Signed)
 [facility] CONSULT NOTE  Patient Care Team: Justus Leita DEL, MD as PCP - General (Internal Medicine) Verdene Gills, RN as Oncology Nurse Navigator Rennie Cindy SAUNDERS, MD as Consulting Physician (Oncology)  CHIEF COMPLAINTS/PURPOSE OF CONSULTATION: Cholangiocarcinoma  Oncology History Overview Note   Cisplatin  25 mg/m/gemcitabine  1000 mg/m/Durvalumab  -completed 8 cycles on 03/04/2023 Maintenance Durvalumab /gemcitabine -started 03/26/2023   ASSESSMENT & PLAN:  Vicki Phillips 58 y.o. 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   # Intrahepatic Cholangiocarcinoma, stage IV - s/p liver biopsy on 08/26/2022 showed metastatic moderately differentiated adenocarcinoma.  IHC stain positive for CK7 and CK20.  TTF-1, PAX8, GATA3 and CDX2 are negative.  Differentials include cholangiocarcinoma and pancreatic carcinoma.    -PD-L1 0%.  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   -s/p cisplatin , gemcitabine  and Durvalumab  every 3 weeks for 8 cycles.  Then on maintenance Durvalumab  and gemcitabine  until March 2025. -With rising CA 19-9, CT chest abdomen pelvis was done which unfortunately showed progression of the lung nodules.   - Based on FGFR 2 gene rearrangement, plan was to start futibatinib  16 mg.  Patient decided to start at full dose 20 mg on 06/04/2023.  Phosphorus is elevated to 7.5.  Dosing guidelines reviewed.  Will decrease dose to 16 mg once daily.  Weekly phosphorus level check.  Started on PhosLo  2 tabs with every meals.  If phosphorus level goes below 7 in the next 2 weeks, we will continue with futibatinib  16 mg once daily.   # JUNE 2025- HOLD futibatinib  sec to myalgias/arthralgias/ocular symptoms  # # JULY 14th, 2025-  Innumerable metastatic lung nodules with overall increase in the size of the nodules; Ill-defined mass in the left lobe of the liver with increase  in the calcifications-given the progressive disease recommend gemcitabine  Abraxane  chemotherapy [steroid sparing].   # JULY 25th, 2025- Proceed with cycle #1-gemcitabine  Abraxane  every 2 weeks;  # MAY 19th, 2025- LEFT DVT- on eliquis  [name] ]     Intrahepatic cholangiocarcinoma (HCC)  08/26/2022 Cancer Staging   Staging form: Intrahepatic Bile Duct, AJCC 8th Edition - Clinical stage from 08/26/2022: Stage IV (cM1) - Signed by Clista Bimler, MD on 09/03/2022 Stage prefix: Initial diagnosis   09/03/2022 Initial Diagnosis   Cholangiocarcinoma (HCC)   09/18/2022 - 04/30/2023 Chemotherapy   Patient is on Treatment Plan : BILIARY TRACT Cisplatin  + Gemcitabine  D1,8 + Durvalumab  (1500) D1 q21d / Durvalumab  (1500) q28d     09/17/2023 -  Chemotherapy   Patient is on Treatment Plan : PANCREATIC Abraxane  D1,8,15 + Gemcitabine  D1,8,15 q28d     Metastasis to lung (HCC)  09/03/2022 Initial Diagnosis   Metastasis to lung (HCC)   09/18/2022 - 04/30/2023 Chemotherapy   Patient is on Treatment Plan : BILIARY TRACT Cisplatin  + Gemcitabine  D1,8 + Durvalumab  (1500) D1 q21d / Durvalumab  (1500) q28d     09/17/2023 -  Chemotherapy   Patient is on Treatment Plan : PANCREATIC Abraxane  D1,8,15 + Gemcitabine  D1,8,15 q28d      HISTORY OF PRESENTING ILLNESS: Patient ambulating-independently. Accompanied by family- son.    Vicki Phillips 58 y.o. female pleasant patient with a history of intrahepatic stage IV cholangiocarcinoma with metastasis to lungs is currently OFF- FGFR inhibitor [poor tolerance]- MAY 2025-  DVT of the left lower extremity-  Eliquis  is here for a follow up. Had nausea over the weekend. Weight is up. Otherwise denies complaints. Feels she is tolerating this treatment better.  Review of Systems  Constitutional:  Positive for malaise/fatigue. Negative for chills, diaphoresis, fever and weight loss.  HENT:  Negative for nosebleeds and sore throat.   Eyes:  Negative for double vision.   Respiratory:  Negative for cough, hemoptysis, sputum production, shortness of breath and wheezing.   Cardiovascular:  Positive for leg swelling. Negative for chest pain, palpitations and orthopnea.  Gastrointestinal:  Negative for abdominal pain, blood in stool, constipation, diarrhea, heartburn, melena, nausea and vomiting.  Genitourinary:  Negative for dysuria, frequency and urgency.  Musculoskeletal:  Positive for back pain and joint pain.  Skin: Negative.  Negative for itching and rash.  Neurological:  Negative for dizziness, tingling, focal weakness, weakness and headaches.  Endo/Heme/Allergies:  Does not bruise/bleed easily.  Psychiatric/Behavioral:  Negative for depression. The patient is not nervous/anxious and does not have insomnia.    MEDICAL HISTORY:  Past Medical History:  Diagnosis Date   Allergy    Bile duct cancer (HCC)    Sarcoidosis 2011   SURGICAL HISTORY: Past Surgical History:  Procedure Laterality Date   CESAREAN SECTION     x 2   COLONOSCOPY WITH PROPOFOL  N/A 08/10/2019   Procedure: COLONOSCOPY WITH PROPOFOL ;  Surgeon: Jinny Carmine, MD;  Location: [facility];  Service: Endoscopy;  Laterality: N/A;  priority 4   IR IMAGING GUIDED PORT INSERTION  09/11/2022   LUNG BIOPSY     SOCIAL HISTORY: Social History   Socioeconomic History   Marital status: Single    Spouse name: Not on file   Number of children: 2   Years of education: Not on file   Highest education level: Not on file  Occupational History   Not on file  Tobacco Use   Smoking status: Never   Smokeless tobacco: Never  Vaping Use   Vaping status: Never Used  Substance and Sexual Activity   Alcohol use: Not Currently    Alcohol/week: 4.0 standard drinks of alcohol    Types: 4 Glasses of wine per week   Drug use: No   Sexual activity: Yes    Partners: Male    Birth control/protection: Surgical  Other Topics Concern   Not on file  Social History Narrative   Not on file   Social  Drivers of Health   Financial Resource Strain: Low Risk  (09/18/2022)   Overall Financial Resource Strain (CARDIA)    Difficulty of Paying Living Expenses: Not hard at all  Food Insecurity: No Food Insecurity (08/24/2022)   Hunger Vital Sign    Worried About Running Out of Food in the Last Year: Never true    Ran Out of Food in the Last Year: Never true  Transportation Needs: No Transportation Needs (08/24/2022)   PRAPARE - Administrator, Civil Service (Medical): No    Lack of Transportation (Non-Medical): No  Physical Activity: Not on file  Stress: Not on file  Social Connections: Not on file  Intimate Partner Violence: Not At Risk (08/24/2022)   Humiliation, Afraid, Rape, and Kick questionnaire    Fear of Current or Ex-Partner: No    Emotionally Abused: No    Physically Abused: No    Sexually Abused: No   FAMILY HISTORY: Family History  Problem Relation Age of Onset   Stroke Mother    Heart disease Mother    Heart attack Father 37   Heart disease Father    Kidney disease Sister    ALLERGIES:  is allergic to other.  MEDICATIONS:  Current Outpatient  Medications  Medication Sig Dispense Refill   apixaban  (ELIQUIS ) 5 MG TABS tablet Take 1 tablet (5 mg total) by mouth 2 (two) times daily. 60 tablet 5   diphenoxylate -atropine  (LOMOTIL ) 2.5-0.025 MG tablet Take 1 tablet by mouth 4 (four) times daily as needed for diarrhea or loose stools. Take it along with immodium 60 tablet 0   loratadine (CLARITIN) 10 MG tablet Take 10 mg by mouth daily.     OLANZapine  (ZYPREXA ) 10 MG tablet TAKE 1 TABLET(10 MG) BY MOUTH AT BEDTIME 30 tablet 0   ondansetron  (ZOFRAN ) 8 MG tablet Take 1 tablet (8 mg total) by mouth every 8 (eight) hours as needed for nausea or vomiting. 90 tablet 0   prochlorperazine  (COMPAZINE ) 10 MG tablet Take 1 tablet (10 mg total) by mouth every 6 (six) hours as needed for nausea or vomiting. 30 tablet 2   amLODipine  (NORVASC ) 5 MG tablet Take 1 tablet (5 mg total) by  mouth daily. (Patient not taking: Reported on 10/01/2023) 90 tablet 1   futibatinib , 12 mg daily dose, (LYTGOBI ) 4 MG tablet Take 3 tablets (12 mg total) by mouth daily. (Patient not taking: Reported on 10/01/2023) 84 tablet 0   No current facility-administered medications for this visit.    PHYSICAL EXAMINATION: Vitals:   10/01/23 0851 10/01/23 0913  BP: (!) 144/107 (!) 130/94  Pulse: 91   Resp: 16   Temp: 97.8 F (36.6 C)   SpO2: 98%    Filed Weights   10/01/23 0851  Weight: 226 lb (102.5 kg)   Physical Exam Vitals and nursing note reviewed.  HENT:     Head: Normocephalic and atraumatic.     Mouth/Throat:     Pharynx: Oropharynx is clear.  Eyes:     Extraocular Movements: Extraocular movements intact.     Pupils: Pupils are equal, round, and reactive to light.  Cardiovascular:     Rate and Rhythm: Normal rate and regular rhythm.  Pulmonary:     Comments: Decreased breath sounds bilaterally.  Abdominal:     Palpations: Abdomen is soft.  Musculoskeletal:        General: Normal range of motion.     Cervical back: Normal range of motion.     Comments: LEFT LOWER extremity- Swelling and tenderness.   Skin:    General: Skin is warm.  Neurological:     General: No focal deficit present.     Mental Status: She is alert and oriented to person, place, and time.  Psychiatric:        Behavior: Behavior normal.        Judgment: Judgment normal.    LABORATORY DATA:  I have reviewed the data as listed Lab Results  Component Value Date   WBC 4.5 10/01/2023   HGB 12.3 10/01/2023   HCT 36.5 10/01/2023   MCV 108.6 (H) 10/01/2023   PLT 163 10/01/2023   Recent Labs    09/01/23 1341 09/17/23 0849 10/01/23 0834  NA 135 138 136  K 3.3* 3.6 4.2  CL 104 106 105  CO2 23 23 23   GLUCOSE 122* 96 107*  BUN 12 9 9   CREATININE 0.75 0.66 0.69  CALCIUM  8.7* 8.7* 8.9  GFRNONAA >60 >60 >60  PROT 6.9 6.8 7.0  ALBUMIN 3.2* 3.6 3.6  AST 49* 66* 58*  ALT 22 42 40  ALKPHOS 154* 117  135*  BILITOT 0.8 0.7 0.5    RADIOGRAPHIC STUDIES: I have personally reviewed the radiological images as listed and agreed with  the findings in the report. CT CHEST ABDOMEN PELVIS W CONTRAST Result Date: 09/02/2023 CLINICAL DATA:  Cholangiocarcinoma. EXAM: CT CHEST, ABDOMEN, AND PELVIS WITH CONTRAST TECHNIQUE: Multidetector CT imaging of the chest, abdomen and pelvis was performed following the standard protocol during bolus administration of intravenous contrast. RADIATION DOSE REDUCTION: This exam was performed according to the departmental dose-optimization program which includes automated exposure control, adjustment of the mA and/or kV according to patient size and/or use of iterative reconstruction technique. CONTRAST:  OMNIPAQUE  IOHEXOL  300 MG/ML  SOLN COMPARISON:  CT chest abdomen pelvis dated 05/11/2023. FINDINGS: CT CHEST FINDINGS Cardiovascular: There is no cardiomegaly or pericardial effusion. The thoracic aorta is unremarkable. The origins of the great vessels of the aortic arch and the central pulmonary arteries are patent. Right-sided Port-A-Cath with tip in the right atrium close to the cavoatrial junction. Mediastinum/Nodes: No hilar or mediastinal adenopathy. The esophagus and the thyroid  gland are grossly unremarkable. No mediastinal fluid collection. Lungs/Pleura: Innumerable metastatic lung nodules with overall increase in the size of the nodule since the prior CT. No pleural effusion or pneumothorax. The central airways are patent. Musculoskeletal: Sclerotic osseous lesion in T3 as seen previously. Additional sclerotic lesion in the left T1 pedicle, increased in size since the prior CT. No acute osseous pathology. CT ABDOMEN PELVIS FINDINGS No intra-abdominal free air or free fluid. Hepatobiliary: Ill-defined mass in the left lobe of the liver measures approximately 5.6 x 5.5 cm. There is increase in the calcifications since the prior CT. The gallbladder is unremarkable. Pancreas:  Unremarkable. No pancreatic ductal dilatation or surrounding inflammatory changes. Spleen: Normal in size without focal abnormality. Adrenals/Urinary Tract: The adrenal glands unremarkable. The kidneys, visualized ureters, and urinary bladder appear unremarkable. Stomach/Bowel: There is sigmoid diverticulosis. There is no bowel obstruction or active inflammation. The appendix is normal. Vascular/Lymphatic: The abdominal aorta and IVC unremarkable. No portal venous gas. Top-normal retroperitoneal and para-aortic lymph nodes with punctate calcifications measure up to 9 mm in short axis. Reproductive: The uterus is anteverted. No suspicious adnexal masses. Other: Anterior pelvic wall surgical scar. Musculoskeletal: Degenerative changes of the spine. No acute osseous pathology. IMPRESSION: 1. Innumerable metastatic lung nodules with overall increase in the size of the nodules since the prior CT. 2. Ill-defined mass in the left lobe of the liver with increase in the calcifications since the prior CT, possibly related to post treatment changes. 3. Sigmoid diverticulosis. No bowel obstruction. Normal appendix. 4.  Aortic Atherosclerosis (ICD10-I70.0). Electronically Signed   By: Vanetta Chou M.D.   On: 09/02/2023 12:07    Assessment & Plan:   Intrahepatic cholangiocarcinoma # Intrahepatic Cholangiocarcinoma, stage IV- - s/p liver biopsy on 08/26/2022 showed metastatic moderately differentiated adenocarcinoma.  IHC stain positive for CK7 and CK20.  TTF-1, PAX8, GATA3 and CDX2 are negative.  Differentials include cholangiocarcinoma and pancreatic carcinoma. -PD-L1 0%.  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 stage IV intrahepatic cholangiocarcinoma- Cisplatin  25 mg/m/gemcitabine  1000 mg/m/Durvalumab  -completed 8 cycles on 03/04/2023; Maintenance Durvalumab /gemcitabine - 03/26/2023- l March 2025- ith rising CA 19-9, CT chest abdomen pelvis was done which unfortunately showed progression of the lung  nodules. Full dose 20 mg on 06/04/2023.  JUNE 2025- Futibatinib  12 mg- ON HOLD due to ocular findings as well as aches, pains, and fatigue.    # JULY 14th, 2025-  Innumerable metastatic lung nodules with overall increase in the size of the nodules; Ill-defined mass in the left lobe of the liver with increase in the calcifications- given the progressive disease recommend  gemcitabine  Abraxane  chemotherapy [steroid sparing].    # She is currently status post day 1 cycle 1 of gemcitabine  Abraxane .  Here today for consideration of day 15 cycle 1.  Labs reviewed and acceptable for continuation of treatment.  She received G-CSF each dose of chemo On day 3.   # G-CSF related myalgias and arthralgias-continue Claritin and Tylenol .  Well-controlled.  # Risk of peripheral neuropathy- recommended cold packs/gloves   # MAY 19th, 2025-  DVT of the left lower extremity-  Eliquis  5 mg twice daily for 6 months; and then recommend 2.5 mg twice daily indefinitely given her underlying cholangiocarcinoma.   # Hyperphosphatemia: -Secondary to futibatinib  this should improve after stopping therapy monitor for now.    # Nausea/poor appetite- : recommend zofran /compazine  [anxiety]; recommend commplaince with zyprexa - 10 mg at bedtime. Zyprexa  improving symptoms.    # Diarrhea: -Chronic.  Imodium as needed-recommend adding Lomotil  given the concern of diarrhea with Abraxane ..   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 Improving since off steroid.  Will monitor for now.   # Hypertension: -Continue with amlodipine  5 mg daily. stable    # Cough: -Continue with Tussionex as needed.  Declines any refills at this time.   # History of sarcoidosis- -Management per pulmonary. Never required treatment.   # IV access; port flush q 2 M   # ACP:   PS-   DISPOSITION:  # chemo today; D-3 injection # 2  weeks- lab (cbc, cmp, ca19-9), Dr Rennie, gem/abraxane  # 4 weeks-  lab (cbc,  cmp, ca19-9), Dr Rennie, gem/abraxane  # D3- GCSF- la  No problem-specific Assessment & Plan notes found for this encounter.  Above plan of care was discussed with patient/family in detail.  My contact information was given to the patient/family.    Tinnie KANDICE Dawn, NP 10/01/2023

## 2023-10-01 NOTE — Patient Instructions (Signed)
 [facility] - A DEPT OF [facility]  Discharge Instructions: Thank you for choosing [facility] to provide your oncology and hematology care.  If you have a lab appointment with the Cancer Center, please go directly to the Cancer Center and check in at the registration area.  Wear comfortable clothing and clothing appropriate for easy access to any Portacath or PICC line.   We strive to give you quality time with your provider. You may need to reschedule your appointment if you arrive late (15 or more minutes).  Arriving late affects you and other patients whose appointments are after yours.  Also, if you miss three or more appointments without notifying the office, you may be dismissed from the clinic at the provider's discretion.      For prescription refill requests, have your pharmacy contact our office and allow 72 hours for refills to be completed.    Today you received the following chemotherapy and/or immunotherapy agents GEMZAR  and ABRAXENE      To help prevent nausea and vomiting after your treatment, we encourage you to take your nausea medication as directed.  BELOW ARE SYMPTOMS THAT SHOULD BE REPORTED IMMEDIATELY: *FEVER GREATER THAN 100.4 F (38 C) OR HIGHER *CHILLS OR SWEATING *NAUSEA AND VOMITING THAT IS NOT CONTROLLED WITH YOUR NAUSEA MEDICATION *UNUSUAL SHORTNESS OF BREATH *UNUSUAL BRUISING OR BLEEDING *URINARY PROBLEMS (pain or burning when urinating, or frequent urination) *BOWEL PROBLEMS (unusual diarrhea, constipation, pain near the anus) TENDERNESS IN MOUTH AND THROAT WITH OR WITHOUT PRESENCE OF ULCERS (sore throat, sores in mouth, or a toothache) UNUSUAL RASH, SWELLING OR PAIN  UNUSUAL VAGINAL DISCHARGE OR ITCHING   Items with * indicate a potential emergency and should be followed up as soon as possible or go to the Emergency Department if any problems should occur.  Please show the CHEMOTHERAPY ALERT CARD or  IMMUNOTHERAPY ALERT CARD at check-in to the Emergency Department and triage nurse.  Should you have questions after your visit or need to cancel or reschedule your appointment, please contact [facility] - A DEPT OF [facility]  [phone] and follow the prompts.  Office hours are 8:00 a.m. to 4:30 p.m. Monday - Friday. Please note that voicemails left after 4:00 p.m. may not be returned until the following business day.  We are closed weekends and major holidays. You have access to a nurse at all times for urgent questions. Please call the main number to the clinic [phone] and follow the prompts.  For any non-urgent questions, you may also contact your provider using MyChart. We now offer e-Visits for anyone 67 and older to request care online for non-urgent symptoms. For details visit [email or web address].   Also download the MyChart app! Go to the app store, search MyChart, open the app, select [facility], and log in with your MyChart username and password.  Gemcitabine  Injection What is this medication? GEMCITABINE  (jem SYE ta been) treats some types of cancer. It works by slowing down the growth of cancer cells. This medicine may be used for other purposes; ask your health care provider or pharmacist if you have questions. COMMON BRAND NAME(S): Gemzar , Infugem  What should I tell my care team before I take this medication? They need to know if you have any of these conditions: Blood disorders Infection Kidney disease Liver disease Lung or breathing disease, such as asthma or COPD Recent or ongoing radiation therapy An unusual  or allergic reaction to gemcitabine , other medications, foods, dyes, or preservatives If you or your partner are pregnant or trying to get pregnant Breast-feeding How should I use this medication? This medication is injected into a vein. It is given by your care team in a hospital or clinic setting. Talk to your care  team about the use of this medication in children. Special care may be needed. Overdosage: If you think you have taken too much of this medicine contact a poison control center or emergency room at once. NOTE: This medicine is only for you. Do not share this medicine with others. What if I miss a dose? Keep appointments for follow-up doses. It is important not to miss your dose. Call your care team if you are unable to keep an appointment. What may interact with this medication? Interactions have not been studied. This list may not describe all possible interactions. Give your health care provider a list of all the medicines, herbs, non-prescription drugs, or dietary supplements you use. Also tell them if you smoke, drink alcohol, or use illegal drugs. Some items may interact with your medicine. What should I watch for while using this medication? Your condition will be monitored carefully while you are receiving this medication. This medication may make you feel generally unwell. This is not uncommon, as chemotherapy can affect healthy cells as well as cancer cells. Report any side effects. Continue your course of treatment even though you feel ill unless your care team tells you to stop. In some cases, you may be given additional medications to help with side effects. Follow all directions for their use. This medication may increase your risk of getting an infection. Call your care team for advice if you get a fever, chills, sore throat, or other symptoms of a cold or flu. Do not treat yourself. Try to avoid being around people who are sick. This medication may increase your risk to bruise or bleed. Call your care team if you notice any unusual bleeding. Be careful brushing or flossing your teeth or using a toothpick because you may get an infection or bleed more easily. If you have any dental work done, tell your dentist you are receiving this medication. Avoid taking medications that contain  aspirin, acetaminophen , ibuprofen, naproxen, or ketoprofen unless instructed by your care team. These medications may hide a fever. Talk to your care team if you or your partner wish to become pregnant or think you might be pregnant. This medication can cause serious birth defects if taken during pregnancy and for 6 months after the last dose. A negative pregnancy test is required before starting this medication. A reliable form of contraception is recommended while taking this medication and for 6 months after the last dose. Talk to your care team about effective forms of contraception. Do not father a child while taking this medication and for 3 months after the last dose. Use a condom while having sex during this time period. Do not breastfeed while taking this medication and for at least 1 week after the last dose. This medication may cause infertility. Talk to your care team if you are concerned about your fertility. What side effects may I notice from receiving this medication? Side effects that you should report to your care team as soon as possible: Allergic reactions--skin rash, itching, hives, swelling of the face, lips, tongue, or throat Capillary leak syndrome--stomach or muscle pain, unusual weakness or fatigue, feeling faint or lightheaded, decrease in the amount of urine,  swelling of the ankles, hands, or feet, trouble breathing Infection--fever, chills, cough, sore throat, wounds that don't heal, pain or trouble when passing urine, general feeling of discomfort or being unwell Liver injury--right upper belly pain, loss of appetite, nausea, light-colored stool, dark yellow or brown urine, yellowing skin or eyes, unusual weakness or fatigue Low red blood cell level--unusual weakness or fatigue, dizziness, headache, trouble breathing Lung injury--shortness of breath or trouble breathing, cough, spitting up blood, chest pain, fever Stomach pain, bloody diarrhea, pale skin, unusual weakness or  fatigue, decrease in the amount of urine, which may be signs of hemolytic uremic syndrome Sudden and severe headache, confusion, change in vision, seizures, which may be signs of posterior reversible encephalopathy syndrome (PRES) Unusual bruising or bleeding Side effects that usually do not require medical attention (report to your care team if they continue or are bothersome): Diarrhea Drowsiness Hair loss Nausea Pain, redness, or swelling with sores inside the mouth or throat Vomiting This list may not describe all possible side effects. Call your doctor for medical advice about side effects. You may report side effects to FDA at 1-800-FDA-1088. Where should I keep my medication? This medication is given in a hospital or clinic. It will not be stored at home. NOTE: This sheet is a summary. It may not cover all possible information. If you have questions about this medicine, talk to your doctor, pharmacist, or health care provider.  2024 Elsevier/Gold Standard (2021-06-17 00:00:00)  Paclitaxel  Nanoparticle Albumin-Bound Injection What is this medication? NANOPARTICLE ALBUMIN-BOUND PACLITAXEL  (Na no PAHR ti kuhl al BYOO muhn-bound PAK li TAX el) treats some types of cancer. It works by slowing down the growth of cancer cells. This medicine may be used for other purposes; ask your health care provider or pharmacist if you have questions. COMMON BRAND NAME(S): Abraxane  What should I tell my care team before I take this medication? They need to know if you have any of these conditions: Liver disease Low white blood cell levels An unusual or allergic reaction to paclitaxel , albumin, other medications, foods, dyes, or preservatives If you or your partner are pregnant or trying to get pregnant Breast-feeding How should I use this medication? This medication is injected into a vein. It is given by your care team in a hospital or clinic setting. Talk to your care team about the use of this  medication in children. Special care may be needed. Overdosage: If you think you have taken too much of this medicine contact a poison control center or emergency room at once. NOTE: This medicine is only for you. Do not share this medicine with others. What if I miss a dose? Keep appointments for follow-up doses. It is important not to miss your dose. Call your care team if you are unable to keep an appointment. What may interact with this medication? Other medications may affect the way this medication works. Talk with your care team about all of the medications you take. They may suggest changes to your treatment plan to lower the risk of side effects and to make sure your medications work as intended. This list may not describe all possible interactions. Give your health care provider a list of all the medicines, herbs, non-prescription drugs, or dietary supplements you use. Also tell them if you smoke, drink alcohol, or use illegal drugs. Some items may interact with your medicine. What should I watch for while using this medication? Your condition will be monitored carefully while you are receiving this medication. You  may need blood work while taking this medication. This medication may make you feel generally unwell. This is not uncommon as chemotherapy can affect healthy cells as well as cancer cells. Report any side effects. Continue your course of treatment even though you feel ill unless your care team tells you to stop. This medication can cause serious allergic reactions. To reduce the risk, your care team may give you other medications to take before receiving this one. Be sure to follow the directions from your care team. This medication may increase your risk of getting an infection. Call your care team for advice if you get a fever, chills, sore throat, or other symptoms of a cold or flu. Do not treat yourself. Try to avoid being around people who are sick. This medication may increase  your risk to bruise or bleed. Call your care team if you notice any unusual bleeding. Be careful brushing or flossing your teeth or using a toothpick because you may get an infection or bleed more easily. If you have any dental work done, tell your dentist you are receiving this medication. Talk to your care team if you or your partner may be pregnant. Serious birth defects can occur if you take this medication during pregnancy and for 6 months after the last dose. You will need a negative pregnancy test before starting this medication. Contraception is recommended while taking this medication and for 6 months after the last dose. Your care team can help you find the option that works for you. If your partner can get pregnant, use a condom during sex while taking this medication and for 3 months after the last dose. Do not breastfeed while taking this medication and for 2 weeks after the last dose. This medication may cause infertility. Talk to your care team if you are concerned about your fertility. What side effects may I notice from receiving this medication? Side effects that you should report to your care team as soon as possible: Allergic reactions--skin rash, itching, hives, swelling of the face, lips, tongue, or throat Dry cough, shortness of breath or trouble breathing Infection--fever, chills, cough, sore throat, wounds that don't heal, pain or trouble when passing urine, general feeling of discomfort or being unwell Low red blood cell level--unusual weakness or fatigue, dizziness, headache, trouble breathing Pain, tingling, or numbness in the hands or feet Stomach pain, unusual weakness or fatigue, nausea, vomiting, diarrhea, or fever that lasts longer than expected Unusual bruising or bleeding Side effects that usually do not require medical attention (report to your care team if they continue or are bothersome): Diarrhea Fatigue Hair loss Loss of appetite Nausea Vomiting This list  may not describe all possible side effects. Call your doctor for medical advice about side effects. You may report side effects to FDA at 1-800-FDA-1088. Where should I keep my medication? This medication is given in a hospital or clinic. It will not be stored at home. NOTE: This sheet is a summary. It may not cover all possible information. If you have questions about this medicine, talk to your doctor, pharmacist, or health care provider.  2024 Elsevier/Gold Standard (2021-06-26 00:00:00)

## 2023-10-02 LAB — CANCER ANTIGEN 19-9: CA 19-9: 217 U/mL — ABNORMAL HIGH (ref 0–35)

## 2023-10-04 ENCOUNTER — Telehealth: Payer: Self-pay

## 2023-10-04 ENCOUNTER — Other Ambulatory Visit: Payer: Self-pay | Admitting: Nurse Practitioner

## 2023-10-04 ENCOUNTER — Inpatient Hospital Stay

## 2023-10-04 ENCOUNTER — Telehealth: Payer: Self-pay | Admitting: Nurse Practitioner

## 2023-10-04 DIAGNOSIS — C7801 Secondary malignant neoplasm of right lung: Secondary | ICD-10-CM

## 2023-10-04 DIAGNOSIS — C221 Intrahepatic bile duct carcinoma: Secondary | ICD-10-CM

## 2023-10-04 NOTE — Telephone Encounter (Signed)
 Called to check on patient per NP, patient stated she took PRNS and has relief she denies [facility] visit today, slight ongoing cough will call us  if worsening

## 2023-10-04 NOTE — Telephone Encounter (Signed)
 Patient called to cancel injection appointment for today. She said she can not make it . I told her I would let you know and someone would call her if needed.

## 2023-10-05 ENCOUNTER — Encounter: Payer: Self-pay | Admitting: Internal Medicine

## 2023-10-05 ENCOUNTER — Inpatient Hospital Stay

## 2023-10-05 DIAGNOSIS — Z7901 Long term (current) use of anticoagulants: Secondary | ICD-10-CM | POA: Diagnosis not present

## 2023-10-05 DIAGNOSIS — Z86718 Personal history of other venous thrombosis and embolism: Secondary | ICD-10-CM | POA: Diagnosis not present

## 2023-10-05 DIAGNOSIS — C221 Intrahepatic bile duct carcinoma: Secondary | ICD-10-CM | POA: Diagnosis not present

## 2023-10-05 DIAGNOSIS — C7801 Secondary malignant neoplasm of right lung: Secondary | ICD-10-CM

## 2023-10-05 DIAGNOSIS — Z79899 Other long term (current) drug therapy: Secondary | ICD-10-CM | POA: Diagnosis not present

## 2023-10-05 DIAGNOSIS — Z5111 Encounter for antineoplastic chemotherapy: Secondary | ICD-10-CM | POA: Diagnosis not present

## 2023-10-05 MED ORDER — PEGFILGRASTIM-JMDB 6 MG/0.6ML ~~LOC~~ SOSY
6.0000 mg | PREFILLED_SYRINGE | Freq: Once | SUBCUTANEOUS | Status: AC
  Administered 2023-10-05 (×2): 6 mg via SUBCUTANEOUS
  Filled 2023-10-05: qty 0.6

## 2023-10-05 NOTE — Progress Notes (Signed)
 Patient over 72 hr post treatment.  Confirmed with Lauren a, NP to proceed with GCSF today.

## 2023-10-15 ENCOUNTER — Inpatient Hospital Stay: Admitting: Internal Medicine

## 2023-10-15 ENCOUNTER — Encounter: Payer: Self-pay | Admitting: Internal Medicine

## 2023-10-15 ENCOUNTER — Inpatient Hospital Stay

## 2023-10-15 ENCOUNTER — Other Ambulatory Visit

## 2023-10-15 ENCOUNTER — Ambulatory Visit: Admitting: Internal Medicine

## 2023-10-15 ENCOUNTER — Ambulatory Visit

## 2023-10-15 VITALS — BP 136/95 | HR 101 | Temp 98.0°F | Resp 16 | Ht 69.0 in | Wt 226.7 lb

## 2023-10-15 VITALS — BP 133/95 | HR 85

## 2023-10-15 DIAGNOSIS — C7802 Secondary malignant neoplasm of left lung: Secondary | ICD-10-CM

## 2023-10-15 DIAGNOSIS — C7801 Secondary malignant neoplasm of right lung: Secondary | ICD-10-CM

## 2023-10-15 DIAGNOSIS — C221 Intrahepatic bile duct carcinoma: Secondary | ICD-10-CM

## 2023-10-15 DIAGNOSIS — Z79899 Other long term (current) drug therapy: Secondary | ICD-10-CM | POA: Diagnosis not present

## 2023-10-15 DIAGNOSIS — Z86718 Personal history of other venous thrombosis and embolism: Secondary | ICD-10-CM | POA: Diagnosis not present

## 2023-10-15 DIAGNOSIS — I10 Essential (primary) hypertension: Secondary | ICD-10-CM | POA: Diagnosis not present

## 2023-10-15 DIAGNOSIS — Z5111 Encounter for antineoplastic chemotherapy: Secondary | ICD-10-CM | POA: Diagnosis not present

## 2023-10-15 DIAGNOSIS — Z7901 Long term (current) use of anticoagulants: Secondary | ICD-10-CM | POA: Diagnosis not present

## 2023-10-15 LAB — CBC WITH DIFFERENTIAL (CANCER CENTER ONLY)
Abs Immature Granulocytes: 0.09 K/uL — ABNORMAL HIGH (ref 0.00–0.07)
Basophils Absolute: 0 K/uL (ref 0.0–0.1)
Basophils Relative: 0 %
Eosinophils Absolute: 0 K/uL (ref 0.0–0.5)
Eosinophils Relative: 0 %
HCT: 33 % — ABNORMAL LOW (ref 36.0–46.0)
Hemoglobin: 11.1 g/dL — ABNORMAL LOW (ref 12.0–15.0)
Immature Granulocytes: 2 %
Lymphocytes Relative: 16 %
Lymphs Abs: 0.9 K/uL (ref 0.7–4.0)
MCH: 36.9 pg — ABNORMAL HIGH (ref 26.0–34.0)
MCHC: 33.6 g/dL (ref 30.0–36.0)
MCV: 109.6 fL — ABNORMAL HIGH (ref 80.0–100.0)
Monocytes Absolute: 0.3 K/uL (ref 0.1–1.0)
Monocytes Relative: 6 %
Neutro Abs: 4.2 K/uL (ref 1.7–7.7)
Neutrophils Relative %: 76 %
Platelet Count: 130 K/uL — ABNORMAL LOW (ref 150–400)
RBC: 3.01 MIL/uL — ABNORMAL LOW (ref 3.87–5.11)
RDW: 17 % — ABNORMAL HIGH (ref 11.5–15.5)
WBC Count: 5.4 K/uL (ref 4.0–10.5)
nRBC: 0 % (ref 0.0–0.2)

## 2023-10-15 LAB — CMP (CANCER CENTER ONLY)
ALT: 29 U/L (ref 0–44)
AST: 35 U/L (ref 15–41)
Albumin: 3.7 g/dL (ref 3.5–5.0)
Alkaline Phosphatase: 140 U/L — ABNORMAL HIGH (ref 38–126)
Anion gap: 9 (ref 5–15)
BUN: 10 mg/dL (ref 6–20)
CO2: 23 mmol/L (ref 22–32)
Calcium: 8.7 mg/dL — ABNORMAL LOW (ref 8.9–10.3)
Chloride: 106 mmol/L (ref 98–111)
Creatinine: 0.75 mg/dL (ref 0.44–1.00)
GFR, Estimated: >60 mL/min (ref >60)
Glucose, Bld: 100 mg/dL — ABNORMAL HIGH (ref 70–99)
Potassium: 3.8 mmol/L (ref 3.5–5.1)
Sodium: 138 mmol/L (ref 135–145)
Total Bilirubin: 0.6 mg/dL (ref 0.0–1.2)
Total Protein: 7 g/dL (ref 6.5–8.1)

## 2023-10-15 MED ORDER — SODIUM CHLORIDE 0.9 % IV SOLN
INTRAVENOUS | Status: DC
  Filled 2023-10-15: qty 250

## 2023-10-15 MED ORDER — AMLODIPINE BESYLATE 5 MG PO TABS: 5.0000 mg | ORAL_TABLET | Freq: Every day | ORAL | 1 refills | Status: DC

## 2023-10-15 MED ORDER — BENZONATATE 100 MG PO CAPS: 100.0000 mg | ORAL_CAPSULE | Freq: Three times a day (TID) | ORAL | 0 refills | Status: AC | PRN

## 2023-10-15 MED ORDER — PACLITAXEL PROTEIN-BOUND CHEMO INJECTION 100 MG
125.0000 mg/m2 | Freq: Once | INTRAVENOUS | Status: AC
  Administered 2023-10-15: 300 mg via INTRAVENOUS
  Filled 2023-10-15: qty 60

## 2023-10-15 MED ORDER — SODIUM CHLORIDE 0.9 % IV SOLN
1000.0000 mg/m2 | Freq: Once | INTRAVENOUS | Status: AC
  Administered 2023-10-15: 2242 mg via INTRAVENOUS
  Filled 2023-10-15: qty 58.97

## 2023-10-15 MED ORDER — PROCHLORPERAZINE MALEATE 10 MG PO TABS
10.0000 mg | ORAL_TABLET | Freq: Once | ORAL | Status: AC
  Administered 2023-10-15: 10 mg via ORAL
  Filled 2023-10-15: qty 1

## 2023-10-15 MED ORDER — SODIUM CHLORIDE 0.9% FLUSH
10.0000 mL | INTRAVENOUS | Status: DC | PRN
  Filled 2023-10-15: qty 10

## 2023-10-15 NOTE — Patient Instructions (Signed)
 [facility] - A DEPT OF [facility]  Discharge Instructions: Thank you for choosing [facility] to provide your oncology and hematology care.  If you have a lab appointment with the [facility], please go directly to the [facility] and check in at the registration area.  Wear comfortable clothing and clothing appropriate for easy access to any Portacath or PICC line.   We strive to give you quality time with your provider. You may need to reschedule your appointment if you arrive late (15 or more minutes).  Arriving late affects you and other patients whose appointments are after yours.  Also, if you miss three or more appointments without notifying the office, you may be dismissed from the clinic at the provider's discretion.      For prescription refill requests, have your pharmacy contact our office and allow 72 hours for refills to be completed.    Today you received the following chemotherapy and/or immunotherapy agents: PACLitaxel -protein / gemcitabine    To help prevent nausea and vomiting after your treatment, we encourage you to take your nausea medication as directed.  BELOW ARE SYMPTOMS THAT SHOULD BE REPORTED IMMEDIATELY: *FEVER GREATER THAN 100.4 F (38 C) OR HIGHER *CHILLS OR SWEATING *NAUSEA AND VOMITING THAT IS NOT CONTROLLED WITH YOUR NAUSEA MEDICATION *UNUSUAL SHORTNESS OF BREATH *UNUSUAL BRUISING OR BLEEDING *URINARY PROBLEMS (pain or burning when urinating, or frequent urination) *BOWEL PROBLEMS (unusual diarrhea, constipation, pain near the anus) TENDERNESS IN MOUTH AND THROAT WITH OR WITHOUT PRESENCE OF ULCERS (sore throat, sores in mouth, or a toothache) UNUSUAL RASH, SWELLING OR PAIN  UNUSUAL VAGINAL DISCHARGE OR ITCHING   Items with * indicate a potential emergency and should be followed up as soon as possible or go to the Emergency Department if any problems should occur.  Please show the CHEMOTHERAPY ALERT  CARD or IMMUNOTHERAPY ALERT CARD at check-in to the Emergency Department and triage nurse.  Should you have questions after your visit or need to cancel or reschedule your appointment, please contact [facility] - A DEPT OF [facility]  [phone] and follow the prompts.  Office hours are 8:00 a.m. to 4:30 p.m. Monday - Friday. Please note that voicemails left after 4:00 p.m. may not be returned until the following business day.  We are closed weekends and major holidays. You have access to a nurse at all times for urgent questions. Please call the main number to the clinic [phone] and follow the prompts.  For any non-urgent questions, you may also contact your provider using MyChart. We now offer e-Visits for anyone 41 and older to request care online for non-urgent symptoms. For details visit [email or web address].   Also download the MyChart app! Go to the app store, search MyChart, open the app, select [facility], and log in with your MyChart username and password.

## 2023-10-15 NOTE — Progress Notes (Signed)
 [facility] CONSULT NOTE  Patient Care Team: Justus Leita DEL, MD as PCP - General (Internal Medicine) Verdene Gills, RN as Oncology Nurse Navigator Rennie Cindy SAUNDERS, MD as Consulting Physician (Oncology)  CHIEF COMPLAINTS/PURPOSE OF CONSULTATION: cholangiocarcinoma  Oncology History Overview Note   Cisplatin  25 mg/m/gemcitabine  1000 mg/m/Durvalumab  -completed 8 cycles on 03/04/2023 Maintenance Durvalumab /gemcitabine -started 03/26/2023   ASSESSMENT & PLAN:  Vicki Phillips 58 y.o. 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   # Intrahepatic Cholangiocarcinoma, stage IV - s/p liver biopsy on 08/26/2022 showed metastatic moderately differentiated adenocarcinoma.  IHC stain positive for CK7 and CK20.  TTF-1, PAX8, GATA3 and CDX2 are negative.  Differentials include cholangiocarcinoma and pancreatic carcinoma.    -PD-L1 0%.  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   -s/p cisplatin , gemcitabine  and Durvalumab  every 3 weeks for 8 cycles.  Then on maintenance Durvalumab  and gemcitabine  until March 2025. -With rising CA 19-9, CT chest abdomen pelvis was done which unfortunately showed progression of the lung nodules.   - Based on FGFR 2 gene rearrangement, plan was to start futibatinib  16 mg.  Patient decided to start at full dose 20 mg on 06/04/2023.  Phosphorus is elevated to 7.5.  Dosing guidelines reviewed.  Will decrease dose to 16 mg once daily.  Weekly phosphorus level check.  Started on PhosLo  2 tabs with every meals.  If phosphorus level goes below 7 in the next 2 weeks, we will continue with futibatinib  16 mg once daily.   # JUNE 2025- HOLD futibatinib  sec to myalgias/arthralgias/ocular symptoms  # # JULY 14th, 2025-  Innumerable metastatic lung nodules with overall increase in the size of the nodules; Ill-defined mass in the left lobe of the liver with increase  in the calcifications-given the progressive disease recommend gemcitabine  Abraxane  chemotherapy [steroid sparing].   # JULY 25th, 2025- Proceed with cycle #1-gemcitabine  Abraxane  every 2 weeks;  # MAY 19th, 2025- LEFT DVT- on eliquis  [name] ]     Intrahepatic cholangiocarcinoma (HCC)  08/26/2022 Cancer Staging   Staging form: Intrahepatic Bile Duct, AJCC 8th Edition - Clinical stage from 08/26/2022: Stage IV (cM1) - Signed by Clista Bimler, MD on 09/03/2022 Stage prefix: Initial diagnosis   09/03/2022 Initial Diagnosis   Cholangiocarcinoma (HCC)   09/18/2022 - 04/30/2023 Chemotherapy   Patient is on Treatment Plan : BILIARY TRACT Cisplatin  + Gemcitabine  D1,8 + Durvalumab  (1500) D1 q21d / Durvalumab  (1500) q28d     09/17/2023 -  Chemotherapy   Patient is on Treatment Plan : PANCREATIC Abraxane  D1,8,15 + Gemcitabine  D1,8,15 q28d     Metastasis to lung (HCC)  09/03/2022 Initial Diagnosis   Metastasis to lung (HCC)   09/18/2022 - 04/30/2023 Chemotherapy   Patient is on Treatment Plan : BILIARY TRACT Cisplatin  + Gemcitabine  D1,8 + Durvalumab  (1500) D1 q21d / Durvalumab  (1500) q28d     09/17/2023 -  Chemotherapy   Patient is on Treatment Plan : PANCREATIC Abraxane  D1,8,15 + Gemcitabine  D1,8,15 q28d      HISTORY OF PRESENTING ILLNESS: Patient ambulating-independently. Accompanied by family- son.    Vicki Phillips 58 y.o.  female pleasant patient with a history of intrahepatic stage IV cholangiocarcinoma with metastasis to lungs is currently OFF- FGFR inhibitor [poor tolerance]- MAY 2025-  DVT of the left lower extremity-  Eliquis  is here for a follow up.  Patient still has mild chronic cough.  Not resolved.  Mild shortness of breath on exertion.  No  fevers no chills currently.  Denies any worsening leg swelling.  Continues  to be  on eliquis .    No palpitations. Denies any chest pains.   Review of Systems  Constitutional:  Positive for malaise/fatigue. Negative for chills,  diaphoresis, fever and weight loss.  HENT:  Negative for nosebleeds and sore throat.   Eyes:  Negative for double vision.  Respiratory:  Negative for cough, hemoptysis, sputum production, shortness of breath and wheezing.   Cardiovascular:  Positive for leg swelling. Negative for chest pain, palpitations and orthopnea.  Gastrointestinal:  Negative for abdominal pain, blood in stool, constipation, diarrhea, heartburn, melena, nausea and vomiting.  Genitourinary:  Negative for dysuria, frequency and urgency.  Musculoskeletal:  Positive for back pain and joint pain.  Skin: Negative.  Negative for itching and rash.  Neurological:  Negative for dizziness, tingling, focal weakness, weakness and headaches.  Endo/Heme/Allergies:  Does not bruise/bleed easily.  Psychiatric/Behavioral:  Negative for depression. The patient is not nervous/anxious and does not have insomnia.     MEDICAL HISTORY:  Past Medical History:  Diagnosis Date   Allergy    Bile duct cancer (HCC)    Sarcoidosis 2011    SURGICAL HISTORY: Past Surgical History:  Procedure Laterality Date   CESAREAN SECTION     x 2   COLONOSCOPY WITH PROPOFOL  N/A 08/10/2019   Procedure: COLONOSCOPY WITH PROPOFOL ;  Surgeon: Jinny Carmine, MD;  Location: [facility];  Service: Endoscopy;  Laterality: N/A;  priority 4   IR IMAGING GUIDED PORT INSERTION  09/11/2022   LUNG BIOPSY      SOCIAL HISTORY: Social History   Socioeconomic History   Marital status: Single    Spouse name: Not on file   Number of children: 2   Years of education: Not on file   Highest education level: Not on file  Occupational History   Not on file  Tobacco Use   Smoking status: Never   Smokeless tobacco: Never  Vaping Use   Vaping status: Never Used  Substance and Sexual Activity   Alcohol use: Not Currently    Alcohol/week: 4.0 standard drinks of alcohol    Types: 4 Glasses of wine per week   Drug use: No   Sexual activity: Yes    Partners: Male     Birth control/protection: Surgical  Other Topics Concern   Not on file  Social History Narrative   Not on file   Social Drivers of Health   Financial Resource Strain: Low Risk  (09/18/2022)   Overall Financial Resource Strain (CARDIA)    Difficulty of Paying Living Expenses: Not hard at all  Food Insecurity: No Food Insecurity (08/24/2022)   Hunger Vital Sign    Worried About Running Out of Food in the Last Year: Never true    Ran Out of Food in the Last Year: Never true  Transportation Needs: No Transportation Needs (08/24/2022)   PRAPARE - Administrator, Civil Service (Medical): No    Lack of Transportation (Non-Medical): No  Physical Activity: Not on file  Stress: Not on file  Social Connections: Not on file  Intimate Partner Violence: Not At Risk (08/24/2022)   Humiliation, Afraid, Rape, and Kick questionnaire    Fear of Current or Ex-Partner: No    Emotionally Abused: No    Physically Abused: No    Sexually Abused: No    FAMILY HISTORY: Family History  Problem Relation Age of Onset   Stroke Mother    Heart disease  Mother    Heart attack Father 62   Heart disease Father    Kidney disease Sister     ALLERGIES:  is allergic to other.  MEDICATIONS:  Current Outpatient Medications  Medication Sig Dispense Refill   apixaban  (ELIQUIS ) 5 MG TABS tablet Take 1 tablet (5 mg total) by mouth 2 (two) times daily. 60 tablet 5   benzonatate  (TESSALON ) 100 MG capsule Take 1 capsule (100 mg total) by mouth 3 (three) times daily as needed for cough. 60 capsule 0   diphenoxylate -atropine  (LOMOTIL ) 2.5-0.025 MG tablet Take 1 tablet by mouth 4 (four) times daily as needed for diarrhea or loose stools. Take it along with immodium 60 tablet 0   loratadine (CLARITIN) 10 MG tablet Take 10 mg by mouth daily.     OLANZapine  (ZYPREXA ) 10 MG tablet TAKE 1 TABLET(10 MG) BY MOUTH AT BEDTIME 30 tablet 0   ondansetron  (ZOFRAN ) 8 MG tablet Take 1 tablet (8 mg total) by mouth every 8  (eight) hours as needed for nausea or vomiting. 90 tablet 0   prochlorperazine  (COMPAZINE ) 10 MG tablet Take 1 tablet (10 mg total) by mouth every 6 (six) hours as needed for nausea or vomiting. 30 tablet 2   amLODipine  (NORVASC ) 5 MG tablet Take 1 tablet (5 mg total) by mouth daily. 90 tablet 1   No current facility-administered medications for this visit.   Facility-Administered Medications Ordered in Other Visits  Medication Dose Route Frequency Provider Last Rate Last Admin   0.9 %  sodium chloride  infusion   Intravenous Continuous Jarita Raval R, MD 10 mL/hr at 10/15/23 0953 New Bag at 10/15/23 0953   gemcitabine  (GEMZAR ) 2,242 mg in sodium chloride  0.9 % 250 mL chemo infusion  1,000 mg/m2 (Treatment Plan Recorded) Intravenous Once Socorro Kanitz R, MD       PACLitaxel -protein bound (ABRAXANE ) chemo infusion 300 mg  125 mg/m2 (Treatment Plan Recorded) Intravenous Once Leala Bryand R, MD       sodium chloride  flush (NS) 0.9 % injection 10 mL  10 mL Intracatheter PRN Rennie Cindy SAUNDERS, MD        PHYSICAL EXAMINATION:   Vitals:   10/15/23 0846 10/15/23 0911  BP: (!) 122/101 (!) 136/95  Pulse: (!) 101   Resp: 16   Temp: 98 F (36.7 C)   SpO2: 98%     Filed Weights   10/15/23 0846  Weight: 226 lb 11.2 oz (102.8 kg)    LEFT LOWER extremity- Swelling and tenderness.   Physical Exam Vitals and nursing note reviewed.  HENT:     Head: Normocephalic and atraumatic.     Mouth/Throat:     Pharynx: Oropharynx is clear.  Eyes:     Extraocular Movements: Extraocular movements intact.     Pupils: Pupils are equal, round, and reactive to light.  Cardiovascular:     Rate and Rhythm: Normal rate and regular rhythm.  Pulmonary:     Comments: Decreased breath sounds bilaterally.  Abdominal:     Palpations: Abdomen is soft.  Musculoskeletal:        General: Normal range of motion.     Cervical back: Normal range of motion.  Skin:    General: Skin is warm.   Neurological:     General: No focal deficit present.     Mental Status: She is alert and oriented to person, place, and time.  Psychiatric:        Behavior: Behavior normal.        Judgment:  Judgment normal.     LABORATORY DATA:  I have reviewed the data as listed Lab Results  Component Value Date   WBC 5.4 10/15/2023   HGB 11.1 (L) 10/15/2023   HCT 33.0 (L) 10/15/2023   MCV 109.6 (H) 10/15/2023   PLT 130 (L) 10/15/2023   Recent Labs    09/17/23 0849 10/01/23 0834 10/15/23 0849  NA 138 136 138  K 3.6 4.2 3.8  CL 106 105 106  CO2 23 23 23   GLUCOSE 96 107* 100*  BUN 9 9 10   CREATININE 0.66 0.69 0.75  CALCIUM  8.7* 8.9 8.7*  GFRNONAA >60 >60 >60  PROT 6.8 7.0 7.0  ALBUMIN 3.6 3.6 3.7  AST 66* 58* 35  ALT 42 40 29  ALKPHOS 117 135* 140*  BILITOT 0.7 0.5 0.6    RADIOGRAPHIC STUDIES: I have personally reviewed the radiological images as listed and agreed with the findings in the report. No results found.    Intrahepatic cholangiocarcinoma (HCC) # Intrahepatic Cholangiocarcinoma, stage IV- - s/p liver biopsy on 08/26/2022 showed metastatic moderately differentiated adenocarcinoma.  IHC stain positive for CK7 and CK20.  TTF-1, PAX8, GATA3 and CDX2 are negative.  Differentials include cholangiocarcinoma and pancreatic carcinoma. -PD-L1 0%.  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 stage IV intrahepatic cholangiocarcinoma-   # JULY 14th, 2025-  Innumerable metastatic lung nodules with overall increase in the size of the nodules; Ill-defined mass in the left lobe of the liver with increase in the calcifications-given the progressive disease recommend gemcitabine  Abraxane  chemotherapy [steroid sparing].   # JULY 25th, 2025- START GEM-ABRAXANE  q 2 W- with GCSF  # Proceed with cycle # 3-gemcitabine  Abraxane  every 2 weeks; Labs-CBC/chemistries were reviewed with the patient.  # MAY 19th, 2025-  DVT of the left lower extremity-  Eliquis  5 mg twice daily for  6 months; and then recommend 2.5 mg twice daily indefinitely given her underlying cholangiocarcinoma. Stable.   # Nausea/poor appetite- : recommend zofran /compazine  [anxiety]; recommend commplaince with zyprexa - 10 mg at bedtime- Stable.   # Diarrhea: G-1- -Chronic.  Imodium/Lomotil  as needed- Stable.   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 -Improving since off steroid.  Will monitor for now.  # Hypertension: -Continue with amlodipine  5 mg daily. Stable; refilled.   # Cough: -ongoing- ? Acute bronchitis- resolving- Continue with Tussionex as needed.  Declines any refills at this time. HOLD CXR- called in tessalon  pearls    # History of sarcoidosis- -Management per pulmonary; -Never required treatment.  # IV access; port flush q 2 M  # JRE:inwz-  PS-  # DISPOSITION:  # chemo today; D-3 injection # in 2  week-MD labs CBC CMP; CA 19-9; chemo; ; D-3 injection # follow up  in 4  week-MD labs CBC CMP; CA 19-9; chemo - ; D-3 injection -Dr.B    Above plan of care was discussed with patient/family in detail.  My contact information was given to the patient/family.      Cindy JONELLE Joe, MD 10/15/2023 9:55 AM

## 2023-10-15 NOTE — Assessment & Plan Note (Addendum)
#   Intrahepatic Cholangiocarcinoma, stage IV- - s/p liver biopsy on 08/26/2022 showed metastatic moderately differentiated adenocarcinoma.  IHC stain positive for CK7 and CK20.  TTF-1, PAX8, GATA3 and CDX2 are negative.  Differentials include cholangiocarcinoma and pancreatic carcinoma. -PD-L1 0%.  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 stage IV intrahepatic cholangiocarcinoma-   # JULY 14th, 2025-  Innumerable metastatic lung nodules with overall increase in the size of the nodules; Ill-defined mass in the left lobe of the liver with increase in the calcifications-given the progressive disease recommend gemcitabine  Abraxane  chemotherapy [steroid sparing].   # JULY 25th, 2025- START GEM-ABRAXANE  q 2 W- with GCSF  # Proceed with cycle # 3-gemcitabine  Abraxane  every 2 weeks; Labs-CBC/chemistries were reviewed with the patient.  # MAY 19th, 2025-  DVT of the left lower extremity-  Eliquis  5 mg twice daily for 6 months; and then recommend 2.5 mg twice daily indefinitely given her underlying cholangiocarcinoma. Stable.   # Nausea/poor appetite- : recommend zofran /compazine  [anxiety]; recommend commplaince with zyprexa - 10 mg at bedtime- Stable.   # Diarrhea: G-1- -Chronic.  Imodium/Lomotil  as needed- Stable.   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 -Improving since off steroid.  Will monitor for now.  # Hypertension: -Continue with amlodipine  5 mg daily. Stable; refilled.   # Cough: -ongoing- ? Acute bronchitis- resolving- Continue with Tussionex as needed.  Declines any refills at this time. HOLD CXR- called in tessalon  pearls    # History of sarcoidosis- -Management per pulmonary; -Never required treatment.  # IV access; port flush q 2 M  # JRE:inwz-  PS-  # DISPOSITION:  # chemo today; D-3 injection # in 2  week-MD labs CBC CMP; CA 19-9; chemo; ; D-3 injection # follow up  in 4  week-MD labs CBC CMP; CA 19-9; chemo - ; D-3  injection -Dr.B

## 2023-10-15 NOTE — Progress Notes (Signed)
 C/o still having sob and cough is getting worse. Having congestion, sinus stuffiness. Asking if she may need antibiotic?  Needs refill amlodipine , pended. Been out for 3 weeks. PCP retired and hasn't found a new one.

## 2023-10-16 LAB — CANCER ANTIGEN 19-9: CA 19-9: 151 U/mL — ABNORMAL HIGH (ref 0–35)

## 2023-10-18 ENCOUNTER — Inpatient Hospital Stay

## 2023-10-18 DIAGNOSIS — C7801 Secondary malignant neoplasm of right lung: Secondary | ICD-10-CM

## 2023-10-18 DIAGNOSIS — Z86718 Personal history of other venous thrombosis and embolism: Secondary | ICD-10-CM | POA: Diagnosis not present

## 2023-10-18 DIAGNOSIS — C221 Intrahepatic bile duct carcinoma: Secondary | ICD-10-CM

## 2023-10-18 DIAGNOSIS — Z79899 Other long term (current) drug therapy: Secondary | ICD-10-CM | POA: Diagnosis not present

## 2023-10-18 DIAGNOSIS — Z5111 Encounter for antineoplastic chemotherapy: Secondary | ICD-10-CM | POA: Diagnosis not present

## 2023-10-18 DIAGNOSIS — Z7901 Long term (current) use of anticoagulants: Secondary | ICD-10-CM | POA: Diagnosis not present

## 2023-10-18 MED ORDER — PEGFILGRASTIM-JMDB 6 MG/0.6ML ~~LOC~~ SOSY
6.0000 mg | PREFILLED_SYRINGE | Freq: Once | SUBCUTANEOUS | Status: AC
  Administered 2023-10-18: 6 mg via SUBCUTANEOUS
  Filled 2023-10-18: qty 0.6

## 2023-10-27 ENCOUNTER — Other Ambulatory Visit: Payer: Self-pay | Admitting: *Deleted

## 2023-10-27 ENCOUNTER — Encounter: Payer: Self-pay | Admitting: Internal Medicine

## 2023-10-27 MED ORDER — OLANZAPINE 10 MG PO TABS: 10.0000 mg | ORAL_TABLET | Freq: Every day | ORAL | 0 refills | Status: DC

## 2023-10-28 ENCOUNTER — Telehealth: Payer: Self-pay

## 2023-10-28 NOTE — Telephone Encounter (Signed)
 Patient sends in [employer] claim paperwork via MyChart message on 10/27/23 attached to message: STD is running out on 10/22 and I need my long-term disability paperwok completed please.

## 2023-10-28 NOTE — Telephone Encounter (Signed)
Completed paperwork faxed 

## 2023-10-29 ENCOUNTER — Ambulatory Visit

## 2023-10-29 ENCOUNTER — Other Ambulatory Visit

## 2023-10-29 ENCOUNTER — Ambulatory Visit: Admitting: Internal Medicine

## 2023-11-01 ENCOUNTER — Ambulatory Visit

## 2023-11-01 ENCOUNTER — Inpatient Hospital Stay (HOSPITAL_BASED_OUTPATIENT_CLINIC_OR_DEPARTMENT_OTHER): Admitting: Nurse Practitioner

## 2023-11-01 ENCOUNTER — Inpatient Hospital Stay: Attending: Internal Medicine

## 2023-11-01 ENCOUNTER — Inpatient Hospital Stay

## 2023-11-01 VITALS — BP 129/93 | HR 97 | Temp 96.9°F | Resp 18 | Ht 69.0 in | Wt 226.0 lb

## 2023-11-01 VITALS — BP 128/97 | HR 93

## 2023-11-01 DIAGNOSIS — D869 Sarcoidosis, unspecified: Secondary | ICD-10-CM | POA: Diagnosis not present

## 2023-11-01 DIAGNOSIS — C7801 Secondary malignant neoplasm of right lung: Secondary | ICD-10-CM

## 2023-11-01 DIAGNOSIS — Z79899 Other long term (current) drug therapy: Secondary | ICD-10-CM | POA: Diagnosis not present

## 2023-11-01 DIAGNOSIS — Z5111 Encounter for antineoplastic chemotherapy: Secondary | ICD-10-CM

## 2023-11-01 DIAGNOSIS — Z7901 Long term (current) use of anticoagulants: Secondary | ICD-10-CM | POA: Diagnosis not present

## 2023-11-01 DIAGNOSIS — C7800 Secondary malignant neoplasm of unspecified lung: Secondary | ICD-10-CM | POA: Diagnosis not present

## 2023-11-01 DIAGNOSIS — C221 Intrahepatic bile duct carcinoma: Secondary | ICD-10-CM

## 2023-11-01 DIAGNOSIS — Z86718 Personal history of other venous thrombosis and embolism: Secondary | ICD-10-CM | POA: Diagnosis not present

## 2023-11-01 DIAGNOSIS — C7802 Secondary malignant neoplasm of left lung: Secondary | ICD-10-CM

## 2023-11-01 LAB — CMP (CANCER CENTER ONLY)
ALT: 26 U/L (ref 0–44)
AST: 35 U/L (ref 15–41)
Albumin: 4 g/dL (ref 3.5–5.0)
Alkaline Phosphatase: 127 U/L — ABNORMAL HIGH (ref 38–126)
Anion gap: 11 (ref 5–15)
BUN: 17 mg/dL (ref 6–20)
CO2: 22 mmol/L (ref 22–32)
Calcium: 9 mg/dL (ref 8.9–10.3)
Chloride: 105 mmol/L (ref 98–111)
Creatinine: 0.77 mg/dL (ref 0.44–1.00)
GFR, Estimated: >60 mL/min (ref >60)
Glucose, Bld: 109 mg/dL — ABNORMAL HIGH (ref 70–99)
Potassium: 4.1 mmol/L (ref 3.5–5.1)
Sodium: 138 mmol/L (ref 135–145)
Total Bilirubin: 0.6 mg/dL (ref 0.0–1.2)
Total Protein: 7.2 g/dL (ref 6.5–8.1)

## 2023-11-01 LAB — CBC WITH DIFFERENTIAL (CANCER CENTER ONLY)
Abs Immature Granulocytes: 0.03 K/uL (ref 0.00–0.07)
Basophils Absolute: 0 K/uL (ref 0.0–0.1)
Basophils Relative: 0 %
Eosinophils Absolute: 0 K/uL (ref 0.0–0.5)
Eosinophils Relative: 1 %
HCT: 33.2 % — ABNORMAL LOW (ref 36.0–46.0)
Hemoglobin: 11.1 g/dL — ABNORMAL LOW (ref 12.0–15.0)
Immature Granulocytes: 1 %
Lymphocytes Relative: 15 %
Lymphs Abs: 0.7 K/uL (ref 0.7–4.0)
MCH: 37.8 pg — ABNORMAL HIGH (ref 26.0–34.0)
MCHC: 33.4 g/dL (ref 30.0–36.0)
MCV: 112.9 fL — ABNORMAL HIGH (ref 80.0–100.0)
Monocytes Absolute: 0.3 K/uL (ref 0.1–1.0)
Monocytes Relative: 6 %
Neutro Abs: 3.7 K/uL (ref 1.7–7.7)
Neutrophils Relative %: 77 %
Platelet Count: 279 K/uL (ref 150–400)
RBC: 2.94 MIL/uL — ABNORMAL LOW (ref 3.87–5.11)
RDW: 18.7 % — ABNORMAL HIGH (ref 11.5–15.5)
WBC Count: 4.8 K/uL (ref 4.0–10.5)
nRBC: 0 % (ref 0.0–0.2)

## 2023-11-01 LAB — PHOSPHORUS: Phosphorus: 4 mg/dL (ref 2.5–4.6)

## 2023-11-01 MED ORDER — SODIUM CHLORIDE 0.9 % IV SOLN
1000.0000 mg/m2 | Freq: Once | INTRAVENOUS | Status: AC
  Administered 2023-11-01: 2242 mg via INTRAVENOUS
  Filled 2023-11-01: qty 58.97

## 2023-11-01 MED ORDER — PACLITAXEL PROTEIN-BOUND CHEMO INJECTION 100 MG
125.0000 mg/m2 | Freq: Once | INTRAVENOUS | Status: AC
  Administered 2023-11-01: 300 mg via INTRAVENOUS
  Filled 2023-11-01: qty 60

## 2023-11-01 MED ORDER — SODIUM CHLORIDE 0.9 % IV SOLN
INTRAVENOUS | Status: DC
  Filled 2023-11-01: qty 250

## 2023-11-01 MED ORDER — PROCHLORPERAZINE MALEATE 10 MG PO TABS
10.0000 mg | ORAL_TABLET | Freq: Once | ORAL | Status: AC
  Administered 2023-11-01: 10 mg via ORAL
  Filled 2023-11-01: qty 1

## 2023-11-01 NOTE — Progress Notes (Signed)
 [facility] CONSULT NOTE  Patient Care Team: Justus Leita DEL, MD as PCP - General (Internal Medicine) Verdene Gills, RN as Oncology Nurse Navigator Rennie Cindy SAUNDERS, MD as Consulting Physician (Oncology)  CHIEF COMPLAINTS/PURPOSE OF CONSULTATION: cholangiocarcinoma  Oncology History Overview Note   Cisplatin  25 mg/m/gemcitabine  1000 mg/m/Durvalumab  -completed 8 cycles on 03/04/2023 Maintenance Durvalumab /gemcitabine -started 03/26/2023   ASSESSMENT & PLAN:  Vicki Phillips 58 y.o. 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   # Intrahepatic Cholangiocarcinoma, stage IV - s/p liver biopsy on 08/26/2022 showed metastatic moderately differentiated adenocarcinoma.  IHC stain positive for CK7 and CK20.  TTF-1, PAX8, GATA3 and CDX2 are negative.  Differentials include cholangiocarcinoma and pancreatic carcinoma.    -PD-L1 0%.  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   -s/p cisplatin , gemcitabine  and Durvalumab  every 3 weeks for 8 cycles.  Then on maintenance Durvalumab  and gemcitabine  until March 2025. -With rising CA 19-9, CT chest abdomen pelvis was done which unfortunately showed progression of the lung nodules.   - Based on FGFR 2 gene rearrangement, plan was to start futibatinib  16 mg.  Patient decided to start at full dose 20 mg on 06/04/2023.  Phosphorus is elevated to 7.5.  Dosing guidelines reviewed.  Will decrease dose to 16 mg once daily.  Weekly phosphorus level check.  Started on PhosLo  2 tabs with every meals.  If phosphorus level goes below 7 in the next 2 weeks, we will continue with futibatinib  16 mg once daily.   # JUNE 2025- HOLD futibatinib  sec to myalgias/arthralgias/ocular symptoms  # # JULY 14th, 2025-  Innumerable metastatic lung nodules with overall increase in the size of the nodules; Ill-defined mass in the left lobe of the liver with increase  in the calcifications-given the progressive disease recommend gemcitabine  Abraxane  chemotherapy [steroid sparing].   # JULY 25th, 2025- Proceed with cycle #1-gemcitabine  Abraxane  every 2 weeks;  # MAY 19th, 2025- LEFT DVT- on eliquis  [name] ]     Intrahepatic cholangiocarcinoma (HCC)  08/26/2022 Cancer Staging   Staging form: Intrahepatic Bile Duct, AJCC 8th Edition - Clinical stage from 08/26/2022: Stage IV (cM1) - Signed by Clista Bimler, MD on 09/03/2022 Stage prefix: Initial diagnosis   09/03/2022 Initial Diagnosis   Cholangiocarcinoma (HCC)   09/18/2022 - 04/30/2023 Chemotherapy   Patient is on Treatment Plan : BILIARY TRACT Cisplatin  + Gemcitabine  D1,8 + Durvalumab  (1500) D1 q21d / Durvalumab  (1500) q28d     09/17/2023 -  Chemotherapy   Patient is on Treatment Plan : PANCREATIC Abraxane  D1,8,15 + Gemcitabine  D1,8,15 q28d     Metastasis to lung (HCC)  09/03/2022 Initial Diagnosis   Metastasis to lung (HCC)   09/18/2022 - 04/30/2023 Chemotherapy   Patient is on Treatment Plan : BILIARY TRACT Cisplatin  + Gemcitabine  D1,8 + Durvalumab  (1500) D1 q21d / Durvalumab  (1500) q28d     09/17/2023 -  Chemotherapy   Patient is on Treatment Plan : PANCREATIC Abraxane  D1,8,15 + Gemcitabine  D1,8,15 q28d      HISTORY OF PRESENTING ILLNESS: Patient ambulating-independently. Accompanied by family- son.    Vicki Phillips 58 y.o.  female pleasant patient with a history of intrahepatic stage IV cholangiocarcinoma with metastasis to lungs is currently OFF- FGFR inhibitor [poor tolerance]- MAY 2025-  DVT of the left lower extremity-  Eliquis  is here for consideration of chemotherapy.   She has mild sinus congestion and cough which is stable and thought to be related to allergies.  She continues eliquis . No shortness of breath, fevers, or chills. Denies palpitations. Feels fatigued for approximately a week after her chemo then improves. Complains of joint aching.   Review of Systems  Constitutional:   Positive for malaise/fatigue. Negative for chills, diaphoresis, fever and weight loss.  HENT:  Negative for nosebleeds and sore throat.   Eyes:  Negative for double vision.  Respiratory:  Negative for cough, hemoptysis, sputum production, shortness of breath and wheezing.   Cardiovascular:  Positive for leg swelling. Negative for chest pain, palpitations and orthopnea.  Gastrointestinal:  Negative for abdominal pain, blood in stool, constipation, diarrhea, heartburn, melena, nausea and vomiting.  Genitourinary:  Negative for dysuria, frequency and urgency.  Musculoskeletal:  Positive for back pain and joint pain.  Skin: Negative.  Negative for itching and rash.  Neurological:  Negative for dizziness, tingling, focal weakness, weakness and headaches.  Endo/Heme/Allergies:  Does not bruise/bleed easily.  Psychiatric/Behavioral:  Negative for depression. The patient is not nervous/anxious and does not have insomnia.    MEDICAL HISTORY:  Past Medical History:  Diagnosis Date   Allergy    Bile duct cancer (HCC)    Sarcoidosis 2011   SURGICAL HISTORY: Past Surgical History:  Procedure Laterality Date   CESAREAN SECTION     x 2   COLONOSCOPY WITH PROPOFOL  N/A 08/10/2019   Procedure: COLONOSCOPY WITH PROPOFOL ;  Surgeon: Jinny Carmine, MD;  Location[facility];  Service: Endoscopy;  Laterality: N/A;  priority 4   IR IMAGING GUIDED PORT INSERTION  09/11/2022   LUNG BIOPSY     SOCIAL HISTORY: Social History   Socioeconomic History   Marital status: Single    Spouse name: Not on file   Number of children: 2   Years of education: Not on file   Highest education level: Not on file  Occupational History   Not on file  Tobacco Use   Smoking status: Never   Smokeless tobacco: Never  Vaping Use   Vaping status: Never Used  Substance and Sexual Activity   Alcohol use: Not Currently    Alcohol/week: 4.0 standard drinks of alcohol    Types: 4 Glasses of wine per week   Drug use:  No   Sexual activity: Yes    Partners: Male    Birth control/protection: Surgical  Other Topics Concern   Not on file  Social History Narrative   Not on file   Social Drivers of Health   Financial Resource Strain: Low Risk  (09/18/2022)   Overall Financial Resource Strain (CARDIA)    Difficulty of Paying Living Expenses: Not hard at all  Food Insecurity: No Food Insecurity (08/24/2022)   Hunger Vital Sign    Worried About Running Out of Food in the Last Year: Never true    Ran Out of Food in the Last Year: Never true  Transportation Needs: No Transportation Needs (08/24/2022)   PRAPARE - Administrator, Civil Service (Medical): No    Lack of Transportation (Non-Medical): No  Physical Activity: Not on file  Stress: Not on file  Social Connections: Not on file  Intimate Partner Violence: Not At Risk (08/24/2022)   Humiliation, Afraid, Rape, and Kick questionnaire    Fear of Current or Ex-Partner: No    Emotionally Abused: No    Physically Abused: No    Sexually Abused: No   FAMILY HISTORY: Family History  Problem Relation Age of Onset   Stroke Mother    Heart disease Mother  Heart attack Father 50   Heart disease Father    Kidney disease Sister    ALLERGIES:  is allergic to other.  MEDICATIONS:  Current Outpatient Medications  Medication Sig Dispense Refill   amLODipine  (NORVASC ) 5 MG tablet Take 1 tablet (5 mg total) by mouth daily. 90 tablet 1   apixaban  (ELIQUIS ) 5 MG TABS tablet Take 1 tablet (5 mg total) by mouth 2 (two) times daily. 60 tablet 5   benzonatate  (TESSALON ) 100 MG capsule Take 1 capsule (100 mg total) by mouth 3 (three) times daily as needed for cough. 60 capsule 0   diphenoxylate -atropine  (LOMOTIL ) 2.5-0.025 MG tablet Take 1 tablet by mouth 4 (four) times daily as needed for diarrhea or loose stools. Take it along with immodium 60 tablet 0   loratadine (CLARITIN) 10 MG tablet Take 10 mg by mouth daily.     OLANZapine  (ZYPREXA ) 10 MG tablet  Take 1 tablet (10 mg total) by mouth at bedtime. 30 tablet 0   ondansetron  (ZOFRAN ) 8 MG tablet Take 1 tablet (8 mg total) by mouth every 8 (eight) hours as needed for nausea or vomiting. 90 tablet 0   prochlorperazine  (COMPAZINE ) 10 MG tablet Take 1 tablet (10 mg total) by mouth every 6 (six) hours as needed for nausea or vomiting. 30 tablet 2   No current facility-administered medications for this visit.    PHYSICAL EXAMINATION: Vitals:   11/01/23 1053  BP: (!) 129/93  Pulse: 97  Resp: 18  Temp: (!) 96.9 F (36.1 C)  SpO2: 98%   Filed Weights   11/01/23 1053  Weight: 226 lb (102.5 kg)   Physical Exam Vitals reviewed.  Constitutional:      Appearance: She is not ill-appearing.  HENT:     Head: Normocephalic and atraumatic.     Mouth/Throat:     Pharynx: Oropharynx is clear.  Cardiovascular:     Rate and Rhythm: Normal rate and regular rhythm.  Pulmonary:     Effort: No respiratory distress.     Comments: Decreased breath sounds bilaterally.  Abdominal:     Palpations: Abdomen is soft.  Musculoskeletal:        General: Tenderness present. Normal range of motion.     Left lower leg: Edema present.  Skin:    General: Skin is warm.     Coloration: Skin is not pale.  Neurological:     Mental Status: She is alert and oriented to person, place, and time.  Psychiatric:        Mood and Affect: Mood normal.        Behavior: Behavior normal.     LABORATORY DATA:  I have reviewed the data as listed Lab Results  Component Value Date   WBC 4.8 11/01/2023   HGB 11.1 (L) 11/01/2023   HCT 33.2 (L) 11/01/2023   MCV 112.9 (H) 11/01/2023   PLT 279 11/01/2023   Recent Labs    10/01/23 0834 10/15/23 0849 11/01/23 1043  NA 136 138 138  K 4.2 3.8 4.1  CL 105 106 105  CO2 23 23 22   GLUCOSE 107* 100* 109*  BUN 9 10 17   CREATININE 0.69 0.75 0.77  CALCIUM  8.9 8.7* 9.0  GFRNONAA >60 >60 >60  PROT 7.0 7.0 7.2  ALBUMIN 3.6 3.7 4.0  AST 58* 35 35  ALT 40 29 26  ALKPHOS  135* 140* 127*  BILITOT 0.5 0.6 0.6    RADIOGRAPHIC STUDIES: I have personally reviewed the radiological images as listed and  agreed with the findings in the report. No results found.   Assessment & Plan:  Intrahepatic cholangiocarcinoma (HCC) # Intrahepatic Cholangiocarcinoma, stage IV- - s/p liver biopsy on 08/26/2022 showed metastatic moderately differentiated adenocarcinoma.  IHC stain positive for CK7 and CK20.  TTF-1, PAX8, GATA3 and CDX2 are negative.  Differentials include cholangiocarcinoma and pancreatic carcinoma. -PD-L1 0%.  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 stage IV intrahepatic cholangiocarcinoma   # JULY 14th, 2025-  Innumerable metastatic lung nodules with overall increase in the size of the nodules; Ill-defined mass in the left lobe of the liver with increase in the calcifications-given the progressive disease recommend gemcitabine  Abraxane  chemotherapy [steroid sparing].    # JULY 25th, 2025- START GEM-ABRAXANE  q 2 W- with GCSF   # Labs reviewed and acceptable for treatment. Proceed with cycle #4 gemcitabine -abraxane . She will rtc in 2 weeks for consideration of cycle 5 with Dr Rennie.   # MAY 19th, 2025-  DVT of the left lower extremity-  Eliquis  5 mg twice daily for 6 months; and then recommend 2.5 mg twice daily indefinitely given her underlying cholangiocarcinoma. Stable.   # Nausea/poor appetite:  recommend zofran /compazine  [anxiety]; recommend commplaince with zyprexa - 10 mg at bedtime- Stable.   # Diarrhea: G-1- -Chronic.  Imodium/Lomotil  as needed- Stable.   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 -Improving since off steroid.  Will monitor for now.   # Hypertension: Continue with amlodipine  5 mg daily. Stable   # Cough: ongoing- ? Acute bronchitis- resolving- Continue with Tussionex as needed.  Declines any refills at this time. HOLD CXR. Improving.    # History of sarcoidosis. Management per  pulmonary; Never required treatment.   # IV access; port flush q 2 M   # ACP: done-   PS-   DISPOSITION:   Tx today 2 weeks- port/lab, Dr Rennie, gem-abraxane  D3 GCSF 4 weeks- port/lab, Dr Rennie, gem-abraxane  D3 GCSF- la   No problem-specific Assessment & Plan notes found for this encounter.  Above plan of care was discussed with patient/family in detail.  My contact information was given to the patient/family.    Tinnie KANDICE Dawn, NP 11/01/2023

## 2023-11-02 ENCOUNTER — Inpatient Hospital Stay

## 2023-11-02 ENCOUNTER — Encounter: Payer: Self-pay | Admitting: Internal Medicine

## 2023-11-02 ENCOUNTER — Other Ambulatory Visit: Payer: Self-pay | Admitting: Internal Medicine

## 2023-11-02 ENCOUNTER — Telehealth: Payer: Self-pay

## 2023-11-02 LAB — CANCER ANTIGEN 19-9: CA 19-9: 105 U/mL — ABNORMAL HIGH (ref 0–35)

## 2023-11-02 NOTE — Telephone Encounter (Signed)
 Patient had her GCSF appt r/s from this afternoon to this morning.  Unfortunately, tx was not finished until 13:57.    Patient request to confirm with Dr. B if she has to come for GCSF.  I spoke to Dr. KATHEE and she does need to receive GCSF  Please r/s inj to tomorrow am at patient's request.

## 2023-11-03 ENCOUNTER — Encounter: Payer: Self-pay | Admitting: Internal Medicine

## 2023-11-03 ENCOUNTER — Inpatient Hospital Stay

## 2023-11-03 DIAGNOSIS — C221 Intrahepatic bile duct carcinoma: Secondary | ICD-10-CM | POA: Diagnosis not present

## 2023-11-03 DIAGNOSIS — D869 Sarcoidosis, unspecified: Secondary | ICD-10-CM | POA: Diagnosis not present

## 2023-11-03 DIAGNOSIS — C7801 Secondary malignant neoplasm of right lung: Secondary | ICD-10-CM

## 2023-11-03 MED ORDER — PEGFILGRASTIM-JMDB 6 MG/0.6ML ~~LOC~~ SOSY
6.0000 mg | PREFILLED_SYRINGE | Freq: Once | SUBCUTANEOUS | Status: AC
  Administered 2023-11-03: 6 mg via SUBCUTANEOUS
  Filled 2023-11-03: qty 0.6

## 2023-11-10 ENCOUNTER — Encounter: Payer: Self-pay | Admitting: Internal Medicine

## 2023-11-15 ENCOUNTER — Inpatient Hospital Stay: Admitting: Internal Medicine

## 2023-11-15 ENCOUNTER — Inpatient Hospital Stay

## 2023-11-15 ENCOUNTER — Encounter: Payer: Self-pay | Admitting: Internal Medicine

## 2023-11-15 VITALS — BP 128/90 | HR 96 | Temp 98.5°F | Resp 18 | Ht 69.0 in | Wt 222.2 lb

## 2023-11-15 VITALS — BP 129/87 | HR 94

## 2023-11-15 DIAGNOSIS — C7801 Secondary malignant neoplasm of right lung: Secondary | ICD-10-CM | POA: Diagnosis not present

## 2023-11-15 DIAGNOSIS — C221 Intrahepatic bile duct carcinoma: Secondary | ICD-10-CM | POA: Diagnosis not present

## 2023-11-15 DIAGNOSIS — C7802 Secondary malignant neoplasm of left lung: Secondary | ICD-10-CM | POA: Diagnosis not present

## 2023-11-15 DIAGNOSIS — D869 Sarcoidosis, unspecified: Secondary | ICD-10-CM | POA: Diagnosis not present

## 2023-11-15 LAB — CBC WITH DIFFERENTIAL (CANCER CENTER ONLY)
Abs Immature Granulocytes: 0.07 K/uL (ref 0.00–0.07)
Basophils Absolute: 0 K/uL (ref 0.0–0.1)
Basophils Relative: 0 %
Eosinophils Absolute: 0 K/uL (ref 0.0–0.5)
Eosinophils Relative: 1 %
HCT: 33.6 % — ABNORMAL LOW (ref 36.0–46.0)
Hemoglobin: 11.1 g/dL — ABNORMAL LOW (ref 12.0–15.0)
Immature Granulocytes: 1 %
Lymphocytes Relative: 13 %
Lymphs Abs: 0.8 K/uL (ref 0.7–4.0)
MCH: 38.3 pg — ABNORMAL HIGH (ref 26.0–34.0)
MCHC: 33 g/dL (ref 30.0–36.0)
MCV: 115.9 fL — ABNORMAL HIGH (ref 80.0–100.0)
Monocytes Absolute: 0.3 K/uL (ref 0.1–1.0)
Monocytes Relative: 5 %
Neutro Abs: 5.1 K/uL (ref 1.7–7.7)
Neutrophils Relative %: 80 %
Platelet Count: 158 K/uL (ref 150–400)
RBC: 2.9 MIL/uL — ABNORMAL LOW (ref 3.87–5.11)
RDW: 19.3 % — ABNORMAL HIGH (ref 11.5–15.5)
WBC Count: 6.4 K/uL (ref 4.0–10.5)
nRBC: 0 % (ref 0.0–0.2)

## 2023-11-15 LAB — CMP (CANCER CENTER ONLY)
ALT: 45 U/L — ABNORMAL HIGH (ref 0–44)
AST: 54 U/L — ABNORMAL HIGH (ref 15–41)
Albumin: 4 g/dL (ref 3.5–5.0)
Alkaline Phosphatase: 134 U/L — ABNORMAL HIGH (ref 38–126)
Anion gap: 10 (ref 5–15)
BUN: 9 mg/dL (ref 6–20)
CO2: 22 mmol/L (ref 22–32)
Calcium: 8.9 mg/dL (ref 8.9–10.3)
Chloride: 106 mmol/L (ref 98–111)
Creatinine: 0.76 mg/dL (ref 0.44–1.00)
GFR, Estimated: >60 mL/min (ref >60)
Glucose, Bld: 97 mg/dL (ref 70–99)
Potassium: 3.7 mmol/L (ref 3.5–5.1)
Sodium: 138 mmol/L (ref 135–145)
Total Bilirubin: 0.8 mg/dL (ref 0.0–1.2)
Total Protein: 7.2 g/dL (ref 6.5–8.1)

## 2023-11-15 MED ORDER — PACLITAXEL PROTEIN-BOUND CHEMO INJECTION 100 MG
125.0000 mg/m2 | Freq: Once | INTRAVENOUS | Status: AC
  Administered 2023-11-15: 300 mg via INTRAVENOUS
  Filled 2023-11-15: qty 60

## 2023-11-15 MED ORDER — SODIUM CHLORIDE 0.9 % IV SOLN
1000.0000 mg/m2 | Freq: Once | INTRAVENOUS | Status: AC
  Administered 2023-11-15: 2242 mg via INTRAVENOUS
  Filled 2023-11-15: qty 58.97

## 2023-11-15 MED ORDER — SODIUM CHLORIDE 0.9 % IV SOLN
INTRAVENOUS | Status: DC
  Filled 2023-11-15: qty 250

## 2023-11-15 MED ORDER — PROCHLORPERAZINE MALEATE 10 MG PO TABS
10.0000 mg | ORAL_TABLET | Freq: Once | ORAL | Status: AC
  Administered 2023-11-15: 10 mg via ORAL
  Filled 2023-11-15: qty 1

## 2023-11-15 NOTE — Patient Instructions (Signed)

## 2023-11-15 NOTE — Assessment & Plan Note (Addendum)
#   Intrahepatic Cholangiocarcinoma, stage IV- - s/p liver biopsy on 08/26/2022 showed metastatic moderately differentiated adenocarcinoma.  IHC stain positive for CK7 and CK20.  TTF-1, PAX8, GATA3 and CDX2 are negative.  Differentials include cholangiocarcinoma and pancreatic carcinoma. -PD-L1 0%.  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 stage IV intrahepatic cholangiocarcinoma- # JULY 14th, 2025-  Innumerable metastatic lung nodules with overall increase in the size of the nodules; Ill-defined mass in the left lobe of the liver with increase in the calcifications-given the progressive disease recommend gemcitabine  Abraxane  chemotherapy [steroid sparing]. # JULY 25th, 2025- START GEM-ABRAXANE  q 2 W- with GCSF  # Proceed with traetment # 5-gemcitabine  Abraxane  every 2 weeks; Labs-CBC/chemistries were reviewed with the patient. Will plan imaging in 4 weeks; after treatment #6- ordered.   # weight loss/poor taste- prior  evaluation nutrition/Joli re: weight loss- currently on olanzapine -   # Arthritic-  joint pain, mostly in fingers after treatment that last for few day. Resolved by the next treatment. No tingling/ or numbness- monitor aches- on prn Tylenol  .  # MAY 19th, 2025-  DVT of the left lower extremity-  Eliquis  5 mg twice daily for 6 months; and then recommend 2.5 mg twice daily indefinitely given her underlying cholangiocarcinoma. Stable.   # Diarrhea: G-1- -Chronic.  Imodium/Lomotil  as needed- Stable.   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 -Improving since off steroid.  Will monitor for now- Stable.  # Hypertension: -Continue with amlodipine  5 mg daily. Stable; refilled.   # Cough: -ongoing- ? Sec to mets- Continue with Tussionex as needed.  Declines any refills at this time.  called in tessalon  pearls    # History of sarcoidosis- -Management per pulmonary; -Never required treatment.  # IV access;  port flush q 2 M  # JRE:inwz-  # Disability: wants to go back to work- ok to check with employer  PS-  # DISPOSITION:  # chemo today; D-2 injection # in 2  week-MD labs CBC CMP; CA 19-9; chemo; ; D-3 injection # follow up  in 4  week-Dr.Rao/Yu  labs CBC CMP; CA 19-9; chemo - ; D-3 injection- CT CAP prior- #  follow up  in 6  week-MD labs CBC CMP; CA 19-9; chemo - ; D-3 injection-  -Dr.B

## 2023-11-15 NOTE — Progress Notes (Signed)
 Would like to switch treatments to Friday.  Slightly worsened appetite with 4 lb wt loss.  Reports joint pain, mostly in fingers after treatment that last for  few days.

## 2023-11-15 NOTE — Progress Notes (Signed)
 [facility] CONSULT NOTE  Patient Care Team: Justus Leita DEL, MD as PCP - General (Internal Medicine) Verdene Gills, RN as Oncology Nurse Navigator Rennie Cindy SAUNDERS, MD as Consulting Physician (Oncology)  CHIEF COMPLAINTS/PURPOSE OF CONSULTATION: cholangiocarcinoma  Oncology History Overview Note   Cisplatin  25 mg/m/gemcitabine  1000 mg/m/Durvalumab  -completed 8 cycles on 03/04/2023 Maintenance Durvalumab /gemcitabine -started 03/26/2023   ASSESSMENT & PLAN:  Vicki Phillips 58 y.o. 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   # Intrahepatic Cholangiocarcinoma, stage IV - s/p liver biopsy on 08/26/2022 showed metastatic moderately differentiated adenocarcinoma.  IHC stain positive for CK7 and CK20.  TTF-1, PAX8, GATA3 and CDX2 are negative.  Differentials include cholangiocarcinoma and pancreatic carcinoma.    -PD-L1 0%.  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   -s/p cisplatin , gemcitabine  and Durvalumab  every 3 weeks for 8 cycles.  Then on maintenance Durvalumab  and gemcitabine  until March 2025. -With rising CA 19-9, CT chest abdomen pelvis was done which unfortunately showed progression of the lung nodules.   - Based on FGFR 2 gene rearrangement, plan was to start futibatinib  16 mg.  Patient decided to start at full dose 20 mg on 06/04/2023.  Phosphorus is elevated to 7.5.  Dosing guidelines reviewed.  Will decrease dose to 16 mg once daily.  Weekly phosphorus level check.  Started on PhosLo  2 tabs with every meals.  If phosphorus level goes below 7 in the next 2 weeks, we will continue with futibatinib  16 mg once daily.   # JUNE 2025- HOLD futibatinib  sec to myalgias/arthralgias/ocular symptoms  # # JULY 14th, 2025-  Innumerable metastatic lung nodules with overall increase in the size of the nodules; Ill-defined mass in the left lobe of the liver with increase  in the calcifications-given the progressive disease recommend gemcitabine  Abraxane  chemotherapy [steroid sparing].   # JULY 25th, 2025- Proceed with cycle #1-gemcitabine  Abraxane  every 2 weeks;  # MAY 19th, 2025- LEFT DVT- on eliquis  [name] ]     Intrahepatic cholangiocarcinoma (HCC)  08/26/2022 Cancer Staging   Staging form: Intrahepatic Bile Duct, AJCC 8th Edition - Clinical stage from 08/26/2022: Stage IV (cM1) - Signed by Clista Bimler, MD on 09/03/2022 Stage prefix: Initial diagnosis   09/03/2022 Initial Diagnosis   Cholangiocarcinoma (HCC)   09/18/2022 - 04/30/2023 Chemotherapy   Patient is on Treatment Plan : BILIARY TRACT Cisplatin  + Gemcitabine  D1,8 + Durvalumab  (1500) D1 q21d / Durvalumab  (1500) q28d     09/17/2023 -  Chemotherapy   Patient is on Treatment Plan : PANCREATIC Abraxane  D1,8,15 + Gemcitabine  D1,8,15 q28d     Metastasis to lung (HCC)  09/03/2022 Initial Diagnosis   Metastasis to lung (HCC)   09/18/2022 - 04/30/2023 Chemotherapy   Patient is on Treatment Plan : BILIARY TRACT Cisplatin  + Gemcitabine  D1,8 + Durvalumab  (1500) D1 q21d / Durvalumab  (1500) q28d     09/17/2023 -  Chemotherapy   Patient is on Treatment Plan : PANCREATIC Abraxane  D1,8,15 + Gemcitabine  D1,8,15 q28d      HISTORY OF PRESENTING ILLNESS: Patient ambulating-independently. Accompanied by family- son.    Vicki Phillips 58 y.o.  female pleasant patient with a history of intrahepatic stage IV cholangiocarcinoma with metastasis to lungs- MAY 2025-  DVT of the left lower extremity-  Eliquis  on Gem-abraxane  q 2 weeks is here for a follow up.  Reports joint pain, mostly in fingers after treatment that last for few day. Resolved by the next treatment. No tingling/  or numbness.   Would like to switch treatments to Friday. Slightly worsened appetite with 4 lb wt loss.  Patient still has  chronic cough.  Not resolved.  Mild shortness of breath on exertion.  No fevers no chills currently. Denies any  worsening leg swelling.  Continues  to be  on eliquis .    No palpitations. Denies any chest pains.   Review of Systems  Constitutional:  Positive for malaise/fatigue. Negative for chills, diaphoresis, fever and weight loss.  HENT:  Negative for nosebleeds and sore throat.   Eyes:  Negative for double vision.  Respiratory:  Negative for cough, hemoptysis, sputum production, shortness of breath and wheezing.   Cardiovascular:  Positive for leg swelling. Negative for chest pain, palpitations and orthopnea.  Gastrointestinal:  Negative for abdominal pain, blood in stool, constipation, diarrhea, heartburn, melena, nausea and vomiting.  Genitourinary:  Negative for dysuria, frequency and urgency.  Musculoskeletal:  Positive for back pain and joint pain.  Skin: Negative.  Negative for itching and rash.  Neurological:  Negative for dizziness, tingling, focal weakness, weakness and headaches.  Endo/Heme/Allergies:  Does not bruise/bleed easily.  Psychiatric/Behavioral:  Negative for depression. The patient is not nervous/anxious and does not have insomnia.     MEDICAL HISTORY:  Past Medical History:  Diagnosis Date   Allergy    Bile duct cancer (HCC)    Sarcoidosis 2011    SURGICAL HISTORY: Past Surgical History:  Procedure Laterality Date   CESAREAN SECTION     x 2   COLONOSCOPY WITH PROPOFOL  N/A 08/10/2019   Procedure: COLONOSCOPY WITH PROPOFOL ;  Surgeon: Jinny Carmine, MD;  Location: [facility];  Service: Endoscopy;  Laterality: N/A;  priority 4   IR IMAGING GUIDED PORT INSERTION  09/11/2022   LUNG BIOPSY      SOCIAL HISTORY: Social History   Socioeconomic History   Marital status: Single    Spouse name: Not on file   Number of children: 2   Years of education: Not on file   Highest education level: Not on file  Occupational History   Not on file  Tobacco Use   Smoking status: Never   Smokeless tobacco: Never  Vaping Use   Vaping status: Never Used  Substance and  Sexual Activity   Alcohol use: Not Currently    Alcohol/week: 4.0 standard drinks of alcohol    Types: 4 Glasses of wine per week   Drug use: No   Sexual activity: Yes    Partners: Male    Birth control/protection: Surgical  Other Topics Concern   Not on file  Social History Narrative   Not on file   Social Drivers of Health   Financial Resource Strain: Low Risk  (09/18/2022)   Overall Financial Resource Strain (CARDIA)    Difficulty of Paying Living Expenses: Not hard at all  Food Insecurity: No Food Insecurity (08/24/2022)   Hunger Vital Sign    Worried About Running Out of Food in the Last Year: Never true    Ran Out of Food in the Last Year: Never true  Transportation Needs: No Transportation Needs (08/24/2022)   PRAPARE - Administrator, Civil Service (Medical): No    Lack of Transportation (Non-Medical): No  Physical Activity: Not on file  Stress: Not on file  Social Connections: Not on file  Intimate Partner Violence: Not At Risk (08/24/2022)   Humiliation, Afraid, Rape, and Kick questionnaire    Fear of Current or Ex-Partner: No  Emotionally Abused: No    Physically Abused: No    Sexually Abused: No    FAMILY HISTORY: Family History  Problem Relation Age of Onset   Stroke Mother    Heart disease Mother    Heart attack Father 60   Heart disease Father    Kidney disease Sister     ALLERGIES:  is allergic to other.  MEDICATIONS:  Current Outpatient Medications  Medication Sig Dispense Refill   amLODipine  (NORVASC ) 5 MG tablet Take 1 tablet (5 mg total) by mouth daily. 90 tablet 1   apixaban  (ELIQUIS ) 5 MG TABS tablet Take 1 tablet (5 mg total) by mouth 2 (two) times daily. 60 tablet 5   diphenoxylate -atropine  (LOMOTIL ) 2.5-0.025 MG tablet Take 1 tablet by mouth 4 (four) times daily as needed for diarrhea or loose stools. Take it along with immodium 60 tablet 0   loratadine (CLARITIN) 10 MG tablet Take 10 mg by mouth daily.     OLANZapine  (ZYPREXA ) 10  MG tablet Take 1 tablet (10 mg total) by mouth at bedtime. 30 tablet 0   ondansetron  (ZOFRAN ) 8 MG tablet Take 1 tablet (8 mg total) by mouth every 8 (eight) hours as needed for nausea or vomiting. 90 tablet 0   Perfluorohexyloctane (MIEBO) 1.338 GM/ML SOLN Apply 1 drop to eye daily.     prochlorperazine  (COMPAZINE ) 10 MG tablet Take 1 tablet (10 mg total) by mouth every 6 (six) hours as needed for nausea or vomiting. 30 tablet 2   benzonatate  (TESSALON ) 100 MG capsule Take 1 capsule (100 mg total) by mouth 3 (three) times daily as needed for cough. (Patient not taking: Reported on 11/15/2023) 60 capsule 0   No current facility-administered medications for this visit.   Facility-Administered Medications Ordered in Other Visits  Medication Dose Route Frequency Provider Last Rate Last Admin   0.9 %  sodium chloride  infusion   Intravenous Continuous Marzetta Lanza R, MD       0.9 %  sodium chloride  infusion   Intravenous Continuous Estephania Licciardi R, MD 10 mL/hr at 11/15/23 0952 New Bag at 11/15/23 9047   gemcitabine  (GEMZAR ) 2,242 mg in sodium chloride  0.9 % 250 mL chemo infusion  1,000 mg/m2 (Treatment Plan Recorded) Intravenous Once Tamarius Rosenfield R, MD       PACLitaxel -protein bound (ABRAXANE ) chemo infusion 300 mg  125 mg/m2 (Treatment Plan Recorded) Intravenous Once Aleksa Catterton R, MD       prochlorperazine  (COMPAZINE ) tablet 10 mg  10 mg Oral Once Angla Delahunt R, MD        PHYSICAL EXAMINATION:   Vitals:   11/15/23 0849  BP: (!) 128/90  Pulse: 96  Resp: 18  Temp: 98.5 F (36.9 C)  SpO2: 98%    Filed Weights   11/15/23 0849  Weight: 222 lb 3.2 oz (100.8 kg)    LEFT LOWER extremity- Swelling and tenderness.   Physical Exam Vitals and nursing note reviewed.  HENT:     Head: Normocephalic and atraumatic.     Mouth/Throat:     Pharynx: Oropharynx is clear.  Eyes:     Extraocular Movements: Extraocular movements intact.     Pupils: Pupils are  equal, round, and reactive to light.  Cardiovascular:     Rate and Rhythm: Normal rate and regular rhythm.  Pulmonary:     Comments: Decreased breath sounds bilaterally.  Abdominal:     Palpations: Abdomen is soft.  Musculoskeletal:        General: Normal range of  motion.     Cervical back: Normal range of motion.  Skin:    General: Skin is warm.  Neurological:     General: No focal deficit present.     Mental Status: She is alert and oriented to person, place, and time.  Psychiatric:        Behavior: Behavior normal.        Judgment: Judgment normal.     LABORATORY DATA:  I have reviewed the data as listed Lab Results  Component Value Date   WBC 6.4 11/15/2023   HGB 11.1 (L) 11/15/2023   HCT 33.6 (L) 11/15/2023   MCV 115.9 (H) 11/15/2023   PLT 158 11/15/2023   Recent Labs    10/15/23 0849 11/01/23 1043 11/15/23 0848  NA 138 138 138  K 3.8 4.1 3.7  CL 106 105 106  CO2 23 22 22   GLUCOSE 100* 109* 97  BUN 10 17 9   CREATININE 0.75 0.77 0.76  CALCIUM  8.7* 9.0 8.9  GFRNONAA >60 >60 >60  PROT 7.0 7.2 7.2  ALBUMIN 3.7 4.0 4.0  AST 35 35 54*  ALT 29 26 45*  ALKPHOS 140* 127* 134*  BILITOT 0.6 0.6 0.8    RADIOGRAPHIC STUDIES: I have personally reviewed the radiological images as listed and agreed with the findings in the report. No results found.    Intrahepatic cholangiocarcinoma (HCC) # Intrahepatic Cholangiocarcinoma, stage IV- - s/p liver biopsy on 08/26/2022 showed metastatic moderately differentiated adenocarcinoma.  IHC stain positive for CK7 and CK20.  TTF-1, PAX8, GATA3 and CDX2 are negative.  Differentials include cholangiocarcinoma and pancreatic carcinoma. -PD-L1 0%.  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 stage IV intrahepatic cholangiocarcinoma- # JULY 14th, 2025-  Innumerable metastatic lung nodules with overall increase in the size of the nodules; Ill-defined mass in the left lobe of the liver with  increase in the calcifications-given the progressive disease recommend gemcitabine  Abraxane  chemotherapy [steroid sparing]. # JULY 25th, 2025- START GEM-ABRAXANE  q 2 W- with GCSF  # Proceed with traetment # 5-gemcitabine  Abraxane  every 2 weeks; Labs-CBC/chemistries were reviewed with the patient. Will plan imaging in 4 weeks; after treatment #6- ordered.   # weight loss/poor taste- prior  evaluation nutrition/Joli re: weight loss- currently on olanzapine -   # Arthritic-  joint pain, mostly in fingers after treatment that last for few day. Resolved by the next treatment. No tingling/ or numbness- monitor aches- on prn Tylenol  .  # MAY 19th, 2025-  DVT of the left lower extremity-  Eliquis  5 mg twice daily for 6 months; and then recommend 2.5 mg twice daily indefinitely given her underlying cholangiocarcinoma. Stable.   # Diarrhea: G-1- -Chronic.  Imodium/Lomotil  as needed- Stable.   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 -Improving since off steroid.  Will monitor for now- Stable.  # Hypertension: -Continue with amlodipine  5 mg daily. Stable; refilled.   # Cough: -ongoing- ? Sec to mets- Continue with Tussionex as needed.  Declines any refills at this time.  called in tessalon  pearls    # History of sarcoidosis- -Management per pulmonary; -Never required treatment.  # IV access; port flush q 2 M  # JRE:inwz-  # Disability: wants to go back to work- ok to check with employer  PS-  # DISPOSITION:  # chemo today; D-2 injection # in 2  week-MD labs CBC CMP; CA 19-9; chemo; ; D-3 injection # follow up  in 4  week-Dr.Rao/Yu  labs  CBC CMP; CA 19-9; chemo - ; D-3 injection- CT CAP prior- #  follow up  in 6  week-MD labs CBC CMP; CA 19-9; chemo - ; D-3 injection-  -Dr.B    Above plan of care was discussed with patient/family in detail.  My contact information was given to the patient/family.      Cindy JONELLE Joe, MD 11/15/2023 10:00  AM

## 2023-11-16 ENCOUNTER — Inpatient Hospital Stay

## 2023-11-16 ENCOUNTER — Encounter: Payer: Self-pay | Admitting: Internal Medicine

## 2023-11-16 DIAGNOSIS — C221 Intrahepatic bile duct carcinoma: Secondary | ICD-10-CM

## 2023-11-16 DIAGNOSIS — C7801 Secondary malignant neoplasm of right lung: Secondary | ICD-10-CM

## 2023-11-16 LAB — CANCER ANTIGEN 19-9: CA 19-9: 113 U/mL — ABNORMAL HIGH (ref 0–35)

## 2023-11-16 MED ORDER — PEGFILGRASTIM-JMDB 6 MG/0.6ML ~~LOC~~ SOSY
6.0000 mg | PREFILLED_SYRINGE | Freq: Once | SUBCUTANEOUS | Status: AC
  Administered 2023-11-16: 6 mg via SUBCUTANEOUS
  Filled 2023-11-16: qty 0.6

## 2023-11-18 ENCOUNTER — Encounter: Payer: Self-pay | Admitting: Internal Medicine

## 2023-11-18 ENCOUNTER — Telehealth: Payer: Self-pay

## 2023-11-18 NOTE — Telephone Encounter (Signed)
 Completed form faxed to [employer].

## 2023-11-18 NOTE — Telephone Encounter (Signed)
 Request for extension of short term disability that ends on 11/14/23 received from [employer].  Confirmed with patient that she does not plan on returning to work at this time and would like an extension on disability.

## 2023-11-29 ENCOUNTER — Telehealth: Payer: Self-pay

## 2023-11-29 ENCOUNTER — Encounter: Payer: Self-pay | Admitting: Internal Medicine

## 2023-11-29 ENCOUNTER — Other Ambulatory Visit: Payer: Self-pay

## 2023-11-29 ENCOUNTER — Inpatient Hospital Stay (HOSPITAL_BASED_OUTPATIENT_CLINIC_OR_DEPARTMENT_OTHER): Admitting: Internal Medicine

## 2023-11-29 ENCOUNTER — Inpatient Hospital Stay

## 2023-11-29 ENCOUNTER — Inpatient Hospital Stay: Attending: Internal Medicine

## 2023-11-29 VITALS — BP 136/92 | HR 89 | Temp 96.0°F | Resp 18

## 2023-11-29 DIAGNOSIS — C7951 Secondary malignant neoplasm of bone: Secondary | ICD-10-CM | POA: Insufficient documentation

## 2023-11-29 DIAGNOSIS — C221 Intrahepatic bile duct carcinoma: Secondary | ICD-10-CM

## 2023-11-29 DIAGNOSIS — Z86718 Personal history of other venous thrombosis and embolism: Secondary | ICD-10-CM | POA: Diagnosis not present

## 2023-11-29 DIAGNOSIS — Z7901 Long term (current) use of anticoagulants: Secondary | ICD-10-CM | POA: Insufficient documentation

## 2023-11-29 DIAGNOSIS — T451X5A Adverse effect of antineoplastic and immunosuppressive drugs, initial encounter: Secondary | ICD-10-CM | POA: Diagnosis not present

## 2023-11-29 DIAGNOSIS — Z5111 Encounter for antineoplastic chemotherapy: Secondary | ICD-10-CM | POA: Insufficient documentation

## 2023-11-29 DIAGNOSIS — C7802 Secondary malignant neoplasm of left lung: Secondary | ICD-10-CM | POA: Diagnosis not present

## 2023-11-29 DIAGNOSIS — C7801 Secondary malignant neoplasm of right lung: Secondary | ICD-10-CM | POA: Diagnosis not present

## 2023-11-29 DIAGNOSIS — Z79899 Other long term (current) drug therapy: Secondary | ICD-10-CM | POA: Insufficient documentation

## 2023-11-29 DIAGNOSIS — G620 Drug-induced polyneuropathy: Secondary | ICD-10-CM | POA: Insufficient documentation

## 2023-11-29 LAB — CBC WITH DIFFERENTIAL (CANCER CENTER ONLY)
Abs Immature Granulocytes: 0.07 K/uL (ref 0.00–0.07)
Basophils Absolute: 0 K/uL (ref 0.0–0.1)
Basophils Relative: 0 %
Eosinophils Absolute: 0 K/uL (ref 0.0–0.5)
Eosinophils Relative: 1 %
HCT: 30.4 % — ABNORMAL LOW (ref 36.0–46.0)
Hemoglobin: 10.4 g/dL — ABNORMAL LOW (ref 12.0–15.0)
Immature Granulocytes: 2 %
Lymphocytes Relative: 13 %
Lymphs Abs: 0.6 K/uL — ABNORMAL LOW (ref 0.7–4.0)
MCH: 40.3 pg — ABNORMAL HIGH (ref 26.0–34.0)
MCHC: 34.2 g/dL (ref 30.0–36.0)
MCV: 117.8 fL — ABNORMAL HIGH (ref 80.0–100.0)
Monocytes Absolute: 0.3 K/uL (ref 0.1–1.0)
Monocytes Relative: 7 %
Neutro Abs: 3.5 K/uL (ref 1.7–7.7)
Neutrophils Relative %: 77 %
Platelet Count: 164 K/uL (ref 150–400)
RBC: 2.58 MIL/uL — ABNORMAL LOW (ref 3.87–5.11)
RDW: 19.4 % — ABNORMAL HIGH (ref 11.5–15.5)
WBC Count: 4.5 K/uL (ref 4.0–10.5)
nRBC: 0 % (ref 0.0–0.2)

## 2023-11-29 LAB — CMP (CANCER CENTER ONLY)
ALT: 73 U/L — ABNORMAL HIGH (ref 0–44)
AST: 87 U/L — ABNORMAL HIGH (ref 15–41)
Albumin: 3.8 g/dL (ref 3.5–5.0)
Alkaline Phosphatase: 123 U/L (ref 38–126)
Anion gap: 9 (ref 5–15)
BUN: 10 mg/dL (ref 6–20)
CO2: 23 mmol/L (ref 22–32)
Calcium: 8.8 mg/dL — ABNORMAL LOW (ref 8.9–10.3)
Chloride: 108 mmol/L (ref 98–111)
Creatinine: 0.73 mg/dL (ref 0.44–1.00)
GFR, Estimated: >60 mL/min (ref >60)
Glucose, Bld: 93 mg/dL (ref 70–99)
Potassium: 3.8 mmol/L (ref 3.5–5.1)
Sodium: 140 mmol/L (ref 135–145)
Total Bilirubin: 0.7 mg/dL (ref 0.0–1.2)
Total Protein: 6.9 g/dL (ref 6.5–8.1)

## 2023-11-29 MED ORDER — SODIUM CHLORIDE 0.9 % IV SOLN
INTRAVENOUS | Status: DC
  Filled 2023-11-29: qty 250

## 2023-11-29 MED ORDER — SODIUM CHLORIDE 0.9 % IV SOLN
1000.0000 mg/m2 | Freq: Once | INTRAVENOUS | Status: AC
  Administered 2023-11-29: 2242 mg via INTRAVENOUS
  Filled 2023-11-29: qty 58.97

## 2023-11-29 MED ORDER — PROCHLORPERAZINE MALEATE 10 MG PO TABS
10.0000 mg | ORAL_TABLET | Freq: Once | ORAL | Status: AC
  Administered 2023-11-29: 10 mg via ORAL
  Filled 2023-11-29: qty 1

## 2023-11-29 MED ORDER — PACLITAXEL PROTEIN-BOUND CHEMO INJECTION 100 MG
125.0000 mg/m2 | Freq: Once | INTRAVENOUS | Status: AC
  Administered 2023-11-29: 300 mg via INTRAVENOUS
  Filled 2023-11-29: qty 60

## 2023-11-29 NOTE — Telephone Encounter (Addendum)
 HER2 requested on specimen [id number], liver, with GPA. Order placed for tempus liquid biopsy. Specimen to be drawn 12/17/23.

## 2023-11-29 NOTE — Assessment & Plan Note (Addendum)
#   Intrahepatic Cholangiocarcinoma, stage IV- - s/p liver biopsy on 08/26/2022 showed metastatic moderately differentiated adenocarcinoma.  IHC stain positive for CK7 and CK20.  TTF-1, PAX8, GATA3 and CDX2 are negative.  Differentials include cholangiocarcinoma and pancreatic carcinoma. -PD-L1 0%.  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 stage IV intrahepatic cholangiocarcinoma- # JULY 14th, 2025-  Innumerable metastatic lung nodules with overall increase in the size of the nodules; Ill-defined mass in the left lobe of the liver with increase in the calcifications-given the progressive disease recommend gemcitabine  Abraxane  chemotherapy [steroid sparing]. # JULY 25th, 2025- START GEM-ABRAXANE  q 2 W- with GCSF  # Proceed with traetment # 6-gemcitabine  Abraxane  every 2 weeks; Labs-CBC/chemistries were reviewed with the patient. Will plan imaging in 2 weeks; after treatment #6- ordered. CT- ordered for 10/21-Pending. TEMPUS BLOOD; check for her 2 IHC on path  # weight loss/poor taste- prior  evaluation nutrition/Joli re: weight loss- currently on olanzapine -   # Arthritic-  joint pain, mostly in fingers after treatment that last for few day. Resolved by the next treatment. No tingling/ or numbness- monitor aches- on prn Tylenol  .  # MAY 19th, 2025-  DVT of the left lower extremity-  Eliquis  5 mg twice daily for 6 months; and then recommend 2.5 mg twice daily indefinitely given her underlying cholangiocarcinoma. Stable.   # Diarrhea: G-1- -Chronic.  Imodium/Lomotil  as needed- Stable.   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 -Improving since off steroid.  Will monitor for now- Stable.  # Hypertension: -Continue with amlodipine  5 mg daily. Stable; refilled.   # Cough: -ongoing- ? Sec to mets- Continue with Tussionex as needed.  Declines any refills at this time.  called in tessalon  pearls    # History of  sarcoidosis- -Management per pulmonary; -Never required treatment.  # IV access; port flush q 2 M  # JRE:inwz-  # Disability: wants to go back to work- ok to check with employer  PS-  # DISPOSITION:  # chemo today; D-2 injection # as planned- in 2  week-MD labs CBC CMP; CA 19-9; vit D 25-OH- chemo; ORDER TEMPUS BLOOD  D-3 injection # follow up  in 4  week-MD- labs CBC CMP; CA 19-9; chemo - ; D-3 injection- Dr.B

## 2023-11-29 NOTE — Progress Notes (Signed)
 Patient here for follow up; patient denies any new concerns

## 2023-11-29 NOTE — Progress Notes (Signed)
 [facility] CONSULT NOTE  Patient Care Team: Justus Leita DEL, MD as PCP - General (Internal Medicine) Verdene Gills, RN as Oncology Nurse Navigator Rennie Cindy SAUNDERS, MD as Consulting Physician (Oncology)  CHIEF COMPLAINTS/PURPOSE OF CONSULTATION: cholangiocarcinoma  Oncology History Overview Note   Cisplatin  25 mg/m/gemcitabine  1000 mg/m/Durvalumab  -completed 8 cycles on 03/04/2023 Maintenance Durvalumab /gemcitabine -started 03/26/2023   ASSESSMENT & PLAN:  Annalei Friesz 58 y.o. 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   # Intrahepatic Cholangiocarcinoma, stage IV - s/p liver biopsy on 08/26/2022 showed metastatic moderately differentiated adenocarcinoma.  IHC stain positive for CK7 and CK20.  TTF-1, PAX8, GATA3 and CDX2 are negative.  Differentials include cholangiocarcinoma and pancreatic carcinoma.    -PD-L1 0%.  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   -s/p cisplatin , gemcitabine  and Durvalumab  every 3 weeks for 8 cycles.  Then on maintenance Durvalumab  and gemcitabine  until March 2025. -With rising CA 19-9, CT chest abdomen pelvis was done which unfortunately showed progression of the lung nodules.   - Based on FGFR 2 gene rearrangement, plan was to start futibatinib  16 mg.  Patient decided to start at full dose 20 mg on 06/04/2023.  Phosphorus is elevated to 7.5.  Dosing guidelines reviewed.  Will decrease dose to 16 mg once daily.  Weekly phosphorus level check.  Started on PhosLo  2 tabs with every meals.  If phosphorus level goes below 7 in the next 2 weeks, we will continue with futibatinib  16 mg once daily.   # JUNE 2025- HOLD futibatinib  sec to myalgias/arthralgias/ocular symptoms  # # JULY 14th, 2025-  Innumerable metastatic lung nodules with overall increase in the size of the nodules; Ill-defined mass in the left lobe of the liver with increase  in the calcifications-given the progressive disease recommend gemcitabine  Abraxane  chemotherapy [steroid sparing].   # JULY 25th, 2025- Proceed with cycle #1-gemcitabine  Abraxane  every 2 weeks;  # MAY 19th, 2025- LEFT DVT- on eliquis  [name] ]     Intrahepatic cholangiocarcinoma (HCC)  08/26/2022 Cancer Staging   Staging form: Intrahepatic Bile Duct, AJCC 8th Edition - Clinical stage from 08/26/2022: Stage IV (cM1) - Signed by Clista Bimler, MD on 09/03/2022 Stage prefix: Initial diagnosis   09/03/2022 Initial Diagnosis   Cholangiocarcinoma (HCC)   09/18/2022 - 04/30/2023 Chemotherapy   Patient is on Treatment Plan : BILIARY TRACT Cisplatin  + Gemcitabine  D1,8 + Durvalumab  (1500) D1 q21d / Durvalumab  (1500) q28d     09/17/2023 -  Chemotherapy   Patient is on Treatment Plan : PANCREATIC Abraxane  D1,8,15 + Gemcitabine  D1,8,15 q28d     Metastasis to lung (HCC)  09/03/2022 Initial Diagnosis   Metastasis to lung (HCC)   09/18/2022 - 04/30/2023 Chemotherapy   Patient is on Treatment Plan : BILIARY TRACT Cisplatin  + Gemcitabine  D1,8 + Durvalumab  (1500) D1 q21d / Durvalumab  (1500) q28d     09/17/2023 -  Chemotherapy   Patient is on Treatment Plan : PANCREATIC Abraxane  D1,8,15 + Gemcitabine  D1,8,15 q28d      HISTORY OF PRESENTING ILLNESS: Patient ambulating-independently. Alone.   Sarissa Dern 58 y.o.  female pleasant patient with a history of intrahepatic stage IV cholangiocarcinoma with metastasis to lungs- MAY 2025-  DVT of the left lower extremity-  Eliquis  on Gem-abraxane  q 2 weeks is here for a follow up.  Reports joint pain, mostly in fingers after treatment that last for few day. Resolved by the next treatment. Mild tingling numbness in th toes.  Patient still has  chronic cough.  Not resolved.  Mild shortness of breath on exertion.  No fevers no chills currently. Denies any worsening leg swelling.  Continues  to be  on eliquis .    No palpitations. Denies any chest pains.    Review of Systems  Constitutional:  Positive for malaise/fatigue. Negative for chills, diaphoresis, fever and weight loss.  HENT:  Negative for nosebleeds and sore throat.   Eyes:  Negative for double vision.  Respiratory:  Negative for cough, hemoptysis, sputum production, shortness of breath and wheezing.   Cardiovascular:  Positive for leg swelling. Negative for chest pain, palpitations and orthopnea.  Gastrointestinal:  Negative for abdominal pain, blood in stool, constipation, diarrhea, heartburn, melena, nausea and vomiting.  Genitourinary:  Negative for dysuria, frequency and urgency.  Musculoskeletal:  Positive for back pain and joint pain.  Skin: Negative.  Negative for itching and rash.  Neurological:  Negative for dizziness, tingling, focal weakness, weakness and headaches.  Endo/Heme/Allergies:  Does not bruise/bleed easily.  Psychiatric/Behavioral:  Negative for depression. The patient is not nervous/anxious and does not have insomnia.     MEDICAL HISTORY:  Past Medical History:  Diagnosis Date   Allergy    Bile duct cancer (HCC)    Sarcoidosis 2011    SURGICAL HISTORY: Past Surgical History:  Procedure Laterality Date   CESAREAN SECTION     x 2   COLONOSCOPY WITH PROPOFOL  N/A 08/10/2019   Procedure: COLONOSCOPY WITH PROPOFOL ;  Surgeon: Jinny Carmine, MD;  [facility];  Service: Endoscopy;  Laterality: N/A;  priority 4   IR IMAGING GUIDED PORT INSERTION  09/11/2022   LUNG BIOPSY      SOCIAL HISTORY: Social History   Socioeconomic History   Marital status: Single    Spouse name: Not on file   Number of children: 2   Years of education: Not on file   Highest education level: Not on file  Occupational History   Not on file  Tobacco Use   Smoking status: Never   Smokeless tobacco: Never  Vaping Use   Vaping status: Never Used  Substance and Sexual Activity   Alcohol use: Not Currently    Alcohol/week: 4.0 standard drinks of alcohol     Types: 4 Glasses of wine per week   Drug use: No   Sexual activity: Yes    Partners: Male    Birth control/protection: Surgical  Other Topics Concern   Not on file  Social History Narrative   Not on file   Social Drivers of Health   Financial Resource Strain: Low Risk  (09/18/2022)   Overall Financial Resource Strain (CARDIA)    Difficulty of Paying Living Expenses: Not hard at all  Food Insecurity: No Food Insecurity (08/24/2022)   Hunger Vital Sign    Worried About Running Out of Food in the Last Year: Never true    Ran Out of Food in the Last Year: Never true  Transportation Needs: No Transportation Needs (08/24/2022)   PRAPARE - Administrator, Civil Service (Medical): No    Lack of Transportation (Non-Medical): No  Physical Activity: Not on file  Stress: Not on file  Social Connections: Not on file  Intimate Partner Violence: Not At Risk (08/24/2022)   Humiliation, Afraid, Rape, and Kick questionnaire    Fear of Current or Ex-Partner: No    Emotionally Abused: No    Physically Abused: No    Sexually Abused: No    FAMILY HISTORY:  Family History  Problem Relation Age of Onset   Stroke Mother    Heart disease Mother    Heart attack Father 64   Heart disease Father    Kidney disease Sister     ALLERGIES:  is allergic to other.  MEDICATIONS:  Current Outpatient Medications  Medication Sig Dispense Refill   amLODipine  (NORVASC ) 5 MG tablet Take 1 tablet (5 mg total) by mouth daily. 90 tablet 1   apixaban  (ELIQUIS ) 5 MG TABS tablet Take 1 tablet (5 mg total) by mouth 2 (two) times daily. 60 tablet 5   diphenoxylate -atropine  (LOMOTIL ) 2.5-0.025 MG tablet Take 1 tablet by mouth 4 (four) times daily as needed for diarrhea or loose stools. Take it along with immodium 60 tablet 0   loratadine (CLARITIN) 10 MG tablet Take 10 mg by mouth daily.     OLANZapine  (ZYPREXA ) 10 MG tablet Take 1 tablet (10 mg total) by mouth at bedtime. 30 tablet 0   ondansetron  (ZOFRAN ) 8  MG tablet Take 1 tablet (8 mg total) by mouth every 8 (eight) hours as needed for nausea or vomiting. 90 tablet 0   Perfluorohexyloctane (MIEBO) 1.338 GM/ML SOLN Apply 1 drop to eye daily.     prochlorperazine  (COMPAZINE ) 10 MG tablet Take 1 tablet (10 mg total) by mouth every 6 (six) hours as needed for nausea or vomiting. 30 tablet 2   benzonatate  (TESSALON ) 100 MG capsule Take 1 capsule (100 mg total) by mouth 3 (three) times daily as needed for cough. (Patient not taking: Reported on 11/29/2023) 60 capsule 0   No current facility-administered medications for this visit.    PHYSICAL EXAMINATION:   Vitals:   11/29/23 0914  BP: (!) 133/91  Pulse: 91  Resp: 16  Temp: 98.1 F (36.7 C)  SpO2: 98%    Filed Weights   11/29/23 0914  Weight: 223 lb (101.2 kg)    LEFT LOWER extremity- Swelling and tenderness.   Physical Exam Vitals and nursing note reviewed.  HENT:     Head: Normocephalic and atraumatic.     Mouth/Throat:     Pharynx: Oropharynx is clear.  Eyes:     Extraocular Movements: Extraocular movements intact.     Pupils: Pupils are equal, round, and reactive to light.  Cardiovascular:     Rate and Rhythm: Normal rate and regular rhythm.  Pulmonary:     Comments: Decreased breath sounds bilaterally.  Abdominal:     Palpations: Abdomen is soft.  Musculoskeletal:        General: Normal range of motion.     Cervical back: Normal range of motion.  Skin:    General: Skin is warm.  Neurological:     General: No focal deficit present.     Mental Status: She is alert and oriented to person, place, and time.  Psychiatric:        Behavior: Behavior normal.        Judgment: Judgment normal.     LABORATORY DATA:  I have reviewed the data as listed Lab Results  Component Value Date   WBC 4.5 11/29/2023   HGB 10.4 (L) 11/29/2023   HCT 30.4 (L) 11/29/2023   MCV 117.8 (H) 11/29/2023   PLT 164 11/29/2023   Recent Labs    11/01/23 1043 11/15/23 0848 11/29/23 0906   NA 138 138 140  K 4.1 3.7 3.8  CL 105 106 108  CO2 22 22 23   GLUCOSE 109* 97 93  BUN 17 9 10   CREATININE  0.77 0.76 0.73  CALCIUM  9.0 8.9 8.8*  GFRNONAA >60 >60 >60  PROT 7.2 7.2 6.9  ALBUMIN 4.0 4.0 3.8  AST 35 54* 87*  ALT 26 45* 73*  ALKPHOS 127* 134* 123  BILITOT 0.6 0.8 0.7    RADIOGRAPHIC STUDIES: I have personally reviewed the radiological images as listed and agreed with the findings in the report. No results found.    Intrahepatic cholangiocarcinoma (HCC) # Intrahepatic Cholangiocarcinoma, stage IV- - s/p liver biopsy on 08/26/2022 showed metastatic moderately differentiated adenocarcinoma.  IHC stain positive for CK7 and CK20.  TTF-1, PAX8, GATA3 and CDX2 are negative.  Differentials include cholangiocarcinoma and pancreatic carcinoma. -PD-L1 0%.  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 stage IV intrahepatic cholangiocarcinoma- # JULY 14th, 2025-  Innumerable metastatic lung nodules with overall increase in the size of the nodules; Ill-defined mass in the left lobe of the liver with increase in the calcifications-given the progressive disease recommend gemcitabine  Abraxane  chemotherapy [steroid sparing]. # JULY 25th, 2025- START GEM-ABRAXANE  q 2 W- with GCSF  # Proceed with traetment # 5-gemcitabine  Abraxane  every 2 weeks; Labs-CBC/chemistries were reviewed with the patient. Will plan imaging in 4 weeks; after treatment #6- ordered.   # weight loss/poor taste- prior  evaluation nutrition/Joli re: weight loss- currently on olanzapine -   # Arthritic-  joint pain, mostly in fingers after treatment that last for few day. Resolved by the next treatment. No tingling/ or numbness- monitor aches- on prn Tylenol  .  # MAY 19th, 2025-  DVT of the left lower extremity-  Eliquis  5 mg twice daily for 6 months; and then recommend 2.5 mg twice daily indefinitely given her underlying cholangiocarcinoma. Stable.   # Diarrhea: G-1-  -Chronic.  Imodium/Lomotil  as needed- Stable.   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 -Improving since off steroid.  Will monitor for now- Stable.  # Hypertension: -Continue with amlodipine  5 mg daily. Stable; refilled.   # Cough: -ongoing- ? Sec to mets- Continue with Tussionex as needed.  Declines any refills at this time.  called in tessalon  pearls    # History of sarcoidosis- -Management per pulmonary; -Never required treatment.  # IV access; port flush q 2 M  # JRE:inwz-  # Disability: wants to go back to work- ok to check with employer  PS-  # DISPOSITION:  # chemo today; D-2 injection # in 2  week-MD labs CBC CMP; CA 19-9; chemo; ; D-3 injection # follow up  in 4  week-Dr.Rao/Yu  labs CBC CMP; CA 19-9; chemo - ; D-3 injection- CT CAP prior- #  follow up  in 6  week-MD labs CBC CMP; CA 19-9; chemo - ; D-3 injection-  -Dr.B    Above plan of care was discussed with patient/family in detail.  My contact information was given to the patient/family.      Cindy JONELLE Joe, MD 11/29/2023 10:13 AM

## 2023-11-29 NOTE — Progress Notes (Signed)
 Per Dr Rennie Belton to treat with AST 87

## 2023-11-30 ENCOUNTER — Inpatient Hospital Stay

## 2023-11-30 ENCOUNTER — Encounter: Payer: Self-pay | Admitting: Internal Medicine

## 2023-11-30 DIAGNOSIS — C221 Intrahepatic bile duct carcinoma: Secondary | ICD-10-CM

## 2023-11-30 DIAGNOSIS — C7801 Secondary malignant neoplasm of right lung: Secondary | ICD-10-CM | POA: Diagnosis not present

## 2023-11-30 DIAGNOSIS — C7802 Secondary malignant neoplasm of left lung: Secondary | ICD-10-CM

## 2023-11-30 MED ORDER — PEGFILGRASTIM-JMDB 6 MG/0.6ML ~~LOC~~ SOSY
6.0000 mg | PREFILLED_SYRINGE | Freq: Once | SUBCUTANEOUS | Status: AC
  Administered 2023-11-30: 6 mg via SUBCUTANEOUS
  Filled 2023-11-30: qty 0.6

## 2023-12-01 LAB — CA 19-9 (SERIAL): CA 19-9: 93 U/mL — ABNORMAL HIGH (ref 0–35)

## 2023-12-03 ENCOUNTER — Encounter: Payer: Self-pay | Admitting: Internal Medicine

## 2023-12-05 ENCOUNTER — Other Ambulatory Visit: Payer: Self-pay | Admitting: Internal Medicine

## 2023-12-06 ENCOUNTER — Encounter: Payer: Self-pay | Admitting: Internal Medicine

## 2023-12-07 ENCOUNTER — Other Ambulatory Visit: Payer: Self-pay | Admitting: *Deleted

## 2023-12-07 ENCOUNTER — Encounter: Payer: Self-pay | Admitting: Oncology

## 2023-12-14 ENCOUNTER — Ambulatory Visit
Admission: RE | Admit: 2023-12-14 | Discharge: 2023-12-14 | Disposition: A | Discharge status: Home / Self Care | Source: Ambulatory Visit | Attending: Internal Medicine | Admitting: Internal Medicine

## 2023-12-14 DIAGNOSIS — R918 Other nonspecific abnormal finding of lung field: Secondary | ICD-10-CM | POA: Diagnosis not present

## 2023-12-14 DIAGNOSIS — C7951 Secondary malignant neoplasm of bone: Secondary | ICD-10-CM | POA: Diagnosis not present

## 2023-12-14 DIAGNOSIS — K5730 Diverticulosis of large intestine without perforation or abscess without bleeding: Secondary | ICD-10-CM | POA: Diagnosis not present

## 2023-12-14 DIAGNOSIS — C221 Intrahepatic bile duct carcinoma: Secondary | ICD-10-CM

## 2023-12-14 MED ORDER — SODIUM CHLORIDE 0.9% FLUSH: 10.0000 mL | INTRAVENOUS | Status: DC | PRN

## 2023-12-14 MED ORDER — IOPAMIDOL (ISOVUE-300) INJECTION 61%
100.0000 mL | Freq: Once | INTRAVENOUS | Status: AC | PRN
Start: 2023-12-14 — End: 2023-12-14
  Administered 2023-12-14: 100 mL via INTRAVENOUS

## 2023-12-14 MED ORDER — HEPARIN SOD (PORK) LOCK FLUSH 100 UNIT/ML IV SOLN
500.0000 [IU] | Freq: Once | INTRAVENOUS | Status: AC
  Administered 2023-12-14: 500 [IU] via INTRAVENOUS

## 2023-12-17 ENCOUNTER — Telehealth: Payer: Self-pay | Admitting: *Deleted

## 2023-12-17 ENCOUNTER — Encounter: Payer: Self-pay | Admitting: Internal Medicine

## 2023-12-17 ENCOUNTER — Inpatient Hospital Stay

## 2023-12-17 ENCOUNTER — Encounter: Payer: Self-pay | Admitting: Oncology

## 2023-12-17 ENCOUNTER — Inpatient Hospital Stay (HOSPITAL_BASED_OUTPATIENT_CLINIC_OR_DEPARTMENT_OTHER): Admitting: Oncology

## 2023-12-17 VITALS — BP 127/84 | HR 88

## 2023-12-17 VITALS — BP 120/92 | HR 90 | Temp 98.6°F | Resp 17 | Ht 69.0 in | Wt 225.3 lb

## 2023-12-17 DIAGNOSIS — C221 Intrahepatic bile duct carcinoma: Secondary | ICD-10-CM

## 2023-12-17 DIAGNOSIS — C7801 Secondary malignant neoplasm of right lung: Secondary | ICD-10-CM

## 2023-12-17 DIAGNOSIS — C7802 Secondary malignant neoplasm of left lung: Secondary | ICD-10-CM | POA: Diagnosis not present

## 2023-12-17 LAB — CMP (CANCER CENTER ONLY)
ALT: 56 U/L — ABNORMAL HIGH (ref 0–44)
AST: 66 U/L — ABNORMAL HIGH (ref 15–41)
Albumin: 3.8 g/dL (ref 3.5–5.0)
Alkaline Phosphatase: 104 U/L (ref 38–126)
Anion gap: 10 (ref 5–15)
BUN: 10 mg/dL (ref 6–20)
CO2: 22 mmol/L (ref 22–32)
Calcium: 8.8 mg/dL — ABNORMAL LOW (ref 8.9–10.3)
Chloride: 106 mmol/L (ref 98–111)
Creatinine: 0.75 mg/dL (ref 0.44–1.00)
GFR, Estimated: >60 mL/min (ref >60)
Glucose, Bld: 100 mg/dL — ABNORMAL HIGH (ref 70–99)
Potassium: 3.7 mmol/L (ref 3.5–5.1)
Sodium: 138 mmol/L (ref 135–145)
Total Bilirubin: 0.8 mg/dL (ref 0.0–1.2)
Total Protein: 7 g/dL (ref 6.5–8.1)

## 2023-12-17 LAB — CBC WITH DIFFERENTIAL (CANCER CENTER ONLY)
Abs Immature Granulocytes: 0.02 K/uL (ref 0.00–0.07)
Basophils Absolute: 0 K/uL (ref 0.0–0.1)
Basophils Relative: 0 %
Eosinophils Absolute: 0.1 K/uL (ref 0.0–0.5)
Eosinophils Relative: 2 %
HCT: 33 % — ABNORMAL LOW (ref 36.0–46.0)
Hemoglobin: 11.1 g/dL — ABNORMAL LOW (ref 12.0–15.0)
Immature Granulocytes: 1 %
Lymphocytes Relative: 16 %
Lymphs Abs: 0.5 K/uL — ABNORMAL LOW (ref 0.7–4.0)
MCH: 41.7 pg — ABNORMAL HIGH (ref 26.0–34.0)
MCHC: 33.6 g/dL (ref 30.0–36.0)
MCV: 124.1 fL — ABNORMAL HIGH (ref 80.0–100.0)
Monocytes Absolute: 0.2 K/uL (ref 0.1–1.0)
Monocytes Relative: 8 %
Neutro Abs: 2.4 K/uL (ref 1.7–7.7)
Neutrophils Relative %: 73 %
Platelet Count: 251 K/uL (ref 150–400)
RBC: 2.66 MIL/uL — ABNORMAL LOW (ref 3.87–5.11)
RDW: 18.6 % — ABNORMAL HIGH (ref 11.5–15.5)
WBC Count: 3.2 K/uL — ABNORMAL LOW (ref 4.0–10.5)
nRBC: 0 % (ref 0.0–0.2)

## 2023-12-17 LAB — VITAMIN D 25 HYDROXY (VIT D DEFICIENCY, FRACTURES): Vit D, 25-Hydroxy: 19.64 ng/mL — ABNORMAL LOW (ref 30–100)

## 2023-12-17 LAB — MISCELLANEOUS TEST

## 2023-12-17 MED ORDER — APIXABAN 5 MG PO TABS: 5.0000 mg | ORAL_TABLET | Freq: Two times a day (BID) | ORAL | 5 refills | Status: AC

## 2023-12-17 MED ORDER — PROCHLORPERAZINE MALEATE 10 MG PO TABS
10.0000 mg | ORAL_TABLET | Freq: Once | ORAL | Status: AC
  Administered 2023-12-17: 10 mg via ORAL
  Filled 2023-12-17: qty 1

## 2023-12-17 MED ORDER — SODIUM CHLORIDE 0.9 % IV SOLN
1000.0000 mg/m2 | Freq: Once | INTRAVENOUS | Status: AC
  Administered 2023-12-17: 2242 mg via INTRAVENOUS
  Filled 2023-12-17 (×2): qty 59

## 2023-12-17 MED ORDER — PACLITAXEL PROTEIN-BOUND CHEMO INJECTION 100 MG
125.0000 mg/m2 | Freq: Once | INTRAVENOUS | Status: AC
  Administered 2023-12-17: 300 mg via INTRAVENOUS
  Filled 2023-12-17: qty 60

## 2023-12-17 MED ORDER — SODIUM CHLORIDE 0.9 % IV SOLN
INTRAVENOUS | Status: DC
  Filled 2023-12-17: qty 250

## 2023-12-17 NOTE — Patient Instructions (Signed)
 [facility] - A DEPT OF [facility]  Discharge Instructions: Thank you for choosing [facility] to provide your oncology and hematology care.  If you have a lab appointment with the Cancer Center, please go directly to the Cancer Center and check in at the registration area.  Wear comfortable clothing and clothing appropriate for easy access to any Portacath or PICC line.   We strive to give you quality time with your provider. You may need to reschedule your appointment if you arrive late (15 or more minutes).  Arriving late affects you and other patients whose appointments are after yours.  Also, if you miss three or more appointments without notifying the office, you may be dismissed from the clinic at the provider's discretion.      For prescription refill requests, have your pharmacy contact our office and allow 72 hours for refills to be completed.    Today you received the following chemotherapy and/or immunotherapy agents- gemzar , abraxane       To help prevent nausea and vomiting after your treatment, we encourage you to take your nausea medication as directed.  BELOW ARE SYMPTOMS THAT SHOULD BE REPORTED IMMEDIATELY: *FEVER GREATER THAN 100.4 F (38 C) OR HIGHER *CHILLS OR SWEATING *NAUSEA AND VOMITING THAT IS NOT CONTROLLED WITH YOUR NAUSEA MEDICATION *UNUSUAL SHORTNESS OF BREATH *UNUSUAL BRUISING OR BLEEDING *URINARY PROBLEMS (pain or burning when urinating, or frequent urination) *BOWEL PROBLEMS (unusual diarrhea, constipation, pain near the anus) TENDERNESS IN MOUTH AND THROAT WITH OR WITHOUT PRESENCE OF ULCERS (sore throat, sores in mouth, or a toothache) UNUSUAL RASH, SWELLING OR PAIN  UNUSUAL VAGINAL DISCHARGE OR ITCHING   Items with * indicate a potential emergency and should be followed up as soon as possible or go to the Emergency Department if any problems should occur.  Please show the CHEMOTHERAPY ALERT CARD or  IMMUNOTHERAPY ALERT CARD at check-in to the Emergency Department and triage nurse.  Should you have questions after your visit or need to cancel or reschedule your appointment, please contact [facility] - A DEPT OF [facility]  [phone] and follow the prompts.  Office hours are 8:00 a.m. to 4:30 p.m. Monday - Friday. Please note that voicemails left after 4:00 p.m. may not be returned until the following business day.  We are closed weekends and major holidays. You have access to a nurse at all times for urgent questions. Please call the main number to the clinic [phone] and follow the prompts.  For any non-urgent questions, you may also contact your provider using MyChart. We now offer e-Visits for anyone 71 and older to request care online for non-urgent symptoms. For details visit [email or web address].   Also download the MyChart app! Go to the app store, search MyChart, open the app, select [facility], and log in with your MyChart username and password.

## 2023-12-17 NOTE — Progress Notes (Signed)
 Hematology/Oncology Consult note [facility]  Telephone:([phone] [phone]  Patient Care Team: Justus Leita DEL, MD as PCP - General (Internal Medicine) Verdene Gills, RN as Oncology Nurse Navigator Rennie Cindy SAUNDERS, MD as Consulting Physician (Oncology) Melanee Annah BROCKS, MD as Consulting Physician (Oncology)   Name of the patient: Vicki Phillips  [id number]  27-Jan-1966   Date of visit: 12/17/23  Diagnosis-metastatic cholangiocarcinoma  Chief complaint/ Reason for visit-on treatment assessment prior to cycle 4-day 1 of gemcitabine  Abraxane  chemotherapy  Heme/Onc history:  Oncology History Overview Note   Cisplatin  25 mg/m/gemcitabine  1000 mg/m/Durvalumab  -completed 8 cycles on 03/04/2023 Maintenance Durvalumab /gemcitabine -started 03/26/2023   ASSESSMENT & PLAN:  Vicki Phillips 59 y.o. 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   # Intrahepatic Cholangiocarcinoma, stage IV - s/p liver biopsy on 08/26/2022 showed metastatic moderately differentiated adenocarcinoma.  IHC stain positive for CK7 and CK20.  TTF-1, PAX8, GATA3 and CDX2 are negative.  Differentials include cholangiocarcinoma and pancreatic carcinoma.    -PD-L1 0%.  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   -s/p cisplatin , gemcitabine  and Durvalumab  every 3 weeks for 8 cycles.  Then on maintenance Durvalumab  and gemcitabine  until March 2025. -With rising CA 19-9, CT chest abdomen pelvis was done which unfortunately showed progression of the lung nodules.   - Based on FGFR 2 gene rearrangement, plan was to start futibatinib  16 mg.  Patient decided to start at full dose 20 mg on 06/04/2023.  Phosphorus is elevated to 7.5.  Dosing guidelines reviewed.  Will decrease dose to 16 mg once daily.  Weekly phosphorus level check.  Started on PhosLo  2 tabs with every meals.  If  phosphorus level goes below 7 in the next 2 weeks, we will continue with futibatinib  16 mg once daily.   # JUNE 2025- HOLD futibatinib  sec to myalgias/arthralgias/ocular symptoms  # # JULY 14th, 2025-  Innumerable metastatic lung nodules with overall increase in the size of the nodules; Ill-defined mass in the left lobe of the liver with increase in the calcifications-given the progressive disease recommend gemcitabine  Abraxane  chemotherapy [steroid sparing].   # JULY 25th, 2025- Proceed with cycle #1-gemcitabine  Abraxane  every 2 weeks;  # MAY 19th, 2025- LEFT DVT- on eliquis  [name] ]     Intrahepatic cholangiocarcinoma (HCC)  08/26/2022 Cancer Staging   Staging form: Intrahepatic Bile Duct, AJCC 8th Edition - Clinical stage from 08/26/2022: Stage IV (cM1) - Signed by Clista Bimler, MD on 09/03/2022 Stage prefix: Initial diagnosis   09/03/2022 Initial Diagnosis   Cholangiocarcinoma (HCC)   09/18/2022 - 04/30/2023 Chemotherapy   Patient is on Treatment Plan : BILIARY TRACT Cisplatin  + Gemcitabine  D1,8 + Durvalumab  (1500) D1 q21d / Durvalumab  (1500) q28d     09/17/2023 -  Chemotherapy   Patient is on Treatment Plan : PANCREATIC Abraxane  D1,8,15 + Gemcitabine  D1,8,15 q28d     Metastasis to lung (HCC)  09/03/2022 Initial Diagnosis   Metastasis to lung (HCC)   09/18/2022 - 04/30/2023 Chemotherapy   Patient is on Treatment Plan : BILIARY TRACT Cisplatin  + Gemcitabine  D1,8 + Durvalumab  (1500) D1 q21d / Durvalumab  (1500) q28d     09/17/2023 -  Chemotherapy   Patient is on Treatment Plan : PANCREATIC Abraxane  D1,8,15 + Gemcitabine  D1,8,15 q28d        Interval history- Discussed the use of AI scribe software for clinical note transcription with the patient, who gave verbal consent to proceed.  Vicki Phillips is a 58 year old female with cholangiocarcinoma who presents for follow-up after a recent CT scan. She experiences worsening neuropathy, particularly in the three middle toes on  both feet, which has become more pronounced. There is tingling in her fingers but no significant neuropathy in her hands. The neuropathy does not currently affect her balance or daily activities. She is not taking any medication for neuropathy at this time.  Her current treatment schedule involves receiving gemcitabine  and Abraxane  every other week, along with a white count boosting shot with each treatment. Recent blood work shows a white blood cell count of 3.2 and neutrophil levels above 1. Liver enzyme levels (AST and ALT) have been fluctuating slightly above normal limits.  She has a past medical history of deep vein thrombosis (DVT) and is on Eliquis  2.5 mg twice daily. She has run out of Eliquis  and is awaiting a refill.       ECOG PS- 0 Pain scale- 2 Opioid associated constipation- no  Review of systems- Review of Systems  Constitutional:  Positive for malaise/fatigue. Negative for chills, fever and weight loss.  HENT:  Negative for congestion, ear discharge and nosebleeds.   Eyes:  Negative for blurred vision.  Respiratory:  Negative for cough, hemoptysis, sputum production, shortness of breath and wheezing.   Cardiovascular:  Negative for chest pain, palpitations, orthopnea and claudication.  Gastrointestinal:  Negative for abdominal pain, blood in stool, constipation, diarrhea, heartburn, melena, nausea and vomiting.  Genitourinary:  Negative for dysuria, flank pain, frequency, hematuria and urgency.  Musculoskeletal:  Negative for back pain, joint pain and myalgias.  Skin:  Negative for rash.  Neurological:  Positive for sensory change (Peripheral neuropathy). Negative for dizziness, tingling, focal weakness, seizures, weakness and headaches.  Endo/Heme/Allergies:  Does not bruise/bleed easily.  Psychiatric/Behavioral:  Negative for depression and suicidal ideas. The patient does not have insomnia.       Allergies  Allergen Reactions   Other Cough    Hay fever     Past  Medical History:  Diagnosis Date   Allergy    Bile duct cancer (HCC)    Sarcoidosis 2011     Past Surgical History:  Procedure Laterality Date   CESAREAN SECTION     x 2   COLONOSCOPY WITH PROPOFOL  N/A 08/10/2019   Procedure: COLONOSCOPY WITH PROPOFOL ;  Surgeon: Jinny Carmine, MD;  Location: [facility];  Service: Endoscopy;  Laterality: N/A;  priority 4   IR IMAGING GUIDED PORT INSERTION  09/11/2022   LUNG BIOPSY      Social History   Socioeconomic History   Marital status: Single    Spouse name: Not on file   Number of children: 2   Years of education: Not on file   Highest education level: Not on file  Occupational History   Not on file  Tobacco Use   Smoking status: Never   Smokeless tobacco: Never  Vaping Use   Vaping status: Never Used  Substance and Sexual Activity   Alcohol use: Not Currently    Alcohol/week: 4.0 standard drinks of alcohol    Types: 4 Glasses of wine per week   Drug use: No   Sexual activity: Yes    Partners: Male    Birth control/protection: Surgical  Other Topics Concern   Not on file  Social History Narrative   Not on file   Social Drivers of Health   Financial Resource Strain: Low Risk  (09/18/2022)   Overall Physicist, medical Strain (  CARDIA)    Difficulty of Paying Living Expenses: Not hard at all  Food Insecurity: No Food Insecurity (08/24/2022)   Hunger Vital Sign    Worried About Running Out of Food in the Last Year: Never true    Ran Out of Food in the Last Year: Never true  Transportation Needs: No Transportation Needs (08/24/2022)   PRAPARE - Administrator, Civil Service (Medical): No    Lack of Transportation (Non-Medical): No  Physical Activity: Not on file  Stress: Not on file  Social Connections: Not on file  Intimate Partner Violence: Not At Risk (08/24/2022)   Humiliation, Afraid, Rape, and Kick questionnaire    Fear of Current or Ex-Partner: No    Emotionally Abused: No    Physically Abused: No     Sexually Abused: No    Family History  Problem Relation Age of Onset   Stroke Mother    Heart disease Mother    Heart attack Father 43   Heart disease Father    Kidney disease Sister      Current Outpatient Medications:    amLODipine  (NORVASC ) 5 MG tablet, Take 1 tablet (5 mg total) by mouth daily., Disp: 90 tablet, Rfl: 1   diphenoxylate -atropine  (LOMOTIL ) 2.5-0.025 MG tablet, Take 1 tablet by mouth 4 (four) times daily as needed for diarrhea or loose stools. Take it along with immodium, Disp: 60 tablet, Rfl: 0   loratadine (CLARITIN) 10 MG tablet, Take 10 mg by mouth daily., Disp: , Rfl:    OLANZapine  (ZYPREXA ) 10 MG tablet, TAKE 1 TABLET(10 MG) BY MOUTH AT BEDTIME, Disp: 30 tablet, Rfl: 0   ondansetron  (ZOFRAN ) 8 MG tablet, Take 1 tablet (8 mg total) by mouth every 8 (eight) hours as needed for nausea or vomiting., Disp: 90 tablet, Rfl: 0   Perfluorohexyloctane (MIEBO) 1.338 GM/ML SOLN, Apply 1 drop to eye daily., Disp: , Rfl:    prochlorperazine  (COMPAZINE ) 10 MG tablet, Take 1 tablet (10 mg total) by mouth every 6 (six) hours as needed for nausea or vomiting., Disp: 30 tablet, Rfl: 2   apixaban  (ELIQUIS ) 5 MG TABS tablet, Take 1 tablet (5 mg total) by mouth 2 (two) times daily., Disp: 60 tablet, Rfl: 5   benzonatate  (TESSALON ) 100 MG capsule, Take 1 capsule (100 mg total) by mouth 3 (three) times daily as needed for cough. (Patient not taking: Reported on 12/17/2023), Disp: 60 capsule, Rfl: 0 No current facility-administered medications for this visit.  Facility-Administered Medications Ordered in Other Visits:    0.9 %  sodium chloride  infusion, , Intravenous, Continuous, Brahmanday, Govinda R, MD, Stopped at 12/17/23 1200  Physical exam:  Vitals:   12/17/23 0832  BP: (!) 120/92  Pulse: 90  Resp: 17  Temp: 98.6 F (37 C)  SpO2: 97%  Weight: 225 lb 4.8 oz (102.2 kg)  Height: 5' 9 (1.753 m)   Physical Exam Cardiovascular:     Rate and Rhythm: Normal rate and regular  rhythm.     Heart sounds: Normal heart sounds.  Pulmonary:     Effort: Pulmonary effort is normal.     Breath sounds: Normal breath sounds.  Skin:    General: Skin is warm and dry.  Neurological:     Mental Status: She is alert and oriented to person, place, and time.      I have personally reviewed labs listed below:    Latest Ref Rng & Units 12/17/2023    8:38 AM  CMP  Glucose 70 -  99 mg/dL 899   BUN 6 - 20 mg/dL 10   Creatinine 9.55 - 1.00 mg/dL 9.24   Sodium 864 - 854 mmol/L 138   Potassium 3.5 - 5.1 mmol/L 3.7   Chloride 98 - 111 mmol/L 106   CO2 22 - 32 mmol/L 22   Calcium  8.9 - 10.3 mg/dL 8.8   Total Protein 6.5 - 8.1 g/dL 7.0   Total Bilirubin 0.0 - 1.2 mg/dL 0.8   Alkaline Phos 38 - 126 U/L 104   AST 15 - 41 U/L 66   ALT 0 - 44 U/L 56       Latest Ref Rng & Units 12/17/2023    8:38 AM  CBC  WBC 4.0 - 10.5 K/uL 3.2   Hemoglobin 12.0 - 15.0 g/dL 88.8   Hematocrit 63.9 - 46.0 % 33.0   Platelets 150 - 400 K/uL 251    I have personally reviewed Radiology images listed below: No images are attached to the encounter.  CT CHEST ABDOMEN PELVIS W CONTRAST Result Date: 12/16/2023 CLINICAL DATA:  History of cholangiocarcinoma, follow-up. * Tracking Code: BO * EXAM: CT CHEST, ABDOMEN, AND PELVIS WITH CONTRAST TECHNIQUE: Multidetector CT imaging of the chest, abdomen and pelvis was performed following the standard protocol during bolus administration of intravenous contrast. RADIATION DOSE REDUCTION: This exam was performed according to the departmental dose-optimization program which includes automated exposure control, adjustment of the mA and/or kV according to patient size and/or use of iterative reconstruction technique. CONTRAST:  ISOVUE-300 IOPAMIDOL (ISOVUE-300) INJECTION 61% COMPARISON:  Multiple priors including CT September 02, 2023 FINDINGS: CT CHEST FINDINGS Cardiovascular: Accessed right chest Port-A-Cath with the tip in the right atrium. Normal caliber thoracic  aorta. Normal size heart. No significant pericardial effusion/thickening. Mediastinum/Nodes: No suspicious thyroid  nodule. No pathologically enlarged mediastinal, hilar or axillary lymph nodes. The esophagus is grossly unremarkable. Small hiatal hernia. Lungs/Pleura: Overall decrease in size in the numerous bilateral solid pulmonary nodules. For reference: -right middle lobe pulmonary nodule measures 14 mm on image 101/3 previously 18 mm -right upper lobe pulmonary nodule measures 15 mm on image 67/3 previously 18 mm. No definite new suspicious pulmonary nodules or masses. Musculoskeletal: Sclerotic osseous metastasis in the T3 vertebral body is similar prior examinations. Unchanged sclerotic lesion in the left T1 pedicle. No new suspicious osseous lesion. CT ABDOMEN PELVIS FINDINGS Hepatobiliary: Ill-defined partially calcified mass in the left lobe of the liver with overlying capsular retraction measures approximately 6.1 x 5.0 cm on image 61/2 previously 6.5 x 5.7 cm when remeasured for consistency. No new suspicious hepatic lesion identified. Gallbladder is unremarkable.  No biliary ductal dilation. Pancreas: No pancreatic ductal dilation or evidence of acute inflammation. Spleen: No splenomegaly. Adrenals/Urinary Tract: No suspicious adrenal nodule/mass. No hydronephrosis. Kidneys demonstrate symmetric enhancement. Urinary bladder is unremarkable for degree of distension. Stomach/Bowel: Stomach is unremarkable for degree of distension. No pathologic dilation of small or large bowel. Noninflamed appendix. Colonic diverticulosis. Vascular/Lymphatic: Normal caliber abdominal aorta. Smooth IVC contours. The portal, splenic and superior mesenteric veins are patent. No pathologically enlarged abdominal or pelvic lymph nodes. Reproductive: Uterus and bilateral adnexa are unremarkable. Other: No significant abdominopelvic free fluid. Musculoskeletal: New tiny sclerotic focus in the anterior aspect of the T4 vertebral  body on image 92/5. Lumbar spondylosis.  Degenerative change of the bilateral hips. IMPRESSION: 1. Overall decrease in size of the numerous bilateral solid pulmonary nodules. 2. Slight interval decrease in size of the ill-defined partially calcified mass in the left lobe of the  liver with overlying capsular retraction. 3. New tiny sclerotic focus in the anterior aspect of the T4 vertebral body, nonspecific but possibly reflecting a new osseous metastasis. 4. Similar appearance of the sclerotic osseous metastasis in the T3 vertebral body and left T1 pedicle. 5. Colonic diverticulosis without evidence of acute diverticulitis. Electronically Signed   By: Reyes Holder M.D.   On: 12/16/2023 05:01     Assessment and plan- Patient is a 58 y.o. female with history of stage IV intrahepatic cholangiocarcinoma here for on treatment assessment prior to cycle 4-day 1 of gemcitabine  Abraxane  chemotherapy      Metastatic intrahepatic cholangiocarcinoma (liver and lung involvement) Metastatic intrahepatic cholangiocarcinoma with liver and lung involvement. Initially treated with gemcitabine , cisplatin , and avelumab regimen, switched to gemcitabine  and abraxane  upon progression.  I have reviewed CT chest abdomen pelvis images independently and discussed findings with the patient.  Recent CT scan shows decreased size of lung nodules and shrinkage of liver mass, indicating favorable response. New finding of possible uptake in T4 vertebral body, but no pain reported. T3 vertebral body is stable. Liver enzymes (AST and ALT) slightly elevated but not contraindicating treatment. - Continue gemcitabine  and abraxane  regimen every other week. - Administer white count boosting shot with each treatment. - Monitor T4 vertebral body uptake on future scans. - Continue monitoring liver function tests.  Chemotherapy-induced peripheral neuropathy Worsening chemotherapy-induced peripheral neuropathy, primarily affecting three middle  toes on both feet, with tingling in fingers. Neuropathy more pronounced but does not affect daily activities or balance. Not currently taking medication for neuropathy and prefers not to start medications like gabapentin or antidepressants due to previous experiences and side effects. - Discuss potential neuropathy management options with Doctor Mardella in two weeks.  History of deep vein thrombosis on anticoagulation Deep vein thrombosis managed with Eliquis  2.5 mg twice daily as advised by vascular surgeons. Ran out of Eliquis  and unable to obtain refill from pharmacy. - Refill Eliquis  prescription.         Visit Diagnosis 1. Intrahepatic cholangiocarcinoma (HCC)   2. Malignant neoplasm metastatic to both lungs [facility])      Dr. Annah Skene, MD, MPH [facility] at [facility] [phone] 12/17/2023 12:13 PM

## 2023-12-17 NOTE — Progress Notes (Signed)
 Outbound call to [facility] [place] [phone]; patient states she was unable to pick up Olanzapine  approximately one week ago saying she needed to wait 5 days.  Spoke to [place] was sent to [facility] center, it's coming in on the truck today after 2pm.  Patient should receive text messages.  Also refill sent for Eliquis  to same pharmacy

## 2023-12-17 NOTE — Telephone Encounter (Signed)
 Labs drawn for Liquid Bx Tempus today. Kit shipped via [employer].

## 2023-12-19 ENCOUNTER — Other Ambulatory Visit: Payer: Self-pay

## 2023-12-20 ENCOUNTER — Inpatient Hospital Stay

## 2023-12-20 DIAGNOSIS — C221 Intrahepatic bile duct carcinoma: Secondary | ICD-10-CM

## 2023-12-20 DIAGNOSIS — C7802 Secondary malignant neoplasm of left lung: Secondary | ICD-10-CM

## 2023-12-20 MED ORDER — PEGFILGRASTIM-JMDB 6 MG/0.6ML ~~LOC~~ SOSY
6.0000 mg | PREFILLED_SYRINGE | Freq: Once | SUBCUTANEOUS | Status: AC
  Administered 2023-12-20: 6 mg via SUBCUTANEOUS
  Filled 2023-12-20: qty 0.6

## 2023-12-21 ENCOUNTER — Encounter: Payer: Self-pay | Admitting: Internal Medicine

## 2023-12-24 ENCOUNTER — Encounter: Payer: Self-pay | Admitting: Internal Medicine

## 2023-12-24 NOTE — Telephone Encounter (Signed)
 Encounter opened in error.

## 2023-12-31 ENCOUNTER — Inpatient Hospital Stay: Attending: Internal Medicine

## 2023-12-31 ENCOUNTER — Encounter: Payer: Self-pay | Admitting: Internal Medicine

## 2023-12-31 ENCOUNTER — Inpatient Hospital Stay

## 2023-12-31 ENCOUNTER — Inpatient Hospital Stay (HOSPITAL_BASED_OUTPATIENT_CLINIC_OR_DEPARTMENT_OTHER): Admitting: Internal Medicine

## 2023-12-31 DIAGNOSIS — C221 Intrahepatic bile duct carcinoma: Secondary | ICD-10-CM | POA: Insufficient documentation

## 2023-12-31 DIAGNOSIS — G629 Polyneuropathy, unspecified: Secondary | ICD-10-CM | POA: Insufficient documentation

## 2023-12-31 DIAGNOSIS — D869 Sarcoidosis, unspecified: Secondary | ICD-10-CM | POA: Insufficient documentation

## 2023-12-31 DIAGNOSIS — C7802 Secondary malignant neoplasm of left lung: Secondary | ICD-10-CM

## 2023-12-31 DIAGNOSIS — C7951 Secondary malignant neoplasm of bone: Secondary | ICD-10-CM | POA: Insufficient documentation

## 2023-12-31 DIAGNOSIS — I10 Essential (primary) hypertension: Secondary | ICD-10-CM | POA: Insufficient documentation

## 2023-12-31 DIAGNOSIS — C7800 Secondary malignant neoplasm of unspecified lung: Secondary | ICD-10-CM | POA: Diagnosis not present

## 2023-12-31 DIAGNOSIS — Z5111 Encounter for antineoplastic chemotherapy: Secondary | ICD-10-CM | POA: Insufficient documentation

## 2023-12-31 DIAGNOSIS — Z79899 Other long term (current) drug therapy: Secondary | ICD-10-CM | POA: Insufficient documentation

## 2023-12-31 LAB — CBC WITH DIFFERENTIAL (CANCER CENTER ONLY)
Abs Immature Granulocytes: 0.07 K/uL (ref 0.00–0.07)
Basophils Absolute: 0 K/uL (ref 0.0–0.1)
Basophils Relative: 0 %
Eosinophils Absolute: 0.1 K/uL (ref 0.0–0.5)
Eosinophils Relative: 2 %
HCT: 32.3 % — ABNORMAL LOW (ref 36.0–46.0)
Hemoglobin: 10.9 g/dL — ABNORMAL LOW (ref 12.0–15.0)
Immature Granulocytes: 1 %
Lymphocytes Relative: 10 %
Lymphs Abs: 0.6 K/uL — ABNORMAL LOW (ref 0.7–4.0)
MCH: 42.6 pg — ABNORMAL HIGH (ref 26.0–34.0)
MCHC: 33.7 g/dL (ref 30.0–36.0)
MCV: 126.2 fL — ABNORMAL HIGH (ref 80.0–100.0)
Monocytes Absolute: 0.2 K/uL (ref 0.1–1.0)
Monocytes Relative: 4 %
Neutro Abs: 4.7 K/uL (ref 1.7–7.7)
Neutrophils Relative %: 83 %
Platelet Count: 156 K/uL (ref 150–400)
RBC: 2.56 MIL/uL — ABNORMAL LOW (ref 3.87–5.11)
RDW: 16.1 % — ABNORMAL HIGH (ref 11.5–15.5)
WBC Count: 5.7 K/uL (ref 4.0–10.5)
nRBC: 0 % (ref 0.0–0.2)

## 2023-12-31 LAB — CMP (CANCER CENTER ONLY)
ALT: 41 U/L (ref 0–44)
AST: 51 U/L — ABNORMAL HIGH (ref 15–41)
Albumin: 3.8 g/dL (ref 3.5–5.0)
Alkaline Phosphatase: 119 U/L (ref 38–126)
Anion gap: 8 (ref 5–15)
BUN: 11 mg/dL (ref 6–20)
CO2: 23 mmol/L (ref 22–32)
Calcium: 8.8 mg/dL — ABNORMAL LOW (ref 8.9–10.3)
Chloride: 108 mmol/L (ref 98–111)
Creatinine: 0.74 mg/dL (ref 0.44–1.00)
GFR, Estimated: >60 mL/min (ref >60)
Glucose, Bld: 99 mg/dL (ref 70–99)
Potassium: 3.7 mmol/L (ref 3.5–5.1)
Sodium: 139 mmol/L (ref 135–145)
Total Bilirubin: 0.7 mg/dL (ref 0.0–1.2)
Total Protein: 7.1 g/dL (ref 6.5–8.1)

## 2023-12-31 MED ORDER — SODIUM CHLORIDE 0.9 % IV SOLN
INTRAVENOUS | Status: DC
  Filled 2023-12-31: qty 250

## 2023-12-31 MED ORDER — ONDANSETRON HCL 8 MG PO TABS: 8.0000 mg | ORAL_TABLET | Freq: Three times a day (TID) | ORAL | 0 refills | Status: AC | PRN

## 2023-12-31 MED ORDER — PACLITAXEL PROTEIN-BOUND CHEMO INJECTION 100 MG
125.0000 mg/m2 | Freq: Once | INTRAVENOUS | Status: AC
  Administered 2023-12-31: 300 mg via INTRAVENOUS
  Filled 2023-12-31: qty 60

## 2023-12-31 MED ORDER — PROCHLORPERAZINE MALEATE 10 MG PO TABS
10.0000 mg | ORAL_TABLET | Freq: Once | ORAL | Status: AC
  Administered 2023-12-31: 10 mg via ORAL
  Filled 2023-12-31: qty 1

## 2023-12-31 MED ORDER — SODIUM CHLORIDE 0.9 % IV SOLN
1000.0000 mg/m2 | Freq: Once | INTRAVENOUS | Status: AC
  Administered 2023-12-31: 2242 mg via INTRAVENOUS
  Filled 2023-12-31: qty 58.97

## 2023-12-31 NOTE — Assessment & Plan Note (Addendum)
#   Intrahepatic Cholangiocarcinoma, stage IV- - s/p liver biopsy on 08/26/2022 showed metastatic moderately differentiated adenocarcinoma.  IHC stain positive for CK7 and CK20.  TTF-1, PAX8, GATA3 and CDX2 are negative.  Differentials include cholangiocarcinoma and pancreatic carcinoma. -PD-L1 0%.  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 stage IV intrahepatic cholangiocarcinoma- # JULY 14th, 2025-  Innumerable metastatic lung nodules with overall increase in the size of the nodules; Ill-defined mass in the left lobe of the liver with increase in the calcifications-given the progressive disease recommend gemcitabine  Abraxane  chemotherapy [steroid sparing]. # JULY 25th, 2025- START GEM-ABRAXANE  q 2 W- with GCSF. Blood Tempus- OCT 2025-FGFR alteration noted; IHC HER2 negative otherwise no new mutations noted.  # CT- ordered for 10/21- Overall decrease in size of the numerous bilateral solid pulmonary nodules; Slight interval decrease in size of the ill-defined partially calcified mass in the left lobe of the liver with overlying capsular retraction;  New tiny sclerotic focus in the anterior aspect of the T4 vertebral body, nonspecific but possibly reflecting a new osseous metastasis;  Similar appearance of the sclerotic osseous metastasis in the T3 vertebral body and left T1 pedicle.  # Proceed with traetment #8 gemcitabine  Abraxane  every 2 weeks; Labs-CBC/chemistries were reviewed with the patient.   # weight loss/poor taste- prior  evaluation nutrition/[place]oli re: weight loss- currently on olanzapine -   #  PN- tingling/ or numbness-recommend Cold socks/ mittents-   # MAY 19th, 2025-  DVT of the left lower extremity-  Eliquis  5 mg twice daily for 6 months; and then recommend 2.5 mg twice daily indefinitely given her underlying cholangiocarcinoma. Stable.   # Diarrhea: G-1- -Chronic.  Imodium/Lomotil  as needed- Stable.   # Cushingoid features: -Likely  iatrogenic from Decadron  use with chemotherapy. -Improving since off steroid.  Will monitor for now- Stable.  # Hypertension: -Continue with amlodipine  5 mg daily. Stable; refilled.Stable.   # Cough: -ongoing- ? Sec to mets- Continue with Tussionex as needed.  Declines any refills at this time.  called in tessalon  pearls- Stable.   # History of sarcoidosis- -Management per pulmonary; -Never required treatment.  # IV access; port flush q 2 M  # JRE:inwz-  # Disability: wants to go back to work- ok to check with employer  PS-  # DISPOSITION:  # chemo today; D-2 injection # as planned- in 2  week-MD labs CBC CMP; CA 19-9;  chemo; D-3 injection # follow up  in 4  week-MD- labs CBC CMP; CA 19-9; chemo - ; D-3 injection- Dr.B

## 2023-12-31 NOTE — Patient Instructions (Signed)
 [facility] - A DEPT OF [facility]  Discharge Instructions: Thank you for choosing [facility] to provide your oncology and hematology care.  If you have a lab appointment with the Cancer Center, please go directly to the Cancer Center and check in at the registration area.  Wear comfortable clothing and clothing appropriate for easy access to any Portacath or PICC line.   We strive to give you quality time with your provider. You may need to reschedule your appointment if you arrive late (15 or more minutes).  Arriving late affects you and other patients whose appointments are after yours.  Also, if you miss three or more appointments without notifying the office, you may be dismissed from the clinic at the provider's discretion.      For prescription refill requests, have your pharmacy contact our office and allow 72 hours for refills to be completed.    Today you received the following chemotherapy and/or immunotherapy agents Abraxane and Gemzar   To help prevent nausea and vomiting after your treatment, we encourage you to take your nausea medication as directed.  BELOW ARE SYMPTOMS THAT SHOULD BE REPORTED IMMEDIATELY: *FEVER GREATER THAN 100.4 F (38 C) OR HIGHER *CHILLS OR SWEATING *NAUSEA AND VOMITING THAT IS NOT CONTROLLED WITH YOUR NAUSEA MEDICATION *UNUSUAL SHORTNESS OF BREATH *UNUSUAL BRUISING OR BLEEDING *URINARY PROBLEMS (pain or burning when urinating, or frequent urination) *BOWEL PROBLEMS (unusual diarrhea, constipation, pain near the anus) TENDERNESS IN MOUTH AND THROAT WITH OR WITHOUT PRESENCE OF ULCERS (sore throat, sores in mouth, or a toothache) UNUSUAL RASH, SWELLING OR PAIN  UNUSUAL VAGINAL DISCHARGE OR ITCHING   Items with * indicate a potential emergency and should be followed up as soon as possible or go to the Emergency Department if any problems should occur.  Please show the CHEMOTHERAPY ALERT CARD or  IMMUNOTHERAPY ALERT CARD at check-in to the Emergency Department and triage nurse.  Should you have questions after your visit or need to cancel or reschedule your appointment, please contact [facility] - A DEPT OF [facility]  [phone] and follow the prompts.  Office hours are 8:00 a.m. to 4:30 p.m. Monday - Friday. Please note that voicemails left after 4:00 p.m. may not be returned until the following business day.  We are closed weekends and major holidays. You have access to a nurse at all times for urgent questions. Please call the main number to the clinic [phone] and follow the prompts.  For any non-urgent questions, you may also contact your provider using MyChart. We now offer e-Visits for anyone 28 and older to request care online for non-urgent symptoms. For details visit [email or web address].   Also download the MyChart app! Go to the app store, search "MyChart", open the app, select , and log in with your MyChart username and password.

## 2023-12-31 NOTE — Progress Notes (Signed)
 Needs refill on zofran , pended.  C/o stinging and numbness getting worse in feet and toes.

## 2023-12-31 NOTE — Progress Notes (Signed)
 [facility] CONSULT NOTE  Patient Care Team: Justus Leita DEL, MD as PCP - General (Internal Medicine) Verdene Gills, RN as Oncology Nurse Navigator Rennie Cindy SAUNDERS, MD as Consulting Physician (Oncology) Melanee Annah BROCKS, MD as Consulting Physician (Oncology)  CHIEF COMPLAINTS/PURPOSE OF CONSULTATION: cholangiocarcinoma  Oncology History Overview Note   Cisplatin  25 mg/m/gemcitabine  1000 mg/m/Durvalumab  -completed 8 cycles on 03/04/2023 Maintenance Durvalumab /gemcitabine -started 03/26/2023   ASSESSMENT & PLAN:  Vicki Phillips 58 y.o. 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   # Intrahepatic Cholangiocarcinoma, stage IV - s/p liver biopsy on 08/26/2022 showed metastatic moderately differentiated adenocarcinoma.  IHC stain positive for CK7 and CK20.  TTF-1, PAX8, GATA3 and CDX2 are negative.  Differentials include cholangiocarcinoma and pancreatic carcinoma.    -PD-L1 0%.  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   -s/p cisplatin , gemcitabine  and Durvalumab  every 3 weeks for 8 cycles.  Then on maintenance Durvalumab  and gemcitabine  until March 2025. -With rising CA 19-9, CT chest abdomen pelvis was done which unfortunately showed progression of the lung nodules.   - Based on FGFR 2 gene rearrangement, plan was to start futibatinib  16 mg.  Patient decided to start at full dose 20 mg on 06/04/2023.  Phosphorus is elevated to 7.5.  Dosing guidelines reviewed.  Will decrease dose to 16 mg once daily.  Weekly phosphorus level check.  Started on PhosLo  2 tabs with every meals.  If phosphorus level goes below 7 in the next 2 weeks, we will continue with futibatinib  16 mg once daily.   # JUNE 2025- HOLD futibatinib  sec to myalgias/arthralgias/ocular symptoms  # # JULY 14th, 2025-  Innumerable metastatic lung nodules with overall increase in the size of the nodules;  Ill-defined mass in the left lobe of the liver with increase in the calcifications-given the progressive disease recommend gemcitabine  Abraxane  chemotherapy [steroid sparing].   # JULY 25th, 2025- Proceed with cycle #1-gemcitabine  Abraxane  every 2 weeks;  # MAY 19th, 2025- LEFT DVT- on eliquis  [email or web address] ]     Intrahepatic cholangiocarcinoma (HCC)  08/26/2022 Cancer Staging   Staging form: Intrahepatic Bile Duct, AJCC 8th Edition - Clinical stage from 08/26/2022: Stage IV (cM1) - Signed by Clista Bimler, MD on 09/03/2022 Stage prefix: Initial diagnosis   09/03/2022 Initial Diagnosis   Cholangiocarcinoma (HCC)   09/18/2022 - 04/30/2023 Chemotherapy   Patient is on Treatment Plan : BILIARY TRACT Cisplatin  + Gemcitabine  D1,8 + Durvalumab  (1500) D1 q21d / Durvalumab  (1500) q28d     09/17/2023 -  Chemotherapy   Patient is on Treatment Plan : PANCREATIC Abraxane  D1,8,15 + Gemcitabine  D1,8,15 q28d     Metastasis to lung (HCC)  09/03/2022 Initial Diagnosis   Metastasis to lung (HCC)   09/18/2022 - 04/30/2023 Chemotherapy   Patient is on Treatment Plan : BILIARY TRACT Cisplatin  + Gemcitabine  D1,8 + Durvalumab  (1500) D1 q21d / Durvalumab  (1500) q28d     09/17/2023 -  Chemotherapy   Patient is on Treatment Plan : PANCREATIC Abraxane  D1,8,15 + Gemcitabine  D1,8,15 q28d      HISTORY OF PRESENTING ILLNESS: Patient ambulating-independently. Alone.   Vicki Phillips 58 y.o.  female pleasant patient with a history of intrahepatic stage IV cholangiocarcinoma with metastasis to lungs- MAY 2025-  DVT of the left lower extremity-  Eliquis  on Gem-abraxane  q 2 weeks is here for a follow up/and review the results of the CT scan  Reports joint pain, mostly in fingers after treatment that  last for few day. Resolved by the next treatment. Mild tingling numbness in th toes.   Patient still has  chronic cough.  Not resolved.  Mild shortness of breath on exertion.  No fevers no chills currently. Denies any worsening  leg swelling.  Continues  to be  on eliquis .    No palpitations. Denies any chest pains.   Review of Systems  Constitutional:  Positive for malaise/fatigue. Negative for chills, diaphoresis, fever and weight loss.  HENT:  Negative for nosebleeds and sore throat.   Eyes:  Negative for double vision.  Respiratory:  Negative for cough, hemoptysis, sputum production, shortness of breath and wheezing.   Cardiovascular:  Positive for leg swelling. Negative for chest pain, palpitations and orthopnea.  Gastrointestinal:  Negative for abdominal pain, blood in stool, constipation, diarrhea, heartburn, melena, nausea and vomiting.  Genitourinary:  Negative for dysuria, frequency and urgency.  Musculoskeletal:  Positive for back pain and joint pain.  Skin: Negative.  Negative for itching and rash.  Neurological:  Negative for dizziness, tingling, focal weakness, weakness and headaches.  Endo/Heme/Allergies:  Does not bruise/bleed easily.  Psychiatric/Behavioral:  Negative for depression. The patient is not nervous/anxious and does not have insomnia.     MEDICAL HISTORY:  Past Medical History:  Diagnosis Date   Allergy    Bile duct cancer (HCC)    Sarcoidosis 2011    SURGICAL HISTORY: Past Surgical History:  Procedure Laterality Date   CESAREAN SECTION     x 2   COLONOSCOPY WITH PROPOFOL  N/A 08/10/2019   Procedure: COLONOSCOPY WITH PROPOFOL ;  Surgeon: Jinny Carmine, MD;  Location: [facility];  Service: Endoscopy;  Laterality: N/A;  priority 4   IR IMAGING GUIDED PORT INSERTION  09/11/2022   LUNG BIOPSY      SOCIAL HISTORY: Social History   Socioeconomic History   Marital status: Single    Spouse name: Not on file   Number of children: 2   Years of education: Not on file   Highest education level: Not on file  Occupational History   Not on file  Tobacco Use   Smoking status: Never   Smokeless tobacco: Never  Vaping Use   Vaping status: Never Used  Substance and Sexual  Activity   Alcohol use: Not Currently    Alcohol/week: 4.0 standard drinks of alcohol    Types: 4 Glasses of wine per week   Drug use: No   Sexual activity: Yes    Partners: Male    Birth control/protection: Surgical  Other Topics Concern   Not on file  Social History Narrative   Not on file   Social Drivers of Health   Financial Resource Strain: Low Risk  (09/18/2022)   Overall Financial Resource Strain (CARDIA)    Difficulty of Paying Living Expenses: Not hard at all  Food Insecurity: No Food Insecurity (08/24/2022)   Hunger Vital Sign    Worried About Running Out of Food in the Last Year: Never true    Ran Out of Food in the Last Year: Never true  Transportation Needs: No Transportation Needs (08/24/2022)   PRAPARE - Administrator, Civil Service (Medical): No    Lack of Transportation (Non-Medical): No  Physical Activity: Not on file  Stress: Not on file  Social Connections: Not on file  Intimate Partner Violence: Not At Risk (08/24/2022)   Humiliation, Afraid, Rape, and Kick questionnaire    Fear of Current or Ex-Partner: No    Emotionally Abused: No  Physically Abused: No    Sexually Abused: No    FAMILY HISTORY: Family History  Problem Relation Age of Onset   Stroke Mother    Heart disease Mother    Heart attack Father 41   Heart disease Father    Kidney disease Sister     ALLERGIES:  is allergic to other.  MEDICATIONS:  Current Outpatient Medications  Medication Sig Dispense Refill   amLODipine  (NORVASC ) 5 MG tablet Take 1 tablet (5 mg total) by mouth daily. 90 tablet 1   apixaban  (ELIQUIS ) 5 MG TABS tablet Take 1 tablet (5 mg total) by mouth 2 (two) times daily. 60 tablet 5   benzonatate  (TESSALON ) 100 MG capsule Take 1 capsule (100 mg total) by mouth 3 (three) times daily as needed for cough. 60 capsule 0   diphenoxylate -atropine  (LOMOTIL ) 2.5-0.025 MG tablet Take 1 tablet by mouth 4 (four) times daily as needed for diarrhea or loose stools. Take  it along with immodium 60 tablet 0   loratadine (CLARITIN) 10 MG tablet Take 10 mg by mouth daily.     OLANZapine  (ZYPREXA ) 10 MG tablet TAKE 1 TABLET(10 MG) BY MOUTH AT BEDTIME 30 tablet 0   prochlorperazine  (COMPAZINE ) 10 MG tablet Take 1 tablet (10 mg total) by mouth every 6 (six) hours as needed for nausea or vomiting. 30 tablet 2   ondansetron  (ZOFRAN ) 8 MG tablet Take 1 tablet (8 mg total) by mouth every 8 (eight) hours as needed for nausea or vomiting. 90 tablet 0   No current facility-administered medications for this visit.    PHYSICAL EXAMINATION:   Vitals:   12/31/23 0831 12/31/23 0851  BP: (!) 131/96 (!) 131/101  Pulse: 95   Resp: 16   Temp: (!) 96.5 F (35.8 C)   SpO2: 98%     Filed Weights   12/31/23 0831  Weight: 222 lb 8 oz (100.9 kg)    LEFT LOWER extremity- Swelling and tenderness.   Physical Exam Vitals and nursing note reviewed.  HENT:     Head: Normocephalic and atraumatic.     Mouth/Throat:     Pharynx: Oropharynx is clear.  Eyes:     Extraocular Movements: Extraocular movements intact.     Pupils: Pupils are equal, round, and reactive to light.  Cardiovascular:     Rate and Rhythm: Normal rate and regular rhythm.  Pulmonary:     Comments: Decreased breath sounds bilaterally.  Abdominal:     Palpations: Abdomen is soft.  Musculoskeletal:        General: Normal range of motion.     Cervical back: Normal range of motion.  Skin:    General: Skin is warm.  Neurological:     General: No focal deficit present.     Mental Status: She is alert and oriented to person, place, and time.  Psychiatric:        Behavior: Behavior normal.        Judgment: Judgment normal.     LABORATORY DATA:  I have reviewed the data as listed Lab Results  Component Value Date   WBC 5.7 12/31/2023   HGB 10.9 (L) 12/31/2023   HCT 32.3 (L) 12/31/2023   MCV 126.2 (H) 12/31/2023   PLT 156 12/31/2023   Recent Labs    11/29/23 0906 12/17/23 0838 12/31/23 0833   NA 140 138 139  K 3.8 3.7 3.7  CL 108 106 108  CO2 23 22 23   GLUCOSE 93 100* 99  BUN 10 10 11  CREATININE 0.73 0.75 0.74  CALCIUM  8.8* 8.8* 8.8*  GFRNONAA >60 >60 >60  PROT 6.9 7.0 7.1  ALBUMIN 3.8 3.8 3.8  AST 87* 66* 51*  ALT 73* 56* 41  ALKPHOS 123 104 119  BILITOT 0.7 0.8 0.7    RADIOGRAPHIC STUDIES: I have personally reviewed the radiological images as listed and agreed with the findings in the report. CT CHEST ABDOMEN PELVIS W CONTRAST Result Date: 12/16/2023 CLINICAL DATA:  History of cholangiocarcinoma, follow-up. * Tracking Code: BO * EXAM: CT CHEST, ABDOMEN, AND PELVIS WITH CONTRAST TECHNIQUE: Multidetector CT imaging of the chest, abdomen and pelvis was performed following the standard protocol during bolus administration of intravenous contrast. RADIATION DOSE REDUCTION: This exam was performed according to the departmental dose-optimization program which includes automated exposure control, adjustment of the mA and/or kV according to patient size and/or use of iterative reconstruction technique. CONTRAST:  ISOVUE-300 IOPAMIDOL (ISOVUE-300) INJECTION 61% COMPARISON:  Multiple priors including CT September 02, 2023 FINDINGS: CT CHEST FINDINGS Cardiovascular: Accessed right chest Port-A-Cath with the tip in the right atrium. Normal caliber thoracic aorta. Normal size heart. No significant pericardial effusion/thickening. Mediastinum/Nodes: No suspicious thyroid  nodule. No pathologically enlarged mediastinal, hilar or axillary lymph nodes. The esophagus is grossly unremarkable. Small hiatal hernia. Lungs/Pleura: Overall decrease in size in the numerous bilateral solid pulmonary nodules. For reference: -right middle lobe pulmonary nodule measures 14 mm on image 101/3 previously 18 mm -right upper lobe pulmonary nodule measures 15 mm on image 67/3 previously 18 mm. No definite new suspicious pulmonary nodules or masses. Musculoskeletal: Sclerotic osseous metastasis in the T3 vertebral  body is similar prior examinations. Unchanged sclerotic lesion in the left T1 pedicle. No new suspicious osseous lesion. CT ABDOMEN PELVIS FINDINGS Hepatobiliary: Ill-defined partially calcified mass in the left lobe of the liver with overlying capsular retraction measures approximately 6.1 x 5.0 cm on image 61/2 previously 6.5 x 5.7 cm when remeasured for consistency. No new suspicious hepatic lesion identified. Gallbladder is unremarkable.  No biliary ductal dilation. Pancreas: No pancreatic ductal dilation or evidence of acute inflammation. Spleen: No splenomegaly. Adrenals/Urinary Tract: No suspicious adrenal nodule/mass. No hydronephrosis. Kidneys demonstrate symmetric enhancement. Urinary bladder is unremarkable for degree of distension. Stomach/Bowel: Stomach is unremarkable for degree of distension. No pathologic dilation of small or large bowel. Noninflamed appendix. Colonic diverticulosis. Vascular/Lymphatic: Normal caliber abdominal aorta. Smooth IVC contours. The portal, splenic and superior mesenteric veins are patent. No pathologically enlarged abdominal or pelvic lymph nodes. Reproductive: Uterus and bilateral adnexa are unremarkable. Other: No significant abdominopelvic free fluid. Musculoskeletal: New tiny sclerotic focus in the anterior aspect of the T4 vertebral body on image 92/5. Lumbar spondylosis.  Degenerative change of the bilateral hips. IMPRESSION: 1. Overall decrease in size of the numerous bilateral solid pulmonary nodules. 2. Slight interval decrease in size of the ill-defined partially calcified mass in the left lobe of the liver with overlying capsular retraction. 3. New tiny sclerotic focus in the anterior aspect of the T4 vertebral body, nonspecific but possibly reflecting a new osseous metastasis. 4. Similar appearance of the sclerotic osseous metastasis in the T3 vertebral body and left T1 pedicle. 5. Colonic diverticulosis without evidence of acute diverticulitis. Electronically  Signed   By: Reyes Holder M.D.   On: 12/16/2023 05:01      Intrahepatic cholangiocarcinoma (HCC) # Intrahepatic Cholangiocarcinoma, stage IV- - s/p liver biopsy on 08/26/2022 showed metastatic moderately differentiated adenocarcinoma.  IHC stain positive for CK7 and CK20.  TTF-1, PAX8, GATA3 and CDX2 are  negative.  Differentials include cholangiocarcinoma and pancreatic carcinoma. -PD-L1 0%.  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 stage IV intrahepatic cholangiocarcinoma- # JULY 14th, 2025-  Innumerable metastatic lung nodules with overall increase in the size of the nodules; Ill-defined mass in the left lobe of the liver with increase in the calcifications-given the progressive disease recommend gemcitabine  Abraxane  chemotherapy [steroid sparing]. # JULY 25th, 2025- START GEM-ABRAXANE  q 2 W- with GCSF. Blood Tempus- OCT 2025-FGFR alteration noted; IHC HER2 negative otherwise no new mutations noted.  # CT- ordered for 10/21- Overall decrease in size of the numerous bilateral solid pulmonary nodules; Slight interval decrease in size of the ill-defined partially calcified mass in the left lobe of the liver with overlying capsular retraction;  New tiny sclerotic focus in the anterior aspect of the T4 vertebral body, nonspecific but possibly reflecting a new osseous metastasis;  Similar appearance of the sclerotic osseous metastasis in the T3 vertebral body and left T1 pedicle.  # Proceed with traetment #8 gemcitabine  Abraxane  every 2 weeks; Labs-CBC/chemistries were reviewed with the patient.   # weight loss/poor taste- prior  evaluation nutrition/Joli re: weight loss- currently on olanzapine -   #  PN- tingling/ or numbness-recommend Cold socks/ mittents-   # MAY 19th, 2025-  DVT of the left lower extremity-  Eliquis  5 mg twice daily for 6 months; and then recommend 2.5 mg twice daily indefinitely given her underlying cholangiocarcinoma. Stable.    # Diarrhea: G-1- -Chronic.  Imodium/Lomotil  as needed- Stable.   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 -Improving since off steroid.  Will monitor for now- Stable.  # Hypertension: -Continue with amlodipine  5 mg daily. Stable; refilled.Stable.   # Cough: -ongoing- ? Sec to mets- Continue with Tussionex as needed.  Declines any refills at this time.  called in tessalon  pearls- Stable.   # History of sarcoidosis- -Management per pulmonary; -Never required treatment.  # IV access; port flush q 2 M  # JRE:inwz-  # Disability: wants to go back to work- ok to check with employer  PS-  # DISPOSITION:  # chemo today; D-2 injection # as planned- in 2  week-MD labs CBC CMP; CA 19-9;  chemo; D-3 injection # follow up  in 4  week-MD- labs CBC CMP; CA 19-9; chemo - ; D-3 injection- Dr.B    Above plan of care was discussed with patient/family in detail.  My contact information was given to the patient/family.      Cindy JONELLE Joe, MD 01/02/2024 5:11 PM

## 2024-01-02 ENCOUNTER — Encounter: Payer: Self-pay | Admitting: Internal Medicine

## 2024-01-03 ENCOUNTER — Encounter: Payer: Self-pay | Admitting: Internal Medicine

## 2024-01-03 ENCOUNTER — Inpatient Hospital Stay

## 2024-01-03 DIAGNOSIS — C221 Intrahepatic bile duct carcinoma: Secondary | ICD-10-CM | POA: Diagnosis not present

## 2024-01-03 DIAGNOSIS — C7801 Secondary malignant neoplasm of right lung: Secondary | ICD-10-CM

## 2024-01-03 LAB — CA 19-9 (SERIAL): CA 19-9: 100 U/mL — ABNORMAL HIGH (ref 0–35)

## 2024-01-03 MED ORDER — PEGFILGRASTIM-JMDB 6 MG/0.6ML ~~LOC~~ SOSY
6.0000 mg | PREFILLED_SYRINGE | Freq: Once | SUBCUTANEOUS | Status: AC
  Administered 2024-01-03: 6 mg via SUBCUTANEOUS
  Filled 2024-01-03: qty 0.6

## 2024-01-04 ENCOUNTER — Encounter: Payer: Self-pay | Admitting: Internal Medicine

## 2024-01-04 NOTE — Telephone Encounter (Signed)
 Faxed

## 2024-01-07 ENCOUNTER — Telehealth: Payer: Self-pay

## 2024-01-07 ENCOUNTER — Encounter: Payer: Self-pay | Admitting: Internal Medicine

## 2024-01-07 NOTE — Telephone Encounter (Signed)
 Looking into the billing issue for this patient.

## 2024-01-13 DIAGNOSIS — Z79899 Other long term (current) drug therapy: Secondary | ICD-10-CM | POA: Diagnosis not present

## 2024-01-13 DIAGNOSIS — H35722 Serous detachment of retinal pigment epithelium, left eye: Secondary | ICD-10-CM | POA: Diagnosis not present

## 2024-01-13 DIAGNOSIS — L718 Other rosacea: Secondary | ICD-10-CM | POA: Diagnosis not present

## 2024-01-13 DIAGNOSIS — H16223 Keratoconjunctivitis sicca, not specified as Sjogren's, bilateral: Secondary | ICD-10-CM | POA: Diagnosis not present

## 2024-01-14 ENCOUNTER — Inpatient Hospital Stay (HOSPITAL_BASED_OUTPATIENT_CLINIC_OR_DEPARTMENT_OTHER): Admitting: Oncology

## 2024-01-14 ENCOUNTER — Other Ambulatory Visit: Payer: Self-pay | Admitting: Nurse Practitioner

## 2024-01-14 ENCOUNTER — Encounter: Payer: Self-pay | Admitting: Oncology

## 2024-01-14 ENCOUNTER — Inpatient Hospital Stay

## 2024-01-14 ENCOUNTER — Telehealth: Payer: Self-pay

## 2024-01-14 ENCOUNTER — Other Ambulatory Visit: Payer: Self-pay

## 2024-01-14 ENCOUNTER — Encounter: Payer: Self-pay | Admitting: Internal Medicine

## 2024-01-14 VITALS — BP 120/84 | HR 83

## 2024-01-14 VITALS — BP 137/88 | HR 102 | Temp 97.0°F | Resp 18 | Wt 225.9 lb

## 2024-01-14 DIAGNOSIS — C7800 Secondary malignant neoplasm of unspecified lung: Secondary | ICD-10-CM | POA: Diagnosis not present

## 2024-01-14 DIAGNOSIS — Z5111 Encounter for antineoplastic chemotherapy: Secondary | ICD-10-CM

## 2024-01-14 DIAGNOSIS — Z86718 Personal history of other venous thrombosis and embolism: Secondary | ICD-10-CM

## 2024-01-14 DIAGNOSIS — C7801 Secondary malignant neoplasm of right lung: Secondary | ICD-10-CM

## 2024-01-14 DIAGNOSIS — Z79899 Other long term (current) drug therapy: Secondary | ICD-10-CM | POA: Diagnosis not present

## 2024-01-14 DIAGNOSIS — I10 Essential (primary) hypertension: Secondary | ICD-10-CM | POA: Diagnosis not present

## 2024-01-14 DIAGNOSIS — C221 Intrahepatic bile duct carcinoma: Secondary | ICD-10-CM

## 2024-01-14 DIAGNOSIS — T451X5A Adverse effect of antineoplastic and immunosuppressive drugs, initial encounter: Secondary | ICD-10-CM

## 2024-01-14 DIAGNOSIS — Z95828 Presence of other vascular implants and grafts: Secondary | ICD-10-CM | POA: Diagnosis not present

## 2024-01-14 DIAGNOSIS — G620 Drug-induced polyneuropathy: Secondary | ICD-10-CM

## 2024-01-14 DIAGNOSIS — D869 Sarcoidosis, unspecified: Secondary | ICD-10-CM | POA: Diagnosis not present

## 2024-01-14 DIAGNOSIS — G629 Polyneuropathy, unspecified: Secondary | ICD-10-CM | POA: Diagnosis not present

## 2024-01-14 DIAGNOSIS — C7951 Secondary malignant neoplasm of bone: Secondary | ICD-10-CM | POA: Diagnosis not present

## 2024-01-14 LAB — CMP (CANCER CENTER ONLY)
ALT: 35 U/L (ref 0–44)
AST: 43 U/L — ABNORMAL HIGH (ref 15–41)
Albumin: 3.6 g/dL (ref 3.5–5.0)
Alkaline Phosphatase: 118 U/L (ref 38–126)
Anion gap: 10 (ref 5–15)
BUN: 9 mg/dL (ref 6–20)
CO2: 23 mmol/L (ref 22–32)
Calcium: 8.6 mg/dL — ABNORMAL LOW (ref 8.9–10.3)
Chloride: 106 mmol/L (ref 98–111)
Creatinine: 0.71 mg/dL (ref 0.44–1.00)
GFR, Estimated: >60 mL/min (ref >60)
Glucose, Bld: 112 mg/dL — ABNORMAL HIGH (ref 70–99)
Potassium: 4.1 mmol/L (ref 3.5–5.1)
Sodium: 139 mmol/L (ref 135–145)
Total Bilirubin: 0.5 mg/dL (ref 0.0–1.2)
Total Protein: 6.8 g/dL (ref 6.5–8.1)

## 2024-01-14 LAB — CBC WITH DIFFERENTIAL (CANCER CENTER ONLY)
Abs Immature Granulocytes: 0.07 K/uL (ref 0.00–0.07)
Basophils Absolute: 0 K/uL (ref 0.0–0.1)
Basophils Relative: 0 %
Eosinophils Absolute: 0.1 K/uL (ref 0.0–0.5)
Eosinophils Relative: 1 %
HCT: 32.3 % — ABNORMAL LOW (ref 36.0–46.0)
Hemoglobin: 10.9 g/dL — ABNORMAL LOW (ref 12.0–15.0)
Immature Granulocytes: 1 %
Lymphocytes Relative: 11 %
Lymphs Abs: 0.6 K/uL — ABNORMAL LOW (ref 0.7–4.0)
MCH: 42.6 pg — ABNORMAL HIGH (ref 26.0–34.0)
MCHC: 33.7 g/dL (ref 30.0–36.0)
MCV: 126.2 fL — ABNORMAL HIGH (ref 80.0–100.0)
Monocytes Absolute: 0.3 K/uL (ref 0.1–1.0)
Monocytes Relative: 4 %
Neutro Abs: 4.7 K/uL (ref 1.7–7.7)
Neutrophils Relative %: 83 %
Platelet Count: 153 K/uL (ref 150–400)
RBC: 2.56 MIL/uL — ABNORMAL LOW (ref 3.87–5.11)
RDW: 15.9 % — ABNORMAL HIGH (ref 11.5–15.5)
WBC Count: 5.7 K/uL (ref 4.0–10.5)
nRBC: 0 % (ref 0.0–0.2)

## 2024-01-14 MED ORDER — PROCHLORPERAZINE MALEATE 10 MG PO TABS
10.0000 mg | ORAL_TABLET | Freq: Once | ORAL | Status: AC
  Administered 2024-01-14: 10 mg via ORAL
  Filled 2024-01-14: qty 1

## 2024-01-14 MED ORDER — SODIUM CHLORIDE 0.9 % IV SOLN
INTRAVENOUS | Status: DC
  Filled 2024-01-14: qty 250

## 2024-01-14 MED ORDER — PACLITAXEL PROTEIN-BOUND CHEMO INJECTION 100 MG
125.0000 mg/m2 | Freq: Once | INTRAVENOUS | Status: AC
  Administered 2024-01-14: 300 mg via INTRAVENOUS
  Filled 2024-01-14: qty 60

## 2024-01-14 MED ORDER — SODIUM CHLORIDE 0.9 % IV SOLN
1000.0000 mg/m2 | Freq: Once | INTRAVENOUS | Status: AC
  Administered 2024-01-14: 2242 mg via INTRAVENOUS
  Filled 2024-01-14: qty 58.97

## 2024-01-14 NOTE — Patient Instructions (Signed)
 [facility] - A DEPT OF [facility]  Discharge Instructions: Thank you for choosing [facility] to provide your oncology and hematology care.  If you have a lab appointment with the Cancer Center, please go directly to the Cancer Center and check in at the registration area.  Wear comfortable clothing and clothing appropriate for easy access to any Portacath or PICC line.   We strive to give you quality time with your provider. You may need to reschedule your appointment if you arrive late (15 or more minutes).  Arriving late affects you and other patients whose appointments are after yours.  Also, if you miss three or more appointments without notifying the office, you may be dismissed from the clinic at the provider's discretion.      For prescription refill requests, have your pharmacy contact our office and allow 72 hours for refills to be completed.    Today you received the following chemotherapy and/or immunotherapy agents- abraxane, gemzar      To help prevent nausea and vomiting after your treatment, we encourage you to take your nausea medication as directed.  BELOW ARE SYMPTOMS THAT SHOULD BE REPORTED IMMEDIATELY: *FEVER GREATER THAN 100.4 F (38 C) OR HIGHER *CHILLS OR SWEATING *NAUSEA AND VOMITING THAT IS NOT CONTROLLED WITH YOUR NAUSEA MEDICATION *UNUSUAL SHORTNESS OF BREATH *UNUSUAL BRUISING OR BLEEDING *URINARY PROBLEMS (pain or burning when urinating, or frequent urination) *BOWEL PROBLEMS (unusual diarrhea, constipation, pain near the anus) TENDERNESS IN MOUTH AND THROAT WITH OR WITHOUT PRESENCE OF ULCERS (sore throat, sores in mouth, or a toothache) UNUSUAL RASH, SWELLING OR PAIN  UNUSUAL VAGINAL DISCHARGE OR ITCHING   Items with * indicate a potential emergency and should be followed up as soon as possible or go to the Emergency Department if any problems should occur.  Please show the CHEMOTHERAPY ALERT CARD or  IMMUNOTHERAPY ALERT CARD at check-in to the Emergency Department and triage nurse.  Should you have questions after your visit or need to cancel or reschedule your appointment, please contact [facility] - A DEPT OF [facility]  [phone] and follow the prompts.  Office hours are 8:00 a.m. to 4:30 p.m. Monday - Friday. Please note that voicemails left after 4:00 p.m. may not be returned until the following business day.  We are closed weekends and major holidays. You have access to a nurse at all times for urgent questions. Please call the main number to the clinic [phone] and follow the prompts.  For any non-urgent questions, you may also contact your provider using MyChart. We now offer e-Visits for anyone 38 and older to request care online for non-urgent symptoms. For details visit [email or web address].   Also download the MyChart app! Go to the app store, search "MyChart", open the app, select [place], and log in with your MyChart username and password.

## 2024-01-14 NOTE — Progress Notes (Signed)
 Hematology/Oncology Progress note [phone] [phone]     Patient Care Team: Justus Leita DEL, MD as PCP - General (Internal Medicine) Verdene Gills, RN as Oncology Nurse Navigator Rennie Cindy SAUNDERS, MD as Consulting Physician (Oncology) Melanee Annah BROCKS, MD as Consulting Physician (Oncology)  CHIEF COMPLAINTS/PURPOSE OF CONSULTATION: cholangiocarcinoma  Oncology History Overview Note   Cisplatin  25 mg/m/gemcitabine  1000 mg/m/Durvalumab  -completed 8 cycles on 03/04/2023 Maintenance Durvalumab /gemcitabine -started 03/26/2023   ASSESSMENT & PLAN:  Vicki Phillips 58 y.o. 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   # Intrahepatic Cholangiocarcinoma, stage IV - s/p liver biopsy on 08/26/2022 showed metastatic moderately differentiated adenocarcinoma.  IHC stain positive for CK7 and CK20.  TTF-1, PAX8, GATA3 and CDX2 are negative.  Differentials include cholangiocarcinoma and pancreatic carcinoma.    -PD-L1 0%.  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   -s/p cisplatin , gemcitabine  and Durvalumab  every 3 weeks for 8 cycles.  Then on maintenance Durvalumab  and gemcitabine  until March 2025. -With rising CA 19-9, CT chest abdomen pelvis was done which unfortunately showed progression of the lung nodules.   - Based on FGFR 2 gene rearrangement, plan was to start futibatinib  16 mg.  Patient decided to start at full dose 20 mg on 06/04/2023.  Phosphorus is elevated to 7.5.  Dosing guidelines reviewed.  Will decrease dose to 16 mg once daily.  Weekly phosphorus level check.  Started on PhosLo  2 tabs with every meals.  If phosphorus level goes below 7 in the next 2 weeks, we will continue with futibatinib  16 mg once daily.   # JUNE 2025- HOLD futibatinib  sec to myalgias/arthralgias/ocular symptoms  # # JULY 14th, 2025-  Innumerable metastatic lung nodules with overall  increase in the size of the nodules; Ill-defined mass in the left lobe of the liver with increase in the calcifications-given the progressive disease recommend gemcitabine  Abraxane  chemotherapy [steroid sparing].   # JULY 25th, 2025- Proceed with cycle #1-gemcitabine  Abraxane  every 2 weeks;  # MAY 19th, 2025- LEFT DVT- on eliquis  [email or web address] ]     Intrahepatic cholangiocarcinoma (HCC)  08/26/2022 Cancer Staging   Staging form: Intrahepatic Bile Duct, AJCC 8th Edition - Clinical stage from 08/26/2022: Stage IV (cM1) - Signed by Clista Bimler, MD on 09/03/2022 Stage prefix: Initial diagnosis   09/03/2022 Initial Diagnosis   Cholangiocarcinoma (HCC)   09/18/2022 - 04/30/2023 Chemotherapy   Patient is on Treatment Plan : BILIARY TRACT Cisplatin  + Gemcitabine  D1,8 + Durvalumab  (1500) D1 q21d / Durvalumab  (1500) q28d     09/17/2023 -  Chemotherapy   Patient is on Treatment Plan : PANCREATIC Abraxane  D1,8,15 + Gemcitabine  D1,8,15 q28d     Metastasis to lung (HCC)  09/03/2022 Initial Diagnosis   Metastasis to lung (HCC)   09/18/2022 - 04/30/2023 Chemotherapy   Patient is on Treatment Plan : BILIARY TRACT Cisplatin  + Gemcitabine  D1,8 + Durvalumab  (1500) D1 q21d / Durvalumab  (1500) q28d     09/17/2023 -  Chemotherapy   Patient is on Treatment Plan : PANCREATIC Abraxane  D1,8,15 + Gemcitabine  D1,8,15 q28d      HISTORY OF PRESENTING ILLNESS: Patient ambulating-independently.  Discussed the use of AI scribe software for clinical note transcription with the patient, who gave verbal consent to proceed.    Discussed the use of AI scribe software for clinical note transcription with the patient, who gave verbal consent to proceed.    Vicki Phillips 58 y.o.  female pleasant patient with a history  of intrahepatic stage IV cholangiocarcinoma with metastasis to lungs- MAY 2025-  DVT of the left lower extremity-  Eliquis  on Gem-abraxane  q 2 weeks   She experiences neuropathy in her feet, which worsens  with each chemotherapy session. The neuropathy is described as numbing rather than tingling. She is not currently taking any medication for this symptom.  Her appetite is reduced, but her weight has remained stable. She is currently taking olanzapine . She experiences diarrhea and takes Imodium or Lomotil  as needed, which she finds helpful.  Her cough is stable and unchanged. She has a history of low blood counts following previous treatments  Her GCSF injection was denied by insurance and she plans not to come to next week's injection.   Review of Systems  Constitutional:  Positive for malaise/fatigue. Negative for chills, diaphoresis, fever and weight loss.  HENT:  Negative for nosebleeds and sore throat.   Eyes:  Negative for double vision.  Respiratory:  Negative for cough, hemoptysis, sputum production, shortness of breath and wheezing.   Cardiovascular:  Positive for leg swelling. Negative for chest pain, palpitations and orthopnea.  Gastrointestinal:  Negative for abdominal pain, blood in stool, constipation, diarrhea, heartburn, melena, nausea and vomiting.  Genitourinary:  Negative for dysuria, frequency and urgency.  Musculoskeletal:  Positive for back pain and joint pain.  Skin: Negative.  Negative for itching and rash.  Neurological:  Positive for sensory change. Negative for dizziness, tingling, focal weakness, weakness and headaches.  Endo/Heme/Allergies:  Does not bruise/bleed easily.  Psychiatric/Behavioral:  Negative for depression. The patient is not nervous/anxious and does not have insomnia.     MEDICAL HISTORY:  Past Medical History:  Diagnosis Date   Allergy    Bile duct cancer (HCC)    Sarcoidosis 2011    SURGICAL HISTORY: Past Surgical History:  Procedure Laterality Date   CESAREAN SECTION     x 2   COLONOSCOPY WITH PROPOFOL  N/A 08/10/2019   Procedure: COLONOSCOPY WITH PROPOFOL ;  Surgeon: Jinny Carmine, MD;  Location: [facility];  Service: Endoscopy;   Laterality: N/A;  priority 4   IR IMAGING GUIDED PORT INSERTION  09/11/2022   LUNG BIOPSY      SOCIAL HISTORY: Social History   Socioeconomic History   Marital status: Single    Spouse name: Not on file   Number of children: 2   Years of education: Not on file   Highest education level: Not on file  Occupational History   Not on file  Tobacco Use   Smoking status: Never   Smokeless tobacco: Never  Vaping Use   Vaping status: Never Used  Substance and Sexual Activity   Alcohol use: Not Currently    Alcohol/week: 4.0 standard drinks of alcohol    Types: 4 Glasses of wine per week   Drug use: No   Sexual activity: Yes    Partners: Male    Birth control/protection: Surgical  Other Topics Concern   Not on file  Social History Narrative   Not on file   Social Drivers of Health   Financial Resource Strain: Low Risk  (09/18/2022)   Overall Financial Resource Strain (CARDIA)    Difficulty of Paying Living Expenses: Not hard at all  Food Insecurity: No Food Insecurity (08/24/2022)   Hunger Vital Sign    Worried About Running Out of Food in the Last Year: Never true    Ran Out of Food in the Last Year: Never true  Transportation Needs: No Transportation Needs (08/24/2022)  PRAPARE - Administrator, Civil Service (Medical): No    Lack of Transportation (Non-Medical): No  Physical Activity: Not on file  Stress: Not on file  Social Connections: Not on file  Intimate Partner Violence: Not At Risk (08/24/2022)   Humiliation, Afraid, Rape, and Kick questionnaire    Fear of Current or Ex-Partner: No    Emotionally Abused: No    Physically Abused: No    Sexually Abused: No    FAMILY HISTORY: Family History  Problem Relation Age of Onset   Stroke Mother    Heart disease Mother    Heart attack Father 17   Heart disease Father    Kidney disease Sister     ALLERGIES:  is allergic to other.  MEDICATIONS:  Current Outpatient Medications  Medication Sig Dispense  Refill   amLODipine  (NORVASC ) 5 MG tablet Take 1 tablet (5 mg total) by mouth daily. 90 tablet 1   apixaban  (ELIQUIS ) 5 MG TABS tablet Take 1 tablet (5 mg total) by mouth 2 (two) times daily. 60 tablet 5   benzonatate  (TESSALON ) 100 MG capsule Take 1 capsule (100 mg total) by mouth 3 (three) times daily as needed for cough. 60 capsule 0   diphenoxylate -atropine  (LOMOTIL ) 2.5-0.025 MG tablet Take 1 tablet by mouth 4 (four) times daily as needed for diarrhea or loose stools. Take it along with immodium 60 tablet 0   loratadine (CLARITIN) 10 MG tablet Take 10 mg by mouth daily.     OLANZapine  (ZYPREXA ) 10 MG tablet TAKE 1 TABLET(10 MG) BY MOUTH AT BEDTIME 30 tablet 0   ondansetron  (ZOFRAN ) 8 MG tablet Take 1 tablet (8 mg total) by mouth every 8 (eight) hours as needed for nausea or vomiting. 90 tablet 0   prochlorperazine  (COMPAZINE ) 10 MG tablet Take 1 tablet (10 mg total) by mouth every 6 (six) hours as needed for nausea or vomiting. 30 tablet 2   No current facility-administered medications for this visit.   Facility-Administered Medications Ordered in Other Visits  Medication Dose Route Frequency Provider Last Rate Last Admin   0.9 %  sodium chloride  infusion   Intravenous Continuous Melanee Annah BROCKS, MD 10 mL/hr at 01/14/24 0943 New Bag at 01/14/24 0943   gemcitabine  (GEMZAR ) 2,242 mg in sodium chloride  0.9 % 250 mL chemo infusion  1,000 mg/m2 (Treatment Plan Recorded) Intravenous Once Rao, Archana C, MD       PACLitaxel -protein bound (ABRAXANE ) chemo infusion 300 mg  125 mg/m2 (Treatment Plan Recorded) Intravenous Once Melanee Annah BROCKS, MD        PHYSICAL EXAMINATION:   Vitals:   01/14/24 0858  BP: 137/88  Pulse: (!) 102  Resp: 18  Temp: (!) 97 F (36.1 C)  SpO2: 99%    Filed Weights   01/14/24 0858  Weight: 225 lb 14.4 oz (102.5 kg)    LEFT LOWER extremity- Swelling and tenderness.   Physical Exam Vitals and nursing note reviewed.  HENT:     Head: Normocephalic and  atraumatic.  Eyes:     Extraocular Movements: Extraocular movements intact.     Pupils: Pupils are equal, round, and reactive to light.  Cardiovascular:     Rate and Rhythm: Normal rate and regular rhythm.  Pulmonary:     Comments: Decreased breath sounds bilaterally.  Abdominal:     General: There is no distension.     Palpations: Abdomen is soft.  Musculoskeletal:        General: Normal range of motion.  Cervical back: Normal range of motion.     Right lower leg: No edema.     Left lower leg: No edema.  Skin:    General: Skin is warm.     Findings: No rash.  Neurological:     General: No focal deficit present.     Mental Status: She is alert and oriented to person, place, and time.  Psychiatric:        Mood and Affect: Mood normal.     LABORATORY DATA:  I have reviewed the data as listed Lab Results  Component Value Date   WBC 5.7 01/14/2024   HGB 10.9 (L) 01/14/2024   HCT 32.3 (L) 01/14/2024   MCV 126.2 (H) 01/14/2024   PLT 153 01/14/2024   Recent Labs    12/17/23 0838 12/31/23 0833 01/14/24 0844  NA 138 139 139  K 3.7 3.7 4.1  CL 106 108 106  CO2 22 23 23   GLUCOSE 100* 99 112*  BUN 10 11 9   CREATININE 0.75 0.74 0.71  CALCIUM  8.8* 8.8* 8.6*  GFRNONAA >60 >60 >60  PROT 7.0 7.1 6.8  ALBUMIN 3.8 3.8 3.6  AST 66* 51* 43*  ALT 56* 41 35  ALKPHOS 104 119 118  BILITOT 0.8 0.7 0.5    RADIOGRAPHIC STUDIES: I have personally reviewed the radiological images as listed and agreed with the findings in the report. No results found.   ASSESSMENT & PLAN:   ASSESSMENT & PLAN:   Intrahepatic cholangiocarcinoma (HCC) # Intrahepatic Cholangiocarcinoma, stage IV- - s/p liver biopsy on 08/26/2022 showed metastatic moderately differentiated adenocarcinoma.  IHC stain positive for CK7 and CK20.  TTF-1, PAX8, GATA3 and CDX2 are negative.  Differentials include cholangiocarcinoma and pancreatic carcinoma. -PD-L1 0%.  Foundation 1 testing showed FGFR2 fusion gene  rearrangement [progressed on FGFR- inhibitor]. stage IV intrahepatic cholangiocarcinoma- # JULY 14th, 2025-  Innumerable metastatic lung nodules with overall increase in the size of the nodules; Ill-defined mass in the left lobe of the liver with increase in the calcifications-given the progressive disease recommend gemcitabine  Abraxane  chemotherapy [steroid sparing]. # JULY 25th, 2025- START GEM-ABRAXANE  q 2 W- with GCSF. Blood Tempus- OCT 2025-FGFR alteration noted; IHC HER2 negative otherwise no new mutations noted.  # CT- ordered for 10/21- Overall decrease in size of the numerous bilateral solid pulmonary nodules; Slight interval decrease in size of the ill-defined partially calcified mass in the left lobe of the liver with overlying capsular retraction;  New tiny sclerotic focus in the anterior aspect of the T4 vertebral body, nonspecific but possibly reflecting a new osseous metastasis;  Similar appearance of the sclerotic osseous metastasis in the T3 vertebral body and left T1 pedicle.  # Proceed with traetment  gemcitabine  Abraxane  every 2 weeks; Labs-CBC/chemistries were reviewed with the patient.   # weight loss/poor taste- prior  evaluation nutrition/Joli re: weight loss- currently on olanzapine -   #  PN- tingling/ or numbness-recommend Cold socks/ mittents- refer to acupuncture clinic.   # MAY 19th, 2025-  DVT of the left lower extremity-  Eliquis  5 mg twice daily for 6 months; and then recommend 2.5 mg twice daily indefinitely given her underlying cholangiocarcinoma. Stable.   # Diarrhea: G-1- -Chronic.  Imodium/Lomotil  as needed- Stable.   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 -Improving since off steroid.  Will monitor for now- Stable.  # Hypertension: -Continue with amlodipine  5 mg daily. Stable; refilled.Stable.   # Cough: -ongoing- ? Sec to mets- Continue with Tussionex as  needed.  Declines any refills at this time.  called in tessalon   pearls- Stable.   # History of sarcoidosis- -Management per pulmonary; -Never required treatment.  # IV access; port flush q 2 M  PS-  # DISPOSITION:  # chemo treatment today.  Follow up in 2 weeks lab Dr. B chemo     All questions were answered. The patient knows to call the clinic with any problems, questions or concerns.  Zelphia Cap, MD, PhD [facility] Hematology Oncology 01/14/2024

## 2024-01-14 NOTE — Assessment & Plan Note (Signed)
#   Intrahepatic Cholangiocarcinoma, stage IV- - s/p liver biopsy on 08/26/2022 showed metastatic moderately differentiated adenocarcinoma.  IHC stain positive for CK7 and CK20.  TTF-1, PAX8, GATA3 and CDX2 are negative.  Differentials include cholangiocarcinoma and pancreatic carcinoma. -PD-L1 0%.  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 stage IV intrahepatic cholangiocarcinoma- # JULY 14th, 2025-  Innumerable metastatic lung nodules with overall increase in the size of the nodules; Ill-defined mass in the left lobe of the liver with increase in the calcifications-given the progressive disease recommend gemcitabine  Abraxane  chemotherapy [steroid sparing]. # JULY 25th, 2025- START GEM-ABRAXANE  q 2 W- with GCSF. Blood Tempus- OCT 2025-FGFR alteration noted; IHC HER2 negative otherwise no new mutations noted.  # CT- ordered for 10/21- Overall decrease in size of the numerous bilateral solid pulmonary nodules; Slight interval decrease in size of the ill-defined partially calcified mass in the left lobe of the liver with overlying capsular retraction;  New tiny sclerotic focus in the anterior aspect of the T4 vertebral body, nonspecific but possibly reflecting a new osseous metastasis;  Similar appearance of the sclerotic osseous metastasis in the T3 vertebral body and left T1 pedicle.  # Proceed with traetment  gemcitabine  Abraxane  every 2 weeks; Labs-CBC/chemistries were reviewed with the patient.   # weight loss/poor taste- prior  evaluation nutrition/Joli re: weight loss- currently on olanzapine -   #  PN- tingling/ or numbness-recommend Cold socks/ mittents- refer to acupuncture clinic.   # MAY 19th, 2025-  DVT of the left lower extremity-  Eliquis  5 mg twice daily for 6 months; and then recommend 2.5 mg twice daily indefinitely given her underlying cholangiocarcinoma. Stable.   # Diarrhea: G-1- -Chronic.  Imodium/Lomotil  as needed- Stable.   #  Cushingoid features: -Likely iatrogenic from Decadron  use with chemotherapy. -Improving since off steroid.  Will monitor for now- Stable.  # Hypertension: -Continue with amlodipine  5 mg daily. Stable; refilled.Stable.   # Cough: -ongoing- ? Sec to mets- Continue with Tussionex as needed.  Declines any refills at this time.  called in tessalon  pearls- Stable.   # History of sarcoidosis- -Management per pulmonary; -Never required treatment.  # IV access; port flush q 2 M  PS-  # DISPOSITION:  # chemo treatment today.  Follow up in 2 weeks lab Dr. B chemo

## 2024-01-14 NOTE — Telephone Encounter (Signed)
 referral for acupuncture to [facility] on [address] sent patient informed

## 2024-01-17 ENCOUNTER — Telehealth: Payer: Self-pay | Admitting: Internal Medicine

## 2024-01-17 ENCOUNTER — Encounter: Payer: Self-pay | Admitting: Internal Medicine

## 2024-01-17 ENCOUNTER — Ambulatory Visit (INDEPENDENT_AMBULATORY_CARE_PROVIDER_SITE_OTHER): Admitting: Nurse Practitioner

## 2024-01-17 ENCOUNTER — Encounter (INDEPENDENT_AMBULATORY_CARE_PROVIDER_SITE_OTHER): Payer: Self-pay | Admitting: Nurse Practitioner

## 2024-01-17 VITALS — BP 132/85 | HR 109 | Resp 18 | Ht 69.0 in | Wt 223.2 lb

## 2024-01-17 DIAGNOSIS — I82532 Chronic embolism and thrombosis of left popliteal vein: Secondary | ICD-10-CM

## 2024-01-17 DIAGNOSIS — C221 Intrahepatic bile duct carcinoma: Secondary | ICD-10-CM | POA: Diagnosis not present

## 2024-01-17 LAB — CA 19-9 (SERIAL): CA 19-9: 91 U/mL — ABNORMAL HIGH (ref 0–35)

## 2024-01-17 NOTE — Telephone Encounter (Signed)
 Patient called to cancel injection appointment for tomorrow. She stated her insurance is not covering the medicine and she is not paying $6000.00 for it. I have cancelled the appointment but thought someone should know.

## 2024-01-18 ENCOUNTER — Inpatient Hospital Stay

## 2024-01-19 ENCOUNTER — Other Ambulatory Visit: Payer: Self-pay

## 2024-01-24 ENCOUNTER — Encounter (INDEPENDENT_AMBULATORY_CARE_PROVIDER_SITE_OTHER): Payer: Self-pay | Admitting: Nurse Practitioner

## 2024-01-24 NOTE — Progress Notes (Signed)
 Subjective:    Patient ID: Vicki Phillips, female    DOB: 1965/12/05, 57 y.o.   MRN: [id number] Chief Complaint  Patient presents with   Follow-up    5 month follow up no studies    HPI  Discussed the use of AI scribe software for clinical note transcription with the patient, who gave verbal consent to proceed.  History of Present Illness Vicki Phillips is a 58 year old female with deep vein thrombosis and active malignancy who presents for follow-up on anticoagulation therapy.  She has a history of deep vein thrombosis in her left leg, which developed after travel to [place]. She reports that a prior ultrasound showed the clot had resolved. Currently, there is no swelling or pain in the leg.  She is undergoing continuous chemotherapy for cholangiocarcinoma, which is challenging during the first week of each cycle but improves in the second week before symptoms worsen again. She is on palliative treatment and will continue chemotherapy indefinitely.  She is taking Eliquis  5 mg for anticoagulation without significant bleeding issues, although she notes minor bleeding when brushing her teeth, which is unpleasant but not excessive.  Her family history is significant for her mother dying from a blood clot following knee surgery. She is planning to travel to the [place].    Review of Systems  Cardiovascular:  Negative for leg swelling.  Hematological:  Bruises/bleeds easily.  All other systems reviewed and are negative.      Objective:   Physical Exam Vitals reviewed.  HENT:     Head: Normocephalic.  Cardiovascular:     Rate and Rhythm: Normal rate.     Pulses: Normal pulses.  Pulmonary:     Effort: Pulmonary effort is normal.  Musculoskeletal:     Right lower leg: No edema.     Left lower leg: No edema.  Skin:    General: Skin is warm and dry.  Neurological:     Mental Status: She is alert and oriented to person, place, and time.  Psychiatric:         Mood and Affect: Mood normal.        Behavior: Behavior normal.        Thought Content: Thought content normal.        Judgment: Judgment normal.    BP 132/85 (BP Location: Left Arm, Patient Position: Sitting, Cuff Size: Normal)   Pulse (!) 109   Resp 18   Ht 5' 9 (1.753 m)   Wt 223 lb 3.2 oz (101.2 kg)   LMP 08/21/2014 (Approximate)   BMI 32.96 kg/m   Past Medical History:  Diagnosis Date   Allergy    Bile duct cancer (HCC)    Sarcoidosis 2011    Social History   Socioeconomic History   Marital status: Single    Spouse name: Not on file   Number of children: 2   Years of education: Not on file   Highest education level: Not on file  Occupational History   Not on file  Tobacco Use   Smoking status: Never   Smokeless tobacco: Never  Vaping Use   Vaping status: Never Used  Substance and Sexual Activity   Alcohol use: Not Currently    Alcohol/week: 4.0 standard drinks of alcohol    Types: 4 Glasses of wine per week   Drug use: No   Sexual activity: Yes    Partners: Male    Birth control/protection: Surgical  Other Topics Concern   Not on  file  Social History Narrative   Not on file   Social Drivers of Health   Financial Resource Strain: Low Risk  (09/18/2022)   Overall Financial Resource Strain (CARDIA)    Difficulty of Paying Living Expenses: Not hard at all  Food Insecurity: No Food Insecurity (08/24/2022)   Hunger Vital Sign    Worried About Running Out of Food in the Last Year: Never true    Ran Out of Food in the Last Year: Never true  Transportation Needs: No Transportation Needs (08/24/2022)   PRAPARE - Administrator, Civil Service (Medical): No    Lack of Transportation (Non-Medical): No  Physical Activity: Not on file  Stress: Not on file  Social Connections: Not on file  Intimate Partner Violence: Not At Risk (08/24/2022)   Humiliation, Afraid, Rape, and Kick questionnaire    Fear of Current or Ex-Partner: No    Emotionally  Abused: No    Physically Abused: No    Sexually Abused: No    Past Surgical History:  Procedure Laterality Date   CESAREAN SECTION     x 2   COLONOSCOPY WITH PROPOFOL  N/A 08/10/2019   Procedure: COLONOSCOPY WITH PROPOFOL ;  Surgeon: Jinny Carmine, MD;  Location: [facility];  Service: Endoscopy;  Laterality: N/A;  priority 4   IR IMAGING GUIDED PORT INSERTION  09/11/2022   LUNG BIOPSY      Family History  Problem Relation Age of Onset   Stroke Mother    Heart disease Mother    Heart attack Father 27   Heart disease Father    Kidney disease Sister     Allergies  Allergen Reactions   Other Cough    Hay fever       Latest Ref Rng & Units 01/14/2024    8:44 AM 12/31/2023    8:33 AM 12/17/2023    8:38 AM  CBC  WBC 4.0 - 10.5 K/uL 5.7  5.7  3.2   Hemoglobin 12.0 - 15.0 g/dL 89.0  89.0  88.8   Hematocrit 36.0 - 46.0 % 32.3  32.3  33.0   Platelets 150 - 400 K/uL 153  156  251       CMP     Component Value Date/Time   NA 139 01/14/2024 0844   NA 136 07/31/2019 1021   NA 141 05/27/2013 0034   K 4.1 01/14/2024 0844   K 3.3 (L) 05/27/2013 0034   CL 106 01/14/2024 0844   CL 109 (H) 05/27/2013 0034   CO2 23 01/14/2024 0844   CO2 23 05/27/2013 0034   GLUCOSE 112 (H) 01/14/2024 0844   GLUCOSE 98 05/27/2013 0034   BUN 9 01/14/2024 0844   BUN 19 07/31/2019 1021   BUN 19 (H) 05/27/2013 0034   CREATININE 0.71 01/14/2024 0844   CREATININE 1.22 05/27/2013 0034   CALCIUM  8.6 (L) 01/14/2024 0844   CALCIUM  8.2 (L) 05/27/2013 0034   PROT 6.8 01/14/2024 0844   PROT 7.1 07/31/2019 1021   PROT 7.3 05/27/2013 0034   ALBUMIN 3.6 01/14/2024 0844   ALBUMIN 4.8 07/31/2019 1021   ALBUMIN 3.6 05/27/2013 0034   AST 43 (H) 01/14/2024 0844   ALT 35 01/14/2024 0844   ALT 21 05/27/2013 0034   ALKPHOS 118 01/14/2024 0844   ALKPHOS 53 05/27/2013 0034   BILITOT 0.5 01/14/2024 0844   GFRNONAA >60 01/14/2024 0844   GFRNONAA 52 (L) 05/27/2013 0034     No results found.  Assessment & Plan:   1. Chronic deep vein thrombosis (DVT) of popliteal vein of left lower extremity (HCC) (Primary) Chronic deep vein thrombosis of left popliteal vein, resolved, with ongoing anticoagulation management Chronic DVT of the left popliteal vein resolved. Continued anticoagulation with Eliquis  necessary due to active malignancy and thrombotic risk. Maintained Eliquis  at 5 mg for optimal thrombotic protection. Emphasized medication adherence during travel and preventive measures for flights. - Continue Eliquis  5 mg daily. - Wear compression socks during flights and prolonged immobility. - Perform calf exercises during flights to promote circulation. - Ensure adequate supply of Eliquis  during travel.  2. Intrahepatic cholangiocarcinoma (HCC) Active malignancy under palliative chemotherapy with increased thrombotic risk Active malignancy under palliative chemotherapy with increased thrombotic risk. Discussed potential Lovenox bridging and IVC filter for surgeries to mitigate perioperative thrombotic risk. - Continue palliative chemotherapy as prescribed. - Consider Lovenox bridging and IVC filter for future surgeries, particularly knee or hip surgeries.     Current Outpatient Medications on File Prior to Visit  Medication Sig Dispense Refill   amLODipine  (NORVASC ) 5 MG tablet Take 1 tablet (5 mg total) by mouth daily. 90 tablet 1   apixaban  (ELIQUIS ) 5 MG TABS tablet Take 1 tablet (5 mg total) by mouth 2 (two) times daily. 60 tablet 5   benzonatate  (TESSALON ) 100 MG capsule Take 1 capsule (100 mg total) by mouth 3 (three) times daily as needed for cough. 60 capsule 0   diphenoxylate -atropine  (LOMOTIL ) 2.5-0.025 MG tablet Take 1 tablet by mouth 4 (four) times daily as needed for diarrhea or loose stools. Take it along with immodium 60 tablet 0   loratadine (CLARITIN) 10 MG tablet Take 10 mg by mouth daily.     ondansetron  (ZOFRAN ) 8 MG tablet Take 1 tablet (8 mg total) by mouth  every 8 (eight) hours as needed for nausea or vomiting. 90 tablet 0   prochlorperazine  (COMPAZINE ) 10 MG tablet Take 1 tablet (10 mg total) by mouth every 6 (six) hours as needed for nausea or vomiting. 30 tablet 2   OLANZapine  (ZYPREXA ) 10 MG tablet TAKE 1 TABLET(10 MG) BY MOUTH AT BEDTIME 30 tablet 0   [DISCONTINUED] fluticasone  (FLONASE ) 50 MCG/ACT nasal spray Place 2 sprays into both nostrils daily. 16 g 0   No current facility-administered medications on file prior to visit.    There are no Patient Instructions on file for this visit. No follow-ups on file.   Almer Bushey E Devaughn Savant, NP

## 2024-01-28 ENCOUNTER — Inpatient Hospital Stay: Attending: Internal Medicine

## 2024-01-28 ENCOUNTER — Ambulatory Visit: Payer: Self-pay | Admitting: Oncology

## 2024-01-28 ENCOUNTER — Inpatient Hospital Stay: Admitting: Nurse Practitioner

## 2024-01-28 ENCOUNTER — Inpatient Hospital Stay

## 2024-01-28 ENCOUNTER — Encounter: Payer: Self-pay | Admitting: Nurse Practitioner

## 2024-01-28 VITALS — BP 123/92 | HR 85 | Temp 99.5°F | Ht 69.0 in | Wt 226.0 lb

## 2024-01-28 DIAGNOSIS — C7800 Secondary malignant neoplasm of unspecified lung: Secondary | ICD-10-CM | POA: Diagnosis not present

## 2024-01-28 DIAGNOSIS — C221 Intrahepatic bile duct carcinoma: Secondary | ICD-10-CM

## 2024-01-28 DIAGNOSIS — D701 Agranulocytosis secondary to cancer chemotherapy: Secondary | ICD-10-CM | POA: Diagnosis not present

## 2024-01-28 DIAGNOSIS — Z5111 Encounter for antineoplastic chemotherapy: Secondary | ICD-10-CM | POA: Diagnosis present

## 2024-01-28 DIAGNOSIS — C7802 Secondary malignant neoplasm of left lung: Secondary | ICD-10-CM | POA: Diagnosis not present

## 2024-01-28 DIAGNOSIS — C7801 Secondary malignant neoplasm of right lung: Secondary | ICD-10-CM

## 2024-01-28 DIAGNOSIS — C7951 Secondary malignant neoplasm of bone: Secondary | ICD-10-CM | POA: Insufficient documentation

## 2024-01-28 DIAGNOSIS — Z79899 Other long term (current) drug therapy: Secondary | ICD-10-CM | POA: Insufficient documentation

## 2024-01-28 LAB — CMP (CANCER CENTER ONLY)
ALT: 64 U/L — ABNORMAL HIGH (ref 0–44)
AST: 64 U/L — ABNORMAL HIGH (ref 15–41)
Albumin: 3.9 g/dL (ref 3.5–5.0)
Alkaline Phosphatase: 112 U/L (ref 38–126)
Anion gap: 14 (ref 5–15)
BUN: 7 mg/dL (ref 6–20)
CO2: 22 mmol/L (ref 22–32)
Calcium: 8.7 mg/dL — ABNORMAL LOW (ref 8.9–10.3)
Chloride: 107 mmol/L (ref 98–111)
Creatinine: 0.74 mg/dL (ref 0.44–1.00)
GFR, Estimated: >60 mL/min (ref >60)
Glucose, Bld: 91 mg/dL (ref 70–99)
Potassium: 3.6 mmol/L (ref 3.5–5.1)
Sodium: 143 mmol/L (ref 135–145)
Total Bilirubin: 0.4 mg/dL (ref 0.0–1.2)
Total Protein: 6.5 g/dL (ref 6.5–8.1)

## 2024-01-28 LAB — CBC WITH DIFFERENTIAL (CANCER CENTER ONLY)
Abs Immature Granulocytes: 0.01 K/uL (ref 0.00–0.07)
Basophils Absolute: 0 K/uL (ref 0.0–0.1)
Basophils Relative: 1 %
Eosinophils Absolute: 0.1 K/uL (ref 0.0–0.5)
Eosinophils Relative: 5 %
HCT: 32 % — ABNORMAL LOW (ref 36.0–46.0)
Hemoglobin: 10.8 g/dL — ABNORMAL LOW (ref 12.0–15.0)
Immature Granulocytes: 1 %
Lymphocytes Relative: 30 %
Lymphs Abs: 0.4 K/uL — ABNORMAL LOW (ref 0.7–4.0)
MCH: 42.2 pg — ABNORMAL HIGH (ref 26.0–34.0)
MCHC: 33.8 g/dL (ref 30.0–36.0)
MCV: 125 fL — ABNORMAL HIGH (ref 80.0–100.0)
Monocytes Absolute: 0.2 K/uL (ref 0.1–1.0)
Monocytes Relative: 15 %
Neutro Abs: 0.7 K/uL — ABNORMAL LOW (ref 1.7–7.7)
Neutrophils Relative %: 48 %
Platelet Count: 142 K/uL — ABNORMAL LOW (ref 150–400)
RBC: 2.56 MIL/uL — ABNORMAL LOW (ref 3.87–5.11)
RDW: 14.6 % (ref 11.5–15.5)
WBC Count: 1.4 K/uL — ABNORMAL LOW (ref 4.0–10.5)
nRBC: 0 % (ref 0.0–0.2)

## 2024-01-28 NOTE — Progress Notes (Signed)
 Hematology/Oncology Progress note [phone] [phone]     Patient Care Team: Justus Leita DEL, MD as PCP - General (Internal Medicine) Verdene Gills, RN as Oncology Nurse Navigator Rennie Cindy SAUNDERS, MD as Consulting Physician (Oncology) Melanee Annah BROCKS, MD as Consulting Physician (Oncology)  CHIEF COMPLAINTS/PURPOSE OF CONSULTATION: cholangiocarcinoma  Oncology History Overview Note   Cisplatin  25 mg/m/gemcitabine  1000 mg/m/Durvalumab  -completed 8 cycles on 03/04/2023 Maintenance Durvalumab /gemcitabine -started 03/26/2023   ASSESSMENT & PLAN:  Vicki Phillips 58 y.o. 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   # Intrahepatic Cholangiocarcinoma, stage IV - s/p liver biopsy on 08/26/2022 showed metastatic moderately differentiated adenocarcinoma.  IHC stain positive for CK7 and CK20.  TTF-1, PAX8, GATA3 and CDX2 are negative.  Differentials include cholangiocarcinoma and pancreatic carcinoma.    -PD-L1 0%.  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   -s/p cisplatin , gemcitabine  and Durvalumab  every 3 weeks for 8 cycles.  Then on maintenance Durvalumab  and gemcitabine  until March 2025. -With rising CA 19-9, CT chest abdomen pelvis was done which unfortunately showed progression of the lung nodules.   - Based on FGFR 2 gene rearrangement, plan was to start futibatinib  16 mg.  Patient decided to start at full dose 20 mg on 06/04/2023.  Phosphorus is elevated to 7.5.  Dosing guidelines reviewed.  Will decrease dose to 16 mg once daily.  Weekly phosphorus level check.  Started on PhosLo  2 tabs with every meals.  If phosphorus level goes below 7 in the next 2 weeks, we will continue with futibatinib  16 mg once daily.   # JUNE 2025- HOLD futibatinib  sec to myalgias/arthralgias/ocular symptoms  # # JULY 14th, 2025-  Innumerable metastatic lung nodules with overall  increase in the size of the nodules; Ill-defined mass in the left lobe of the liver with increase in the calcifications-given the progressive disease recommend gemcitabine  Abraxane  chemotherapy [steroid sparing].   # JULY 25th, 2025- Proceed with cycle #1-gemcitabine  Abraxane  every 2 weeks;  # MAY 19th, 2025- LEFT DVT- on eliquis  [email or web address] ]     Intrahepatic cholangiocarcinoma (HCC)  08/26/2022 Cancer Staging   Staging form: Intrahepatic Bile Duct, AJCC 8th Edition - Clinical stage from 08/26/2022: Stage IV (cM1) - Signed by Clista Bimler, MD on 09/03/2022 Stage prefix: Initial diagnosis   09/03/2022 Initial Diagnosis   Cholangiocarcinoma (HCC)   09/18/2022 - 04/30/2023 Chemotherapy   Patient is on Treatment Plan : BILIARY TRACT Cisplatin  + Gemcitabine  D1,8 + Durvalumab  (1500) D1 q21d / Durvalumab  (1500) q28d     09/17/2023 -  Chemotherapy   Patient is on Treatment Plan : PANCREATIC Abraxane  D1,8,15 + Gemcitabine  D1,8,15 q28d     Metastasis to lung (HCC)  09/03/2022 Initial Diagnosis   Metastasis to lung (HCC)   09/18/2022 - 04/30/2023 Chemotherapy   Patient is on Treatment Plan : BILIARY TRACT Cisplatin  + Gemcitabine  D1,8 + Durvalumab  (1500) D1 q21d / Durvalumab  (1500) q28d     09/17/2023 -  Chemotherapy   Patient is on Treatment Plan : PANCREATIC Abraxane  D1,8,15 + Gemcitabine  D1,8,15 q28d      HISTORY OF PRESENTING ILLNESS: Patient ambulating-independently.  Vicki Phillips 58 y.o.  female pleasant patient with a history of intrahepatic stage IV cholangiocarcinoma with metastasis to lungs- MAY 2025-  DVT of the left lower extremity-  Eliquis  on Gem-abraxane  q 2 weeks   She experiences neuropathy in her feet, which worsens with each chemotherapy session. The neuropathy is described as numbing rather  than tingling. She is not currently taking any medication for this symptom.  She reports that symptoms are stable today.  Her appetite is reduced, but her weight has remained stable. She  is currently taking olanzapine . She experiences diarrhea and takes Imodium or Lomotil  as needed, which she finds helpful.  Her cough is stable and unchanged. She has a history of low blood counts following previous treatments  She reports that GCSF injection was denied by insurance and she has a large bill that our billing department is working on right now.  She did not come in for her GCSF injection on 11/24.  Today her ANC is .7.  She denies any fever/chills, no body aches no s/sx of infection.  However due to low ANC today I discussed treatment plan with Dr. KATHEE.  He ordered to hold treatment today.  I did check with prior auth team that confirmed that patient was approved for GCSF through march of 2026.  So we will hold treatment today.  She will come back in 2 weeks for labs and possible treatment with GCSF that following Monday. Due to her hx of neutropenia with treatment it is necessary that she receive GCSF injection to prevent delay in treatment due to neutropenia.  Review of Systems  Constitutional:  Positive for malaise/fatigue. Negative for chills, diaphoresis, fever and weight loss.  HENT:  Negative for nosebleeds and sore throat.   Eyes:  Negative for double vision.  Respiratory:  Negative for cough, hemoptysis, sputum production, shortness of breath and wheezing.   Cardiovascular:  Positive for leg swelling. Negative for chest pain, palpitations and orthopnea.  Gastrointestinal:  Negative for abdominal pain, blood in stool, constipation, diarrhea, heartburn, melena, nausea and vomiting.  Genitourinary:  Negative for dysuria, frequency and urgency.  Musculoskeletal:  Positive for back pain and joint pain.  Skin: Negative.  Negative for itching and rash.  Neurological:  Positive for sensory change. Negative for dizziness, tingling, focal weakness, weakness and headaches.  Endo/Heme/Allergies:  Does not bruise/bleed easily.  Psychiatric/Behavioral:  Negative for depression. The patient  is not nervous/anxious and does not have insomnia.     MEDICAL HISTORY:  Past Medical History:  Diagnosis Date   Allergy    Bile duct cancer (HCC)    Sarcoidosis 2011    SURGICAL HISTORY: Past Surgical History:  Procedure Laterality Date   CESAREAN SECTION     x 2   COLONOSCOPY WITH PROPOFOL  N/A 08/10/2019   Procedure: COLONOSCOPY WITH PROPOFOL ;  Surgeon: Jinny Carmine, MD;  Location: [facility];  Service: Endoscopy;  Laterality: N/A;  priority 4   IR IMAGING GUIDED PORT INSERTION  09/11/2022   LUNG BIOPSY      SOCIAL HISTORY: Social History   Socioeconomic History   Marital status: Single    Spouse name: Not on file   Number of children: 2   Years of education: Not on file   Highest education level: Not on file  Occupational History   Not on file  Tobacco Use   Smoking status: Never   Smokeless tobacco: Never  Vaping Use   Vaping status: Never Used  Substance and Sexual Activity   Alcohol use: Not Currently    Alcohol/week: 4.0 standard drinks of alcohol    Types: 4 Glasses of wine per week   Drug use: No   Sexual activity: Yes    Partners: Male    Birth control/protection: Surgical  Other Topics Concern   Not on file  Social History Narrative  Not on file   Social Drivers of Health   Financial Resource Strain: Low Risk  (09/18/2022)   Overall Financial Resource Strain (CARDIA)    Difficulty of Paying Living Expenses: Not hard at all  Food Insecurity: No Food Insecurity (08/24/2022)   Hunger Vital Sign    Worried About Running Out of Food in the Last Year: Never true    Ran Out of Food in the Last Year: Never true  Transportation Needs: No Transportation Needs (08/24/2022)   PRAPARE - Administrator, Civil Service (Medical): No    Lack of Transportation (Non-Medical): No  Physical Activity: Not on file  Stress: Not on file  Social Connections: Not on file  Intimate Partner Violence: Not At Risk (08/24/2022)   Humiliation, Afraid, Rape,  and Kick questionnaire    Fear of Current or Ex-Partner: No    Emotionally Abused: No    Physically Abused: No    Sexually Abused: No    FAMILY HISTORY: Family History  Problem Relation Age of Onset   Stroke Mother    Heart disease Mother    Heart attack Father 34   Heart disease Father    Kidney disease Sister     ALLERGIES:  is allergic to other.  MEDICATIONS:  Current Outpatient Medications  Medication Sig Dispense Refill   amLODipine  (NORVASC ) 5 MG tablet Take 1 tablet (5 mg total) by mouth daily. 90 tablet 1   apixaban  (ELIQUIS ) 5 MG TABS tablet Take 1 tablet (5 mg total) by mouth 2 (two) times daily. 60 tablet 5   benzonatate  (TESSALON ) 100 MG capsule Take 1 capsule (100 mg total) by mouth 3 (three) times daily as needed for cough. 60 capsule 0   diphenoxylate -atropine  (LOMOTIL ) 2.5-0.025 MG tablet Take 1 tablet by mouth 4 (four) times daily as needed for diarrhea or loose stools. Take it along with immodium 60 tablet 0   loratadine (CLARITIN) 10 MG tablet Take 10 mg by mouth daily.     OLANZapine  (ZYPREXA ) 10 MG tablet TAKE 1 TABLET(10 MG) BY MOUTH AT BEDTIME 30 tablet 0   ondansetron  (ZOFRAN ) 8 MG tablet Take 1 tablet (8 mg total) by mouth every 8 (eight) hours as needed for nausea or vomiting. 90 tablet 0   prochlorperazine  (COMPAZINE ) 10 MG tablet Take 1 tablet (10 mg total) by mouth every 6 (six) hours as needed for nausea or vomiting. 30 tablet 2   No current facility-administered medications for this visit.    PHYSICAL EXAMINATION:   Vitals:   01/28/24 0838 01/28/24 0900  BP: (!) 125/94 (!) 123/92  Pulse: 85   Temp: 99.5 F (37.5 C)   SpO2: 96%     Filed Weights   01/28/24 0838  Weight: 226 lb (102.5 kg)    LEFT LOWER extremity- Swelling and tenderness.   Physical Exam Vitals and nursing note reviewed.  HENT:     Head: Normocephalic and atraumatic.  Eyes:     Extraocular Movements: Extraocular movements intact.     Pupils: Pupils are equal,  round, and reactive to light.  Cardiovascular:     Rate and Rhythm: Normal rate and regular rhythm.  Pulmonary:     Comments: Decreased breath sounds bilaterally.  Abdominal:     General: There is no distension.     Palpations: Abdomen is soft.  Musculoskeletal:        General: Normal range of motion.     Cervical back: Normal range of motion.  Right lower leg: No edema.     Left lower leg: No edema.  Skin:    General: Skin is warm.     Findings: No rash.  Neurological:     General: No focal deficit present.     Mental Status: She is alert and oriented to person, place, and time.  Psychiatric:        Mood and Affect: Mood normal.     LABORATORY DATA:  I have reviewed the data as listed Lab Results  Component Value Date   WBC 1.4 (L) 01/28/2024   HGB 10.8 (L) 01/28/2024   HCT 32.0 (L) 01/28/2024   MCV 125.0 (H) 01/28/2024   PLT 142 (L) 01/28/2024   Lab Results  Component Value Date   NEUTROABS 0.7 (L) 01/28/2024    Recent Labs    12/31/23 0833 01/14/24 0844 01/28/24 0844  NA 139 139 143  K 3.7 4.1 3.6  CL 108 106 107  CO2 23 23 22   GLUCOSE 99 112* 91  BUN 11 9 7   CREATININE 0.74 0.71 0.74  CALCIUM  8.8* 8.6* 8.7*  GFRNONAA >60 >60 >60  PROT 7.1 6.8 6.5  ALBUMIN 3.8 3.6 3.9  AST 51* 43* 64*  ALT 41 35 64*  ALKPHOS 119 118 112  BILITOT 0.7 0.5 0.4    RADIOGRAPHIC STUDIES: I have personally reviewed the radiological images as listed and agreed with the findings in the report. No results found.   ASSESSMENT & PLAN:   Intrahepatic cholangiocarcinoma (HCC) # Intrahepatic Cholangiocarcinoma, stage IV- - s/p liver biopsy on 08/26/2022 showed metastatic moderately differentiated adenocarcinoma.  IHC stain positive for CK7 and CK20.  TTF-1, PAX8, GATA3 and CDX2 are negative.  Differentials include cholangiocarcinoma and pancreatic carcinoma. -PD-L1 0%.  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 stage IV  intrahepatic cholangiocarcinoma- # JULY 14th, 2025-  Innumerable metastatic lung nodules with overall increase in the size of the nodules; Ill-defined mass in the left lobe of the liver with increase in the calcifications-given the progressive disease recommend gemcitabine  Abraxane  chemotherapy [steroid sparing]. # JULY 25th, 2025- START GEM-ABRAXANE  q 2 W- with GCSF. Blood Tempus- OCT 2025-FGFR alteration noted; IHC HER2 negative otherwise no new mutations noted.   # CT- ordered for 10/21- Overall decrease in size of the numerous bilateral solid pulmonary nodules; Slight interval decrease in size of the ill-defined partially calcified mass in the left lobe of the liver with overlying capsular retraction;  New tiny sclerotic focus in the anterior aspect of the T4 vertebral body, nonspecific but possibly reflecting a new osseous metastasis;  Similar appearance of the sclerotic osseous metastasis in the T3 vertebral body and left T1 pedicle.   # Hold Gem-abraxane  treatment today due to ANC .7 and history of neutropenia.  Did not take GCSF injection post last treatment due to insurance concerns.  Prior auth team confirms approval through march 2026.  Hold treatment today she is to return to clinic in 2 weeks for labs and treatment then proceed with GCSF injection the following Monday post treatment on friday   # weight loss/poor taste- prior  evaluation nutrition/Joli re: weight loss- currently on olanzapine - stable   #  PN- tingling/ or numbness-recommend Cold socks/ mittents-  stable   # MAY 19th, 2025-  DVT of the left lower extremity-  Eliquis  5 mg twice daily for 6 months; and then recommend 2.5 mg twice daily indefinitely given her underlying cholangiocarcinoma. Stable.   # Diarrhea: G-1- -Chronic.  Imodium/Lomotil  as needed- Stable.   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 -Improving since off steroid.  Will monitor for now- Stable.   # Hypertension:  -Continue with amlodipine  5 mg daily. Stable; refilled.Stable.   # Cough: -ongoing- ? Sec to mets- Continue with Tussionex as needed.  Declines any refills at this time.  called in tessalon  pearls- Stable.   # History of sarcoidosis- -Management per pulmonary; -Never required treatment.   # IV access; port   # JRE:inwz-   # Disability: wants to go back to work- ok to check with employer   Follow up plan: No treatment today No injection 12/8 Follow up in 2 weeks cbc with diff, cmp, Dr. B chemo (12/19) GCSF injection Monday (12/22) LP        Above plan of care was discussed with patient/family in detail.  My contact information was given to the patient/family.      All questions were answered. The patient knows to call the clinic with any problems, questions or concerns.  Morna Husband AGNP-C [facility] Oncology 01/28/2024

## 2024-01-31 ENCOUNTER — Inpatient Hospital Stay

## 2024-01-31 ENCOUNTER — Inpatient Hospital Stay: Admitting: Internal Medicine

## 2024-01-31 ENCOUNTER — Encounter: Payer: Self-pay | Admitting: Internal Medicine

## 2024-02-01 ENCOUNTER — Inpatient Hospital Stay

## 2024-02-10 ENCOUNTER — Encounter: Payer: Self-pay | Admitting: *Deleted

## 2024-02-11 ENCOUNTER — Inpatient Hospital Stay

## 2024-02-11 ENCOUNTER — Inpatient Hospital Stay: Admitting: Nurse Practitioner

## 2024-02-11 VITALS — BP 141/98 | HR 88 | Temp 98.5°F | Resp 17 | Wt 223.0 lb

## 2024-02-11 DIAGNOSIS — C221 Intrahepatic bile duct carcinoma: Secondary | ICD-10-CM

## 2024-02-11 DIAGNOSIS — C7951 Secondary malignant neoplasm of bone: Secondary | ICD-10-CM | POA: Diagnosis not present

## 2024-02-11 DIAGNOSIS — Z79899 Other long term (current) drug therapy: Secondary | ICD-10-CM | POA: Diagnosis not present

## 2024-02-11 DIAGNOSIS — C7800 Secondary malignant neoplasm of unspecified lung: Secondary | ICD-10-CM | POA: Diagnosis not present

## 2024-02-11 DIAGNOSIS — Z5111 Encounter for antineoplastic chemotherapy: Secondary | ICD-10-CM | POA: Diagnosis not present

## 2024-02-11 DIAGNOSIS — C7801 Secondary malignant neoplasm of right lung: Secondary | ICD-10-CM

## 2024-02-11 LAB — CMP (CANCER CENTER ONLY)
ALT: 34 U/L (ref 0–44)
AST: 59 U/L — ABNORMAL HIGH (ref 15–41)
Albumin: 4.1 g/dL (ref 3.5–5.0)
Alkaline Phosphatase: 107 U/L (ref 38–126)
Anion gap: 14 (ref 5–15)
BUN: 7 mg/dL (ref 6–20)
CO2: 22 mmol/L (ref 22–32)
Calcium: 8.8 mg/dL — ABNORMAL LOW (ref 8.9–10.3)
Chloride: 107 mmol/L (ref 98–111)
Creatinine: 0.78 mg/dL (ref 0.44–1.00)
GFR, Estimated: >60 mL/min
Glucose, Bld: 90 mg/dL (ref 70–99)
Potassium: 3.7 mmol/L (ref 3.5–5.1)
Sodium: 144 mmol/L (ref 135–145)
Total Bilirubin: 0.5 mg/dL (ref 0.0–1.2)
Total Protein: 6.9 g/dL (ref 6.5–8.1)

## 2024-02-11 LAB — CBC WITH DIFFERENTIAL (CANCER CENTER ONLY)
Abs Immature Granulocytes: 0 K/uL (ref 0.00–0.07)
Basophils Absolute: 0 K/uL (ref 0.0–0.1)
Basophils Relative: 1 %
Eosinophils Absolute: 0.1 K/uL (ref 0.0–0.5)
Eosinophils Relative: 6 %
HCT: 39.5 % (ref 36.0–46.0)
Hemoglobin: 13.2 g/dL (ref 12.0–15.0)
Immature Granulocytes: 0 %
Lymphocytes Relative: 25 %
Lymphs Abs: 0.5 K/uL — ABNORMAL LOW (ref 0.7–4.0)
MCH: 40.5 pg — ABNORMAL HIGH (ref 26.0–34.0)
MCHC: 33.4 g/dL (ref 30.0–36.0)
MCV: 121.2 fL — ABNORMAL HIGH (ref 80.0–100.0)
Monocytes Absolute: 0.3 K/uL (ref 0.1–1.0)
Monocytes Relative: 12 %
Neutro Abs: 1.2 K/uL — ABNORMAL LOW (ref 1.7–7.7)
Neutrophils Relative %: 56 %
Platelet Count: 138 K/uL — ABNORMAL LOW (ref 150–400)
RBC: 3.26 MIL/uL — ABNORMAL LOW (ref 3.87–5.11)
RDW: 12.9 % (ref 11.5–15.5)
WBC Count: 2.2 K/uL — ABNORMAL LOW (ref 4.0–10.5)
nRBC: 0 % (ref 0.0–0.2)

## 2024-02-11 MED ORDER — PROCHLORPERAZINE MALEATE 10 MG PO TABS
10.0000 mg | ORAL_TABLET | Freq: Once | ORAL | Status: AC
  Administered 2024-02-11: 10 mg via ORAL
  Filled 2024-02-11: qty 1

## 2024-02-11 MED ORDER — SODIUM CHLORIDE 0.9 % IV SOLN
1000.0000 mg/m2 | Freq: Once | INTRAVENOUS | Status: AC
  Administered 2024-02-11: 2242 mg via INTRAVENOUS
  Filled 2024-02-11: qty 58.97

## 2024-02-11 MED ORDER — SODIUM CHLORIDE 0.9 % IV SOLN
INTRAVENOUS | Status: DC
  Filled 2024-02-11: qty 250

## 2024-02-11 MED ORDER — PACLITAXEL PROTEIN-BOUND CHEMO INJECTION 100 MG
125.0000 mg/m2 | Freq: Once | INTRAVENOUS | Status: AC
  Administered 2024-02-11: 300 mg via INTRAVENOUS
  Filled 2024-02-11: qty 60

## 2024-02-11 NOTE — Progress Notes (Signed)
 Hematology/Oncology Progress note [phone] [phone]     Patient Care Team: Vicki Leita DEL, MD as PCP - General (Internal Medicine) Vicki Gills, RN as Oncology Nurse Navigator Vicki Cindy SAUNDERS, MD as Consulting Physician (Oncology) Vicki Annah BROCKS, MD as Consulting Physician (Oncology)  CHIEF COMPLAINTS/PURPOSE OF CONSULTATION: cholangiocarcinoma  Oncology History Overview Note   Cisplatin  25 mg/m/gemcitabine  1000 mg/m/Durvalumab  -completed 8 cycles on 03/04/2023 Maintenance Durvalumab /gemcitabine -started 03/26/2023   ASSESSMENT & PLAN:  Vicki Phillips 58 y.o. 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   # Intrahepatic Cholangiocarcinoma, stage IV - s/p liver biopsy on 08/26/2022 showed metastatic moderately differentiated adenocarcinoma.  IHC stain positive for CK7 and CK20.  TTF-1, PAX8, GATA3 and CDX2 are negative.  Differentials include cholangiocarcinoma and pancreatic carcinoma.    -PD-L1 0%.  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   -s/p cisplatin , gemcitabine  and Durvalumab  every 3 weeks for 8 cycles.  Then on maintenance Durvalumab  and gemcitabine  until March 2025. -With rising CA 19-9, CT chest abdomen pelvis was done which unfortunately showed progression of the lung nodules.   - Based on FGFR 2 gene rearrangement, plan was to start futibatinib  16 mg.  Patient decided to start at full dose 20 mg on 06/04/2023.  Phosphorus is elevated to 7.5.  Dosing guidelines reviewed.  Will decrease dose to 16 mg once daily.  Weekly phosphorus level check.  Started on PhosLo  2 tabs with every meals.  If phosphorus level goes below 7 in the next 2 weeks, we will continue with futibatinib  16 mg once daily.   # JUNE 2025- HOLD futibatinib  sec to myalgias/arthralgias/ocular symptoms  # # JULY 14th, 2025-  Innumerable metastatic lung nodules with overall  increase in the size of the nodules; Ill-defined mass in the left lobe of the liver with increase in the calcifications-given the progressive disease recommend gemcitabine  Abraxane  chemotherapy [steroid sparing].   # JULY 25th, 2025- Proceed with cycle #1-gemcitabine  Abraxane  every 2 weeks;  # MAY 19th, 2025- LEFT DVT- on eliquis  [email or web address] ]     Intrahepatic cholangiocarcinoma (HCC)  08/26/2022 Cancer Staging   Staging form: Intrahepatic Bile Duct, AJCC 8th Edition - Clinical stage from 08/26/2022: Stage IV (cM1) - Signed by Clista Bimler, MD on 09/03/2022 Stage prefix: Initial diagnosis   09/03/2022 Initial Diagnosis   Cholangiocarcinoma (HCC)   09/18/2022 - 04/30/2023 Chemotherapy   Patient is on Treatment Plan : BILIARY TRACT Cisplatin  + Gemcitabine  D1,8 + Durvalumab  (1500) D1 q21d / Durvalumab  (1500) q28d     09/17/2023 -  Chemotherapy   Patient is on Treatment Plan : PANCREATIC Abraxane  D1,8,15 + Gemcitabine  D1,8,15 q28d     Metastasis to lung (HCC)  09/03/2022 Initial Diagnosis   Metastasis to lung (HCC)   09/18/2022 - 04/30/2023 Chemotherapy   Patient is on Treatment Plan : BILIARY TRACT Cisplatin  + Gemcitabine  D1,8 + Durvalumab  (1500) D1 q21d / Durvalumab  (1500) q28d     09/17/2023 -  Chemotherapy   Patient is on Treatment Plan : PANCREATIC Abraxane  D1,8,15 + Gemcitabine  D1,8,15 q28d      HISTORY OF PRESENTING ILLNESS: Patient ambulating-independently.  Vicki Phillips 58 y.o.  female pleasant patient with a history of intrahepatic stage IV cholangiocarcinoma with metastasis to lungs- MAY 2025-  DVT of the left lower extremity-  Eliquis  on Gem-abraxane  q 2 weeks   She experiences neuropathy in her feet, which worsens with each chemotherapy session. The neuropathy is described as numbing rather  than tingling. She is not currently taking any medication for this symptom.  She reports that symptoms are stable today.  Her appetite is reduced, but her weight has remained stable. She  is currently taking olanzapine . She experiences diarrhea and takes Imodium or Lomotil  as needed, which she finds helpful.  Her cough is stable and unchanged. She has a history of low blood counts following previous treatments  Previous treatment held due to low ANC.  She had not gotten her G-CSF injection because insurance was telling her that it was denied.  I reached out to billing today they are working on getting this fixed and her insurance will cover her G-CSF injection.  We will proceed with treatment today as her ANC is 1.2.  She will need to come back on Monday for her G-CSF injection.  She denies any fever or chills.  No signs or symptoms of infection.  Overall feeling well.  Proceed with treatment.  Review of Systems  Constitutional:  Positive for malaise/fatigue. Negative for chills, diaphoresis, fever and weight loss.  HENT:  Negative for nosebleeds and sore throat.   Eyes:  Negative for double vision.  Respiratory:  Negative for cough, hemoptysis, sputum production, shortness of breath and wheezing.   Cardiovascular:  Negative for chest pain, palpitations, orthopnea and leg swelling.  Gastrointestinal:  Negative for abdominal pain, blood in stool, constipation, diarrhea, heartburn, melena, nausea and vomiting.  Genitourinary:  Negative for dysuria, frequency and urgency.  Musculoskeletal:  Positive for back pain and joint pain.  Skin: Negative.  Negative for itching and rash.  Neurological:  Positive for sensory change. Negative for dizziness, tingling, focal weakness, weakness and headaches.  Endo/Heme/Allergies:  Does not bruise/bleed easily.  Psychiatric/Behavioral:  Negative for depression. The patient is not nervous/anxious and does not have insomnia.     MEDICAL HISTORY:  Past Medical History:  Diagnosis Date   Allergy    Bile duct cancer (HCC)    Sarcoidosis 2011    SURGICAL HISTORY: Past Surgical History:  Procedure Laterality Date   CESAREAN SECTION     x 2    COLONOSCOPY WITH PROPOFOL  N/A 08/10/2019   Procedure: COLONOSCOPY WITH PROPOFOL ;  Surgeon: Jinny Carmine, MD;  Location: [facility];  Service: Endoscopy;  Laterality: N/A;  priority 4   IR IMAGING GUIDED PORT INSERTION  09/11/2022   LUNG BIOPSY      SOCIAL HISTORY: Social History   Socioeconomic History   Marital status: Single    Spouse name: Not on file   Number of children: 2   Years of education: Not on file   Highest education level: Not on file  Occupational History   Not on file  Tobacco Use   Smoking status: Never   Smokeless tobacco: Never  Vaping Use   Vaping status: Never Used  Substance and Sexual Activity   Alcohol use: Not Currently    Alcohol/week: 4.0 standard drinks of alcohol    Types: 4 Glasses of wine per week   Drug use: No   Sexual activity: Yes    Partners: Male    Birth control/protection: Surgical  Other Topics Concern   Not on file  Social History Narrative   Not on file   Social Drivers of Health   Tobacco Use: Low Risk (02/11/2024)   Patient History    Smoking Tobacco Use: Never    Smokeless Tobacco Use: Never    Passive Exposure: Not on file  Financial Resource Strain: Low Risk (09/18/2022)  Overall Financial Resource Strain (CARDIA)    Difficulty of Paying Living Expenses: Not hard at all  Food Insecurity: No Food Insecurity (08/24/2022)   Hunger Vital Sign    Worried About Running Out of Food in the Last Year: Never true    Ran Out of Food in the Last Year: Never true  Transportation Needs: No Transportation Needs (08/24/2022)   PRAPARE - Administrator, Civil Service (Medical): No    Lack of Transportation (Non-Medical): No  Physical Activity: Not on file  Stress: Not on file  Social Connections: Not on file  Intimate Partner Violence: Not At Risk (08/24/2022)   Humiliation, Afraid, Rape, and Kick questionnaire    Fear of Current or Ex-Partner: No    Emotionally Abused: No    Physically Abused: No    Sexually  Abused: No  Depression (PHQ2-9): Low Risk (02/11/2024)   Depression (PHQ2-9)    PHQ-2 Score: 1  Recent Concern: Depression (PHQ2-9) - Medium Risk (01/28/2024)   Depression (PHQ2-9)    PHQ-2 Score: 8  Alcohol Screen: Not on file  Housing: Unknown (03/16/2023)   Received from [facility]   Epic    Unable to Pay for Housing in the Last Year: Not on file    Number of Times Moved in the Last Year: Not on file    At any time in the past 12 months, were you homeless or living in a shelter (including now)?: No  Utilities: Not At Risk (08/24/2022)   [facility] Utilities    Threatened with loss of utilities: No  Health Literacy: Adequate Health Literacy (09/18/2022)   B1300 Health Literacy    Frequency of need for help with medical instructions: Never    FAMILY HISTORY: Family History  Problem Relation Age of Onset   Stroke Mother    Heart disease Mother    Heart attack Father 46   Heart disease Father    Kidney disease Sister     ALLERGIES:  is allergic to other.  MEDICATIONS:  Current Outpatient Medications  Medication Sig Dispense Refill   amLODipine  (NORVASC ) 5 MG tablet Take 1 tablet (5 mg total) by mouth daily. 90 tablet 1   apixaban  (ELIQUIS ) 5 MG TABS tablet Take 1 tablet (5 mg total) by mouth 2 (two) times daily. 60 tablet 5   benzonatate  (TESSALON ) 100 MG capsule Take 1 capsule (100 mg total) by mouth 3 (three) times daily as needed for cough. 60 capsule 0   diphenoxylate -atropine  (LOMOTIL ) 2.5-0.025 MG tablet Take 1 tablet by mouth 4 (four) times daily as needed for diarrhea or loose stools. Take it along with immodium 60 tablet 0   loratadine (CLARITIN) 10 MG tablet Take 10 mg by mouth daily.     OLANZapine  (ZYPREXA ) 10 MG tablet TAKE 1 TABLET(10 MG) BY MOUTH AT BEDTIME 30 tablet 0   ondansetron  (ZOFRAN ) 8 MG tablet Take 1 tablet (8 mg total) by mouth every 8 (eight) hours as needed for nausea or vomiting. 90 tablet 0   prochlorperazine  (COMPAZINE ) 10 MG tablet  Take 1 tablet (10 mg total) by mouth every 6 (six) hours as needed for nausea or vomiting. 30 tablet 2   No current facility-administered medications for this visit.   Facility-Administered Medications Ordered in Other Visits  Medication Dose Route Frequency Provider Last Rate Last Admin   0.9 %  sodium chloride  infusion   Intravenous Continuous Vicki Annah BROCKS, MD 10 mL/hr at 02/11/24 1056 New Bag at 02/11/24 1056  gemcitabine  (GEMZAR ) 2,242 mg in sodium chloride  0.9 % 250 mL chemo infusion  1,000 mg/m2 (Treatment Plan Recorded) Intravenous Once Rao, Archana C, MD       PACLitaxel -protein bound (ABRAXANE ) chemo infusion 300 mg  125 mg/m2 (Treatment Plan Recorded) Intravenous Once Vicki Annah BROCKS, MD        PHYSICAL EXAMINATION:   Vitals:   02/11/24 0957 02/11/24 1025  BP: (!) 141/98   Pulse: 88   Resp:  17  Temp:    SpO2:      Filed Weights   02/11/24 0940  Weight: 223 lb (101.2 kg)    LEFT LOWER extremity- Swelling and tenderness.   Physical Exam Vitals and nursing note reviewed.  HENT:     Head: Normocephalic and atraumatic.  Eyes:     Extraocular Movements: Extraocular movements intact.     Pupils: Pupils are equal, round, and reactive to light.  Cardiovascular:     Rate and Rhythm: Normal rate and regular rhythm.  Pulmonary:     Comments: Decreased breath sounds bilaterally.  Abdominal:     General: There is no distension.     Palpations: Abdomen is soft.  Musculoskeletal:        General: Normal range of motion.     Cervical back: Normal range of motion.     Right lower leg: No edema.     Left lower leg: No edema.  Skin:    General: Skin is warm.     Findings: No rash.  Neurological:     General: No focal deficit present.     Mental Status: She is alert and oriented to person, place, and time.  Psychiatric:        Mood and Affect: Mood normal.     LABORATORY DATA:  I have reviewed the data as listed Lab Results  Component Value Date   WBC 2.2 (L)  02/11/2024   HGB 13.2 02/11/2024   HCT 39.5 02/11/2024   MCV 121.2 (H) 02/11/2024   PLT 138 (L) 02/11/2024   Lab Results  Component Value Date   NEUTROABS 1.2 (L) 02/11/2024    Recent Labs    01/14/24 0844 01/28/24 0844 02/11/24 0918  NA 139 143 144  K 4.1 3.6 3.7  CL 106 107 107  CO2 23 22 22   GLUCOSE 112* 91 90  BUN 9 7 7   CREATININE 0.71 0.74 0.78  CALCIUM  8.6* 8.7* 8.8*  GFRNONAA >60 >60 >60  PROT 6.8 6.5 6.9  ALBUMIN 3.6 3.9 4.1  AST 43* 64* 59*  ALT 35 64* 34  ALKPHOS 118 112 107  BILITOT 0.5 0.4 0.5    RADIOGRAPHIC STUDIES: I have personally reviewed the radiological images as listed and agreed with the findings in the report. No results found.   ASSESSMENT & PLAN:   Intrahepatic cholangiocarcinoma (HCC) # Intrahepatic Cholangiocarcinoma, stage IV- - s/p liver biopsy on 08/26/2022 showed metastatic moderately differentiated adenocarcinoma.  IHC stain positive for CK7 and CK20.  TTF-1, PAX8, GATA3 and CDX2 are negative.  Differentials include cholangiocarcinoma and pancreatic carcinoma. -PD-L1 0%.  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 stage IV intrahepatic cholangiocarcinoma- # JULY 14th, 2025-  Innumerable metastatic lung nodules with overall increase in the size of the nodules; Ill-defined mass in the left lobe of the liver with increase in the calcifications-given the progressive disease recommend gemcitabine  Abraxane  chemotherapy [steroid sparing]. # JULY 25th, 2025- START GEM-ABRAXANE  q 2 W- with GCSF. Blood Tempus- OCT 2025-FGFR  alteration noted; IHC HER2 negative otherwise no new mutations noted.   # CT- ordered for 10/21- Overall decrease in size of the numerous bilateral solid pulmonary nodules; Slight interval decrease in size of the ill-defined partially calcified mass in the left lobe of the liver with overlying capsular retraction;  New tiny sclerotic focus in the anterior aspect of the T4 vertebral  body, nonspecific but possibly reflecting a new osseous metastasis;  Similar appearance of the sclerotic osseous metastasis in the T3 vertebral body and left T1 pedicle.   # Proceed with Gem-abraxane  treatment today.  Proceed with GCSF injection Monday for neutropenia    # weight loss/poor taste- prior  evaluation nutrition/Joli re: weight loss- currently on olanzapine - stable   #  PN- tingling/ or numbness-recommend Cold socks/ mittents-  stable   # MAY 19th, 2025-  DVT of the left lower extremity-  Eliquis  5 mg twice daily for 6 months; and then recommend 2.5 mg twice daily indefinitely given her underlying cholangiocarcinoma. Stable.   # Diarrhea: G-1- -Chronic.  Imodium/Lomotil  as needed- Stable.   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 -Improving since off steroid.  Will monitor for now- Stable.   # Hypertension: -Continue with amlodipine  5 mg daily. Stable; refilled.Stable.   # Cough: -ongoing- ? Sec to mets- Continue with Tussionex as needed.  Declines any refills at this time.  called in tessalon  pearls- Stable.   # History of sarcoidosis- -Management per pulmonary; -Never required treatment.   # IV access; port   # JRE:inwz-   # Disability: wants to go back to work- ok to check with employer   Follow up plan: Proceed with treatment today and GCSF injection Monday F/u per [facility]       Above plan of care was discussed with patient/family in detail.  My contact information was given to the patient/family.      All questions were answered. The patient knows to call the clinic with any problems, questions or concerns.  Morna Husband AGNP-C [facility] Hematology Oncology 02/11/2024

## 2024-02-11 NOTE — Patient Instructions (Signed)
 [facility] - A DEPT OF [facility] [facility]  Discharge Instructions: Thank you for choosing [facility] to provide your oncology and hematology care.  If you have a lab appointment with the Cancer Center, please go directly to the Cancer Center and check in at the registration area.  Wear comfortable clothing and clothing appropriate for easy access to any Portacath or PICC line.   We strive to give you quality time with your provider. You may need to reschedule your appointment if you arrive late (15 or more minutes).  Arriving late affects you and other patients whose appointments are after yours.  Also, if you miss three or more appointments without notifying the office, you may be dismissed from the clinic at the providers discretion.      For prescription refill requests, have your pharmacy contact our office and allow 72 hours for refills to be completed.    Today you received the following chemotherapy and/or immunotherapy agents Abraxane , Gemzar       To help prevent nausea and vomiting after your treatment, we encourage you to take your nausea medication as directed.  BELOW ARE SYMPTOMS THAT SHOULD BE REPORTED IMMEDIATELY: *FEVER GREATER THAN 100.4 F (38 C) OR HIGHER *CHILLS OR SWEATING *NAUSEA AND VOMITING THAT IS NOT CONTROLLED WITH YOUR NAUSEA MEDICATION *UNUSUAL SHORTNESS OF BREATH *UNUSUAL BRUISING OR BLEEDING *URINARY PROBLEMS (pain or burning when urinating, or frequent urination) *BOWEL PROBLEMS (unusual diarrhea, constipation, pain near the anus) TENDERNESS IN MOUTH AND THROAT WITH OR WITHOUT PRESENCE OF ULCERS (sore throat, sores in mouth, or a toothache) UNUSUAL RASH, SWELLING OR PAIN  UNUSUAL VAGINAL DISCHARGE OR ITCHING   Items with * indicate a potential emergency and should be followed up as soon as possible or go to the Emergency Department if any problems should occur.  Please show the CHEMOTHERAPY ALERT CARD or  IMMUNOTHERAPY ALERT CARD at check-in to the Emergency Department and triage nurse.  Should you have questions after your visit or need to cancel or reschedule your appointment, please contact [facility] - A DEPT OF [facility]  [phone] and follow the prompts.  Office hours are 8:00 a.m. to 4:30 p.m. Monday - Friday. Please note that voicemails left after 4:00 p.m. may not be returned until the following business day.  We are closed weekends and major holidays. You have access to a nurse at all times for urgent questions. Please call the main number to the clinic [phone] and follow the prompts.  For any non-urgent questions, you may also contact your provider using MyChart. We now offer e-Visits for anyone 69 and older to request care online for non-urgent symptoms. For details visit [email or web address].   Also download the MyChart app! Go to the app store, search MyChart, open the app, select [place], and log in with your MyChart username and password.  Paclitaxel  Nanoparticle Albumin-Bound Injection What is this medication? NANOPARTICLE ALBUMIN-BOUND PACLITAXEL  (Na no PAHR ti kuhl al BYOO muhn-bound PAK li TAX el) treats some types of cancer. It works by slowing down the growth of cancer cells. This medicine may be used for other purposes; ask your health care provider or pharmacist if you have questions. COMMON BRAND NAME(S): Abraxane  What should I tell my care team before I take this medication? They need to know if you have any of these conditions: Liver disease Low white blood cell levels An unusual or allergic reaction to  paclitaxel , albumin, other medications, foods, dyes, or preservatives If you or your partner are pregnant or trying to get pregnant Breast-feeding How should I use this medication? This medication is injected into a vein. It is given by your care team in a hospital or clinic setting. Talk to your care team about the  use of this medication in children. Special care may be needed. Overdosage: If you think you have taken too much of this medicine contact a poison control center or emergency room at once. NOTE: This medicine is only for you. Do not share this medicine with others. What if I miss a dose? Keep appointments for follow-up doses. It is important not to miss your dose. Call your care team if you are unable to keep an appointment. What may interact with this medication? Other medications may affect the way this medication works. Talk with your care team about all of the medications you take. They may suggest changes to your treatment plan to lower the risk of side effects and to make sure your medications work as intended. This list may not describe all possible interactions. Give your health care provider a list of all the medicines, herbs, non-prescription drugs, or dietary supplements you use. Also tell them if you smoke, drink alcohol, or use illegal drugs. Some items may interact with your medicine. What should I watch for while using this medication? Your condition will be monitored carefully while you are receiving this medication. You may need blood work while taking this medication. This medication may make you feel generally unwell. This is not uncommon as chemotherapy can affect healthy cells as well as cancer cells. Report any side effects. Continue your course of treatment even though you feel ill unless your care team tells you to stop. This medication can cause serious allergic reactions. To reduce the risk, your care team may give you other medications to take before receiving this one. Be sure to follow the directions from your care team. This medication may increase your risk of getting an infection. Call your care team for advice if you get a fever, chills, sore throat, or other symptoms of a cold or flu. Do not treat yourself. Try to avoid being around people who are sick. This medication  may increase your risk to bruise or bleed. Call your care team if you notice any unusual bleeding. Be careful brushing or flossing your teeth or using a toothpick because you may get an infection or bleed more easily. If you have any dental work done, tell your dentist you are receiving this medication. Talk to your care team if you or your partner may be pregnant. Serious birth defects can occur if you take this medication during pregnancy and for 6 months after the last dose. You will need a negative pregnancy test before starting this medication. Contraception is recommended while taking this medication and for 6 months after the last dose. Your care team can help you find the option that works for you. If your partner can get pregnant, use a condom during sex while taking this medication and for 3 months after the last dose. Do not breastfeed while taking this medication and for 2 weeks after the last dose. This medication may cause infertility. Talk to your care team if you are concerned about your fertility. What side effects may I notice from receiving this medication? Side effects that you should report to your care team as soon as possible: Allergic reactions--skin rash, itching, hives, swelling of  the face, lips, tongue, or throat Dry cough, shortness of breath or trouble breathing Infection--fever, chills, cough, sore throat, wounds that don't heal, pain or trouble when passing urine, general feeling of discomfort or being unwell Low red blood cell level--unusual weakness or fatigue, dizziness, headache, trouble breathing Pain, tingling, or numbness in the hands or feet Stomach pain, unusual weakness or fatigue, nausea, vomiting, diarrhea, or fever that lasts longer than expected Unusual bruising or bleeding Side effects that usually do not require medical attention (report to your care team if they continue or are bothersome): Diarrhea Fatigue Hair loss Loss of  appetite Nausea Vomiting This list may not describe all possible side effects. Call your doctor for medical advice about side effects. You may report side effects to FDA at 1-800-FDA-1088. Where should I keep my medication? This medication is given in a hospital or clinic. It will not be stored at home. NOTE: This sheet is a summary. It may not cover all possible information. If you have questions about this medicine, talk to your doctor, pharmacist, or health care provider.  2024 Elsevier/Gold Standard (2021-06-26 00:00:00) Gemcitabine  Injection What is this medication? GEMCITABINE  (jem SYE ta been) treats some types of cancer. It works by slowing down the growth of cancer cells. This medicine may be used for other purposes; ask your health care provider or pharmacist if you have questions. COMMON BRAND NAME(S): Gemzar , Infugem  What should I tell my care team before I take this medication? They need to know if you have any of these conditions: Blood disorders Infection Kidney disease Liver disease Lung or breathing disease, such as asthma or COPD Recent or ongoing radiation therapy An unusual or allergic reaction to gemcitabine , other medications, foods, dyes, or preservatives If you or your partner are pregnant or trying to get pregnant Breast-feeding How should I use this medication? This medication is injected into a vein. It is given by your care team in a hospital or clinic setting. Talk to your care team about the use of this medication in children. Special care may be needed. Overdosage: If you think you have taken too much of this medicine contact a poison control center or emergency room at once. NOTE: This medicine is only for you. Do not share this medicine with others. What if I miss a dose? Keep appointments for follow-up doses. It is important not to miss your dose. Call your care team if you are unable to keep an appointment. What may interact with this  medication? Interactions have not been studied. This list may not describe all possible interactions. Give your health care provider a list of all the medicines, herbs, non-prescription drugs, or dietary supplements you use. Also tell them if you smoke, drink alcohol, or use illegal drugs. Some items may interact with your medicine. What should I watch for while using this medication? Your condition will be monitored carefully while you are receiving this medication. This medication may make you feel generally unwell. This is not uncommon, as chemotherapy can affect healthy cells as well as cancer cells. Report any side effects. Continue your course of treatment even though you feel ill unless your care team tells you to stop. In some cases, you may be given additional medications to help with side effects. Follow all directions for their use. This medication may increase your risk of getting an infection. Call your care team for advice if you get a fever, chills, sore throat, or other symptoms of a cold or flu. Do not  treat yourself. Try to avoid being around people who are sick. This medication may increase your risk to bruise or bleed. Call your care team if you notice any unusual bleeding. Be careful brushing or flossing your teeth or using a toothpick because you may get an infection or bleed more easily. If you have any dental work done, tell your dentist you are receiving this medication. Avoid taking medications that contain aspirin, acetaminophen , ibuprofen, naproxen, or ketoprofen unless instructed by your care team. These medications may hide a fever. Talk to your care team if you or your partner wish to become pregnant or think you might be pregnant. This medication can cause serious birth defects if taken during pregnancy and for 6 months after the last dose. A negative pregnancy test is required before starting this medication. A reliable form of contraception is recommended while taking  this medication and for 6 months after the last dose. Talk to your care team about effective forms of contraception. Do not father a child while taking this medication and for 3 months after the last dose. Use a condom while having sex during this time period. Do not breastfeed while taking this medication and for at least 1 week after the last dose. This medication may cause infertility. Talk to your care team if you are concerned about your fertility. What side effects may I notice from receiving this medication? Side effects that you should report to your care team as soon as possible: Allergic reactions--skin rash, itching, hives, swelling of the face, lips, tongue, or throat Capillary leak syndrome--stomach or muscle pain, unusual weakness or fatigue, feeling faint or lightheaded, decrease in the amount of urine, swelling of the ankles, hands, or feet, trouble breathing Infection--fever, chills, cough, sore throat, wounds that don't heal, pain or trouble when passing urine, general feeling of discomfort or being unwell Liver injury--right upper belly pain, loss of appetite, nausea, light-colored stool, dark yellow or brown urine, yellowing skin or eyes, unusual weakness or fatigue Low red blood cell level--unusual weakness or fatigue, dizziness, headache, trouble breathing Lung injury--shortness of breath or trouble breathing, cough, spitting up blood, chest pain, fever Stomach pain, bloody diarrhea, pale skin, unusual weakness or fatigue, decrease in the amount of urine, which may be signs of hemolytic uremic syndrome Sudden and severe headache, confusion, change in vision, seizures, which may be signs of posterior reversible encephalopathy syndrome (PRES) Unusual bruising or bleeding Side effects that usually do not require medical attention (report to your care team if they continue or are bothersome): Diarrhea Drowsiness Hair loss Nausea Pain, redness, or swelling with sores inside the  mouth or throat Vomiting This list may not describe all possible side effects. Call your doctor for medical advice about side effects. You may report side effects to FDA at 1-800-FDA-1088. Where should I keep my medication? This medication is given in a hospital or clinic. It will not be stored at home. NOTE: This sheet is a summary. It may not cover all possible information. If you have questions about this medicine, talk to your doctor, pharmacist, or health care provider.  2024 Elsevier/Gold Standard (2021-06-17 00:00:00)

## 2024-02-11 NOTE — Progress Notes (Signed)
 No new concerns patient still upset in clinic regarding fulphia she is  stating its not covered and she was told it should be. Message sent to [facility] team

## 2024-02-14 ENCOUNTER — Inpatient Hospital Stay

## 2024-02-14 DIAGNOSIS — C221 Intrahepatic bile duct carcinoma: Secondary | ICD-10-CM | POA: Diagnosis not present

## 2024-02-14 DIAGNOSIS — Z79899 Other long term (current) drug therapy: Secondary | ICD-10-CM | POA: Diagnosis not present

## 2024-02-14 DIAGNOSIS — C7951 Secondary malignant neoplasm of bone: Secondary | ICD-10-CM | POA: Diagnosis not present

## 2024-02-14 DIAGNOSIS — C7800 Secondary malignant neoplasm of unspecified lung: Secondary | ICD-10-CM | POA: Diagnosis not present

## 2024-02-14 DIAGNOSIS — Z5111 Encounter for antineoplastic chemotherapy: Secondary | ICD-10-CM | POA: Diagnosis not present

## 2024-02-14 DIAGNOSIS — C7801 Secondary malignant neoplasm of right lung: Secondary | ICD-10-CM

## 2024-02-14 MED ORDER — PEGFILGRASTIM-JMDB 6 MG/0.6ML ~~LOC~~ SOSY
6.0000 mg | PREFILLED_SYRINGE | Freq: Once | SUBCUTANEOUS | Status: AC
  Administered 2024-02-14: 6 mg via SUBCUTANEOUS
  Filled 2024-02-14: qty 0.6

## 2024-02-20 ENCOUNTER — Other Ambulatory Visit: Payer: Self-pay | Admitting: Nurse Practitioner

## 2024-02-21 ENCOUNTER — Encounter: Payer: Self-pay | Admitting: Internal Medicine

## 2024-02-23 ENCOUNTER — Other Ambulatory Visit: Payer: Self-pay | Admitting: *Deleted

## 2024-02-23 DIAGNOSIS — C221 Intrahepatic bile duct carcinoma: Secondary | ICD-10-CM

## 2024-02-25 ENCOUNTER — Encounter: Payer: Self-pay | Admitting: Internal Medicine

## 2024-02-25 ENCOUNTER — Inpatient Hospital Stay: Attending: Internal Medicine

## 2024-02-25 ENCOUNTER — Inpatient Hospital Stay

## 2024-02-25 ENCOUNTER — Inpatient Hospital Stay: Attending: Internal Medicine | Admitting: Internal Medicine

## 2024-02-25 ENCOUNTER — Other Ambulatory Visit: Payer: Self-pay | Admitting: Internal Medicine

## 2024-02-25 VITALS — BP 128/92 | HR 93 | Temp 99.0°F

## 2024-02-25 VITALS — BP 127/89 | HR 97 | Temp 98.6°F | Resp 16 | Ht 69.0 in | Wt 221.5 lb

## 2024-02-25 DIAGNOSIS — Z86718 Personal history of other venous thrombosis and embolism: Secondary | ICD-10-CM | POA: Diagnosis not present

## 2024-02-25 DIAGNOSIS — Z79899 Other long term (current) drug therapy: Secondary | ICD-10-CM | POA: Insufficient documentation

## 2024-02-25 DIAGNOSIS — C7802 Secondary malignant neoplasm of left lung: Secondary | ICD-10-CM | POA: Diagnosis not present

## 2024-02-25 DIAGNOSIS — F419 Anxiety disorder, unspecified: Secondary | ICD-10-CM | POA: Insufficient documentation

## 2024-02-25 DIAGNOSIS — C7801 Secondary malignant neoplasm of right lung: Secondary | ICD-10-CM

## 2024-02-25 DIAGNOSIS — Z7901 Long term (current) use of anticoagulants: Secondary | ICD-10-CM | POA: Insufficient documentation

## 2024-02-25 DIAGNOSIS — C221 Intrahepatic bile duct carcinoma: Secondary | ICD-10-CM

## 2024-02-25 DIAGNOSIS — I10 Essential (primary) hypertension: Secondary | ICD-10-CM

## 2024-02-25 DIAGNOSIS — G4700 Insomnia, unspecified: Secondary | ICD-10-CM | POA: Diagnosis not present

## 2024-02-25 DIAGNOSIS — C7800 Secondary malignant neoplasm of unspecified lung: Secondary | ICD-10-CM | POA: Diagnosis not present

## 2024-02-25 DIAGNOSIS — Z5111 Encounter for antineoplastic chemotherapy: Secondary | ICD-10-CM | POA: Insufficient documentation

## 2024-02-25 LAB — CBC WITH DIFFERENTIAL/PLATELET
Abs Immature Granulocytes: 0.04 K/uL (ref 0.00–0.07)
Basophils Absolute: 0 K/uL (ref 0.0–0.1)
Basophils Relative: 0 %
Eosinophils Absolute: 0.2 K/uL (ref 0.0–0.5)
Eosinophils Relative: 3 %
HCT: 39.7 % (ref 36.0–46.0)
Hemoglobin: 13.5 g/dL (ref 12.0–15.0)
Immature Granulocytes: 1 %
Lymphocytes Relative: 13 %
Lymphs Abs: 0.7 K/uL (ref 0.7–4.0)
MCH: 40.4 pg — ABNORMAL HIGH (ref 26.0–34.0)
MCHC: 34 g/dL (ref 30.0–36.0)
MCV: 118.9 fL — ABNORMAL HIGH (ref 80.0–100.0)
Monocytes Absolute: 0.3 K/uL (ref 0.1–1.0)
Monocytes Relative: 5 %
Neutro Abs: 4 K/uL (ref 1.7–7.7)
Neutrophils Relative %: 78 %
Platelets: 186 K/uL (ref 150–400)
RBC: 3.34 MIL/uL — ABNORMAL LOW (ref 3.87–5.11)
RDW: 13.2 % (ref 11.5–15.5)
WBC: 5.2 K/uL (ref 4.0–10.5)
nRBC: 0 % (ref 0.0–0.2)

## 2024-02-25 LAB — CMP (CANCER CENTER ONLY)
ALT: 37 U/L (ref 0–44)
AST: 56 U/L — ABNORMAL HIGH (ref 15–41)
Albumin: 4.2 g/dL (ref 3.5–5.0)
Alkaline Phosphatase: 130 U/L — ABNORMAL HIGH (ref 38–126)
Anion gap: 13 (ref 5–15)
BUN: 8 mg/dL (ref 6–20)
CO2: 23 mmol/L (ref 22–32)
Calcium: 9.5 mg/dL (ref 8.9–10.3)
Chloride: 104 mmol/L (ref 98–111)
Creatinine: 0.72 mg/dL (ref 0.44–1.00)
GFR, Estimated: >60 mL/min
Glucose, Bld: 103 mg/dL — ABNORMAL HIGH (ref 70–99)
Potassium: 4.3 mmol/L (ref 3.5–5.1)
Sodium: 141 mmol/L (ref 135–145)
Total Bilirubin: 0.4 mg/dL (ref 0.0–1.2)
Total Protein: 7.3 g/dL (ref 6.5–8.1)

## 2024-02-25 MED ORDER — SODIUM CHLORIDE 0.9 % IV SOLN
1000.0000 mg/m2 | Freq: Once | INTRAVENOUS | Status: AC
  Administered 2024-02-25: 2242 mg via INTRAVENOUS
  Filled 2024-02-25: qty 58.97

## 2024-02-25 MED ORDER — PROCHLORPERAZINE MALEATE 10 MG PO TABS
10.0000 mg | ORAL_TABLET | Freq: Once | ORAL | Status: AC
  Administered 2024-02-25: 10 mg via ORAL
  Filled 2024-02-25: qty 1

## 2024-02-25 MED ORDER — SODIUM CHLORIDE 0.9 % IV SOLN
INTRAVENOUS | Status: DC
  Filled 2024-02-25: qty 250

## 2024-02-25 MED ORDER — AMLODIPINE BESYLATE 5 MG PO TABS: 5.0000 mg | ORAL_TABLET | Freq: Every day | ORAL | 1 refills | Status: AC

## 2024-02-25 MED ORDER — PACLITAXEL PROTEIN-BOUND CHEMO INJECTION 100 MG
125.0000 mg/m2 | Freq: Once | INTRAVENOUS | Status: AC
  Administered 2024-02-25: 300 mg via INTRAVENOUS
  Filled 2024-02-25: qty 60

## 2024-02-25 NOTE — Progress Notes (Signed)
 [facility] CONSULT NOTE  Patient Care Team: Justus Leita DEL, MD as PCP - General (Internal Medicine) Verdene Gills, RN as Oncology Nurse Navigator Rennie Cindy SAUNDERS, MD as Consulting Physician (Oncology) Melanee Annah BROCKS, MD as Consulting Physician (Oncology)  CHIEF COMPLAINTS/PURPOSE OF CONSULTATION: cholangiocarcinoma  Oncology History Overview Note   Cisplatin  25 mg/m/gemcitabine  1000 mg/m/Durvalumab  -completed 8 cycles on 03/04/2023 Maintenance Durvalumab /gemcitabine -started 03/26/2023   ASSESSMENT & PLAN:  Vicki Phillips 59 y.o. 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   # Intrahepatic Cholangiocarcinoma, stage IV - s/p liver biopsy on 08/26/2022 showed metastatic moderately differentiated adenocarcinoma.  IHC stain positive for CK7 and CK20.  TTF-1, PAX8, GATA3 and CDX2 are negative.  Differentials include cholangiocarcinoma and pancreatic carcinoma.    -PD-L1 0%.  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   -s/p cisplatin , gemcitabine  and Durvalumab  every 3 weeks for 8 cycles.  Then on maintenance Durvalumab  and gemcitabine  until March 2025. -With rising CA 19-9, CT chest abdomen pelvis was done which unfortunately showed progression of the lung nodules.   - Based on FGFR 2 gene rearrangement, plan was to start futibatinib  16 mg.  Patient decided to start at full dose 20 mg on 06/04/2023.  Phosphorus is elevated to 7.5.  Dosing guidelines reviewed.  Will decrease dose to 16 mg once daily.  Weekly phosphorus level check.  Started on PhosLo  2 tabs with every meals.  If phosphorus level goes below 7 in the next 2 weeks, we will continue with futibatinib  16 mg once daily.   # JUNE 2025- HOLD futibatinib  sec to myalgias/arthralgias/ocular symptoms  # # JULY 14th, 2025-  Innumerable metastatic lung nodules with overall increase in the size of the nodules;  Ill-defined mass in the left lobe of the liver with increase in the calcifications-given the progressive disease recommend gemcitabine  Abraxane  chemotherapy [steroid sparing].   # JULY 25th, 2025- Proceed with cycle #1-gemcitabine  Abraxane  every 2 weeks;  # MAY 19th, 2025- LEFT DVT- on eliquis  [email or web address] ]     Intrahepatic cholangiocarcinoma (HCC)  08/26/2022 Cancer Staging   Staging form: Intrahepatic Bile Duct, AJCC 8th Edition - Clinical stage from 08/26/2022: Stage IV (cM1) - Signed by Clista Bimler, MD on 09/03/2022 Stage prefix: Initial diagnosis   09/03/2022 Initial Diagnosis   Cholangiocarcinoma (HCC)   09/18/2022 - 04/30/2023 Chemotherapy   Patient is on Treatment Plan : BILIARY TRACT Cisplatin  + Gemcitabine  D1,8 + Durvalumab  (1500) D1 q21d / Durvalumab  (1500) q28d     09/17/2023 -  Chemotherapy   Patient is on Treatment Plan : PANCREATIC Abraxane  D1,8,15 + Gemcitabine  D1,8,15 q28d     Metastasis to lung (HCC)  09/03/2022 Initial Diagnosis   Metastasis to lung (HCC)   09/18/2022 - 04/30/2023 Chemotherapy   Patient is on Treatment Plan : BILIARY TRACT Cisplatin  + Gemcitabine  D1,8 + Durvalumab  (1500) D1 q21d / Durvalumab  (1500) q28d     09/17/2023 -  Chemotherapy   Patient is on Treatment Plan : PANCREATIC Abraxane  D1,8,15 + Gemcitabine  D1,8,15 q28d      HISTORY OF PRESENTING ILLNESS: Patient ambulating-independently. Alone.   Vicki Phillips 59 y.o.  female pleasant patient with a history of intrahepatic stage IV cholangiocarcinoma with metastasis to lungs- MAY 2025-  DVT of the left lower extremity-  Eliquis  on Gem-abraxane  q 2 weeks is here for a follow up.  Discussed the use of AI scribe software for clinical note transcription with the patient, who gave  verbal consent to proceed.  History of Present Illness   Vicki Phillips is a 59 year old female with metastatic intrahepatic cholangiocarcinoma who presents for oncology follow-up to assess disease status and treatment  response.  She is currently receiving Abraxane  for metastatic intrahepatic cholangiocarcinoma. Tumor markers have shown a fluctuating course since 2024, with initial reduction following chemotherapy, subsequent increases in the spring and summer, and more recent downward trends. She missed one cycle of chemotherapy, resulting in a four-week interval between infusions.  Following her most recent chemotherapy session, she experienced significant malaise, arthralgias, and fever, describing herself as feeling 'really sick.' She is uncertain if the fever was related to chemotherapy or another etiology. These symptoms have resolved, and she currently denies ongoing joint pain, fever, or other systemic symptoms.  She is awaiting results of recent laboratory studies, including renal and hepatic function tests. She has no new symptoms or concerns at this visit.      Review of Systems  Constitutional:  Positive for malaise/fatigue. Negative for chills, diaphoresis, fever and weight loss.  HENT:  Negative for nosebleeds and sore throat.   Eyes:  Negative for double vision.  Respiratory:  Negative for cough, hemoptysis, sputum production, shortness of breath and wheezing.   Cardiovascular:  Positive for leg swelling. Negative for chest pain, palpitations and orthopnea.  Gastrointestinal:  Negative for abdominal pain, blood in stool, constipation, diarrhea, heartburn, melena, nausea and vomiting.  Genitourinary:  Negative for dysuria, frequency and urgency.  Musculoskeletal:  Positive for back pain and joint pain.  Skin: Negative.  Negative for itching and rash.  Neurological:  Negative for dizziness, tingling, focal weakness, weakness and headaches.  Endo/Heme/Allergies:  Does not bruise/bleed easily.  Psychiatric/Behavioral:  Negative for depression. The patient is not nervous/anxious and does not have insomnia.     MEDICAL HISTORY:  Past Medical History:  Diagnosis Date   Allergy    Bile duct  cancer (HCC)    Sarcoidosis 2011    SURGICAL HISTORY: Past Surgical History:  Procedure Laterality Date   CESAREAN SECTION     x 2   COLONOSCOPY WITH PROPOFOL  N/A 08/10/2019   Procedure: COLONOSCOPY WITH PROPOFOL ;  Surgeon: Jinny Carmine, MD;  Location[facility];  Service: Endoscopy;  Laterality: N/A;  priority 4   IR IMAGING GUIDED PORT INSERTION  09/11/2022   LUNG BIOPSY      SOCIAL HISTORY: Social History   Socioeconomic History   Marital status: Single    Spouse name: Not on file   Number of children: 2   Years of education: Not on file   Highest education level: Not on file  Occupational History   Not on file  Tobacco Use   Smoking status: Never   Smokeless tobacco: Never  Vaping Use   Vaping status: Never Used  Substance and Sexual Activity   Alcohol use: Not Currently    Alcohol/week: 4.0 standard drinks of alcohol    Types: 4 Glasses of wine per week   Drug use: No   Sexual activity: Yes    Partners: Male    Birth control/protection: Surgical  Other Topics Concern   Not on file  Social History Narrative   Not on file   Social Drivers of Health   Tobacco Use: Low Risk (02/25/2024)   Patient History    Smoking Tobacco Use: Never    Smokeless Tobacco Use: Never    Passive Exposure: Not on file  Financial Resource Strain: Low Risk (09/18/2022)   Overall Financial Resource Strain (  CARDIA)    Difficulty of Paying Living Expenses: Not hard at all  Food Insecurity: No Food Insecurity (08/24/2022)   Hunger Vital Sign    Worried About Running Out of Food in the Last Year: Never true    Ran Out of Food in the Last Year: Never true  Transportation Needs: No Transportation Needs (08/24/2022)   PRAPARE - Administrator, Civil Service (Medical): No    Lack of Transportation (Non-Medical): No  Physical Activity: Not on file  Stress: Not on file  Social Connections: Not on file  Intimate Partner Violence: Not At Risk (08/24/2022)   Humiliation,  Afraid, Rape, and Kick questionnaire    Fear of Current or Ex-Partner: No    Emotionally Abused: No    Physically Abused: No    Sexually Abused: No  Depression (PHQ2-9): Medium Risk (02/25/2024)   Depression (PHQ2-9)    PHQ-2 Score: 5  Alcohol Screen: Not on file  Housing: Unknown (03/16/2023)   Received from [facility]   Epic    Unable to Pay for Housing in the Last Year: Not on file    Number of Times Moved in the Last Year: Not on file    At any time in the past 12 months, were you homeless or living in a shelter (including now)?: No  Utilities: Not At Risk (08/24/2022)   [facility] Utilities    Threatened with loss of utilities: No  Health Literacy: Adequate Health Literacy (09/18/2022)   B1300 Health Literacy    Frequency of need for help with medical instructions: Never    FAMILY HISTORY: Family History  Problem Relation Age of Onset   Stroke Mother    Heart disease Mother    Heart attack Father 75   Heart disease Father    Kidney disease Sister     ALLERGIES:  is allergic to other.  MEDICATIONS:  Current Outpatient Medications  Medication Sig Dispense Refill   apixaban  (ELIQUIS ) 5 MG TABS tablet Take 1 tablet (5 mg total) by mouth 2 (two) times daily. 60 tablet 5   benzonatate  (TESSALON ) 100 MG capsule Take 1 capsule (100 mg total) by mouth 3 (three) times daily as needed for cough. 60 capsule 0   diphenoxylate -atropine  (LOMOTIL ) 2.5-0.025 MG tablet Take 1 tablet by mouth 4 (four) times daily as needed for diarrhea or loose stools. Take it along with immodium 60 tablet 0   loratadine (CLARITIN) 10 MG tablet Take 10 mg by mouth daily.     OLANZapine  (ZYPREXA ) 10 MG tablet TAKE 1 TABLET(10 MG) BY MOUTH AT BEDTIME 30 tablet 0   ondansetron  (ZOFRAN ) 8 MG tablet Take 1 tablet (8 mg total) by mouth every 8 (eight) hours as needed for nausea or vomiting. 90 tablet 0   prochlorperazine  (COMPAZINE ) 10 MG tablet Take 1 tablet (10 mg total) by mouth every 6 (six) hours  as needed for nausea or vomiting. 30 tablet 2   amLODipine  (NORVASC ) 5 MG tablet Take 1 tablet (5 mg total) by mouth daily. 90 tablet 1   No current facility-administered medications for this visit.    PHYSICAL EXAMINATION:   Vitals:   02/25/24 0914 02/25/24 0934  BP: (!) 140/128 127/89  Pulse: 97   Resp: 16   Temp: 98.6 F (37 C)   SpO2: 96%      Filed Weights   02/25/24 0914  Weight: 221 lb 8 oz (100.5 kg)     Physical Exam Vitals and nursing note reviewed.  HENT:     Head: Normocephalic and atraumatic.     Mouth/Throat:     Pharynx: Oropharynx is clear.  Eyes:     Extraocular Movements: Extraocular movements intact.     Pupils: Pupils are equal, round, and reactive to light.  Cardiovascular:     Rate and Rhythm: Normal rate and regular rhythm.  Pulmonary:     Comments: Decreased breath sounds bilaterally.  Abdominal:     Palpations: Abdomen is soft.  Musculoskeletal:        General: Normal range of motion.     Cervical back: Normal range of motion.  Skin:    General: Skin is warm.  Neurological:     General: No focal deficit present.     Mental Status: She is alert and oriented to person, place, and time.  Psychiatric:        Behavior: Behavior normal.        Judgment: Judgment normal.     LABORATORY DATA:  I have reviewed the data as listed Lab Results  Component Value Date   WBC 5.2 02/25/2024   HGB 13.5 02/25/2024   HCT 39.7 02/25/2024   MCV 118.9 (H) 02/25/2024   PLT 186 02/25/2024   Recent Labs    01/14/24 0844 01/28/24 0844 02/11/24 0918  NA 139 143 144  K 4.1 3.6 3.7  CL 106 107 107  CO2 23 22 22   GLUCOSE 112* 91 90  BUN 9 7 7   CREATININE 0.71 0.74 0.78  CALCIUM  8.6* 8.7* 8.8*  GFRNONAA >60 >60 >60  PROT 6.8 6.5 6.9  ALBUMIN 3.6 3.9 4.1  AST 43* 64* 59*  ALT 35 64* 34  ALKPHOS 118 112 107  BILITOT 0.5 0.4 0.5    RADIOGRAPHIC STUDIES: I have personally reviewed the radiological images as listed and agreed with the  findings in the report. No results found.     Intrahepatic cholangiocarcinoma (HCC) # Intrahepatic Cholangiocarcinoma, stage IV- - s/p liver biopsy on 08/26/2022 showed metastatic moderately differentiated adenocarcinoma.  IHC stain positive for CK7 and CK20.  TTF-1, PAX8, GATA3 and CDX2 are negative.  Differentials include cholangiocarcinoma and pancreatic carcinoma. -PD-L1 0%.  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 stage IV intrahepatic cholangiocarcinoma- # JULY 14th, 2025-  Innumerable metastatic lung nodules with overall increase in the size of the nodules; Ill-defined mass in the left lobe of the liver with increase in the calcifications-given the progressive disease recommend gemcitabine  Abraxane  chemotherapy [steroid sparing]. # JULY 25th, 2025- START GEM-ABRAXANE  q 2 W- with GCSF. Blood Tempus- OCT 2025-FGFR alteration noted; IHC HER2 negative otherwise no new mutations noted.  # CT- ordered for 10/21- Overall decrease in size of the numerous bilateral solid pulmonary nodules; Slight interval decrease in size of the ill-defined partially calcified mass in the left lobe of the liver with overlying capsular retraction;  New tiny sclerotic focus in the anterior aspect of the T4 vertebral body, nonspecific but possibly reflecting a new osseous metastasis;  Similar appearance of the sclerotic osseous metastasis in the T3 vertebral body and left T1 pedicle.  # Proceed with   gemcitabine  Abraxane  every 2 weeks; Labs-CBC/chemistries were reviewed with the patient.   # weight loss/poor taste- prior  evaluation nutrition/Joli re: weight loss- currently on olanzapine - stable.   # Postchemotherapy-fever 100-question infusion reaction less likely clinically.  Other possible infectious causes-however no fevers now.  Proceed with chemo  #  PN- tingling/ or numbness-continue cold socks/ mittents-stable  #  MAY 19th, 2025-  DVT of the left lower  extremity-  Eliquis  5 mg twice daily for 6 months; and then recommend 2.5 mg twice daily indefinitely given her underlying cholangiocarcinoma. Stable.   # Diarrhea: G-1- -Chronic.  Imodium/Lomotil  as needed-  Stable.   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 -Improving since off steroid.  Will monitor for now-  Stable.  # Hypertension: -Continue with amlodipine  5 mg daily. Stable; refilled. Stable.   # Cough: -ongoing- ? Sec to mets- Continue with Tussionex as needed.  Declines any refills at this time.  Stable.   # History of sarcoidosis- -Management per pulmonary; -Never required treatment.  # IV access; port flush q 2 M  # JRE:inwz-  # Disability: wants to go back to work- ok to check with employer  PS-  # DISPOSITION:  # chemo today; D-3 injection # as planned- in 2  week-MD labs CBC CMP; CA 19-9;  chemo; D-3 injection # follow up  in 4  week-MD- labs CBC CMP; CA 19-9; chemo - ; D-3 injection; CT CAP - Dr.B    Above plan of care was discussed with patient/family in detail.  My contact information was given to the patient/family.      Cindy JONELLE Joe, MD 02/25/2024 10:13 AM

## 2024-02-25 NOTE — Assessment & Plan Note (Addendum)
#   Intrahepatic Cholangiocarcinoma, stage IV- - s/p liver biopsy on 08/26/2022 showed metastatic moderately differentiated adenocarcinoma.  IHC stain positive for CK7 and CK20.  TTF-1, PAX8, GATA3 and CDX2 are negative.  Differentials include cholangiocarcinoma and pancreatic carcinoma. -PD-L1 0%.  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 stage IV intrahepatic cholangiocarcinoma- # JULY 14th, 2025-  Innumerable metastatic lung nodules with overall increase in the size of the nodules; Ill-defined mass in the left lobe of the liver with increase in the calcifications-given the progressive disease recommend gemcitabine  Abraxane  chemotherapy [steroid sparing]. # JULY 25th, 2025- START GEM-ABRAXANE  q 2 W- with GCSF. Blood Tempus- OCT 2025-FGFR alteration noted; IHC HER2 negative otherwise no new mutations noted.  # CT- ordered for 10/21- Overall decrease in size of the numerous bilateral solid pulmonary nodules; Slight interval decrease in size of the ill-defined partially calcified mass in the left lobe of the liver with overlying capsular retraction;  New tiny sclerotic focus in the anterior aspect of the T4 vertebral body, nonspecific but possibly reflecting a new osseous metastasis;  Similar appearance of the sclerotic osseous metastasis in the T3 vertebral body and left T1 pedicle.  # Proceed with   gemcitabine  Abraxane  every 2 weeks; Labs-CBC/chemistries were reviewed with the patient.   # weight loss/poor taste- prior  evaluation nutrition/Joli re: weight loss- currently on olanzapine - stable.   # Postchemotherapy-fever 100-question infusion reaction less likely clinically.  Other possible infectious causes-however no fevers now.  Proceed with chemo  #  PN- tingling/ or numbness-continue cold socks/ mittents-stable  # MAY 19th, 2025-  DVT of the left lower extremity-  Eliquis  5 mg twice daily for 6 months; and then recommend 2.5 mg twice daily  indefinitely given her underlying cholangiocarcinoma. Stable.   # Diarrhea: G-1- -Chronic.  Imodium/Lomotil  as needed-  Stable.   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 -Improving since off steroid.  Will monitor for now-  Stable.  # Hypertension: -Continue with amlodipine  5 mg daily. Stable; refilled. Stable.   # Cough: -ongoing- ? Sec to mets- Continue with Tussionex as needed.  Declines any refills at this time.  Stable.   # History of sarcoidosis- -Management per pulmonary; -Never required treatment.  # IV access; port flush q 2 M  # JRE:inwz-  # Disability: wants to go back to work- ok to check with employer  PS-  # DISPOSITION:  # chemo today; D-3 injection # as planned- in 2  week-MD labs CBC CMP; CA 19-9;  chemo; D-3 injection # follow up  in 4  week-MD- labs CBC CMP; CA 19-9; chemo - ; D-3 injection; CT CAP - Dr.B

## 2024-02-25 NOTE — Progress Notes (Signed)
 Needs refill on amlodipine , pended.  With last tx pt states she had a fever 100.5, numbness and tingling in feet got worse.

## 2024-02-27 ENCOUNTER — Encounter: Payer: Self-pay | Admitting: Internal Medicine

## 2024-02-28 ENCOUNTER — Inpatient Hospital Stay

## 2024-02-28 ENCOUNTER — Encounter: Payer: Self-pay | Admitting: Internal Medicine

## 2024-02-28 DIAGNOSIS — C221 Intrahepatic bile duct carcinoma: Secondary | ICD-10-CM | POA: Diagnosis not present

## 2024-02-28 DIAGNOSIS — C7801 Secondary malignant neoplasm of right lung: Secondary | ICD-10-CM

## 2024-02-28 MED ORDER — PEGFILGRASTIM-JMDB 6 MG/0.6ML ~~LOC~~ SOSY
6.0000 mg | PREFILLED_SYRINGE | Freq: Once | SUBCUTANEOUS | Status: AC
  Administered 2024-02-28: 6 mg via SUBCUTANEOUS
  Filled 2024-02-28: qty 0.6

## 2024-03-10 ENCOUNTER — Inpatient Hospital Stay

## 2024-03-10 ENCOUNTER — Encounter: Payer: Self-pay | Admitting: Internal Medicine

## 2024-03-10 ENCOUNTER — Inpatient Hospital Stay: Admitting: Internal Medicine

## 2024-03-10 VITALS — BP 135/84 | HR 88

## 2024-03-10 VITALS — BP 108/83 | HR 93 | Temp 98.7°F | Resp 16 | Ht 69.0 in | Wt 221.5 lb

## 2024-03-10 DIAGNOSIS — C221 Intrahepatic bile duct carcinoma: Secondary | ICD-10-CM | POA: Diagnosis not present

## 2024-03-10 DIAGNOSIS — C7802 Secondary malignant neoplasm of left lung: Secondary | ICD-10-CM

## 2024-03-10 DIAGNOSIS — C7801 Secondary malignant neoplasm of right lung: Secondary | ICD-10-CM | POA: Diagnosis not present

## 2024-03-10 LAB — CMP (CANCER CENTER ONLY)
ALT: 57 U/L — ABNORMAL HIGH (ref 0–44)
AST: 72 U/L — ABNORMAL HIGH (ref 15–41)
Albumin: 4.2 g/dL (ref 3.5–5.0)
Alkaline Phosphatase: 140 U/L — ABNORMAL HIGH (ref 38–126)
Anion gap: 15 (ref 5–15)
BUN: 8 mg/dL (ref 6–20)
CO2: 22 mmol/L (ref 22–32)
Calcium: 9.3 mg/dL (ref 8.9–10.3)
Chloride: 106 mmol/L (ref 98–111)
Creatinine: 0.79 mg/dL (ref 0.44–1.00)
GFR, Estimated: >60 mL/min
Glucose, Bld: 87 mg/dL (ref 70–99)
Potassium: 3.6 mmol/L (ref 3.5–5.1)
Sodium: 143 mmol/L (ref 135–145)
Total Bilirubin: 0.3 mg/dL (ref 0.0–1.2)
Total Protein: 7.1 g/dL (ref 6.5–8.1)

## 2024-03-10 LAB — CBC WITH DIFFERENTIAL (CANCER CENTER ONLY)
Abs Immature Granulocytes: 0.12 K/uL — ABNORMAL HIGH (ref 0.00–0.07)
Basophils Absolute: 0 K/uL (ref 0.0–0.1)
Basophils Relative: 0 %
Eosinophils Absolute: 0.2 K/uL (ref 0.0–0.5)
Eosinophils Relative: 3 %
HCT: 37.3 % (ref 36.0–46.0)
Hemoglobin: 12.6 g/dL (ref 12.0–15.0)
Immature Granulocytes: 2 %
Lymphocytes Relative: 15 %
Lymphs Abs: 0.9 K/uL (ref 0.7–4.0)
MCH: 39.5 pg — ABNORMAL HIGH (ref 26.0–34.0)
MCHC: 33.8 g/dL (ref 30.0–36.0)
MCV: 116.9 fL — ABNORMAL HIGH (ref 80.0–100.0)
Monocytes Absolute: 0.3 K/uL (ref 0.1–1.0)
Monocytes Relative: 5 %
Neutro Abs: 4.8 K/uL (ref 1.7–7.7)
Neutrophils Relative %: 75 %
Platelet Count: 110 K/uL — ABNORMAL LOW (ref 150–400)
RBC: 3.19 MIL/uL — ABNORMAL LOW (ref 3.87–5.11)
RDW: 14.3 % (ref 11.5–15.5)
WBC Count: 6.4 K/uL (ref 4.0–10.5)
nRBC: 0 % (ref 0.0–0.2)

## 2024-03-10 MED ORDER — PROCHLORPERAZINE MALEATE 10 MG PO TABS
10.0000 mg | ORAL_TABLET | Freq: Once | ORAL | Status: AC
  Administered 2024-03-10: 10 mg via ORAL
  Filled 2024-03-10: qty 1

## 2024-03-10 MED ORDER — OLANZAPINE 10 MG PO TABS: 10.0000 mg | ORAL_TABLET | Freq: Every day | ORAL | 1 refills | Status: DC

## 2024-03-10 MED ORDER — SODIUM CHLORIDE 0.9 % IV SOLN
INTRAVENOUS | Status: DC
  Filled 2024-03-10: qty 250

## 2024-03-10 MED ORDER — PACLITAXEL PROTEIN-BOUND CHEMO INJECTION 100 MG
125.0000 mg/m2 | Freq: Once | INTRAVENOUS | Status: AC
  Administered 2024-03-10: 300 mg via INTRAVENOUS
  Filled 2024-03-10: qty 60

## 2024-03-10 MED ORDER — SODIUM CHLORIDE 0.9 % IV SOLN
1000.0000 mg/m2 | Freq: Once | INTRAVENOUS | Status: AC
  Administered 2024-03-10: 2242 mg via INTRAVENOUS
  Filled 2024-03-10: qty 58.97

## 2024-03-10 NOTE — Progress Notes (Signed)
 [facility] CONSULT NOTE  Patient Care Team: Justus Leita DEL, MD as PCP - General (Internal Medicine) Verdene Gills, RN as Oncology Nurse Navigator Rennie Cindy SAUNDERS, MD as Consulting Physician (Oncology) Melanee Annah BROCKS, MD as Consulting Physician (Oncology)  CHIEF COMPLAINTS/PURPOSE OF CONSULTATION: cholangiocarcinoma  Oncology History Overview Note  # JULY 2024- Dx: cholangiocarinoma  Cisplatin  25 mg/m/gemcitabine  1000 mg/m/Durvalumab  -completed 8 cycles on 03/04/2023 Maintenance Durvalumab /gemcitabine -started 03/26/2023   ASSESSMENT & PLAN:  Vicki Phillips 59 y.o. 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   # Intrahepatic Cholangiocarcinoma, stage IV - s/p liver biopsy on 08/26/2022 showed metastatic moderately differentiated adenocarcinoma.  IHC stain positive for CK7 and CK20.  TTF-1, PAX8, GATA3 and CDX2 are negative.  Differentials include cholangiocarcinoma and pancreatic carcinoma.    -PD-L1 0%.  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   -s/p cisplatin , gemcitabine  and Durvalumab  every 3 weeks for 8 cycles.  Then on maintenance Durvalumab  and gemcitabine  until March 2025. -With rising CA 19-9, CT chest abdomen pelvis was done which unfortunately showed progression of the lung nodules.   - Based on FGFR 2 gene rearrangement, plan was to start futibatinib  16 mg.  Patient decided to start at full dose 20 mg on 06/04/2023.  Phosphorus is elevated to 7.5.  Dosing guidelines reviewed.  Will decrease dose to 16 mg once daily.  Weekly phosphorus level check.  Started on PhosLo  2 tabs with every meals.  If phosphorus level goes below 7 in the next 2 weeks, we will continue with futibatinib  16 mg once daily.   # JUNE 2025- HOLD futibatinib  sec to myalgias/arthralgias/ocular symptoms  # # JULY 14th, 2025-  Innumerable metastatic lung nodules with overall increase  in the size of the nodules; Ill-defined mass in the left lobe of the liver with increase in the calcifications-given the progressive disease recommend gemcitabine  Abraxane  chemotherapy [steroid sparing].   # JULY 25th, 2025- Proceed with cycle #1-gemcitabine  Abraxane  every 2 weeks;  # MAY 19th, 2025- LEFT DVT- on eliquis  Vicki Phillips ]     Intrahepatic cholangiocarcinoma (HCC)  08/26/2022 Cancer Staging   Staging form: Intrahepatic Bile Duct, AJCC 8th Edition - Clinical stage from 08/26/2022: Stage IV (cM1) - Signed by Clista Bimler, MD on 09/03/2022 Stage prefix: Initial diagnosis   09/03/2022 Initial Diagnosis   Cholangiocarcinoma (HCC)   09/18/2022 - 04/30/2023 Chemotherapy   Patient is on Treatment Plan : BILIARY TRACT Cisplatin  + Gemcitabine  D1,8 + Durvalumab  (1500) D1 q21d / Durvalumab  (1500) q28d     09/17/2023 -  Chemotherapy   Patient is on Treatment Plan : PANCREATIC Abraxane  D1,8,15 + Gemcitabine  D1,8,15 q28d     Metastasis to lung (HCC)  09/03/2022 Initial Diagnosis   Metastasis to lung (HCC)   09/18/2022 - 04/30/2023 Chemotherapy   Patient is on Treatment Plan : BILIARY TRACT Cisplatin  + Gemcitabine  D1,8 + Durvalumab  (1500) D1 q21d / Durvalumab  (1500) q28d     09/17/2023 -  Chemotherapy   Patient is on Treatment Plan : PANCREATIC Abraxane  D1,8,15 + Gemcitabine  D1,8,15 q28d      HISTORY OF PRESENTING ILLNESS: Patient ambulating-independently. Alone.   Vicki Phillips 59 y.o.  female pleasant patient with a history of intrahepatic stage IV cholangiocarcinoma with metastasis to lungs- MAY 2025-  DVT of the left lower extremity-  Eliquis  on Gem-abraxane  q 2 weeks is here for a follow up.   Discussed the use of AI scribe software for clinical note  transcription with the patient, who gave verbal consent to proceed.  History of Present Illness   Vicki Phillips is a 59 year old female with metastatic intrahepatic cholangiocarcinoma who presents for oncology follow-up and  management of chemotherapy-related toxicities.  She has received eight cycles of chemotherapy for metastatic intrahepatic cholangiocarcinoma, with prior treatment at [facility] including decitabine. Disease status is monitored with tumor markers and imaging, and a CT scan is scheduled for further assessment.  She continues to experience persistent chemotherapy-induced nausea, managed with as-needed Zofran  and Compazine , and has not required refills. She continues to experience persistent chemotherapy-induced nausea, managed with as-needed Zofran  and Compazine , and has not required refills. She also reports joint pain attributed to chemotherapy.  Progressive peripheral neuropathy is a significant concern, with worsening loss of sensation in her feet now extending up to her back. She denies complete loss of sensation, falls, or motor weakness, but expresses apprehension about further progression. She is not currently using cold mittens during infusions and is reluctant to initiate pharmacologic therapy for neuropathy due to concerns about side effects and withdrawal.  Her cough has improved and she denies new or worsening respiratory symptoms.  She is currently on disability due to her cancer diagnosis and notes that her Social Security disability application was approved rapidly. She expresses frustration that her outward appearance does not reflect the severity of her illness, leading to misunderstandings at work regarding her health status.      Review of Systems  Constitutional:  Positive for malaise/fatigue. Negative for chills, diaphoresis, fever and weight loss.  HENT:  Negative for nosebleeds and sore throat.   Eyes:  Negative for double vision.  Respiratory:  Negative for cough, hemoptysis, sputum production, shortness of breath and wheezing.   Cardiovascular:  Positive for leg swelling. Negative for chest pain, palpitations and orthopnea.  Gastrointestinal:  Negative for abdominal pain, blood in  stool, constipation, diarrhea, heartburn, melena, nausea and vomiting.  Genitourinary:  Negative for dysuria, frequency and urgency.  Musculoskeletal:  Positive for back pain and joint pain.  Skin: Negative.  Negative for itching and rash.  Neurological:  Negative for dizziness, tingling, focal weakness, weakness and headaches.  Endo/Heme/Allergies:  Does not bruise/bleed easily.  Psychiatric/Behavioral:  Negative for depression. The patient is not nervous/anxious and does not have insomnia.     MEDICAL HISTORY:  Past Medical History:  Diagnosis Date   Allergy    Bile duct cancer (HCC)    Sarcoidosis 2011    SURGICAL HISTORY: Past Surgical History:  Procedure Laterality Date   CESAREAN SECTION     x 2   COLONOSCOPY WITH PROPOFOL  N/A 08/10/2019   Procedure: COLONOSCOPY WITH PROPOFOL ;  Surgeon: Jinny Carmine, MD;  Location: [facility];  Service: Endoscopy;  Laterality: N/A;  priority 4   IR IMAGING GUIDED PORT INSERTION  09/11/2022   LUNG BIOPSY      SOCIAL HISTORY: Social History   Socioeconomic History   Marital status: Single    Spouse name: Not on file   Number of children: 2   Years of education: Not on file   Highest education level: Not on file  Occupational History   Not on file  Tobacco Use   Smoking status: Never   Smokeless tobacco: Never  Vaping Use   Vaping status: Never Used  Substance and Sexual Activity   Alcohol use: Not Currently    Alcohol/week: 4.0 standard drinks of alcohol    Types: 4 Glasses of wine per week   Drug use: No  Sexual activity: Yes    Partners: Male    Birth control/protection: Surgical  Other Topics Concern   Not on file  Social History Narrative   Not on file   Social Drivers of Health   Tobacco Use: Low Risk (03/10/2024)   Patient History    Smoking Tobacco Use: Never    Smokeless Tobacco Use: Never    Passive Exposure: Not on file  Financial Resource Strain: Low Risk (09/18/2022)   Overall Financial Resource  Strain (CARDIA)    Difficulty of Paying Living Expenses: Not hard at all  Food Insecurity: No Food Insecurity (08/24/2022)   Hunger Vital Sign    Worried About Running Out of Food in the Last Year: Never true    Ran Out of Food in the Last Year: Never true  Transportation Needs: No Transportation Needs (08/24/2022)   PRAPARE - Administrator, Civil Service (Medical): No    Lack of Transportation (Non-Medical): No  Physical Activity: Not on file  Stress: Not on file  Social Connections: Not on file  Intimate Partner Violence: Not At Risk (08/24/2022)   Humiliation, Afraid, Rape, and Kick questionnaire    Fear of Current or Ex-Partner: No    Emotionally Abused: No    Physically Abused: No    Sexually Abused: No  Depression (PHQ2-9): Medium Risk (03/10/2024)   Depression (PHQ2-9)    PHQ-2 Score: 6  Alcohol Screen: Not on file  Housing: Unknown (03/16/2023)   Received from [facility]   Epic    Unable to Pay for Housing in the Last Year: Not on file    Number of Times Moved in the Last Year: Not on file    At any time in the past 12 months, were you homeless or living in a shelter (including now)?: No  Utilities: Not At Risk (08/24/2022)   [facility] Utilities    Threatened with loss of utilities: No  Health Literacy: Adequate Health Literacy (09/18/2022)   B1300 Health Literacy    Frequency of need for help with medical instructions: Never    FAMILY HISTORY: Family History  Problem Relation Age of Onset   Stroke Mother    Heart disease Mother    Heart attack Father 6   Heart disease Father    Kidney disease Sister     ALLERGIES:  is allergic to other.  MEDICATIONS:  Current Outpatient Medications  Medication Sig Dispense Refill   amLODipine  (NORVASC ) 5 MG tablet Take 1 tablet (5 mg total) by mouth daily. 90 tablet 1   apixaban  (ELIQUIS ) 5 MG TABS tablet Take 1 tablet (5 mg total) by mouth 2 (two) times daily. 60 tablet 5   benzonatate  (TESSALON ) 100 MG  capsule Take 1 capsule (100 mg total) by mouth 3 (three) times daily as needed for cough. 60 capsule 0   diphenoxylate -atropine  (LOMOTIL ) 2.5-0.025 MG tablet Take 1 tablet by mouth 4 (four) times daily as needed for diarrhea or loose stools. Take it along with immodium 60 tablet 0   loratadine (CLARITIN) 10 MG tablet Take 10 mg by mouth daily.     ondansetron  (ZOFRAN ) 8 MG tablet Take 1 tablet (8 mg total) by mouth every 8 (eight) hours as needed for nausea or vomiting. 90 tablet 0   prochlorperazine  (COMPAZINE ) 10 MG tablet Take 1 tablet (10 mg total) by mouth every 6 (six) hours as needed for nausea or vomiting. 30 tablet 2   OLANZapine  (ZYPREXA ) 10 MG tablet Take 1 tablet (  10 mg total) by mouth daily. 30 tablet 1   No current facility-administered medications for this visit.   Facility-Administered Medications Ordered in Other Visits  Medication Dose Route Frequency Provider Last Rate Last Admin   0.9 %  sodium chloride  infusion   Intravenous Continuous Lanell Jacobsen, NP       gemcitabine  (GEMZAR ) 2,242 mg in sodium chloride  0.9 % 250 mL chemo infusion  1,000 mg/m2 (Treatment Plan Recorded) Intravenous Once Lanell Jacobsen, NP       PACLitaxel -protein bound (ABRAXANE ) chemo infusion 300 mg  125 mg/m2 (Treatment Plan Recorded) Intravenous Once Perkins, Lindsey, NP       prochlorperazine  (COMPAZINE ) tablet 10 mg  10 mg Oral Once Lanell Jacobsen, NP        PHYSICAL EXAMINATION:   Vitals:   03/10/24 0838  BP: 108/83  Pulse: 93  Resp: 16  Temp: 98.7 F (37.1 C)  SpO2: 96%     Filed Weights   03/10/24 0838  Weight: 221 lb 8 oz (100.5 kg)     Physical Exam Vitals and nursing note reviewed.  HENT:     Head: Normocephalic and atraumatic.     Mouth/Throat:     Pharynx: Oropharynx is clear.  Eyes:     Extraocular Movements: Extraocular movements intact.     Pupils: Pupils are equal, round, and reactive to light.  Cardiovascular:     Rate and Rhythm: Normal rate and regular  rhythm.  Pulmonary:     Comments: Decreased breath sounds bilaterally.  Abdominal:     Palpations: Abdomen is soft.  Musculoskeletal:        General: Normal range of motion.     Cervical back: Normal range of motion.  Skin:    General: Skin is warm.  Neurological:     General: No focal deficit present.     Mental Status: She is alert and oriented to person, place, and time.  Psychiatric:        Behavior: Behavior normal.        Judgment: Judgment normal.     LABORATORY DATA:  I have reviewed the data as listed Lab Results  Component Value Date   WBC 6.4 03/10/2024   HGB 12.6 03/10/2024   HCT 37.3 03/10/2024   MCV 116.9 (H) 03/10/2024   PLT 110 (L) 03/10/2024   Recent Labs    02/11/24 0918 02/25/24 0915 03/10/24 0835  NA 144 141 143  K 3.7 4.3 3.6  CL 107 104 106  CO2 22 23 22   GLUCOSE 90 103* 87  BUN 7 8 8   CREATININE 0.78 0.72 9.20  CALCIUM  8.8* 9.5 9.3  GFRNONAA >60 >60 >60  PROT 6.9 7.3 7.1  ALBUMIN 4.1 4.2 4.2  AST 59* 56* 72*  ALT 34 37 57*  ALKPHOS 107 130* 140*  BILITOT 0.5 0.4 0.3    RADIOGRAPHIC STUDIES: I have personally reviewed the radiological images as listed and agreed with the findings in the report. No results found.     Intrahepatic cholangiocarcinoma (HCC) # Intrahepatic Cholangiocarcinoma, stage IV- - s/p liver biopsy on 08/26/2022 showed metastatic moderately differentiated adenocarcinoma.  IHC stain positive for CK7 and CK20.  TTF-1, PAX8, GATA3 and CDX2 are negative.  Differentials include cholangiocarcinoma and pancreatic carcinoma. -PD-L1 0%.  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 stage IV intrahepatic cholangiocarcinoma- # JULY 14th, 2025-  Innumerable metastatic lung nodules with overall increase in the size of the nodules; Ill-defined mass in the left  lobe of the liver with increase in the calcifications-given the progressive disease recommend gemcitabine  Abraxane  chemotherapy  [steroid sparing]. # JULY 25th, 2025- START GEM-ABRAXANE  q 2 W- with GCSF. Blood Tempus- OCT 2025-FGFR alteration noted; IHC HER2 negative otherwise no new mutations noted.  # CT- ordered for 10/21- Overall decrease in size of the numerous bilateral solid pulmonary nodules; Slight interval decrease in size of the ill-defined partially calcified mass in the left lobe of the liver with overlying capsular retraction;  New tiny sclerotic focus in the anterior aspect of the T4 vertebral body, nonspecific but possibly reflecting a new osseous metastasis;  Similar appearance of the sclerotic osseous metastasis in the T3 vertebral body and left T1 pedicle.  # Proceed with   gemcitabine  Abraxane  every 2 weeks; Labs-CBC/chemistries were reviewed with the patient. CT on 1/20-Pending.  Will await on the results of the CT scan-if progression noted consider clinical trials evaluation at [facility] versus further lines of therapies.   # weight loss/poor taste- prior  evaluation nutrition/Joli re: weight loss- currently on olanzapine - stable.   # Postchemotherapy-fever 100-question infusion reaction less likely clinically.  Other possible infectious causes-however no fevers now.  Proceed with chemo  #  PN- 1-2  tingling/ or numbness-[reluctant with gabapentin]continue cold socks/ mittents-stable  # MAY 19th, 2025-  DVT of the left lower extremity-  Eliquis  5 mg twice daily for 6 months; and then recommend 2.5 mg twice daily indefinitely given her underlying cholangiocarcinoma. Stable.   # Diarrhea: G-1- -Chronic.  Imodium/Lomotil  as needed-  Stable.   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 -Improving since off steroid.  Will monitor for now-  Stable.  # Hypertension: -Continue with amlodipine  5 mg daily. Stable; refilled. Stable.   # Cough: -ongoing- ? Sec to mets- Continue with Tussionex as needed.  Declines any refills at this time.  Stable.   # History of sarcoidosis-  -Management per pulmonary; -Never required treatment.  # IV access; port flush q 2 M  # JRE:inwz-  # Disability: wants to go back to work- ok to check with employer  PS-  # DISPOSITION:  # chemo today; D-3 injection # as planned- in 2  week-MD labs CBC CMP; CA 19-9;  chemo; D-3 injection- ; CT CAP - # follow up  in 4  week-MD- labs CBC CMP; CA 19-9; chemo - ; D-3 injection Dr.B    Above plan of care was discussed with patient/family in detail.  My contact information was given to the patient/family.      Cindy JONELLE Joe, MD 03/10/2024 9:51 AM

## 2024-03-10 NOTE — Progress Notes (Signed)
 Needs refill on olanzapine , pended.   She has had nausea and vomiting this past treatment. Appetite still at 50%, no supplement drinks.

## 2024-03-10 NOTE — Assessment & Plan Note (Addendum)
#   Intrahepatic Cholangiocarcinoma, stage IV- - s/p liver biopsy on 08/26/2022 showed metastatic moderately differentiated adenocarcinoma.  IHC stain positive for CK7 and CK20.  TTF-1, PAX8, GATA3 and CDX2 are negative.  Differentials include cholangiocarcinoma and pancreatic carcinoma. -PD-L1 0%.  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 stage IV intrahepatic cholangiocarcinoma- # JULY 14th, 2025-  Innumerable metastatic lung nodules with overall increase in the size of the nodules; Ill-defined mass in the left lobe of the liver with increase in the calcifications-given the progressive disease recommend gemcitabine  Abraxane  chemotherapy [steroid sparing]. # JULY 25th, 2025- START GEM-ABRAXANE  q 2 W- with GCSF. Blood Tempus- OCT 2025-FGFR alteration noted; IHC HER2 negative otherwise no new mutations noted.  # CT- ordered for 10/21- Overall decrease in size of the numerous bilateral solid pulmonary nodules; Slight interval decrease in size of the ill-defined partially calcified mass in the left lobe of the liver with overlying capsular retraction;  New tiny sclerotic focus in the anterior aspect of the T4 vertebral body, nonspecific but possibly reflecting a new osseous metastasis;  Similar appearance of the sclerotic osseous metastasis in the T3 vertebral body and left T1 pedicle.  # Proceed with   gemcitabine  Abraxane  every 2 weeks; Labs-CBC/chemistries were reviewed with the patient. CT on 1/20-Pending.  Will await on the results of the CT scan-if progression noted consider clinical trials evaluation at [facility] versus further lines of therapies.   # weight loss/poor taste- prior  evaluation nutrition/Joli re: weight loss- currently on olanzapine - stable.   # Postchemotherapy-fever 100-question infusion reaction less likely clinically.  Other possible infectious causes-however no fevers now.  Proceed with chemo  #  PN- 1-2  tingling/ or numbness-[reluctant  with gabapentin]continue cold socks/ mittents-stable  # MAY 19th, 2025-  DVT of the left lower extremity-  Eliquis  5 mg twice daily for 6 months; and then recommend 2.5 mg twice daily indefinitely given her underlying cholangiocarcinoma. Stable.   # Diarrhea: G-1- -Chronic.  Imodium/Lomotil  as needed-  Stable.   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 -Improving since off steroid.  Will monitor for now-  Stable.  # Hypertension: -Continue with amlodipine  5 mg daily. Stable; refilled. Stable.   # Cough: -ongoing- ? Sec to mets- Continue with Tussionex as needed.  Declines any refills at this time.  Stable.   # History of sarcoidosis- -Management per pulmonary; -Never required treatment.  # IV access; port flush q 2 M  # JRE:inwz-  # Disability: wants to go back to work- ok to check with employer  PS-  # DISPOSITION:  # chemo today; D-3 injection # as planned- in 2  week-MD labs CBC CMP; CA 19-9;  chemo; D-3 injection- ; CT CAP - # follow up  in 4  week-MD- labs CBC CMP; CA 19-9; chemo - ; D-3 injection Dr.B

## 2024-03-11 LAB — CANCER ANTIGEN 19-9: CA 19-9: 113 U/mL — ABNORMAL HIGH (ref 0–35)

## 2024-03-13 ENCOUNTER — Inpatient Hospital Stay

## 2024-03-13 DIAGNOSIS — C221 Intrahepatic bile duct carcinoma: Secondary | ICD-10-CM

## 2024-03-13 DIAGNOSIS — C7802 Secondary malignant neoplasm of left lung: Secondary | ICD-10-CM

## 2024-03-13 MED ORDER — PEGFILGRASTIM-JMDB 6 MG/0.6ML ~~LOC~~ SOSY
6.0000 mg | PREFILLED_SYRINGE | Freq: Once | SUBCUTANEOUS | Status: AC
  Administered 2024-03-13: 6 mg via SUBCUTANEOUS
  Filled 2024-03-13: qty 0.6

## 2024-03-14 ENCOUNTER — Inpatient Hospital Stay: Admission: RE | Admit: 2024-03-14 | Source: Ambulatory Visit

## 2024-03-21 ENCOUNTER — Inpatient Hospital Stay: Admission: RE | Admit: 2024-03-21 | Source: Ambulatory Visit

## 2024-03-24 ENCOUNTER — Inpatient Hospital Stay: Admitting: Hospice and Palliative Medicine

## 2024-03-24 ENCOUNTER — Other Ambulatory Visit: Payer: Self-pay | Admitting: Internal Medicine

## 2024-03-24 ENCOUNTER — Inpatient Hospital Stay

## 2024-03-24 ENCOUNTER — Inpatient Hospital Stay: Admitting: Internal Medicine

## 2024-03-24 ENCOUNTER — Encounter: Payer: Self-pay | Admitting: Internal Medicine

## 2024-03-24 VITALS — BP 127/91 | HR 100

## 2024-03-24 DIAGNOSIS — F419 Anxiety disorder, unspecified: Secondary | ICD-10-CM | POA: Diagnosis not present

## 2024-03-24 DIAGNOSIS — C221 Intrahepatic bile duct carcinoma: Secondary | ICD-10-CM

## 2024-03-24 DIAGNOSIS — C7801 Secondary malignant neoplasm of right lung: Secondary | ICD-10-CM

## 2024-03-24 LAB — CBC WITH DIFFERENTIAL (CANCER CENTER ONLY)
Abs Immature Granulocytes: 0.12 10*3/uL — ABNORMAL HIGH (ref 0.00–0.07)
Basophils Absolute: 0 10*3/uL (ref 0.0–0.1)
Basophils Relative: 0 %
Eosinophils Absolute: 0.1 10*3/uL (ref 0.0–0.5)
Eosinophils Relative: 1 %
HCT: 37 % (ref 36.0–46.0)
Hemoglobin: 12.4 g/dL (ref 12.0–15.0)
Immature Granulocytes: 2 %
Lymphocytes Relative: 10 %
Lymphs Abs: 0.7 10*3/uL (ref 0.7–4.0)
MCH: 39.5 pg — ABNORMAL HIGH (ref 26.0–34.0)
MCHC: 33.5 g/dL (ref 30.0–36.0)
MCV: 117.8 fL — ABNORMAL HIGH (ref 80.0–100.0)
Monocytes Absolute: 0.3 10*3/uL (ref 0.1–1.0)
Monocytes Relative: 4 %
Neutro Abs: 5.5 10*3/uL (ref 1.7–7.7)
Neutrophils Relative %: 83 %
Platelet Count: 134 10*3/uL — ABNORMAL LOW (ref 150–400)
RBC: 3.14 MIL/uL — ABNORMAL LOW (ref 3.87–5.11)
RDW: 16.5 % — ABNORMAL HIGH (ref 11.5–15.5)
WBC Count: 6.7 10*3/uL (ref 4.0–10.5)
nRBC: 0 % (ref 0.0–0.2)

## 2024-03-24 LAB — CMP (CANCER CENTER ONLY)
ALT: 97 U/L — ABNORMAL HIGH (ref 0–44)
AST: 142 U/L — ABNORMAL HIGH (ref 15–41)
Albumin: 4 g/dL (ref 3.5–5.0)
Alkaline Phosphatase: 142 U/L — ABNORMAL HIGH (ref 38–126)
Anion gap: 17 — ABNORMAL HIGH (ref 5–15)
BUN: 7 mg/dL (ref 6–20)
CO2: 21 mmol/L — ABNORMAL LOW (ref 22–32)
Calcium: 9 mg/dL (ref 8.9–10.3)
Chloride: 106 mmol/L (ref 98–111)
Creatinine: 0.75 mg/dL (ref 0.44–1.00)
GFR, Estimated: >60 mL/min
Glucose, Bld: 90 mg/dL (ref 70–99)
Potassium: 3.6 mmol/L (ref 3.5–5.1)
Sodium: 143 mmol/L (ref 135–145)
Total Bilirubin: 0.4 mg/dL (ref 0.0–1.2)
Total Protein: 6.9 g/dL (ref 6.5–8.1)

## 2024-03-24 MED ORDER — MIRTAZAPINE 7.5 MG PO TABS: 7.5000 mg | ORAL_TABLET | Freq: Every day | ORAL | 0 refills | Status: AC

## 2024-03-24 MED ORDER — PROCHLORPERAZINE MALEATE 10 MG PO TABS
10.0000 mg | ORAL_TABLET | Freq: Once | ORAL | Status: AC
  Administered 2024-03-24: 10 mg via ORAL
  Filled 2024-03-24: qty 1

## 2024-03-24 MED ORDER — PACLITAXEL PROTEIN-BOUND CHEMO INJECTION 100 MG
125.0000 mg/m2 | Freq: Once | INTRAVENOUS | Status: AC
  Administered 2024-03-24: 300 mg via INTRAVENOUS
  Filled 2024-03-24: qty 60

## 2024-03-24 MED ORDER — OLANZAPINE 10 MG PO TABS: 10.0000 mg | ORAL_TABLET | Freq: Every day | ORAL | 1 refills | Status: AC

## 2024-03-24 MED ORDER — SODIUM CHLORIDE 0.9 % IV SOLN
INTRAVENOUS | Status: DC
  Filled 2024-03-24: qty 250

## 2024-03-24 MED ORDER — LORAZEPAM 0.5 MG PO TABS: ORAL_TABLET | ORAL | 0 refills | Status: AC

## 2024-03-24 MED ORDER — SODIUM CHLORIDE 0.9 % IV SOLN
1000.0000 mg/m2 | Freq: Once | INTRAVENOUS | Status: AC
  Administered 2024-03-24: 2242 mg via INTRAVENOUS
  Filled 2024-03-24: qty 58.97

## 2024-03-24 NOTE — Progress Notes (Unsigned)
 [facility] Clinical Social Work  Clinical Social Work was referred by medical provider for emotional support.  Clinical Social Worker contacted patient by phone to offer support and assess for needs.  Per patient and provider, pt experiencing increased generalized anxiety / depressive symptoms. Patient expressed experiencing daily worrying with coping skills being non effective. Patient denied symptoms being related to her stage IV prognosis. Stating she is not anxious about dying, she has even thought about discontinuing treatment. Patient has pending referral for [facility] with Dr. Hisada for psychiatry consult.    CSW discussed Clinical Counseling Services available through [facility]. Patient understands focus of clinical counseling is related to adjustment to diagnosis, including conversations about prognosis / end of life.      Follow Up Plan:  Presently, patient is comfortable with being connected with external counseling due to presenting concerns. Clinical Social Work team will continue to follow patient for supplemental support around diagnosis, and to ensure she is connected with care.     Lizbeth Sprague, LCSW  Clinical Social Worker [facility]

## 2024-03-24 NOTE — Progress Notes (Signed)
 "    Palliative Medicine [facility] at [facility] [phone] [phone]   Name: Vicki Phillips Date: 03/24/2024 MRN: [id number]  DOB: 08-12-1965  Patient Care Team: Justus Leita DEL, MD as PCP - General (Internal Medicine) Verdene Gills, RN as Oncology Nurse Navigator Rennie Cindy SAUNDERS, MD as Consulting Physician (Oncology) Melanee Annah BROCKS, MD as Consulting Physician (Oncology)    REASON FOR CONSULTATION: Vicki Phillips is a 59 y.o. female with multiple medical problems including stage IV intrahepatic cholangiocarcinoma (diagnosed 08/26/2022).  Patient has had significant anxiety.  Was referred to palliative care to address goals and manage ongoing symptoms.  SOCIAL HISTORY:     reports that she has never smoked. She has never used smokeless tobacco. She reports that she does not currently use alcohol after a past usage of about 4.0 standard drinks of alcohol per week. She reports that she does not use drugs.  Patient divorced.  Lives at home with her 2 sons.  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  Is originally from the [place].  She is a Forensic Psychologist.  ADVANCE DIRECTIVES:    CODE STATUS:   PAST MEDICAL HISTORY: Past Medical History:  Diagnosis Date   Allergy    Bile duct cancer (HCC)    Sarcoidosis 2011    PAST SURGICAL HISTORY:  Past Surgical History:  Procedure Laterality Date   CESAREAN SECTION     x 2   COLONOSCOPY WITH PROPOFOL  N/A 08/10/2019   Procedure: COLONOSCOPY WITH PROPOFOL ;  Surgeon: Jinny Carmine, MD;  Location: [facility];  Service: Endoscopy;  Laterality: N/A;  priority 4   IR IMAGING GUIDED PORT INSERTION  09/11/2022   LUNG BIOPSY      HEMATOLOGY/ONCOLOGY HISTORY:  Oncology History Overview Note  # JULY 2024- Dx: cholangiocarinoma  Cisplatin  25 mg/m/gemcitabine  1000 mg/m/Durvalumab  -completed 8 cycles on 03/04/2023 Maintenance  Durvalumab /gemcitabine -started 03/26/2023   ASSESSMENT & PLAN:  Vicki Phillips 59 y.o. 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   # Intrahepatic Cholangiocarcinoma, stage IV - s/p liver biopsy on 08/26/2022 showed metastatic moderately differentiated adenocarcinoma.  IHC stain positive for CK7 and CK20.  TTF-1, PAX8, GATA3 and CDX2 are negative.  Differentials include cholangiocarcinoma and pancreatic carcinoma.    -PD-L1 0%.  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   -s/p cisplatin , gemcitabine  and Durvalumab  every 3 weeks for 8 cycles.  Then on maintenance Durvalumab  and gemcitabine  until March 2025. -With rising CA 19-9, CT chest abdomen pelvis was done which unfortunately showed progression of the lung nodules.   - Based on FGFR 2 gene rearrangement, plan was to start futibatinib  16 mg.  Patient decided to start at full dose 20 mg on 06/04/2023.  Phosphorus is elevated to 7.5.  Dosing guidelines reviewed.  Will decrease dose to 16 mg once daily.  Weekly phosphorus level check.  Started on PhosLo  2 tabs with every meals.  If phosphorus level goes below 7 in the next 2 weeks, we will continue with futibatinib  16 mg once daily.   # JUNE 2025- HOLD futibatinib  sec to myalgias/arthralgias/ocular symptoms  # # JULY 14th, 2025-  Innumerable metastatic lung nodules with overall increase in the size of the nodules; Ill-defined mass in the left lobe of the liver with increase in the calcifications-given the progressive disease recommend gemcitabine  Abraxane  chemotherapy [steroid sparing].   # JULY 25th, 2025- Proceed with cycle #  1-gemcitabine  Abraxane  every 2 weeks;  # MAY 19th, 2025- LEFT DVT- on eliquis  Vicki Phillips ]     Intrahepatic cholangiocarcinoma (HCC)  08/26/2022 Cancer Staging   Staging form: Intrahepatic Bile Duct, AJCC 8th Edition - Clinical stage from 08/26/2022: Stage IV (cM1) -  Signed by Clista Bimler, MD on 09/03/2022 Stage prefix: Initial diagnosis   09/03/2022 Initial Diagnosis   Cholangiocarcinoma (HCC)   09/18/2022 - 04/30/2023 Chemotherapy   Patient is on Treatment Plan : BILIARY TRACT Cisplatin  + Gemcitabine  D1,8 + Durvalumab  (1500) D1 q21d / Durvalumab  (1500) q28d     09/17/2023 -  Chemotherapy   Patient is on Treatment Plan : PANCREATIC Abraxane  D1,8,15 + Gemcitabine  D1,8,15 q28d     Metastasis to lung (HCC)  09/03/2022 Initial Diagnosis   Metastasis to lung (HCC)   09/18/2022 - 04/30/2023 Chemotherapy   Patient is on Treatment Plan : BILIARY TRACT Cisplatin  + Gemcitabine  D1,8 + Durvalumab  (1500) D1 q21d / Durvalumab  (1500) q28d     09/17/2023 -  Chemotherapy   Patient is on Treatment Plan : PANCREATIC Abraxane  D1,8,15 + Gemcitabine  D1,8,15 q28d       ALLERGIES:  is allergic to other.  MEDICATIONS:  Current Outpatient Medications  Medication Sig Dispense Refill   mirtazapine  (REMERON ) 7.5 MG tablet Take 1 tablet (7.5 mg total) by mouth at bedtime. 30 tablet 0   amLODipine  (NORVASC ) 5 MG tablet Take 1 tablet (5 mg total) by mouth daily. 90 tablet 1   apixaban  (ELIQUIS ) 5 MG TABS tablet Take 1 tablet (5 mg total) by mouth 2 (two) times daily. 60 tablet 5   benzonatate  (TESSALON ) 100 MG capsule Take 1 capsule (100 mg total) by mouth 3 (three) times daily as needed for cough. 60 capsule 0   diphenoxylate -atropine  (LOMOTIL ) 2.5-0.025 MG tablet Take 1 tablet by mouth 4 (four) times daily as needed for diarrhea or loose stools. Take it along with immodium 60 tablet 0   loratadine (CLARITIN) 10 MG tablet Take 10 mg by mouth daily.     LORazepam  (ATIVAN ) 0.5 MG tablet Take 1 tablet (0.5 mg total) by mouth every 8 (eight) hours as needed for anxiety/nausea/difficulty sleeping 45 tablet 0   OLANZapine  (ZYPREXA ) 10 MG tablet Take 1 tablet (10 mg total) by mouth daily. 30 tablet 1   ondansetron  (ZOFRAN ) 8 MG tablet Take 1 tablet (8 mg total) by mouth every 8 (eight)  hours as needed for nausea or vomiting. 90 tablet 0   prochlorperazine  (COMPAZINE ) 10 MG tablet Take 1 tablet (10 mg total) by mouth every 6 (six) hours as needed for nausea or vomiting. 30 tablet 2   No current facility-administered medications for this visit.   Facility-Administered Medications Ordered in Other Visits  Medication Dose Route Frequency Provider Last Rate Last Admin   0.9 %  sodium chloride  infusion   Intravenous Continuous Brahmanday, Govinda R, MD 10 mL/hr at 03/24/24 1012 New Bag at 03/24/24 1012   gemcitabine  (GEMZAR ) 2,242 mg in sodium chloride  0.9 % 250 mL chemo infusion  1,000 mg/m2 (Treatment Plan Recorded) Intravenous Once Brahmanday, Govinda R, MD        VITAL SIGNS: LMP 08/21/2014  There were no vitals filed for this visit.  Estimated body mass index is 32 kg/m as calculated from the following:   Height as of an earlier encounter on 03/24/24: 5' 9 (1.753 m).   Weight as of an earlier encounter on 03/24/24: 216 lb 11.2 oz (98.3 kg).  LABS: CBC:    Component Value  Date/Time   WBC 6.7 03/24/2024 0844   WBC 5.2 02/25/2024 0915   HGB 12.4 03/24/2024 0844   HGB 13.8 07/31/2019 1021   HCT 37.0 03/24/2024 0844   HCT 42.3 07/31/2019 1021   PLT 134 (L) 03/24/2024 0844   PLT 241 07/31/2019 1021   MCV 117.8 (H) 03/24/2024 0844   MCV 96 07/31/2019 1021   MCV 97 05/27/2013 0034   NEUTROABS 5.5 03/24/2024 0844   NEUTROABS 2.2 07/31/2019 1021   LYMPHSABS 0.7 03/24/2024 0844   LYMPHSABS 0.7 07/31/2019 1021   MONOABS 0.3 03/24/2024 0844   EOSABS 0.1 03/24/2024 0844   EOSABS 0.2 07/31/2019 1021   BASOSABS 0.0 03/24/2024 0844   BASOSABS 0.0 07/31/2019 1021   Comprehensive Metabolic Panel:    Component Value Date/Time   NA 143 03/24/2024 0844   NA 136 07/31/2019 1021   NA 141 05/27/2013 0034   K 3.6 03/24/2024 0844   K 3.3 (L) 05/27/2013 0034   CL 106 03/24/2024 0844   CL 109 (H) 05/27/2013 0034   CO2 21 (L) 03/24/2024 0844   CO2 23 05/27/2013 0034   BUN 7  03/24/2024 0844   BUN 19 07/31/2019 1021   BUN 19 (H) 05/27/2013 0034   CREATININE 0.75 03/24/2024 0844   CREATININE 1.22 05/27/2013 0034   GLUCOSE 90 03/24/2024 0844   GLUCOSE 98 05/27/2013 0034   CALCIUM  9.0 03/24/2024 0844   CALCIUM  8.2 (L) 05/27/2013 0034   AST 142 (H) 03/24/2024 0844   ALT 97 (H) 03/24/2024 0844   ALT 21 05/27/2013 0034   ALKPHOS 142 (H) 03/24/2024 0844   ALKPHOS 53 05/27/2013 0034   BILITOT 0.4 03/24/2024 0844   PROT 6.9 03/24/2024 0844   PROT 7.1 07/31/2019 1021   PROT 7.3 05/27/2013 0034   ALBUMIN 4.0 03/24/2024 0844   ALBUMIN 4.8 07/31/2019 1021   ALBUMIN 3.6 05/27/2013 0034    RADIOGRAPHIC STUDIES: No results found.  PERFORMANCE STATUS (ECOG) : 1 - Symptomatic but completely ambulatory  Review of Systems Unless otherwise noted, a complete review of systems is negative.  Physical Exam General: NAD Pulmonary: Unlabored Extremities: no edema, no joint deformities Skin: no rashes Neurological: Weakness but otherwise nonfocal  IMPRESSION: Patient with stage IV cholangiocarcinoma.  Currently on treatment with gemcitabine  and Abraxane  but concerns for possible disease progression.  She is pending repeat CT on 03/28/2024.  I was asked to discuss depression/anxiety management with patient today.  I met with patient and son.  Patient endorses history of depression and bipolar disorder, previously treated more than 20 years ago when she was living in the [place].  She denies significant mental health changes and reports stable bipolar disorder off treatment.  However, recently she has had significant anxiety and endorses panic attacks occurring almost daily.  She says that they have been triggered recently by the weather and being unable to leave her house.  She describes insomnia and difficulty initiating sleep.  She also endorses reduced appetite.  She says that she has tried deep breathing and other strategies to reduce anxiety but these have not proven  helpful.  Patient also says that she has had some thoughts of self-harm but denies having an actual plan.  She readily contracts to safety today and says that she would notify her son and healthcare team if she had persistent thoughts of self-harm.  Patient is interested in referral to psychiatry and would like counseling support as well.  I reached out to [facility] (Dr. Vickey).  Also discussed  with Nadiyah, LCSW.   Will start patient on mirtazapine  7.5 mg nightly for depression.  Dr. Rennie has prescribed lorazepam  as needed for acute anxiety.  PLAN: - Continue current scope of treatment - Start mirtazapine  7.5 mg nightly - Agree with lorazepam  as needed - Referrals to psychiatry and social work - RTC 2 weeks  Case and plan discussed with Dr. Rennie  Patient expressed understanding and was in agreement with this plan. She also understands that She can call the clinic at any time with any questions, concerns, or complaints.     Time Total: 25 minutes  Visit consisted of counseling and education dealing with the complex and emotionally intense issues of symptom management and palliative care in the setting of serious and potentially life-threatening illness.Greater than 50%  of this time was spent counseling and coordinating care related to the above assessment and plan.  Signed by: Fonda Mower, PhD, NP-C   "
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
## 2024-03-24 NOTE — Assessment & Plan Note (Addendum)
#   Intrahepatic Cholangiocarcinoma, stage IV- - s/p liver biopsy on 08/26/2022 showed metastatic moderately differentiated adenocarcinoma.  IHC stain positive for CK7 and CK20.  TTF-1, PAX8, GATA3 and CDX2 are negative.  Differentials include cholangiocarcinoma and pancreatic carcinoma. -PD-L1 0%.  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 stage IV intrahepatic cholangiocarcinoma- # JULY 14th, 2025-  Innumerable metastatic lung nodules with overall increase in the size of the nodules; Ill-defined mass in the left lobe of the liver with increase in the calcifications-given the progressive disease recommend gemcitabine  Abraxane  chemotherapy [steroid sparing]. # JULY 25th, 2025- START GEM-ABRAXANE  q 2 W- with GCSF. Blood Tempus- OCT 2025-FGFR alteration noted; IHC HER2 negative otherwise no new mutations noted.  # CT- ordered for 10/21- Overall decrease in size of the numerous bilateral solid pulmonary nodules; Slight interval decrease in size of the ill-defined partially calcified mass in the left lobe of the liver with overlying capsular retraction;  New tiny sclerotic focus in the anterior aspect of the T4 vertebral body, nonspecific but possibly reflecting a new osseous metastasis;  Similar appearance of the sclerotic osseous metastasis in the T3 vertebral body and left T1 pedicle.  # Proceed with   gemcitabine  Abraxane  every 2 weeks; Labs-CBC/chemistries were reviewed with the patient.  Discussed with the patient that given the slight elevation of the cancer markers /worsening symptoms -possible progression versus cumulative toxicity from the chemotherapy.  Await CT on 03/28/2024-pending.  Will await on the results of the CT scan-if progression noted consider clinical trials evaluation at [facility] versus further lines of therapies.   # weight loss/poor taste- prior  evaluation nutrition/Joli re: weight loss- currently on olanzapine - refilled again.  #  Anxiety/insomnia-/nausea vomiting-recommend Ativan  0.5 mg q 8 hours prn. Will refer to [facility] for further evaluation recommendations.  #  PN- 1-2  tingling/ or numbness-[reluctant with gabapentin]continue cold socks/ mittents-stable  # MAY 19th, 2025-  DVT of the left lower extremity-  Eliquis  5 mg twice daily for 6 months; and then recommend 2.5 mg twice daily indefinitely given her underlying cholangiocarcinoma. Stable.   # Diarrhea: G-1- -Chronic.  Imodium/Lomotil  as needed-  Stable.   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 -Improving since off steroid.  Will monitor for now-  Stable.  # Hypertension: -Continue with amlodipine  5 mg daily. Stable.   # Cough: -ongoing- ? Sec to mets- Continue with Tussionex as needed.  Declines any refills at this time.  Stable.   # History of sarcoidosis- -Management per pulmonary; -Never required treatment.  # IV access; port flush q 2 M  # JRE:inwz- PS-  # DISPOSITION:  #  Anxiety/insomnia- Will refer to Josh # chemo today; D-3 injection # as planned- in 2  week-MD labs CBC CMP; CA 19-9;  chemo; D-3 injection-  Dr.B

## 2024-03-24 NOTE — Patient Instructions (Signed)
 [facility] - A DEPT OF [facility]  Discharge Instructions: Thank you for choosing [facility] to provide your oncology and hematology care.  If you have a lab appointment with the Cancer Center, please go directly to the Cancer Center and check in at the registration area.  Wear comfortable clothing and clothing appropriate for easy access to any Portacath or PICC line.   We strive to give you quality time with your provider. You may need to reschedule your appointment if you arrive late (15 or more minutes).  Arriving late affects you and other patients whose appointments are after yours.  Also, if you miss three or more appointments without notifying the office, you may be dismissed from the clinic at the provider's discretion.      For prescription refill requests, have your pharmacy contact our office and allow 72 hours for refills to be completed.    Today you received the following chemotherapy and/or immunotherapy agents- abraxane, gemzar      To help prevent nausea and vomiting after your treatment, we encourage you to take your nausea medication as directed.  BELOW ARE SYMPTOMS THAT SHOULD BE REPORTED IMMEDIATELY: *FEVER GREATER THAN 100.4 F (38 C) OR HIGHER *CHILLS OR SWEATING *NAUSEA AND VOMITING THAT IS NOT CONTROLLED WITH YOUR NAUSEA MEDICATION *UNUSUAL SHORTNESS OF BREATH *UNUSUAL BRUISING OR BLEEDING *URINARY PROBLEMS (pain or burning when urinating, or frequent urination) *BOWEL PROBLEMS (unusual diarrhea, constipation, pain near the anus) TENDERNESS IN MOUTH AND THROAT WITH OR WITHOUT PRESENCE OF ULCERS (sore throat, sores in mouth, or a toothache) UNUSUAL RASH, SWELLING OR PAIN  UNUSUAL VAGINAL DISCHARGE OR ITCHING   Items with * indicate a potential emergency and should be followed up as soon as possible or go to the Emergency Department if any problems should occur.  Please show the CHEMOTHERAPY ALERT CARD or  IMMUNOTHERAPY ALERT CARD at check-in to the Emergency Department and triage nurse.  Should you have questions after your visit or need to cancel or reschedule your appointment, please contact [facility] - A DEPT OF [facility]  [phone] and follow the prompts.  Office hours are 8:00 a.m. to 4:30 p.m. Monday - Friday. Please note that voicemails left after 4:00 p.m. may not be returned until the following business day.  We are closed weekends and major holidays. You have access to a nurse at all times for urgent questions. Please call the main number to the clinic [phone] and follow the prompts.  For any non-urgent questions, you may also contact your provider using MyChart. We now offer e-Visits for anyone 38 and older to request care online for non-urgent symptoms. For details visit [email or web address].   Also download the MyChart app! Go to the app store, search "MyChart", open the app, select [place], and log in with your MyChart username and password.

## 2024-03-25 LAB — CANCER ANTIGEN 19-9: CA 19-9: 95 U/mL — ABNORMAL HIGH (ref 0–35)

## 2024-03-27 ENCOUNTER — Encounter: Payer: Self-pay | Admitting: Internal Medicine

## 2024-03-27 ENCOUNTER — Inpatient Hospital Stay

## 2024-03-28 ENCOUNTER — Inpatient Hospital Stay

## 2024-03-28 ENCOUNTER — Other Ambulatory Visit

## 2024-03-29 NOTE — Progress Notes (Signed)
 [facility] CSW Progress Note  Clinical Child Psychotherapist spoke with New Patient Team at [facility] regarding patient's referral. Patient is scheduled with their LCSW for Feb 17th. Patient is scheduled to meet with Dr. Vickey in April (earliest appointment available).     Follow Up Plan:  CSW continues to follow patient.    Lizbeth Sprague, LCSW Clinical Social Worker [facility]

## 2024-04-06 ENCOUNTER — Other Ambulatory Visit

## 2024-04-07 ENCOUNTER — Inpatient Hospital Stay

## 2024-04-07 ENCOUNTER — Inpatient Hospital Stay: Admitting: Internal Medicine

## 2024-04-07 ENCOUNTER — Inpatient Hospital Stay: Admitting: Hospice and Palliative Medicine

## 2024-04-10 ENCOUNTER — Inpatient Hospital Stay

## 2024-04-11 ENCOUNTER — Ambulatory Visit: Payer: Self-pay | Admitting: Licensed Clinical Social Worker

## 2024-05-30 ENCOUNTER — Ambulatory Visit: Payer: Self-pay | Admitting: Psychiatry

## 2025-01-16 ENCOUNTER — Ambulatory Visit (INDEPENDENT_AMBULATORY_CARE_PROVIDER_SITE_OTHER): Admitting: Nurse Practitioner
